# Patient Record
Sex: Female | Born: 1935 | Race: White | Hispanic: No | Smoking: Former smoker
Health system: Southern US, Community
[De-identification: ages and names within clinical notes are randomized; demographics above are authoritative.]

## PROBLEM LIST (undated history)

## (undated) DIAGNOSIS — I10 Essential (primary) hypertension: Secondary | ICD-10-CM

## (undated) DIAGNOSIS — I6529 Occlusion and stenosis of unspecified carotid artery: Secondary | ICD-10-CM

## (undated) DIAGNOSIS — R159 Full incontinence of feces: Secondary | ICD-10-CM

## (undated) DIAGNOSIS — H3530 Unspecified macular degeneration: Secondary | ICD-10-CM

## (undated) DIAGNOSIS — H409 Unspecified glaucoma: Secondary | ICD-10-CM

## (undated) DIAGNOSIS — D649 Anemia, unspecified: Secondary | ICD-10-CM

## (undated) DIAGNOSIS — I739 Peripheral vascular disease, unspecified: Secondary | ICD-10-CM

## (undated) DIAGNOSIS — H9190 Unspecified hearing loss, unspecified ear: Secondary | ICD-10-CM

## (undated) DIAGNOSIS — E785 Hyperlipidemia, unspecified: Secondary | ICD-10-CM

## (undated) DIAGNOSIS — M1990 Unspecified osteoarthritis, unspecified site: Secondary | ICD-10-CM

## (undated) DIAGNOSIS — M797 Fibromyalgia: Secondary | ICD-10-CM

## (undated) DIAGNOSIS — I639 Cerebral infarction, unspecified: Secondary | ICD-10-CM

## (undated) DIAGNOSIS — E039 Hypothyroidism, unspecified: Secondary | ICD-10-CM

## (undated) DIAGNOSIS — N390 Urinary tract infection, site not specified: Secondary | ICD-10-CM

## (undated) DIAGNOSIS — R351 Nocturia: Secondary | ICD-10-CM

## (undated) HISTORY — PX: APPENDECTOMY: SHX54

## (undated) HISTORY — PX: OTHER SURGICAL HISTORY: SHX169

## (undated) HISTORY — DX: Fibromyalgia: M79.7

## (undated) HISTORY — DX: Essential (primary) hypertension: I10

## (undated) HISTORY — PX: WRIST SURGERY: SHX841

## (undated) HISTORY — PX: TONSILLECTOMY: SUR1361

## (undated) HISTORY — DX: Anemia, unspecified: D64.9

## (undated) HISTORY — DX: Urinary tract infection, site not specified: N39.0

## (undated) HISTORY — DX: Cerebral infarction, unspecified: I63.9

## (undated) HISTORY — PX: CAROTID ENDARTERECTOMY: SUR193

## (undated) HISTORY — DX: Unspecified osteoarthritis, unspecified site: M19.90

## (undated) HISTORY — PX: ESOPHAGOGASTRODUODENOSCOPY: SHX1529

## (undated) HISTORY — DX: Unspecified glaucoma: H40.9

## (undated) HISTORY — DX: Unspecified hearing loss, unspecified ear: H91.90

## (undated) HISTORY — PX: COLONOSCOPY: SHX174

## (undated) HISTORY — DX: Hypothyroidism, unspecified: E03.9

## (undated) HISTORY — DX: Occlusion and stenosis of unspecified carotid artery: I65.29

## (undated) HISTORY — DX: Full incontinence of feces: R15.9

## (undated) HISTORY — DX: Unspecified macular degeneration: H35.30

## (undated) HISTORY — PX: TUBAL LIGATION: SHX77

---

## 2010-09-29 LAB — BASIC METABOLIC PANEL
Potassium: 4.3 mmol/L (ref 3.4–5.3)
Sodium: 140 mmol/L (ref 137–147)

## 2010-10-13 DIAGNOSIS — R072 Precordial pain: Secondary | ICD-10-CM

## 2010-10-14 DIAGNOSIS — R079 Chest pain, unspecified: Secondary | ICD-10-CM

## 2010-10-20 DIAGNOSIS — R072 Precordial pain: Secondary | ICD-10-CM

## 2010-10-24 LAB — CBC AND DIFFERENTIAL
HCT: 33 % — AB (ref 36–46)
Hemoglobin: 10.8 g/dL — AB (ref 12.0–16.0)
Platelets: 49 10*3/uL — AB (ref 150–399)

## 2010-10-24 LAB — HEPATIC FUNCTION PANEL: AST: 81 U/L — AB (ref 13–35)

## 2010-10-31 ENCOUNTER — Encounter (INDEPENDENT_AMBULATORY_CARE_PROVIDER_SITE_OTHER): Payer: Self-pay

## 2010-10-31 ENCOUNTER — Encounter (INDEPENDENT_AMBULATORY_CARE_PROVIDER_SITE_OTHER): Payer: Self-pay | Admitting: *Deleted

## 2010-11-09 ENCOUNTER — Ambulatory Visit (INDEPENDENT_AMBULATORY_CARE_PROVIDER_SITE_OTHER): Payer: Federal, State, Local not specified - PPO | Admitting: Internal Medicine

## 2010-11-28 ENCOUNTER — Encounter: Payer: Federal, State, Local not specified - PPO | Admitting: Cardiology

## 2011-08-24 ENCOUNTER — Other Ambulatory Visit: Payer: Self-pay | Admitting: Ophthalmology

## 2011-08-24 NOTE — H&P (Signed)
SUBJECTIVE:  Patient is having difficulty seeing. Right eye worse than left.  OBJECTIVE:   HISTORY OF PRESENT ILLNESS:  Onset: years  Duration:  years  Quality: worse  Location:  right > left  Intensity / Severity: moderate  Context / When:  just happened  Mental Status: Oriented to Time, Person, Place   REVIEW OF SYSTEMS:   HYPOTHYROID  GEN: Denies weight changes, appetite changes, unusual weakness, bleeding, fever, chills, recent trauma or infections  HEENT: Denies vision changes (except as noted above), hearing changes, epistaxis, unusual sneezing, sore throat, swallowing difficulties, ear pain, or facial pain.  NECK: Denies neck pain swelling or stiffness.  LUNGS: Denies cough, dyspnea, orthopnea, or hemoptosis.  HEART: Denies palpitations or chest pain.  ABD: Denies abdomen pain, eructation, nausea, vomiting, hematemesis, diarrhea, constipation, hematochezia, melena, acholic stools, or flatulence.  GENT: Denies recent dysruia, urine frequency, urine hesitancy, urine urgency, urine flow-slow, urine retention, nocturia, polyuria, dark urine, or incontinence.  BJE: Denies arthralgia, joint stiffness, back pain, muscle cramps, or myalgia. FIBROMYALGIA  SKIN: Denies rashes, lesions, anhidrosis, bruising, or pruritus.  NEURO: Denies memory loss, disorientation, syncope, diplopia, dizziness, vertigo, clumsiness, paresthesias or cephalgia.     Vision:   OD: Acuity: With Correction 20/70 PinHole: No Improvement  OS: Acuity: With Correction 20/50 PinHole: +2    Habitual Prescription: OD: none OS:  AutoRefraction: OD:  +4.50 + 0.75 x 10         43.50  44.00 OS:  +2.25 + 2.50 x 161       43.75  44.25  Refraction;;08/24/2011 15:49  OD: +4.25 sph...............................20/40  OS: +2.00 1.50 x 165.....................20/40   Refraction:11.15.2012 OD: +3.75+0.75x015...20/25 OS  +2.50+1.00x165...20/30 Ass:+3.00  [place] Visual Fields: OD: Visual  Fields:  Arcuate Defect Stable OS: Visual Fields: SUperior Arcuate Defect Stable  Lids and Lashes:    Motility: Full     SLIT-LAMP EXAMINATION  Conjunctiva OD: Conjunctiva:  1+ Injection  OS: Conjunctiva:  1+ Injection   Pupils: OD: Pupils:     millimeters (-) Page Spiro OS: Pupils:    millimeters (-) Page Spiro  Iris: OD: Iris:  Grey with peripapillary  transillumination defects OS: Iris:  Grey with peripapillary transillumination defects  Cornea: OD:  Cornea:  OS: Cornea:   Anterior Chamber: OD: Anterior Chamber Deep and Quiet  OS: Anterior Chamber Deep and Quiet   Lens: OD: 3+ Nuclear Sclerotic  OS: 1+ Nuclear Sclerotic  +1 Posterior Sclerotic Subcapsular Cataract    Intra-Ocular Pressures in mm of Mercury:  Target:14/15 Today: OD:14 OS:14 Time:07/18/2011 11:30   Central Corneal Thickness:   Gonio: OD: Gonio  OS: Gonio     Dilation :   Condensing Lens(es) Used:   Vitreous: OD: Vitreous:  Clear OS: Vitreous:  Clear  Optic Discs: OD: 80% Cupping  OS: Optic Discs   Macula: OD: Macula: Clear Pigment Mottling OS: Macula: Clear  Retina And Vessels: OD: Retina and Vessels Clear OS: Retina and Vessels Clear  Periphery: OD: Periphery Clear OS: Periphery  Clear     HEALTH AND PHYSICAL  Blood Pressure:  140/64  Pulse:  70  Respiration:  20  GENERAL: No acute distress  HEENT: MM's WNL, TM's normal, PERLA, Conjunctiva WNL, Fundi  As Before  note above  NECK: Full ROM, no adenopathy or masses, Thyroid WNL  LUNGS: Clear to auscultation, equal BS  HEART: RR&R, no murmur, gallop, or rub, not enlarged  ABD: Normal  GENT: deferred  BJE: normal  NEURO: CN II through XII intact  SKIN: normal  STUDIES:  A-Scan      ASSESSMENT: Vision worse OD Discussed possibility of  Age-Related Macular Degeneration affecting vision after Cataracts #366.9.  removal.  Risks and Benefits reviewed with patient and she agreed to proceed.     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 OS  PLAN:  Dilation today  phacoemulsification with intraocular lens implant OD

## 2011-08-25 ENCOUNTER — Encounter (HOSPITAL_COMMUNITY): Payer: Self-pay | Admitting: Pharmacy Technician

## 2011-08-25 ENCOUNTER — Other Ambulatory Visit: Payer: Self-pay | Admitting: Ophthalmology

## 2011-08-30 ENCOUNTER — Ambulatory Visit (HOSPITAL_COMMUNITY)
Admission: RE | Admit: 2011-08-30 | Payer: Federal, State, Local not specified - PPO | Source: Ambulatory Visit | Admitting: Ophthalmology

## 2011-08-30 ENCOUNTER — Encounter (HOSPITAL_COMMUNITY): Admission: RE | Payer: Self-pay | Source: Ambulatory Visit

## 2011-08-30 SURGERY — PHACOEMULSIFICATION, CATARACT, WITH IOL INSERTION
Anesthesia: Monitor Anesthesia Care | Laterality: Right

## 2011-09-22 ENCOUNTER — Encounter (INDEPENDENT_AMBULATORY_CARE_PROVIDER_SITE_OTHER): Payer: Federal, State, Local not specified - PPO | Admitting: Ophthalmology

## 2011-10-12 ENCOUNTER — Encounter (INDEPENDENT_AMBULATORY_CARE_PROVIDER_SITE_OTHER): Payer: Federal, State, Local not specified - PPO | Admitting: Ophthalmology

## 2011-10-12 DIAGNOSIS — H3530 Unspecified macular degeneration: Secondary | ICD-10-CM

## 2011-10-12 DIAGNOSIS — H43819 Vitreous degeneration, unspecified eye: Secondary | ICD-10-CM

## 2011-10-12 DIAGNOSIS — H353290 Exudative age-related macular degeneration, unspecified eye, stage unspecified: Secondary | ICD-10-CM

## 2011-10-12 DIAGNOSIS — I10 Essential (primary) hypertension: Secondary | ICD-10-CM

## 2011-10-12 DIAGNOSIS — H35039 Hypertensive retinopathy, unspecified eye: Secondary | ICD-10-CM

## 2011-10-13 ENCOUNTER — Encounter (INDEPENDENT_AMBULATORY_CARE_PROVIDER_SITE_OTHER): Payer: Federal, State, Local not specified - PPO | Admitting: Ophthalmology

## 2011-10-13 DIAGNOSIS — H3530 Unspecified macular degeneration: Secondary | ICD-10-CM

## 2011-10-13 DIAGNOSIS — H353290 Exudative age-related macular degeneration, unspecified eye, stage unspecified: Secondary | ICD-10-CM

## 2011-10-17 ENCOUNTER — Ambulatory Visit (INDEPENDENT_AMBULATORY_CARE_PROVIDER_SITE_OTHER): Payer: Federal, State, Local not specified - PPO | Admitting: Ophthalmology

## 2011-10-17 DIAGNOSIS — I10 Essential (primary) hypertension: Secondary | ICD-10-CM

## 2011-10-17 DIAGNOSIS — H353290 Exudative age-related macular degeneration, unspecified eye, stage unspecified: Secondary | ICD-10-CM

## 2011-10-17 DIAGNOSIS — H3530 Unspecified macular degeneration: Secondary | ICD-10-CM

## 2011-10-17 DIAGNOSIS — H2510 Age-related nuclear cataract, unspecified eye: Secondary | ICD-10-CM

## 2011-10-17 DIAGNOSIS — H43819 Vitreous degeneration, unspecified eye: Secondary | ICD-10-CM

## 2011-10-17 DIAGNOSIS — H35039 Hypertensive retinopathy, unspecified eye: Secondary | ICD-10-CM

## 2011-11-10 ENCOUNTER — Encounter (INDEPENDENT_AMBULATORY_CARE_PROVIDER_SITE_OTHER): Payer: Federal, State, Local not specified - PPO | Admitting: Ophthalmology

## 2011-11-15 ENCOUNTER — Encounter (INDEPENDENT_AMBULATORY_CARE_PROVIDER_SITE_OTHER): Payer: Federal, State, Local not specified - PPO | Admitting: Ophthalmology

## 2011-11-15 DIAGNOSIS — H35039 Hypertensive retinopathy, unspecified eye: Secondary | ICD-10-CM

## 2011-11-15 DIAGNOSIS — I10 Essential (primary) hypertension: Secondary | ICD-10-CM

## 2011-11-15 DIAGNOSIS — H3530 Unspecified macular degeneration: Secondary | ICD-10-CM

## 2011-11-15 DIAGNOSIS — H353290 Exudative age-related macular degeneration, unspecified eye, stage unspecified: Secondary | ICD-10-CM

## 2011-11-15 DIAGNOSIS — H43819 Vitreous degeneration, unspecified eye: Secondary | ICD-10-CM

## 2011-12-20 ENCOUNTER — Encounter (INDEPENDENT_AMBULATORY_CARE_PROVIDER_SITE_OTHER): Payer: Federal, State, Local not specified - PPO | Admitting: Ophthalmology

## 2011-12-20 DIAGNOSIS — H3530 Unspecified macular degeneration: Secondary | ICD-10-CM

## 2011-12-20 DIAGNOSIS — I10 Essential (primary) hypertension: Secondary | ICD-10-CM

## 2011-12-20 DIAGNOSIS — H35039 Hypertensive retinopathy, unspecified eye: Secondary | ICD-10-CM

## 2011-12-20 DIAGNOSIS — H353290 Exudative age-related macular degeneration, unspecified eye, stage unspecified: Secondary | ICD-10-CM

## 2012-02-07 ENCOUNTER — Encounter (INDEPENDENT_AMBULATORY_CARE_PROVIDER_SITE_OTHER): Payer: Federal, State, Local not specified - PPO | Admitting: Ophthalmology

## 2012-02-07 DIAGNOSIS — H43819 Vitreous degeneration, unspecified eye: Secondary | ICD-10-CM

## 2012-02-07 DIAGNOSIS — H3530 Unspecified macular degeneration: Secondary | ICD-10-CM

## 2012-02-07 DIAGNOSIS — H353290 Exudative age-related macular degeneration, unspecified eye, stage unspecified: Secondary | ICD-10-CM

## 2012-02-07 DIAGNOSIS — I10 Essential (primary) hypertension: Secondary | ICD-10-CM

## 2012-02-07 DIAGNOSIS — H35039 Hypertensive retinopathy, unspecified eye: Secondary | ICD-10-CM

## 2012-02-08 NOTE — Addendum Note (Signed)
Addended by: Blake Divine on: 02/08/2012 08:19 PM   Modules accepted: Orders

## 2012-04-03 ENCOUNTER — Encounter (INDEPENDENT_AMBULATORY_CARE_PROVIDER_SITE_OTHER): Payer: Federal, State, Local not specified - PPO | Admitting: Ophthalmology

## 2012-04-05 ENCOUNTER — Encounter (INDEPENDENT_AMBULATORY_CARE_PROVIDER_SITE_OTHER): Payer: Federal, State, Local not specified - PPO | Admitting: Ophthalmology

## 2012-04-05 DIAGNOSIS — H35039 Hypertensive retinopathy, unspecified eye: Secondary | ICD-10-CM

## 2012-04-05 DIAGNOSIS — H3530 Unspecified macular degeneration: Secondary | ICD-10-CM

## 2012-04-05 DIAGNOSIS — I10 Essential (primary) hypertension: Secondary | ICD-10-CM

## 2012-04-05 DIAGNOSIS — H353290 Exudative age-related macular degeneration, unspecified eye, stage unspecified: Secondary | ICD-10-CM

## 2012-05-01 ENCOUNTER — Other Ambulatory Visit (INDEPENDENT_AMBULATORY_CARE_PROVIDER_SITE_OTHER): Payer: Federal, State, Local not specified - PPO | Admitting: Ophthalmology

## 2012-05-01 DIAGNOSIS — H35059 Retinal neovascularization, unspecified, unspecified eye: Secondary | ICD-10-CM

## 2012-05-08 ENCOUNTER — Encounter (INDEPENDENT_AMBULATORY_CARE_PROVIDER_SITE_OTHER): Payer: Federal, State, Local not specified - PPO | Admitting: Ophthalmology

## 2012-05-08 DIAGNOSIS — H35059 Retinal neovascularization, unspecified, unspecified eye: Secondary | ICD-10-CM

## 2012-05-29 ENCOUNTER — Encounter (INDEPENDENT_AMBULATORY_CARE_PROVIDER_SITE_OTHER): Payer: BC Managed Care – PPO | Admitting: Ophthalmology

## 2012-05-29 DIAGNOSIS — H35039 Hypertensive retinopathy, unspecified eye: Secondary | ICD-10-CM

## 2012-05-29 DIAGNOSIS — I10 Essential (primary) hypertension: Secondary | ICD-10-CM

## 2012-05-29 DIAGNOSIS — H35059 Retinal neovascularization, unspecified, unspecified eye: Secondary | ICD-10-CM

## 2012-05-29 DIAGNOSIS — H2510 Age-related nuclear cataract, unspecified eye: Secondary | ICD-10-CM

## 2012-05-29 DIAGNOSIS — H353290 Exudative age-related macular degeneration, unspecified eye, stage unspecified: Secondary | ICD-10-CM

## 2012-05-29 DIAGNOSIS — H43819 Vitreous degeneration, unspecified eye: Secondary | ICD-10-CM

## 2012-06-07 ENCOUNTER — Encounter (INDEPENDENT_AMBULATORY_CARE_PROVIDER_SITE_OTHER): Payer: BC Managed Care – PPO | Admitting: Ophthalmology

## 2012-06-07 DIAGNOSIS — H43819 Vitreous degeneration, unspecified eye: Secondary | ICD-10-CM

## 2012-06-07 DIAGNOSIS — I10 Essential (primary) hypertension: Secondary | ICD-10-CM

## 2012-06-07 DIAGNOSIS — H35039 Hypertensive retinopathy, unspecified eye: Secondary | ICD-10-CM

## 2012-06-07 DIAGNOSIS — H353290 Exudative age-related macular degeneration, unspecified eye, stage unspecified: Secondary | ICD-10-CM

## 2012-07-24 ENCOUNTER — Encounter (INDEPENDENT_AMBULATORY_CARE_PROVIDER_SITE_OTHER): Payer: BC Managed Care – PPO | Admitting: Ophthalmology

## 2012-07-24 DIAGNOSIS — H353290 Exudative age-related macular degeneration, unspecified eye, stage unspecified: Secondary | ICD-10-CM

## 2012-07-24 DIAGNOSIS — H43819 Vitreous degeneration, unspecified eye: Secondary | ICD-10-CM

## 2012-07-24 DIAGNOSIS — H2510 Age-related nuclear cataract, unspecified eye: Secondary | ICD-10-CM

## 2012-09-18 ENCOUNTER — Encounter (INDEPENDENT_AMBULATORY_CARE_PROVIDER_SITE_OTHER): Payer: BC Managed Care – PPO | Admitting: Ophthalmology

## 2012-09-18 DIAGNOSIS — H353290 Exudative age-related macular degeneration, unspecified eye, stage unspecified: Secondary | ICD-10-CM

## 2012-09-18 DIAGNOSIS — H2510 Age-related nuclear cataract, unspecified eye: Secondary | ICD-10-CM

## 2012-09-18 DIAGNOSIS — H43819 Vitreous degeneration, unspecified eye: Secondary | ICD-10-CM

## 2012-09-20 ENCOUNTER — Encounter (INDEPENDENT_AMBULATORY_CARE_PROVIDER_SITE_OTHER): Payer: BC Managed Care – PPO | Admitting: Ophthalmology

## 2012-11-13 ENCOUNTER — Encounter (INDEPENDENT_AMBULATORY_CARE_PROVIDER_SITE_OTHER): Payer: BC Managed Care – PPO | Admitting: Ophthalmology

## 2012-11-13 DIAGNOSIS — H2510 Age-related nuclear cataract, unspecified eye: Secondary | ICD-10-CM

## 2012-11-13 DIAGNOSIS — H353290 Exudative age-related macular degeneration, unspecified eye, stage unspecified: Secondary | ICD-10-CM

## 2012-11-13 DIAGNOSIS — H43819 Vitreous degeneration, unspecified eye: Secondary | ICD-10-CM

## 2013-01-15 ENCOUNTER — Encounter (INDEPENDENT_AMBULATORY_CARE_PROVIDER_SITE_OTHER): Payer: BC Managed Care – PPO | Admitting: Ophthalmology

## 2013-01-15 DIAGNOSIS — E11319 Type 2 diabetes mellitus with unspecified diabetic retinopathy without macular edema: Secondary | ICD-10-CM

## 2013-01-15 DIAGNOSIS — H353290 Exudative age-related macular degeneration, unspecified eye, stage unspecified: Secondary | ICD-10-CM

## 2013-01-15 DIAGNOSIS — E1139 Type 2 diabetes mellitus with other diabetic ophthalmic complication: Secondary | ICD-10-CM

## 2013-01-15 DIAGNOSIS — H43819 Vitreous degeneration, unspecified eye: Secondary | ICD-10-CM

## 2013-03-19 ENCOUNTER — Encounter (INDEPENDENT_AMBULATORY_CARE_PROVIDER_SITE_OTHER): Payer: BC Managed Care – PPO | Admitting: Ophthalmology

## 2013-03-19 DIAGNOSIS — H353290 Exudative age-related macular degeneration, unspecified eye, stage unspecified: Secondary | ICD-10-CM

## 2013-03-19 DIAGNOSIS — E1139 Type 2 diabetes mellitus with other diabetic ophthalmic complication: Secondary | ICD-10-CM

## 2013-03-19 DIAGNOSIS — H43819 Vitreous degeneration, unspecified eye: Secondary | ICD-10-CM

## 2013-03-19 DIAGNOSIS — I10 Essential (primary) hypertension: Secondary | ICD-10-CM

## 2013-03-19 DIAGNOSIS — E11319 Type 2 diabetes mellitus with unspecified diabetic retinopathy without macular edema: Secondary | ICD-10-CM

## 2013-03-19 DIAGNOSIS — H35039 Hypertensive retinopathy, unspecified eye: Secondary | ICD-10-CM

## 2013-03-19 DIAGNOSIS — E1165 Type 2 diabetes mellitus with hyperglycemia: Secondary | ICD-10-CM

## 2013-05-21 ENCOUNTER — Encounter (INDEPENDENT_AMBULATORY_CARE_PROVIDER_SITE_OTHER): Payer: BC Managed Care – PPO | Admitting: Ophthalmology

## 2013-05-21 DIAGNOSIS — H353290 Exudative age-related macular degeneration, unspecified eye, stage unspecified: Secondary | ICD-10-CM

## 2013-05-21 DIAGNOSIS — H35039 Hypertensive retinopathy, unspecified eye: Secondary | ICD-10-CM

## 2013-05-21 DIAGNOSIS — H43819 Vitreous degeneration, unspecified eye: Secondary | ICD-10-CM

## 2013-05-21 DIAGNOSIS — E11319 Type 2 diabetes mellitus with unspecified diabetic retinopathy without macular edema: Secondary | ICD-10-CM

## 2013-05-21 DIAGNOSIS — E1165 Type 2 diabetes mellitus with hyperglycemia: Secondary | ICD-10-CM

## 2013-05-21 DIAGNOSIS — E1139 Type 2 diabetes mellitus with other diabetic ophthalmic complication: Secondary | ICD-10-CM

## 2013-05-21 DIAGNOSIS — I10 Essential (primary) hypertension: Secondary | ICD-10-CM

## 2013-07-30 ENCOUNTER — Encounter (INDEPENDENT_AMBULATORY_CARE_PROVIDER_SITE_OTHER): Payer: BC Managed Care – PPO | Admitting: Ophthalmology

## 2013-07-30 DIAGNOSIS — E1139 Type 2 diabetes mellitus with other diabetic ophthalmic complication: Secondary | ICD-10-CM

## 2013-07-30 DIAGNOSIS — H43819 Vitreous degeneration, unspecified eye: Secondary | ICD-10-CM

## 2013-07-30 DIAGNOSIS — I10 Essential (primary) hypertension: Secondary | ICD-10-CM

## 2013-07-30 DIAGNOSIS — E11319 Type 2 diabetes mellitus with unspecified diabetic retinopathy without macular edema: Secondary | ICD-10-CM

## 2013-07-30 DIAGNOSIS — H35039 Hypertensive retinopathy, unspecified eye: Secondary | ICD-10-CM

## 2013-07-30 DIAGNOSIS — H353290 Exudative age-related macular degeneration, unspecified eye, stage unspecified: Secondary | ICD-10-CM

## 2013-07-30 DIAGNOSIS — E1165 Type 2 diabetes mellitus with hyperglycemia: Secondary | ICD-10-CM

## 2013-09-10 ENCOUNTER — Encounter (INDEPENDENT_AMBULATORY_CARE_PROVIDER_SITE_OTHER): Payer: BC Managed Care – PPO | Admitting: Ophthalmology

## 2013-09-10 DIAGNOSIS — I10 Essential (primary) hypertension: Secondary | ICD-10-CM

## 2013-09-10 DIAGNOSIS — H43819 Vitreous degeneration, unspecified eye: Secondary | ICD-10-CM

## 2013-09-10 DIAGNOSIS — H353290 Exudative age-related macular degeneration, unspecified eye, stage unspecified: Secondary | ICD-10-CM

## 2013-09-10 DIAGNOSIS — H35039 Hypertensive retinopathy, unspecified eye: Secondary | ICD-10-CM

## 2013-10-04 HISTORY — PX: TOTAL HIP ARTHROPLASTY: SHX124

## 2013-10-16 ENCOUNTER — Encounter: Payer: Self-pay | Admitting: Physical Medicine and Rehabilitation

## 2013-10-16 ENCOUNTER — Other Ambulatory Visit: Payer: Self-pay | Admitting: Physical Medicine and Rehabilitation

## 2013-10-17 ENCOUNTER — Encounter (HOSPITAL_COMMUNITY): Payer: Self-pay | Admitting: Physical Medicine and Rehabilitation

## 2013-10-17 ENCOUNTER — Inpatient Hospital Stay (HOSPITAL_COMMUNITY)
Admission: RE | Admit: 2013-10-17 | Discharge: 2013-10-30 | DRG: 945 | Disposition: A | Discharge status: Home Health | Payer: Medicare Other | Source: Other Acute Inpatient Hospital | Attending: Physical Medicine & Rehabilitation | Admitting: Physical Medicine & Rehabilitation

## 2013-10-17 ENCOUNTER — Other Ambulatory Visit: Payer: Self-pay | Admitting: Physical Medicine and Rehabilitation

## 2013-10-17 ENCOUNTER — Encounter: Payer: Self-pay | Admitting: *Deleted

## 2013-10-17 DIAGNOSIS — Z96649 Presence of unspecified artificial hip joint: Secondary | ICD-10-CM

## 2013-10-17 DIAGNOSIS — H409 Unspecified glaucoma: Secondary | ICD-10-CM | POA: Diagnosis present

## 2013-10-17 DIAGNOSIS — W108XXA Fall (on) (from) other stairs and steps, initial encounter: Secondary | ICD-10-CM | POA: Diagnosis present

## 2013-10-17 DIAGNOSIS — S72309A Unspecified fracture of shaft of unspecified femur, initial encounter for closed fracture: Secondary | ICD-10-CM | POA: Diagnosis present

## 2013-10-17 DIAGNOSIS — E871 Hypo-osmolality and hyponatremia: Secondary | ICD-10-CM | POA: Diagnosis present

## 2013-10-17 DIAGNOSIS — Z79899 Other long term (current) drug therapy: Secondary | ICD-10-CM

## 2013-10-17 DIAGNOSIS — Z7982 Long term (current) use of aspirin: Secondary | ICD-10-CM | POA: Diagnosis not present

## 2013-10-17 DIAGNOSIS — Z885 Allergy status to narcotic agent status: Secondary | ICD-10-CM | POA: Diagnosis not present

## 2013-10-17 DIAGNOSIS — W010XXA Fall on same level from slipping, tripping and stumbling without subsequent striking against object, initial encounter: Secondary | ICD-10-CM | POA: Diagnosis present

## 2013-10-17 DIAGNOSIS — E876 Hypokalemia: Secondary | ICD-10-CM | POA: Diagnosis present

## 2013-10-17 DIAGNOSIS — M069 Rheumatoid arthritis, unspecified: Secondary | ICD-10-CM | POA: Diagnosis present

## 2013-10-17 DIAGNOSIS — IMO0001 Reserved for inherently not codable concepts without codable children: Secondary | ICD-10-CM | POA: Diagnosis present

## 2013-10-17 DIAGNOSIS — Y92009 Unspecified place in unspecified non-institutional (private) residence as the place of occurrence of the external cause: Secondary | ICD-10-CM | POA: Diagnosis not present

## 2013-10-17 DIAGNOSIS — S52509A Unspecified fracture of the lower end of unspecified radius, initial encounter for closed fracture: Secondary | ICD-10-CM | POA: Diagnosis present

## 2013-10-17 DIAGNOSIS — H3530 Unspecified macular degeneration: Secondary | ICD-10-CM | POA: Diagnosis present

## 2013-10-17 DIAGNOSIS — S72009A Fracture of unspecified part of neck of unspecified femur, initial encounter for closed fracture: Secondary | ICD-10-CM

## 2013-10-17 DIAGNOSIS — S52609A Unspecified fracture of lower end of unspecified ulna, initial encounter for closed fracture: Secondary | ICD-10-CM

## 2013-10-17 DIAGNOSIS — E039 Hypothyroidism, unspecified: Secondary | ICD-10-CM | POA: Diagnosis present

## 2013-10-17 DIAGNOSIS — K5900 Constipation, unspecified: Secondary | ICD-10-CM | POA: Diagnosis not present

## 2013-10-17 DIAGNOSIS — Z5189 Encounter for other specified aftercare: Secondary | ICD-10-CM | POA: Diagnosis present

## 2013-10-17 DIAGNOSIS — D649 Anemia, unspecified: Secondary | ICD-10-CM | POA: Diagnosis present

## 2013-10-17 DIAGNOSIS — I10 Essential (primary) hypertension: Secondary | ICD-10-CM | POA: Diagnosis present

## 2013-10-17 DIAGNOSIS — D62 Acute posthemorrhagic anemia: Secondary | ICD-10-CM | POA: Diagnosis present

## 2013-10-17 DIAGNOSIS — N390 Urinary tract infection, site not specified: Secondary | ICD-10-CM | POA: Diagnosis present

## 2013-10-17 DIAGNOSIS — R0902 Hypoxemia: Secondary | ICD-10-CM | POA: Diagnosis present

## 2013-10-17 DIAGNOSIS — Z9851 Tubal ligation status: Secondary | ICD-10-CM

## 2013-10-17 DIAGNOSIS — M797 Fibromyalgia: Secondary | ICD-10-CM | POA: Diagnosis present

## 2013-10-17 HISTORY — DX: Fracture of unspecified part of neck of unspecified femur, initial encounter for closed fracture: S72.009A

## 2013-10-17 LAB — URINALYSIS, ROUTINE W REFLEX MICROSCOPIC
Bilirubin Urine: NEGATIVE
GLUCOSE, UA: NEGATIVE mg/dL
KETONES UR: NEGATIVE mg/dL
Leukocytes, UA: NEGATIVE
Nitrite: NEGATIVE
PROTEIN: NEGATIVE mg/dL
Specific Gravity, Urine: 1.006 (ref 1.005–1.030)
Urobilinogen, UA: 1 mg/dL (ref 0.0–1.0)
pH: 8 (ref 5.0–8.0)

## 2013-10-17 LAB — URINE MICROSCOPIC-ADD ON

## 2013-10-17 MED ORDER — CALCIUM CARBONATE-VITAMIN D 500-200 MG-UNIT PO TABS
1.0000 | ORAL_TABLET | Freq: Two times a day (BID) | ORAL | Status: DC
  Administered 2013-10-17 – 2013-10-30 (×26): 1 via ORAL
  Filled 2013-10-17 (×28): qty 1

## 2013-10-17 MED ORDER — LEVOTHYROXINE SODIUM 88 MCG PO TABS
88.0000 ug | ORAL_TABLET | Freq: Every day | ORAL | Status: DC
  Administered 2013-10-18: 88 ug via ORAL
  Filled 2013-10-17 (×2): qty 1

## 2013-10-17 MED ORDER — ENSURE PUDDING PO PUDG
1.0000 | Freq: Two times a day (BID) | ORAL | Status: DC
  Administered 2013-10-18 – 2013-10-30 (×16): 1 via ORAL

## 2013-10-17 MED ORDER — ACETAMINOPHEN 325 MG PO TABS
325.0000 mg | ORAL_TABLET | ORAL | Status: DC | PRN
  Administered 2013-10-19: 650 mg via ORAL
  Filled 2013-10-17: qty 2

## 2013-10-17 MED ORDER — PROCHLORPERAZINE 25 MG RE SUPP
12.5000 mg | Freq: Four times a day (QID) | RECTAL | Status: DC | PRN
  Filled 2013-10-17: qty 1

## 2013-10-17 MED ORDER — APIXABAN 5 MG PO TABS: 5.0000 mg | ORAL_TABLET | Freq: Two times a day (BID) | ORAL | Status: DC

## 2013-10-17 MED ORDER — TRAMADOL HCL 50 MG PO TABS
50.0000 mg | ORAL_TABLET | Freq: Four times a day (QID) | ORAL | Status: DC | PRN
  Administered 2013-10-18 – 2013-10-26 (×7): 50 mg via ORAL
  Filled 2013-10-17 (×7): qty 1

## 2013-10-17 MED ORDER — BISACODYL 10 MG RE SUPP: 10.0000 mg | Freq: Every day | RECTAL | Status: DC | PRN

## 2013-10-17 MED ORDER — POLYETHYLENE GLYCOL 3350 17 G PO PACK
17.0000 g | PACK | Freq: Every day | ORAL | Status: DC
  Administered 2013-10-18 – 2013-10-24 (×5): 17 g via ORAL
  Filled 2013-10-17 (×14): qty 1

## 2013-10-17 MED ORDER — FLEET ENEMA 7-19 GM/118ML RE ENEM
1.0000 | ENEMA | Freq: Once | RECTAL | Status: AC | PRN
Start: 2013-10-17 — End: 2013-10-17

## 2013-10-17 MED ORDER — LATANOPROST 0.005 % OP SOLN
1.0000 [drp] | Freq: Every day | OPHTHALMIC | Status: DC
  Administered 2013-10-17 – 2013-10-28 (×12): 1 [drp] via OPHTHALMIC
  Filled 2013-10-17: qty 2.5

## 2013-10-17 MED ORDER — PROCHLORPERAZINE EDISYLATE 5 MG/ML IJ SOLN
5.0000 mg | Freq: Four times a day (QID) | INTRAMUSCULAR | Status: DC | PRN
  Filled 2013-10-17: qty 2

## 2013-10-17 MED ORDER — AMLODIPINE BESYLATE 5 MG PO TABS
5.0000 mg | ORAL_TABLET | Freq: Every day | ORAL | Status: DC
  Filled 2013-10-17 (×2): qty 1

## 2013-10-17 MED ORDER — TRAZODONE HCL 50 MG PO TABS: 25.0000 mg | ORAL_TABLET | Freq: Every evening | ORAL | Status: DC | PRN

## 2013-10-17 MED ORDER — GUAIFENESIN-DM 100-10 MG/5ML PO SYRP: 5.0000 mL | ORAL_SOLUTION | Freq: Four times a day (QID) | ORAL | Status: DC | PRN

## 2013-10-17 MED ORDER — GABAPENTIN 100 MG PO CAPS
100.0000 mg | ORAL_CAPSULE | Freq: Two times a day (BID) | ORAL | Status: DC
  Administered 2013-10-17 – 2013-10-26 (×18): 100 mg via ORAL
  Filled 2013-10-17 (×20): qty 1

## 2013-10-17 MED ORDER — METHOCARBAMOL 500 MG PO TABS
500.0000 mg | ORAL_TABLET | Freq: Four times a day (QID) | ORAL | Status: DC | PRN
  Administered 2013-10-21 – 2013-10-23 (×4): 500 mg via ORAL
  Filled 2013-10-17 (×4): qty 1

## 2013-10-17 MED ORDER — OXYCODONE HCL 5 MG PO TABS
5.0000 mg | ORAL_TABLET | ORAL | Status: DC | PRN
  Administered 2013-10-17: 10 mg via ORAL
  Administered 2013-10-20 – 2013-10-21 (×2): 5 mg via ORAL
  Administered 2013-10-21: 10 mg via ORAL
  Administered 2013-10-22: 5 mg via ORAL
  Administered 2013-10-22: 10 mg via ORAL
  Administered 2013-10-23: 5 mg via ORAL
  Administered 2013-10-24 (×2): 10 mg via ORAL
  Administered 2013-10-26: 5 mg via ORAL
  Administered 2013-10-26 (×2): 10 mg via ORAL
  Administered 2013-10-27 – 2013-10-28 (×2): 5 mg via ORAL
  Filled 2013-10-17 (×4): qty 1
  Filled 2013-10-17 (×4): qty 2
  Filled 2013-10-17: qty 1
  Filled 2013-10-17 (×3): qty 2
  Filled 2013-10-17 (×2): qty 1

## 2013-10-17 MED ORDER — ALUM & MAG HYDROXIDE-SIMETH 200-200-20 MG/5ML PO SUSP
30.0000 mL | ORAL | Status: DC | PRN
  Administered 2013-10-23: 30 mL via ORAL
  Filled 2013-10-17: qty 30

## 2013-10-17 MED ORDER — DIPHENHYDRAMINE HCL 12.5 MG/5ML PO ELIX: 12.5000 mg | ORAL_SOLUTION | Freq: Four times a day (QID) | ORAL | Status: DC | PRN

## 2013-10-17 MED ORDER — APIXABAN 2.5 MG PO TABS
2.5000 mg | ORAL_TABLET | Freq: Two times a day (BID) | ORAL | Status: DC
  Administered 2013-10-17 – 2013-10-30 (×26): 2.5 mg via ORAL
  Filled 2013-10-17 (×28): qty 1

## 2013-10-17 MED ORDER — POLYSACCHARIDE IRON COMPLEX 150 MG PO CAPS
150.0000 mg | ORAL_CAPSULE | Freq: Two times a day (BID) | ORAL | Status: DC
  Administered 2013-10-17 – 2013-10-30 (×25): 150 mg via ORAL
  Filled 2013-10-17 (×27): qty 1

## 2013-10-17 MED ORDER — CALCIUM CITRATE-VITAMIN D 500-400 MG-UNIT PO CHEW
1.0000 | CHEWABLE_TABLET | Freq: Two times a day (BID) | ORAL | Status: DC
  Filled 2013-10-17 (×2): qty 1

## 2013-10-17 MED ORDER — SENNOSIDES-DOCUSATE SODIUM 8.6-50 MG PO TABS
1.0000 | ORAL_TABLET | Freq: Every day | ORAL | Status: DC
  Administered 2013-10-17 – 2013-10-29 (×10): 1 via ORAL
  Filled 2013-10-17 (×11): qty 1

## 2013-10-17 MED ORDER — PROCHLORPERAZINE MALEATE 5 MG PO TABS
5.0000 mg | ORAL_TABLET | Freq: Four times a day (QID) | ORAL | Status: DC | PRN
  Filled 2013-10-17: qty 2

## 2013-10-17 MED ORDER — BENAZEPRIL HCL 10 MG PO TABS
10.0000 mg | ORAL_TABLET | Freq: Every day | ORAL | Status: DC
  Filled 2013-10-17 (×2): qty 1

## 2013-10-17 NOTE — PMR Pre-admission (Signed)
[facility] PMR Admission Coordinator Pre-Admission Assessment  Patient: Jenna Ramos is an 80 y.o., female MRN: [id number] DOB: 1929-02-03 Height: 5\' 3"  (160 cm) Weight: 59.421 kg (131 lb)  Insurance Information   PRIMARY: Medicare Part A only      Policy#: [id number]      Subscriber: self Pre-Cert#: verified in [employer]: retired [employer] Date: 04-07-95 Deduct: $1260      Out of Pocket Max: none      Life Max: unlimited CIR: 100%      SNF: 100% days 1-20; 80% days 21-100 (100 day visit limit) Outpatient: No outpt benefits through Medicare Part A but covered by [facility]:  Home Health: 100%      Co-Pay: none, no visit limits DME: covered by [employer]     Co-Pay:  Providers: in network  SECONDARY: [place]      Policy#: [id number]      Subscriber: self Benefits:  Phone #: [phone]       Emergency Contact Information Contact Information   Name Relation Home Work Mobile   [place] Other   [phone]   [place], Zagal   [phone]      Current Medical History  Patient Admitting Diagnosis: Displaced left femoral neck fracture, left distal radius-ulna fracture, left femoral shaft fracture  History of Present Illness: Jenna Ramos is an 80 year old female with history of HTN, RA, fibromyalgia, macular degeneration who caught her toe on the edge of a stair, tripped and fell on her steps on 10/12/13 with onset of severe left hip and left wrist pain and inability to ambulate. She was taken to [facility] ED and workup revealed displaced left femoral neck fracture and distal left radius and ulna fracture. She underwent left hip bipolar hemi and ORIF left wrist by Dr. Derald Macleod on 10/14/13. She was found to have femoral shaft fracture and underwent ORIF on 10/15/13. Post op with posterior hip precautions and is TTWB LLE.  Post op labs show hypokalemia and hyponatremia treated with IVF as well as ABLA that is  being monitored.  Inpatient rehab was recommended by physical therapy.  Patient's medical record from [facility] has been reviewed by the rehabilitation admission coordinator and physician.  Past Medical History  Past Medical History  Diagnosis Date  . Rectal incontinence   . Hypertension   . Arthritis   . Hypothyroidism   . Chronic rheumatic arthritis   . Anemia   . Fibromyalgia   . Glaucoma   . Macular degeneration   . HOH (hard of hearing)     Family History  family history includes Lung disease in her sister; Prostate cancer in her father.  Prior Rehab/Hospitalizations: none   Current Medications See MAR  Patients Current Diet:   Regular diet  Precautions / Restrictions Precautions Precautions: Posterior Hip;Fall (TTWB L LE and cast on L UE from L Wrist ORIF) Restrictions Weight Bearing Restrictions: Yes LLE Weight Bearing: Touchdown weight bearing Other Position/Activity Restrictions: currently using platform walker on L UE   Prior Activity Level Community (5-7x/wk): pt was very independent prior to admit. 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  Home Assistive Devices / Equipment Home Assistive Devices/Equipment: None   Prior Functional Level Current Functional Level  Bed Mobility  Independent  Mod assist   Transfers  Independent  Max assist (using platform walker)   Mobility -  Walk/Wheelchair  Independent  Mod assist (ambulating short distances: 3' and 6' intervals with platfor)   Upper Body Dressing  Independent  Other (not assessed, anticipate ADL needs)   Lower Body Dressing  Independent  Other (not assessed, anticipate ADL needs)   Grooming  Independent  Other (not assessed, anticipate ADL needs)   Eating/Drinking  Independent  Other (currently using R hand with set up assist)   Toilet Transfer  Independent  Mod assist (to [facility])   Bladder Continence   WFL  using BSC or  bedpan   Bowel Management  WFL  no reported BM    Stair Climbing   Independent  Other (not assessed at this time)   Communication  Independent  [facility]   Memory  [facility]  [facility]   Cooking/Meal Prep  Independent      Housework  Independent    Money Management  Independent    Driving   Independent     Special needs/care consideration BiPAP/CPAP no  CPM no  Continuous Drip IV no  Dialysis no         Life Vest no  Oxygen - was on 3 L O2 by nasal cannula Special Bed no  Trach Size no  Wound Vac (area) no       Skin - current L Hip incision and L forearm casted/splinted. Pt with bruise along L chin and bruise on L great toe                               Bowel mgmt: none documented per RN Bladder mgmt: currently using BSC or bedpan Diabetic mgmt no  Previous Home Environment Living Arrangements: Alone  Lives With: Alone (son and d-i-l live next door) Available Help at Discharge: Family;Friend(s) Type of Home: House Home Layout: One level Home Access: Stairs to enter [employer] of Steps: 2 steps at front door, 7 steps at back deck entrance  Discharge Living Setting Plans for Discharge Living Setting: Patient's home Type of Home at Discharge: House Discharge Home Layout: One level Discharge Home Access: Stairs to enter Entrance Stairs-Number of Steps: 2 steps at front door, 7 steps at back deck entrance Does the patient have any problems obtaining your medications?: No  Social/Family/Support Systems Patient Roles: Other (Comment) (enjoys her craft work every Saturday at [employer] shows, gardens) Sport and exercise psychologist Information: Daughter-in-Law [name] works at [employer] Anticipated Caregiver: friends and son/dtr-in-law Anticipated Ambulance person Information: see above Ability/Limitations of Caregiver: son/d-i-l do work but d-i-l (works at [employer]) will be able to stay with pt for 2 days/week. Pt's 3 craft friends can stay with pt during the day while son/d-i-l can be with pt in  evening. Son/D-i-l live next door to pt. Caregiver Availability: 24/7 (24-7 with combo of friends/family in evenings) Discharge Plan Discussed with Primary Caregiver: Yes (discussed with Sandy on 8-11, 8-13 and 8-14) Is Caregiver In Agreement with Plan?: Yes Does Caregiver/Family have Issues with Lodging/Transportation while Pt is in Rehab?: No  Goals/Additional Needs Patient/Family Goal for Rehab: Minimal assistance and supervision with PT/OT, NA for SLP Expected length of stay: 11-13 days Cultural Considerations: none Dietary Needs: regular diet Equipment Needs: to be determined Pt/Family Agrees to Admission and willing to participate: Yes Program Orientation Provided & Reviewed with Pt/Caregiver Including Roles  & Responsibilities: Yes  Patient Condition: I reviewed pt's case with pt and her daughter-in-law from 10-14-13 daily through 10-17-13 and explained the purpose and possibility of inpatient rehabilitation.  This 80 year old patient was previously very active at baseline, living alone and enjoying her craft shows every weekend prior to the fall that resulted in femoral/wrist fractures. Pt is currently requiring moderate to maximal assistance with bed mobility and transfers and ambulating limited distances using a platform walker. Her needs with self care skills and further upper extremity assessment have not yet been determined. However, based on pt's current limitations, we anticipate that pt will have a definite need for the skilled service of occupational therapy to address self care skills and further upper extremity strength/range of motion issues. She will greatly benefit from the multi-disciplinary team of skilled PT, OT and rehab nursing to maximize her functional return following this fall. PT, OT and rehab nursing will focus on increasing pt's independence in transfers, bed mobility, gait and self-care skills. Rehab nursing will address further issues with bowel/bladder needs and  medication education in preparation for home.  In addition, pt will benefit from rehab physician intervention to monitor her hypokalemia and hyponatremia. Discussed case with Dr. Letta Pate and rehab PA who stated that pt is a great rehab candidate. Pt and her daughter-in-law are motivated to come to inpatient rehab and will benefit from the intensive services of skilled therapy under rehab physician guidance. Pt will be admitted today from [facility] on 10[facility]17-13.   Preadmission Screen Completed By:  Nanetta Batty, PT 10/17/2013 10:37 AM ______________________________________________________________________   Discussed status with Dr. Letta Pate on 10-17-13 at 1136 and received telephone approval for admission today.  Admission Coordinator:  Anne Hahn, time 1137/Date 10-17-13   Assessment/Plan: Diagnosis: 1. Does the need for close, 24 hr/day  Medical supervision in concert with the patient's rehab needs make it unreasonable for this patient to be served in a less intensive setting? Yes 2. Co-Morbidities requiring supervision/potential complications: Hypertension, RA, macular degeneration 3. Due to bladder management, bowel management, safety, skin/wound care, disease management, medication administration, pain management and patient education, does the patient require 24 hr/day rehab nursing? Yes 4. Does the patient require coordinated care of a physician, rehab nurse, PT (1-2 hrs/day, 5 days/week) and OT (1-2 hrs/day, 5 days/week) to address physical and functional deficits in the context of the above medical diagnosis(es)? Yes Addressing deficits in the following areas: balance, endurance, locomotion, strength, transferring, bathing, dressing, feeding, grooming and toileting 5. Can the patient actively participate in an intensive therapy program of at least 3 hrs of therapy 5 days a week? Yes 6. The potential for patient to make measurable gains while on inpatient rehab is  good 7. Anticipated functional outcomes upon discharge from inpatients are: modified independent and supervision PT, modified independent and supervision OT, n/a SLP 8. Estimated rehab length of stay to reach the above functional goals is: 7-10 days 9. Does the patient have adequate social supports to accommodate these discharge functional goals? Yes 10. Anticipated D/C setting: Home 11. Anticipated post D/C treatments: [place] therapy 12. Overall Rehab/Functional Prognosis: good    RECOMMENDATIONS: This patient's condition is appropriate for continued rehabilitative care in the following setting: CIR Patient has agreed to participate in recommended program. Yes Note that insurance prior authorization may be required for reimbursement for recommended care.  Comment:  Nanetta Batty, PT 10/17/2013

## 2013-10-17 NOTE — Progress Notes (Signed)
ANTICOAGULATION CONSULT NOTE - Initial Consult  Pharmacy Consult for Apixaban Indication: VTE prophylaxis  Allergies  Allergen Reactions  . Codeine Itching  . Morphine And Related Itching    Patient Measurements: Height: 5\' 3"  (160 cm) Weight: 140 lb 14 oz (63.9 kg) IBW/kg (Calculated) : 52.4 Heparin Dosing Weight:   Vital Signs:    Labs: No results found for this basename: HGB, HCT, PLT, APTT, LABPROT, INR, HEPARINUNFRC, CREATININE, CKTOTAL, CKMB, TROPONINI,  in the last 72 hours  CrCl is unknown because no creatinine reading has been taken.   Medical History: Past Medical History  Diagnosis Date  . Rectal incontinence   . Hypertension   . Arthritis   . Hypothyroidism   . Anemia   . Fibromyalgia   . Glaucoma   . Macular degeneration   . HOH (hard of hearing)     Medications:  Scheduled:  . [START ON 10/18/2013] amLODipine  5 mg Oral Daily  . apixaban  2.5 mg Oral BID  . [START ON 10/18/2013] benazepril  10 mg Oral Daily  . calcium-vitamin D  1 tablet Oral BID  . [START ON 10/18/2013] feeding supplement (ENSURE)  1 Container Oral BID BM  . gabapentin  100 mg Oral BID  . iron polysaccharides  150 mg Oral BID AC  . latanoprost  1 drop Both Eyes QHS  . [START ON 10/18/2013] levothyroxine  88 mcg Oral QAC breakfast  . [START ON 10/18/2013] polyethylene glycol  17 g Oral Daily  . senna-docusate  1 tablet Oral QHS    Assessment: 80yo female s/p ORIF L-hip and ORIF L-wrist at [facility] on 8/11 and 8/12.  Per d/w P.Love PA, pt has only been receiving ASA 325mg  daily.  (+)ABLA, monitoring.  Dopplers to be checked.  Goal of Therapy:  prevention of VTE Monitor platelets by anticoagulation protocol: Yes   Plan:  1-  Apixaban 2.5mg  po BID 2-  F/U dopplers, adjust dosing if necessary. 3-  Watch for s/s of bleeding  Gracy Bruins, PharmD [facility]

## 2013-10-17 NOTE — H&P (Signed)
Physical Medicine and Rehabilitation Admission H&P  CC: Displaced left femoral neck fracture, left distal radius-ulna fracture, left femoral shaft fracture.  HPI: Ms. Jenna Ramos is an 80 year old female with history of HTN, RA, fibromyalgia, macular degeneration who caught her toe on the edge of a stair, tripped and fell on her steps on 10/12/13 with onset of severe left hip and left wrist pain and inability to ambulate. She was taken to [facility] ED and workup revealed displaced left femoral neck fracture and distal left radius and ulna fracture. X ray showed question of lytic lesion and NM body scan negative for metastasis. She underwent left hip bipolar hemi and ORIF left wrist by Dr. Derald Macleod on 10/14/13. Post op xrays done revealing small fracture distal to tip of porthesis on posterior aspect of femur and she was taken to OR for ORIF left periprosthetic fracture on 10/15/13. Post op posterior hip precautions X 2 months and is TTWB LLE X 6 weeks. She has had hypoxia requiring 3L oxygen per  as well as hypokalemia and hyponatremia treated with IVF. ABLA is being monitored for now. BP medication being adjusted due to hypotension. Therapy ongoing and CIR recommended by Rehab team. Notes reviewed and patient felt to be a good candidate and admitted today for follow up therapy.  ROS :  L wrist and Left Hip pain, Constipation- no BM x 6 d Past Medical History   Diagnosis  Date   .  Rectal incontinence    .  Hypertension    .  Arthritis    .  Hypothyroidism    .  Chronic rheumatic arthritis    .  Anemia    .  Fibromyalgia    .  Glaucoma     Past Surgical History   Procedure  Laterality  Date   .  Tubal ligation      Family History   Problem  Relation  Age of Onset   .  Prostate cancer  Father    .  Lung disease  Sister     Social History: has no tobacco, alcohol, and drug history on file.  Allergies   Allergen  Reactions   .  Codeine  Itching   .  Morphine And  Related  Itching    Home Medications:  ASA 81 mg daily  Fish oil 1200 mg tid  Levoxyl 88 mcg daily  Lotrel 10/20 daily  Lumigan daily  MVI  Calcium with Vit D daily  Home:  One level home with 4 STE/2 hand rails  Functional History:  Independent PTA.  Functional Status:  Mobility:  Max assist for transfers  Mod assist for 3-6' with rest breaks  ADL:  Mod assist to [facility]  Cognition:    Physical Exam:   General: No acute distress Mood and affect are appropriate Heart: Regular rate and rhythm no rubs murmurs or extra sounds Lungs: Clear to auscultation, breathing unlabored, no rales or wheezes Abdomen: Positive bowel sounds, soft nontender to palpation, nondistended Extremities: No clubbing, cyanosis, or edema Skin: Left lateral thigh surgical incision with clips, no evidence of erythema or drainage, small amount of fresh red blood Neurologic:, motor strength is 5/5 in right deltoid, bicep, tricep, grip, hip flexor, knee extensors, ankle dorsiflexor and plantar flexor 5/5 left deltoid bicep triceps 4 minus/5 left grip, trace left hip flexor knee extensor 2 minus left ankle dorsiflexor plantar flexor inhibited by pain Sensory exam normal sensation to light touch and proprioception in bilateral upper  and lower extremities  Musculoskeletal: Left wrist splint, decreased range of motion actively in the left hip and left knee inhibited by pain passive range also inhibited by pain Medical Problem List and Plan:  1. Functional deficits secondary to left femoral neck fracture, left femoral shaft fracture, distal left radius and ulna fracture.  2. DVT Prophylaxis/Anticoagulation: Pharmaceutical: Other (comment)--Eliquis. Check dopplers.  3. Pain Management: prn medications effective. History of fibromyalgia she thinks her doctors have tried different fibromyalgia medicines but is not sure which ones, consider gabapentin 4. Mood: LCSW to follow for evaluation and support.  5. Neuropsych:  This patient is capable of making decisions on her own behalf.  6. Skin/Wound Care: Routine pressure relief measures. Maintain adequate nutritional status. Will add nutritional supplement.  7. HTN: Will continue to hold Norvasc. Check orthostatic BP. Monitor BP on tid basis and further adjust medications as indicated. Continue benazepril daily.  8. ABLA: Will recheck labs on Monday. Continue iron supplement.  9. Hyponatremia/Hypokalemia: Resolving. Check follow up labs in am.  10. Hypoxia: Continue 2-3 liters Oxygen to keep saturation > 90%. Wean oxygen as tolerated. Will check CXR to rule out acute process.  Post Admission Physician Evaluation:  1. Functional deficits secondary to  left femoral neck fracture, left distal radius-ulna fracture, left femoral shaft fracture. 2. Patient is admitted to receive collaborative, interdisciplinary care between the physiatrist, rehab nursing staff, and therapy team. 3. Patient's level of medical complexity and substantial therapy needs in context of that medical necessity cannot be provided at a lesser intensity of care such as a SNF. 4. Patient has experienced substantial functional loss from his/her baseline which was documented above under the "Functional History" and "Functional Status" headings. Judging by the patient's diagnosis, physical exam, and functional history, the patient has potential for functional progress which will result in measurable gains while on inpatient rehab. These gains will be of substantial and practical use upon discharge in facilitating mobility and self-care at the household level. 5. Physiatrist will provide 24 hour management of medical needs as well as oversight of the therapy plan/treatment and provide guidance as appropriate regarding the interaction of the two. 6. 24 hour rehab nursing will assist with bladder management, bowel management, safety, skin/wound care, disease management, medication administration, pain management  and patient education and help integrate therapy concepts, techniques,education, etc. 7. PT will assess and treat for/with: pre gait, gait training, endurance , safety, equipment, neuromuscular re education. Goals are: Sup. 8. OT will assess and treat for/with: ADLs, Cognitive perceptual skills, Neuromuscular re education, safety, endurance, equipment,balance. Goals are: Sup. 9. SLP will assess and treat for/with: NA. Goals are: NA. 10. Case Management and Social Worker will assess and treat for psychological issues and discharge planning. 11. Team conference will be held weekly to assess progress toward goals and to determine barriers to discharge. 12. Patient will receive at least 3 hours of therapy per day at least 5 days per week. 13. ELOS: 10-12d  14. Prognosis: good  Charlett Blake M.D. [facility] FAAPM&R (Sports Med, Neuromuscular Med) Diplomate [place] Board of Electrodiagnostic Med

## 2013-10-17 NOTE — Progress Notes (Signed)
Patient arrived via EMS from [facility] with belongings. Patient oriented to room, rehab process, fall prevention plan, rehab safety plan, and call light system. No c/o pain at this time. Will continue to monitor.

## 2013-10-18 ENCOUNTER — Inpatient Hospital Stay (HOSPITAL_COMMUNITY): Payer: Federal, State, Local not specified - PPO | Admitting: *Deleted

## 2013-10-18 ENCOUNTER — Inpatient Hospital Stay (HOSPITAL_COMMUNITY): Payer: Medicare Other | Admitting: Physical Therapy

## 2013-10-18 DIAGNOSIS — R609 Edema, unspecified: Secondary | ICD-10-CM

## 2013-10-18 DIAGNOSIS — S72009A Fracture of unspecified part of neck of unspecified femur, initial encounter for closed fracture: Secondary | ICD-10-CM

## 2013-10-18 LAB — CBC WITH DIFFERENTIAL/PLATELET
BASOS ABS: 0.1 10*3/uL (ref 0.0–0.1)
BASOS PCT: 1 % (ref 0–1)
EOS PCT: 4 % (ref 0–5)
Eosinophils Absolute: 0.5 10*3/uL (ref 0.0–0.7)
HCT: 28.1 % — ABNORMAL LOW (ref 36.0–46.0)
Hemoglobin: 9.2 g/dL — ABNORMAL LOW (ref 12.0–15.0)
Lymphocytes Relative: 19 % (ref 12–46)
Lymphs Abs: 2 10*3/uL (ref 0.7–4.0)
MCH: 27.1 pg (ref 26.0–34.0)
MCHC: 32.7 g/dL (ref 30.0–36.0)
MCV: 82.9 fL (ref 78.0–100.0)
MONO ABS: 1.2 10*3/uL — AB (ref 0.1–1.0)
Monocytes Relative: 11 % (ref 3–12)
Neutro Abs: 7.2 10*3/uL (ref 1.7–7.7)
Neutrophils Relative %: 65 % (ref 43–77)
Platelets: 292 10*3/uL (ref 150–400)
RBC: 3.39 MIL/uL — ABNORMAL LOW (ref 3.87–5.11)
RDW: 14.6 % (ref 11.5–15.5)
WBC: 10.9 10*3/uL — ABNORMAL HIGH (ref 4.0–10.5)

## 2013-10-18 LAB — COMPREHENSIVE METABOLIC PANEL
ALBUMIN: 2.2 g/dL — AB (ref 3.5–5.2)
ALT: 22 U/L (ref 0–35)
AST: 30 U/L (ref 0–37)
Alkaline Phosphatase: 115 U/L (ref 39–117)
Anion gap: 11 (ref 5–15)
BUN: 15 mg/dL (ref 6–23)
CALCIUM: 9.1 mg/dL (ref 8.4–10.5)
CO2: 27 meq/L (ref 19–32)
CREATININE: 0.63 mg/dL (ref 0.50–1.10)
Chloride: 101 mEq/L (ref 96–112)
GFR calc Af Amer: >90 mL/min (ref >90)
GFR calc non Af Amer: 81 mL/min — ABNORMAL LOW (ref >90)
Glucose, Bld: 140 mg/dL — ABNORMAL HIGH (ref 70–99)
Potassium: 3.9 mEq/L (ref 3.7–5.3)
SODIUM: 139 meq/L (ref 137–147)
TOTAL PROTEIN: 5.8 g/dL — AB (ref 6.0–8.3)
Total Bilirubin: 0.6 mg/dL (ref 0.3–1.2)

## 2013-10-18 MED ORDER — BENAZEPRIL HCL 5 MG PO TABS
5.0000 mg | ORAL_TABLET | Freq: Every day | ORAL | Status: DC
  Administered 2013-10-19: 5 mg via ORAL
  Filled 2013-10-18 (×3): qty 1

## 2013-10-18 MED ORDER — SORBITOL 70 % SOLN
30.0000 mL | Freq: Two times a day (BID) | Status: DC | PRN
Start: 2013-10-18 — End: 2013-10-30
  Administered 2013-10-18 – 2013-10-23 (×2): 30 mL via ORAL
  Filled 2013-10-18 (×2): qty 30

## 2013-10-18 MED ORDER — LEVOTHYROXINE SODIUM 88 MCG PO TABS
44.0000 ug | ORAL_TABLET | ORAL | Status: DC
  Administered 2013-10-19 – 2013-10-26 (×3): 44 ug via ORAL
  Filled 2013-10-18 (×3): qty 0.5

## 2013-10-18 MED ORDER — LEVOTHYROXINE SODIUM 88 MCG PO TABS
88.0000 ug | ORAL_TABLET | ORAL | Status: DC
  Administered 2013-10-20 – 2013-10-30 (×9): 88 ug via ORAL
  Filled 2013-10-18 (×10): qty 1

## 2013-10-18 NOTE — Evaluation (Signed)
Occupational Therapy Assessment and Plan  Patient Details  Name: Jenna Ramos MRN: [id number] Date of Birth: Nov 27, 1928  OT Diagnosis: muscle weakness (generalized) and pain in joint Rehab Potential: Rehab Potential: Good ELOS: 21 days   Today's Date: 10/18/2013 Time:  -   0830-0930   (60 min)  1st session Pain:  4/10  Left hip    Problem List:  Patient Active Problem List   Diagnosis Date Noted  . Hip fracture requiring operative repair 10/17/2013  . Anemia     Past Medical History:  Past Medical History  Diagnosis Date  . Rectal incontinence   . Hypertension   . Arthritis   . Hypothyroidism   . Anemia   . Fibromyalgia   . Glaucoma   . Macular degeneration   . HOH (hard of hearing)    Past Surgical History:  Past Surgical History  Procedure Laterality Date  . Tubal ligation      Assessment & Plan Clinical Impression: Clinical Impression: Ms. Jenna Ramos is an 81 year old female with history of HTN, RA, fibromyalgia, macular degeneration who caught her toe on the edge of a stair, tripped and fell on her steps on 10/12/13 with onset of severe left hip and left wrist pain and inability to ambulate. She was taken to [facility] ED and workup revealed displaced left femoral neck fracture and distal left radius and ulna fracture. X ray showed question of lytic lesion and NM body scan negative for metastasis. She underwent left hip bipolar hemi and ORIF left wrist by Dr. Derald Macleod on 10/14/13. Post op xrays done revealing small fracture distal to tip of porthesis on posterior aspect of femur and she was taken to OR for ORIF left periprosthetic fracture on 10/15/13. Post op posterior hip precautions X 2 months and is TTWB LLE X 6 weeks Patient transferred to CIR on 10/17/2013 .    Patient currently requires mod with basic self-care skills secondary to muscle weakness and muscle joint tightness.  Prior to hospitalization, patient could complete BADL and IADL  with independent .  Patient will benefit from skilled intervention to increase independence with basic self-care skills and increase level of independence with iADL prior to discharge home with supervision 24/7 for 2 weeks and then intermittent.  Anticipate patient will require 24 hour supervision and follow up home health.  OT - End of Session Activity Tolerance: Tolerates 10 - 20 min activity with multiple rests Endurance Deficit: Yes Endurance Deficit Description: pain and fatigue limit functional mobility and therapeutic exercise OT Assessment Rehab Potential: Good Barriers to Discharge: Decreased caregiver support OT Patient demonstrates impairments in the following area(s): Balance;Endurance;Motor;Pain OT Basic ADL's Functional Problem(s): Grooming;Bathing;Dressing;Toileting OT Advanced ADL's Functional Problem(s): Simple Meal Preparation;Laundry OT Transfers Functional Problem(s): Toilet;Tub/Shower OT Additional Impairment(s): None OT Plan OT Intensity: Minimum of 1-2 x/day, 45 to 90 minutes OT Frequency: 5 out of 7 days OT Duration/Estimated Length of Stay: 21 days OT Treatment/Interventions: Medical illustrator training;Community reintegration;Discharge planning;DME/adaptive equipment instruction;Functional mobility training;Pain management;Patient/family education;Self Care/advanced ADL retraining;Therapeutic Activities;Therapeutic Exercise;UE/LE Strength taining/ROM;UE/LE Coordination activities;Wheelchair propulsion/positioning OT Self Feeding Anticipated Outcome(s): independent OT Basic Self-Care Anticipated Outcome(s): supervision/set up OT Toileting Anticipated Outcome(s): supervision OT Bathroom Transfers Anticipated Outcome(s): mod I to toilet transfer; min a to tub bench OT Recommendation Patient destination: Home Follow Up Recommendations: Home health OT Equipment Recommended: Tub/shower bench;3 in 1 bedside comode Equipment Details: 3n1 over toilet   Skilled  Therapeutic Intervention Time:  3810-1751   (60 min)2nd session Pain:4/10 with  intermittent sharp pains with movements Individual session Addressed transfers to wc, bed, tub bench and toilet.  Pt. Was min to mod assist with transfers except max assist with tub.  Was unable to transfer to tub bench with LLE and increased pain with lateral movements.  Adjusted 3n1 over toilet.  Pt utilized toilet with mod assist with transfers and cues for technique.     OT Evaluation Precautions/Restrictions  Precautions Precautions: Posterior Hip;Fall;Other (comment) Precaution Comments: L forearm casted - NWB Restrictions Weight Bearing Restrictions: Yes LUE Weight Bearing: Weight bear through elbow only LLE Weight Bearing: Touchdown weight bearing Other Position/Activity Restrictions: currently using platform walker on L UE     Pain Pain Assessment Pain Assessment: No/denies pain Pain Score: 4 Pain Type: Surgical pain Pain Location: Hip Pain Orientation: Left Pain Descriptors / Indicators: Aching Pain Frequency: Intermittent Pain Onset: Gradual Pain Intervention(s): Medication (See eMAR) Home Living/Prior Functioning Home Living Available Help at Discharge: Family;Friend(s) Type of Home: House Home Access: Stairs to enter [employer] of Steps: 4 low steps at the front with B rails (cannot reach both), 7 stairs at back entrance Entrance Stairs-Rails: Right;Left Home Layout: One level Additional Comments: master bathroom is accessible by w/c  Lives With: Alone IADL History Homemaking Responsibilities: Yes Meal Prep Responsibility: Primary Laundry Responsibility: Primary Cleaning Responsibility: Secondary Bill Paying/Finance Responsibility: Primary Shopping Responsibility: Primary Child Care Responsibility: No Current License: Yes Mode of Transportation: Car Occupation: Retired Prior Function Level of Independence: Independent with basic ADLs;Independent with homemaking  with ambulation;Independent with gait;Independent with transfers  Able to Take Stairs?: Yes Driving: Yes Vocation: Retired Leisure: Hobbies-yes (Comment) Comments: gardening ADL   Vision/Perception  Vision- Assessment Eye Alignment: Within Functional Limits Perception Perception: Within Functional Limits Praxis Praxis: Intact  Cognition Overall Cognitive Status: Within Functional Limits for tasks assessed Arousal/Alertness: Awake/alert Orientation Level: Oriented X4 Attention: Focused;Sustained Focused Attention: Appears intact Sustained Attention: Appears intact Memory: Appears intact Awareness: Appears intact Problem Solving: Impaired Problem Solving Impairment: Functional complex Executive Function: Reasoning;Initiating;Organizing;Sequencing Reasoning: Appears intact Sequencing: Appears intact Organizing: Appears intact Initiating: Appears intact Safety/Judgment: Appears intact Sensation Sensation Light Touch: Appears Intact Stereognosis: Not tested Hot/Cold: Not tested Proprioception: Not tested Coordination Gross Motor Movements are Fluid and Coordinated: No (RUE= wfl;  LUE =  casted) Fine Motor Movements are Fluid and Coordinated: Yes Coordination and Movement Description: due to posterior hip precautions, TTWB on L LE, and NWB L UE status Motor  Motor Motor: Other (comment) (difficulty gong backwards with walker) Motor - Skilled Clinical Observations: difficulty with stepping back with RW Mobility  Bed Mobility Bed Mobility: Supine to Sit;Sitting - Scoot to Edge of Bed Supine to Sit: 3: Mod assist Supine to Sit Details: Manual facilitation for placement;Verbal cues for technique;Verbal cues for sequencing Sitting - Scoot to Edge of Bed: 4: Min guard Transfers Sit to Stand: 4: Min assist Sit to Stand Details: Verbal cues for gait pattern;Manual facilitation for placement Sit to Stand Details (indicate cue type and reason): left platform walker Stand to  Sit: 4: Min assist Stand to Sit Details (indicate cue type and reason): Manual facilitation for placement  Trunk/Postural Assessment  Cervical Assessment Cervical Assessment: Within Functional Limits Thoracic Assessment Thoracic Assessment: Within Functional Limits Lumbar Assessment Lumbar Assessment: Within Functional Limits Postural Control Postural Control: Deficits on evaluation Protective Responses: slow to respond Postural Limitations: decreased postural control due to WBing limitations on LLE/LUE  Balance Balance Balance Assessed: Yes Static Sitting Balance Static Sitting - Balance Support: Bilateral upper extremity supported;Feet  supported Static Sitting - Level of Assistance: 5: Stand by assistance Dynamic Sitting Balance Dynamic Sitting - Balance Support: Bilateral upper extremity supported;Feet supported;During functional activity Dynamic Sitting - Level of Assistance: 4: Min assist (Can do lateral reaching but not down to floor) Dynamic Sitting Balance - Compensations: limited due to HIp precautions Dynamic Sitting - Balance Activities: Reaching for objects Static Standing Balance Static Standing - Balance Support: Bilateral upper extremity supported;During functional activity Static Standing - Level of Assistance: 4: Min assist Dynamic Standing Balance Dynamic Standing - Balance Support: Bilateral upper extremity supported;During functional activity Dynamic Standing - Level of Assistance: 3: Mod assist Dynamic Standing - Balance Activities: Forward lean/weight shifting;Lateral lean/weight shifting Extremity/Trunk Assessment RUE Assessment RUE Assessment: Within Functional Limits LUE Assessment LUE Assessment: Exceptions to WFL LUE AROM (degrees) Overall AROM Left Upper Extremity: Deficits;Due to precautions;Due to pain LUE Overall AROM Comments: wrist casted; elbow and shoulder wfl LUE Strength LUE Overall Strength: Deficits;Due to precautions LUE Overall  Strength Comments: wrist and elbow not tested; shoulder flexion 4/5  FIM:  FIM - Eating Eating Activity: 7: Complete independence:no helper FIM - Toileting Toileting steps completed by patient: Performs perineal hygiene Toileting Assistive Devices: Grab bar or rail for support Toileting: 2: Max-Patient completed 1 of 3 steps FIM - Radio producer Devices: Bedside commode;Grab bars Toilet Transfers: 4-To toilet/BSC: Mod A (steadying Pt. >50%);4-From toilet/BSC: Min A (steadying Pt. > 75%)  SEE FIM for other ADL levels.   Refer to Care Plan for Long Term Goals  Recommendations for other services: None  Discharge Criteria: Patient will be discharged from OT if patient refuses treatment 3 consecutive times without medical reason, if treatment goals not met, if there is a change in medical status, if patient makes no progress towards goals or if patient is discharged from hospital.  The above assessment, treatment plan, treatment alternatives and goals were discussed and mutually agreed upon: by patient  Lisa Roca 10/18/2013, 7:04 PM

## 2013-10-18 NOTE — Progress Notes (Signed)
*  PRELIMINARY RESULTS* Vascular Ultrasound Lower extremity venous duplex has been completed.  Preliminary findings: no evidence of DVT  Landry Mellow, RDMS, RVT  10/18/2013, 12:55 PM

## 2013-10-18 NOTE — Evaluation (Signed)
Physical Therapy Assessment and Plan  Patient Details  Name: MATAYA KILDUFF MRN: [id number] Date of Birth: 07-18-1928  PT Diagnosis: Abnormal posture, Coordination disorder, Difficulty walking, Edema, Muscle weakness and Pain in joint Rehab Potential: Good ELOS: 21 days   Today's Date: 10/18/2013 Time: 1105- 1215  Time Duration: 70 minutes  Problem List:  Patient Active Problem List   Diagnosis Date Noted  . Hip fracture requiring operative repair 10/17/2013  . Anemia     Past Medical History:  Past Medical History  Diagnosis Date  . Rectal incontinence   . Hypertension   . Arthritis   . Hypothyroidism   . Anemia   . Fibromyalgia   . Glaucoma   . Macular degeneration   . HOH (hard of hearing)    Past Surgical History:  Past Surgical History  Procedure Laterality Date  . Tubal ligation      Assessment & Plan Clinical Impression:  Ms. Melvine Julin is an 80 year old female with history of HTN, RA, fibromyalgia, macular degeneration who caught her toe on the edge of a stair, tripped and fell on her steps on 10/12/13 with onset of severe left hip and left wrist pain and inability to ambulate. She was taken to [facility] ED and workup revealed displaced left femoral neck fracture and distal left radius and ulna fracture. X ray showed question of lytic lesion and NM body scan negative for metastasis. She underwent left hip bipolar hemi and ORIF left wrist by Dr. Derald Macleod on 10/14/13. Post op xrays done revealing small fracture distal to tip of porthesis on posterior aspect of femur and she was taken to OR for ORIF left periprosthetic fracture on 10/15/13. Post op posterior hip precautions X 2 months and is TTWB LLE X 6 weeks.  Patient transferred to CIR on 10/17/2013 .   Patient currently requires mod with mobility secondary to muscle weakness, decreased cardiorespiratoy endurance, decreased coordination and decreased sitting balance, decreased standing  balance, decreased postural control and difficulty maintaining precautions.  Prior to hospitalization, patient was independent  with mobility and lived with Alone in a House home.  Home access is 4 low steps at the front with B rails (cannot reach both), 7 stairs at back entranceStairs to enter.  Patient will benefit from skilled PT intervention to maximize safe functional mobility, minimize fall risk and decrease caregiver burden for planned discharge home alone.  Anticipate patient will benefit from follow up Eva at discharge.     Skilled Therapeutic Intervention PT Evaluation: PT completes manual muscle testing, sensation assessment, balance testing, and functional mobility assessment.   Therapeutic Activity: PT instructs pt in bed mobility, specifically moving while following posterior hip precautions and NWB L wrist precautions: mod A supine to sit via modified long sit, CGA scoot to the edge of bed. PT instructs pt in sit to stand with L platform RW req min A and verbal cues for sequencing, min A stand-pivot transfer bed to w/c. PT instructs pt in stand-pivot transfer w/c to bed without AD req mod A. Pt left sitting edge of bed with lunch tray in front of her with family friend present who agrees to make sure pt is assisted back to lying down in bed after eating by CNA.   Gait Training: PT instructs pt in ambulation with L platform RW req min A - PT places her foot under pt's L foot to ensure she does not exceed TTWB status, while also assisting L LE in progression and  min A at ribcabe for balance assist. Pt is able to progress x 8' with w/c follow by family friend for safety.   Therapeutic Exercise: PT instructs pt in L LE A/AROM exercises: heel slides, supine hip abduciton/adduction, hip ER, LAQ x 10 reps each.   Pt presents with weak L LE and casted L UE. Pt is motivated to work hard, but will need work on bed mobility, transfers, moving while following all precautions, increasing activity  tolerance. Progressive gait, and stair training as is safe.    PT Evaluation Precautions/Restrictions Precautions Precautions: Posterior Hip;Fall;Other (comment) Precaution Comments: L forearm casted - NWB Restrictions Weight Bearing Restrictions: Yes LUE Weight Bearing: Non weight bearing LLE Weight Bearing: Touchdown weight bearing Other Position/Activity Restrictions: currently using platform walker on L UE General Chart Reviewed: Yes Family/Caregiver Present: No Vital Signs  Pain Pain Assessment Pain Assessment: 0-10 Pain Score: 1  Pain Type: Surgical pain Pain Location: Hip Pain Orientation: Left Pain Descriptors / Indicators: Aching Pain Frequency: Intermittent Pain Onset: On-going Pain Intervention(s): Repositioned Multiple Pain Sites: No Home Living/Prior Functioning Home Living Available Help at Discharge: Family;Friend(s) Type of Home: House Home Access: Stairs to enter [employer] of Steps: 4 low steps at the front with B rails (cannot reach both), 7 stairs at back entrance Entrance Stairs-Rails: Right;Left Home Layout: One level Additional Comments: master bathroom is accessible by w/c  Lives With: Alone Prior Function Level of Independence: Independent with basic ADLs;Independent with homemaking with ambulation;Independent with gait;Independent with transfers  Able to Take Stairs?: Yes Driving: Yes Vocation: Retired Leisure: Hobbies-yes (Comment) Comments: gardening Vision/Perception  Perception Comments: wfl  Cognition Arousal/Alertness: Awake/alert Orientation Level: Oriented X4 Attention: Focused;Sustained Focused Attention: Appears intact Sustained Attention: Appears intact Awareness: Appears intact Sensation Sensation Light Touch: Appears Intact Stereognosis: Not tested Hot/Cold: Not tested Proprioception: Not tested Coordination Gross Motor Movements are Fluid and Coordinated: No Fine Motor Movements are Fluid and  Coordinated: Not tested Coordination and Movement Description: due to posterior hip precautions, TTWB on L LE, and NWB L UE status Motor  Motor Motor: Within Functional Limits  Mobility Bed Mobility Bed Mobility: Supine to Sit;Sitting - Scoot to Edge of Bed Supine to Sit: 3: Mod assist Supine to Sit Details: Manual facilitation for placement;Verbal cues for technique;Verbal cues for sequencing Sitting - Scoot to Edge of Bed: 4: Min guard Transfers Transfers: Yes Sit to Stand: 4: Min assist Sit to Stand Details: Manual facilitation for placement Sit to Stand Details (indicate cue type and reason): with L platform RW Stand to Sit: 4: Min assist Stand to Sit Details (indicate cue type and reason): Manual facilitation for placement Stand Pivot Transfers: 3: Mod assist Stand Pivot Transfer Details: Manual facilitation for placement;Verbal cues for precautions/safety Stand Pivot Transfer Details (indicate cue type and reason): min A with L platform RW; mod A as stand-pivot without AD Locomotion  Ambulation Ambulation/Gait Assistance: 4: Min assist Gait Gait: Yes Gait Pattern: Impaired Gait Pattern: Step-to pattern;Decreased step length - left;Decreased step length - right;Decreased stance time - left;Decreased hip/knee flexion - left;Shuffle;Antalgic Gait velocity: slowed Architect: Yes Wheelchair Assistance: 1: +1 Total assist Wheelchair Assistance Details: Manual facilitation for placement Wheelchair Parts Management: Needs assistance (due to precautions) Distance: 10  Trunk/Postural Assessment     Balance Balance Balance Assessed: Yes Extremity Assessment  RUE Assessment RUE Assessment: Within Functional Limits LUE Assessment LUE Assessment: Exceptions to WFL LUE AROM (degrees) Overall AROM Left Upper Extremity: Deficits;Due to precautions;Due to pain LUE Overall AROM  Comments: wrist casted; elbow and shoulder wfl LUE Strength LUE Overall  Strength: Deficits;Due to precautions LUE Overall Strength Comments: wrist and elbow not tested; shoulder flexion 4/5 RLE Assessment RLE Assessment: Within Functional Limits LLE Assessment LLE Assessment: Exceptions to WFL LLE AROM (degrees) Overall AROM Left Lower Extremity: Deficits;Due to decreased strength;Due to precautions;Due to pain LLE Overall AROM Comments: L hip flexion 45 degrees LLE Strength LLE Overall Strength: Deficits;Due to precautions;Due to pain LLE Overall Strength Comments: L hip flexion 2+/5, L hip abduction 2-/5, L hip adduction 2/5, knee extension 2+/5, knee flexion 2+/5, ankle DF >= 3/5  FIM:  FIM - Control and instrumentation engineer Devices: Walker;Arm rests (with L platform) Bed/Chair Transfer: 3: Supine > Sit: Mod A (lifting assist/Pt. 50-74%/lift 2 legs;4: Bed > Chair or W/C: Min A (steadying Pt. > 75%);3: Chair or W/C > Bed: Mod A (lift or lower assist) FIM - Locomotion: Wheelchair Distance: 10 Locomotion: Wheelchair: 1: Total Assistance/staff pushes wheelchair (Pt<25%) FIM - Locomotion: Ambulation Locomotion: Ambulation Assistive Devices: Engineer, agricultural (L platform) Ambulation/Gait Assistance: 4: Min assist Locomotion: Ambulation: 1: Travels less than 50 ft with minimal assistance (Pt.>75%) FIM - Locomotion: Stairs Locomotion: Stairs: 0: Activity did not occur   Refer to Care Plan for Long Term Goals  Recommendations for other services: None  Discharge Criteria: Patient will be discharged from PT if patient refuses treatment 3 consecutive times without medical reason, if treatment goals not met, if there is a change in medical status, if patient makes no progress towards goals or if patient is discharged from hospital.  The above assessment, treatment plan, treatment alternatives and goals were discussed and mutually agreed upon: by patient  [facility] M 10/18/2013, 11:58 AM

## 2013-10-18 NOTE — Plan of Care (Signed)
Problem: RH BOWEL ELIMINATION Goal: RH STG MANAGE BOWEL WITH ASSISTANCE STG Manage Bowel with min Assistance.  Outcome: Not Progressing Last BM per report 8/8. Sorbitol and mirilax given this shift

## 2013-10-18 NOTE — Progress Notes (Signed)
Subj: No complaints- feels well Eager to start therapy  Obj: BP 90/47  Pulse 135  Temp(Src) 98.1 F (36.7 C) (Oral)  Resp 17  Ht 5\' 3"  (1.6 m)  Wt 140 lb 14 oz (63.9 kg)  BMI 24.96 kg/m2  SpO2 97%  nad Chest cta cv- reg rate abd- soft extrr- no edema  Medical Problem List and Plan:  1. Functional deficits secondary to left femoral neck fracture, left femoral shaft fracture, distal left radius and ulna fracture.  2. DVT Prophylaxis/Anticoagulation: Pharmaceutical: Other (comment)--Eliquis. Check dopplers.  3. Pain Management: prn medications effective. Pain currently well controlled. 10/18/2013  4. Mood: LCSW to follow for evaluation and support.  5. Neuropsych: This patient is capable of making decisions on her own behalf.  6. Skin/Wound Care: Routine pressure relief measures. Maintain adequate nutritional status. Will add nutritional supplement.  7. HTN: she continues to complain of orthostatic sxs Will stop amlodipine and hold benazepril for sbp <120 8. ABLA: Will recheck labs on Monday. Continue iron supplement.  9. Hyponatremia/Hypokalemia: Resolving. Check follow up labs in am.  10. Hypoxia: Continue 2-3 liters Oxygen to keep saturation > 90%. Wean oxygen as tolerated. Will check CXR to rule out acute process.

## 2013-10-19 ENCOUNTER — Inpatient Hospital Stay (HOSPITAL_COMMUNITY): Payer: Medicare Other | Admitting: *Deleted

## 2013-10-19 MED ORDER — BENAZEPRIL HCL 10 MG PO TABS
10.0000 mg | ORAL_TABLET | Freq: Every day | ORAL | Status: DC
  Administered 2013-10-20 – 2013-10-30 (×11): 10 mg via ORAL
  Filled 2013-10-19 (×12): qty 1

## 2013-10-19 NOTE — Progress Notes (Signed)
Occupational Therapy Session Note  Patient Details  Name: Jenna Ramos MRN: [id number] Date of Birth: [birth year]  Today's Date: 10/19/2013 Time:  - 0900-1010   (70 min)     Short Term Goals: Week 1:  OT Short Term Goal 1 (Week 1): Pt. will utilized AE for LB Bathing and dressing with minimal assist. OT Short Term Goal 2 (Week 1): Pt will bath LB with min assist plus AE. OT Short Term Goal 3 (Week 1): Pt. will be minimal assist with LB dressing OT Short Term Goal 4 (Week 1): Pt. will recall hip precautions and TTWB with one questioning cue OT Short Term Goal 5 (Week 1): Pt.  will adhere to hip precautions when doing bed,to walker to toilet transfers  Skilled Therapeutic Interventions/Progress Updates:    Pt. Lying in bed.  Pt wanted to take sponge bathe.  Went from lying to EOB with min assist to maintain hip precautions.  Transferred to wc>3n1 over toilet with minimal cues for wB ing and hip precautions.  Pt. Transferred back to wc to perform bathing and dressing at sink.  Stood for 3 minutes with min to SBA for balance and using sink as support.  Pt. Only had house dress and brief for dressing.  She stood for another 5 minutes during LB dressing.   Pt transferred back to bed with minimal assist with cues for precautions.  Left pt in bed with call bell,phone within reach.    Therapy Documentation Precautions:  Precautions Precautions: Posterior Hip;Fall;Other (comment) Precaution Comments: L forearm casted - NWB Restrictions Weight Bearing Restrictions: Yes LUE Weight Bearing: Weight bear through elbow only LLE Weight Bearing: Touchdown weight bearing Other Position/Activity Restrictions: currently using platform walker on L UE     Pain: Pain Assessment Pain Assessment: 0-10 Pain Score: 1 at rest Pain Type: Acute pain Pain Location: Hip Pain Orientation: Left Pain Descriptors / Indicators: Aching Pain Onset: With Activity Patients Stated Pain Goal: 2 Pain  Intervention(s): RN made aware;Repositioned     See FIM for current functional status  Therapy/Group: Individual Therapy  Lisa Roca 10/19/2013, 9:15 AM

## 2013-10-19 NOTE — Progress Notes (Signed)
Subj: Feels well Had BM yesterda7- her stomach feels much better today  Obj: BP 170/65  Pulse 68  Temp(Src) 98 F (36.7 C) (Oral)  Resp 18  Ht 5\' 3"  (1.6 m)  Wt 140 lb 14 oz (63.9 kg)  BMI 24.96 kg/m2  SpO2 98%  nad Chest cta cv- reg rate abd- soft, +bs extrr- no edema  Medical Problem List and Plan:  1. Functional deficits secondary to left femoral neck fracture, left femoral shaft fracture, distal left radius and ulna fracture.  2. DVT Prophylaxis/Anticoagulation: Pharmaceutical: Other (comment)--Eliquis. Check dopplers.  3. Pain Management: prn medications effective. Pain currently well controlled 4. Mood: LCSW to follow for evaluation and support.  5. Neuropsych: This patient is capable of making decisions on her own behalf.  6. Skin/Wound Care: Routine pressure relief measures. Maintain adequate nutritional status. Will add nutritional supplement.  7. HTN: orthostatic sxs are improved Will stop amlodipine and hold benazepril for sbp <120 BP seems to be too high now. Will increase benazepril to 10 mg po qd 8. ABLA: Will recheck labs on Monday. Continue iron supplement.  9. Hyponatremia/Hypokalemia: Resolving. Check follow up labs in am.  10. Hypoxia: Continue 2-3 liters Oxygen to keep saturation > 90%. Wean oxygen as tolerated. Will check CXR to rule out acute process.

## 2013-10-20 ENCOUNTER — Encounter (HOSPITAL_COMMUNITY): Payer: Federal, State, Local not specified - PPO | Admitting: Occupational Therapy

## 2013-10-20 ENCOUNTER — Inpatient Hospital Stay (HOSPITAL_COMMUNITY): Payer: Federal, State, Local not specified - PPO

## 2013-10-20 ENCOUNTER — Inpatient Hospital Stay (HOSPITAL_COMMUNITY): Payer: Medicare Other

## 2013-10-20 LAB — URINE CULTURE: Colony Count: 30000

## 2013-10-20 NOTE — Progress Notes (Addendum)
Skin assessment completed with Algis Liming , PA. Patient with compression wrap to left wrist, unable to assess incision due to wrap, left hip with staples Tegaderm in place, surgeon to change per patient and Pam Love, PA, left lateral leg with staples and dry dressing changed daily, bruising noted to face/chin, bilateral upper extremities and left left leg and great toe. Continue plan of care. [place], Khoa Opdahl Selinda Eon

## 2013-10-20 NOTE — Care Management Note (Signed)
Inpatient [facility] Individual Statement of Services  Patient Name:  Jenna Ramos  Date:  10/20/2013  Welcome to the [facility].  Our goal is to provide you with an individualized program based on your diagnosis and situation, designed to meet your specific needs.  With this comprehensive rehabilitation program, you will be expected to participate in at least 3 hours of rehabilitation therapies Monday-Friday, with modified therapy programming on the weekends.  Your rehabilitation program will include the following services:  Physical Therapy (PT), Occupational Therapy (OT), 24 hour per day rehabilitation nursing, Case Management (Social Worker), Rehabilitation Medicine, Nutrition Services and Pharmacy Services  Weekly team conferences will be held on Wednesday to discuss your progress.  Your Social Worker will talk with you frequently to get your input and to update you on team discussions.  Team conferences with you and your family in attendance may also be held.  Expected length of stay: 21 days Overall anticipated outcome: supervision/min level  Depending on your progress and recovery, your program may change. Your Social Worker will coordinate services and will keep you informed of any changes. Your Social Worker's name and contact numbers are listed  below.  The following services may also be recommended but are not provided by the [facility] will be made to provide these services after discharge if needed.  Arrangements include referral to agencies that provide these services.  Your insurance has been verified to be:  [employer] Your primary doctor is:  Cleda Mccreedy  Pertinent information will be shared with your doctor and your insurance company.  Social Worker:  Ovidio Kin, [place] or  ([phone]  Information discussed with and copy given to patient by: [name], 10/20/2013, 12:45 PM

## 2013-10-20 NOTE — Progress Notes (Signed)
80 year old female with history of HTN, RA, fibromyalgia, macular degeneration who caught her toe on the edge of a stair, tripped and fell on her steps on 10/12/13 with onset of severe left hip and left wrist pain and inability to ambulate. She was taken to [facility] ED and workup revealed displaced left femoral neck fracture and distal left radius and ulna fracture. X ray showed question of lytic lesion and NM body scan negative for metastasis. She underwent left hip bipolar hemi and ORIF left wrist by Dr. Derald Macleod on 10/14/13. Post op xrays done revealing small fracture distal to tip of porthesis on posterior aspect of femur and she was taken to OR for ORIF left periprosthetic fracture on 10/15/13. Post op posterior hip precautions X 2 months and is TTWB LLE X 6 weeks.    Subjective/Complaints: Felt warm last noc no other c/os, pain well controlled  Review of Systems - Negative except fever last noc, no dysuria, no cough  Objective: Vital Signs: Blood pressure 160/57, pulse 80, temperature 98.6 F (37 C), temperature source Oral, resp. rate 17, height 5' 3"  (1.6 m), weight 63.9 kg (140 lb 14 oz), SpO2 98.00%. No results found. Results for orders placed during the hospital encounter of 10/17/13 (from the past 72 hour(s))  URINALYSIS, ROUTINE W REFLEX MICROSCOPIC     Status: Abnormal   Collection Time    10/17/13  6:10 PM      Result Value Ref Range   Color, Urine YELLOW  YELLOW   APPearance CLOUDY (*) CLEAR   Specific Gravity, Urine 1.006  1.005 - 1.030   pH 8.0  5.0 - 8.0   Glucose, UA NEGATIVE  NEGATIVE mg/dL   Hgb urine dipstick SMALL (*) NEGATIVE   Bilirubin Urine NEGATIVE  NEGATIVE   Ketones, ur NEGATIVE  NEGATIVE mg/dL   Protein, ur NEGATIVE  NEGATIVE mg/dL   Urobilinogen, UA 1.0  0.0 - 1.0 mg/dL   Nitrite NEGATIVE  NEGATIVE   Leukocytes, UA NEGATIVE  NEGATIVE  URINE CULTURE     Status: None   Collection Time    10/17/13  6:10 PM      Result Value Ref Range   Specimen Description URINE, RANDOM     Special Requests NONE     Culture  Setup Time       Value: 10/18/2013 01:33     Performed at [facility] Count       Value: 30,000 COLONIES/ML     Performed at [facility]   Culture       Value: [facility] MORGANII     Performed at [facility]   Report Status 10/20/2013 FINAL     Organism ID, Bacteria MORGANELLA MORGANII    URINE MICROSCOPIC-ADD ON     Status: None   Collection Time    10/17/13  6:10 PM      Result Value Ref Range   Squamous Epithelial / LPF RARE  RARE   RBC / HPF 0-2  <3 RBC/hpf   Bacteria, UA RARE  RARE  CBC WITH DIFFERENTIAL     Status: Abnormal   Collection Time    10/18/13  4:53 AM      Result Value Ref Range   WBC 10.9 (*) 4.0 - 10.5 K/uL   RBC 3.39 (*) 3.87 - 5.11 MIL/uL   Hemoglobin 9.2 (*) 12.0 - 15.0 g/dL   HCT 28.1 (*) 36.0 - 46.0 %   MCV 82.9  78.0 - 100.0 fL   MCH 27.1  26.0 - 34.0 pg   MCHC 32.7  30.0 - 36.0 g/dL   RDW 14.6  11.5 - 15.5 %   Platelets 292  150 - 400 K/uL   Neutrophils Relative % 65  43 - 77 %   Neutro Abs 7.2  1.7 - 7.7 K/uL   Lymphocytes Relative 19  12 - 46 %   Lymphs Abs 2.0  0.7 - 4.0 K/uL   Monocytes Relative 11  3 - 12 %   Monocytes Absolute 1.2 (*) 0.1 - 1.0 K/uL   Eosinophils Relative 4  0 - 5 %   Eosinophils Absolute 0.5  0.0 - 0.7 K/uL   Basophils Relative 1  0 - 1 %   Basophils Absolute 0.1  0.0 - 0.1 K/uL  COMPREHENSIVE METABOLIC PANEL     Status: Abnormal   Collection Time    10/18/13  4:53 AM      Result Value Ref Range   Sodium 139  137 - 147 mEq/L   Potassium 3.9  3.7 - 5.3 mEq/L   Chloride 101  96 - 112 mEq/L   CO2 27  19 - 32 mEq/L   Glucose, Bld 140 (*) 70 - 99 mg/dL   BUN 15  6 - 23 mg/dL   Creatinine, Ser 0.63  0.50 - 1.10 mg/dL   Calcium 9.1  8.4 - 10.5 mg/dL   Total Protein 5.8 (*) 6.0 - 8.3 g/dL   Albumin 2.2 (*) 3.5 - 5.2 g/dL   AST 30  0 - 37 U/L   ALT 22  0 - 35 U/L   Alkaline Phosphatase 115  39 - 117 U/L    Total Bilirubin 0.6  0.3 - 1.2 mg/dL   GFR calc non Af Amer 81 (*) >90 mL/min   GFR calc Af Amer >90  >90 mL/min   Comment: (NOTE)     The eGFR has been calculated using the CKD EPI equation.     This calculation has not been validated in all clinical situations.     eGFR's persistently <90 mL/min signify possible Chronic Kidney     Disease.   Anion gap 11  5 - 15     HEENT: normal Cardio: RRR Resp: CTA B/L and unlabored GI: BS positive and Non distended Extremity:  Pulses positive and No Edema Skin:   Wound C/D/I Neuro: Alert/Oriented and Abnormal Motor Left grip 3-, 2- HF, 3- KE, 4/5 ankle Musc/Skel:  Swelling Left thigh Gen NAD   Assessment/Plan: 1. Functional deficits secondary to Left FNF, and radius fx which require 3+ hours per day of interdisciplinary therapy in a comprehensive inpatient rehab setting. Physiatrist is providing close team supervision and 24 hour management of active medical problems listed below. Physiatrist and rehab team continue to assess barriers to discharge/monitor patient progress toward functional and medical goals. FIM: FIM - Bathing Bathing Steps Patient Completed: Chest;Left Arm;Abdomen;Front perineal area;Right upper leg;Left upper leg;Right Arm;Buttocks Bathing: 3: Mod-Patient completes 5-7 38f10 parts or 50-74%  FIM - Upper Body Dressing/Undressing Upper body dressing/undressing steps patient completed: Pull shirt over trunk;Thread/unthread right sleeve of front closure shirt/dress;Pull shirt around back of front closure shirt/dress;Thread/unthread left sleeve of front closure shirt/dress;Button/unbutton shirt Upper body dressing/undressing: 4: Min-Patient completed 75 plus % of tasks FIM - Lower Body Dressing/Undressing Lower body dressing/undressing steps patient completed: Don/Doff right sock Lower body dressing/undressing: 2: Max-Patient completed 25-49% of tasks  FIM - Toileting Toileting steps completed by  patient: Performs perineal  hygiene;Adjust clothing prior to toileting Toileting Assistive Devices: Grab bar or rail for support Toileting: 3: Mod-Patient completed 2 of 3 steps  FIM - Radio producer Devices: Elevated toilet seat;Grab bars Toilet Transfers: 4-To toilet/BSC: Min A (steadying Pt. > 75%);4-From toilet/BSC: Min A (steadying Pt. > 75%)  FIM - Control and instrumentation engineer Devices: Walker;Arm rests (with L platform) Bed/Chair Transfer: 3: Supine > Sit: Mod A (lifting assist/Pt. 50-74%/lift 2 legs;4: Bed > Chair or W/C: Min A (steadying Pt. > 75%);3: Chair or W/C > Bed: Mod A (lift or lower assist)  FIM - Locomotion: Wheelchair Distance: 10 Locomotion: Wheelchair: 1: Total Assistance/staff pushes wheelchair (Pt<25%) FIM - Locomotion: Ambulation Locomotion: Ambulation Assistive Devices: Engineer, agricultural (L platform) Ambulation/Gait Assistance: 4: Min assist Locomotion: Ambulation: 1: Travels less than 50 ft with minimal assistance (Pt.>75%)  Comprehension Comprehension Mode: Auditory Comprehension: 5-Follows basic conversation/direction: With no assist  Expression Expression Mode: Verbal Expression: 5-Expresses basic needs/ideas: With no assist  Social Interaction Social Interaction: 7-Interacts appropriately with others - No medications needed.  Problem Solving Problem Solving: 5-Solves basic problems: With no assist  Memory Memory: 6-More than reasonable amt of time   Medical Problem List and Plan:  1. Functional deficits secondary to left femoral neck fracture, left femoral shaft fracture, distal left radius and ulna fracture.  2. DVT Prophylaxis/Anticoagulation: Pharmaceutical: Other (comment)--Eliquis. Check dopplers.  3. Pain Management: prn medications effective. History of fibromyalgia she thinks her doctors have tried different fibromyalgia medicines but is not sure which ones, consider gabapentin  4. Mood: LCSW to follow for evaluation  and support.  5. Neuropsych: This patient is capable of making decisions on her own behalf.  6. Skin/Wound Care: Routine pressure relief measures. Maintain adequate nutritional status. Will add nutritional supplement.  7. HTN: Will continue to hold Norvasc. Check orthostatic BP. Monitor BP on tid basis and further adjust medications as indicated. Continue benazepril daily.  8. ABLA: Will recheck labs on Monday. Continue iron supplement.  9. Hyponatremia/Hypokalemia: Resolving. Check follow up labs in am.  10. Hypoxia: Continue 2-3 liters Oxygen to keep saturation > 90%. Wean oxygen as tolerated. LOS (Days) 3 A FACE TO FACE EVALUATION WAS PERFORMED  KIRSTEINS,ANDREW E 10/20/2013, 8:23 AM

## 2013-10-20 NOTE — Progress Notes (Signed)
Recreational Therapy Assessment and Plan  Patient Details  Name: Jenna Ramos MRN: [id number] Date of Birth: [birth year] Today's Date: 10/20/2013  Rehab Potential: Good ELOS: 3 weeks   Assessment Clinical Impression: Problem List:  Patient Active Problem List    Diagnosis  Date Noted   .  Hip fracture requiring operative repair  10/17/2013   .  Anemia     Past Medical History:  Past Medical History   Diagnosis  Date   .  Rectal incontinence    .  Hypertension    .  Arthritis    .  Hypothyroidism    .  Anemia    .  Fibromyalgia    .  Glaucoma    .  Macular degeneration    .  HOH (hard of hearing)     Past Surgical History:  Past Surgical History   Procedure  Laterality  Date   .  Tubal ligation      Assessment & Plan  Clinical Impression: Ms. Jenna Ramos is an 80 year old female with history of HTN, RA, fibromyalgia, macular degeneration who caught her toe on the edge of a stair, tripped and fell on her steps on 10/12/13 with onset of severe left hip and left wrist pain and inability to ambulate. She was taken to [facility] ED and workup revealed displaced left femoral neck fracture and distal left radius and ulna fracture. X ray showed question of lytic lesion and NM body scan negative for metastasis. She underwent left hip bipolar hemi and ORIF left wrist by Dr. Derald Macleod on 10/14/13. Post op xrays done revealing small fracture distal to tip of porthesis on posterior aspect of femur and she was taken to OR for ORIF left periprosthetic fracture on 10/15/13. Post op posterior hip precautions X 2 months and is TTWB LLE X 6 weeks. Patient transferred to CIR on 10/17/2013.   Pt presents with decreased activity tolerance, decreased functional mobility, decreased balance, difficulty maintaining precautions Limiting pt's independence with leisure/community pursuits.   Leisure History/Participation Premorbid leisure interest/current participation: Petra Kuba -  Vegetable gardening;Nature - Flower gardening;Crafts - Painting;Nature - Other (Comment);Nature - [employer] care;Community - Shopping mall;Community - Grocery store;Community - Travel (Comment) Other Leisure Interests: Reading;Cooking/Baking;Housework Leisure Participation Style: With Family/Friends Awareness of Community Resources: Excellent Psychosocial / Spiritual Does patient have pets?: Yes Social interaction - Mood/Behavior: Cooperative Academic librarian Appropriate for Education?: Yes Recreational Therapy Orientation Orientation -Reviewed with patient: Available activity resources Strengths/Weaknesses Patient Strengths/Abilities: Willingness to participate;Active premorbidly Patient weaknesses: Physical limitations TR Patient demonstrates impairments in the following area(s): Endurance;Motor;Pain  Plan Rec Therapy Plan Is patient appropriate for Therapeutic Recreation?: Yes Rehab Potential: Good Treatment times per week: Min 1 time per week >20 minutes Estimated Length of Stay: 3 weeks TR Treatment/Interventions: Adaptive equipment instruction;1:1 session;Balance/vestibular training;Functional mobility training;Community reintegration;Patient/family education;Therapeutic activities;Recreation/leisure participation;Therapeutic exercise;UE/LE Coordination activities  Recommendations for other services: None  Discharge Criteria: Patient will be discharged from TR if patient refuses treatment 3 consecutive times without medical reason.  If treatment goals not met, if there is a change in medical status, if patient makes no progress towards goals or if patient is discharged from hospital.  The above assessment, treatment plan, treatment alternatives and goals were discussed and mutually agreed upon: by patient  [place] 10/20/2013, 2:52 PM

## 2013-10-20 NOTE — Discharge Instructions (Addendum)
Inpatient Rehab Discharge Instructions  [place] Discharge date and time: 10/31/13   Activities/Precautions/ Functional Status: Activity: activity as tolerated with touch down weight on Left leg Diet: regular diet Wound Care: keep wound clean and dry  Functional status:  ___ No restrictions     ___ Walk up steps independently _X__ 24/7 supervision/assistance   ___ Walk up steps with assistance ___ Intermittent supervision/assistance  ___ Bathe/dress independently ___ Walk with walker     _X__ Bathe/dress with assistance ___ Walk Independently    ___ Shower independently ___ Walk with assistance    ___ Shower with assistance _X__ No alcohol     ___ Return to work/school ________  Special Instructions: 1. Left total hip precautions.  2. Keep splint left wrist clean and dry   COMMUNITY REFERRALS UPON DISCHARGE:    Home Health:   PT, OT, RN, [place]  KPVVZ:[phone] Date of last service:10/30/2013  Medical Equipment/Items Ordered:WHEELCHAIR, PLATFORM ROLLING WALKER, 3IN1, [place] LIST    My questions have been answered and I understand these instructions. I will adhere to these goals and the provided educational materials after my discharge from the hospital.  Patient/Caregiver Signature _______________________________ Date __________  Clinician Signature _______________________________________ Date __________  Please bring this form and your medication list with you to all your follow-up doctor's appointments.     Information on my medicine - ELIQUIS (apixaban)  This medication education was reviewed with me or my healthcare representative as part of my discharge preparation.   Why was Eliquis prescribed for you? Eliquis was prescribed for you to reduce the risk of blood clots forming after orthopedic surgery.    What do You need to know about  Eliquis? Take your Eliquis 2.5 mg TWICE DAILY - one tablet in the morning and one tablet in the evening with or without food.  It would be best to take the dose about the same time each day.  If you have difficulty swallowing the tablet whole please discuss with your pharmacist how to take the medication safely.  Take Eliquis exactly as prescribed by your doctor and DO NOT stop taking Eliquis without talking to the doctor who prescribed the medication.  Stopping without other medication to take the place of Eliquis may increase your risk of developing a clot.  After discharge, you should have regular check-up appointments with your healthcare provider that is prescribing your Eliquis.  What do you do if you miss a dose? If a dose of ELIQUIS is not taken at the scheduled time, take it as soon as possible on the same day and twice-daily administration should be resumed.  The dose should not be doubled to make up for a missed dose.  Do not take more than one tablet of ELIQUIS at the same time.  Important Safety Information A possible side effect of Eliquis is bleeding. You should call your healthcare provider right away if you experience any of the following:   Bleeding from an injury or your nose that does not stop.   Unusual colored urine (red or dark brown) or unusual colored stools (red or black).   Unusual bruising for unknown reasons.   A serious fall or if you hit your head (even if there is no bleeding).  Some medicines may interact with Eliquis and might increase your risk of bleeding or clotting while on Eliquis. To help avoid this, consult your healthcare provider or  pharmacist prior to using any new prescription or non-prescription medications, including herbals, vitamins, non-steroidal anti-inflammatory drugs (NSAIDs) and supplements.  This website has more information on Eliquis (apixaban): http://www.eliquis.com/eliquis/home

## 2013-10-20 NOTE — Progress Notes (Signed)
Physical Therapy Session Note  Patient Details  Name: Jenna Ramos MRN: [id number] Date of Birth: April 17, 1928  Today's Date: 10/20/2013 PT Individual Time: 1103-1203 PT Individual Time Calculation (min): 60 min   Short Term Goals: Week 1:  PT Short Term Goal 1 (Week 1): Pt will demonstrate supine to sit transfer req min A.  PT Short Term Goal 2 (Week 1): Pt will demonstrate bed to w/c transfer req CGA with L platform walker.  PT Short Term Goal 3 (Week 1): Pt will ambulate 20' with L platform walker, while maintain precautions, req CGA.  PT Short Term Goal 4 (Week 1): Pt will self propel manual w/c x 50' req min A.  PT Short Term Goal 5 (Week 1): Pt will ascend/descend 1 stair with rail req mod A.   Skilled Therapeutic Interventions/Progress Updates:    Session focused on gait training, functional mobility, LLE strengthening. Pt received supine in bed agreeable to participate in therapy. Pt able to recall 3/3 hip precautions independently. Pt moved supine>sit w/ MinA for managing LLE. Pt w/ MaxA to manage w/c parts, S for w/c propulsion. Pt propelled w/c 150' to hallway outside rehab gym. Pt ambulated 10'x2 w/ PFRW w/ MinA to maintain TTWB on LLE and advance RLE. Pt demonstrated difficulty unweighting R foot enough to advance limb, relied on short shuffles and toe flexion to advance. Educated pt on using RW to [employer] leg, pt completed x5 standing push ups on RW to clear R foot from floor. Pt complained of increased pain in R hip during task. Pt able to complete task without breaking WBing precautions on LLE. Pt w/ seated LAQ 2x10 on L, supine SAQ and isometric hip adduction 2x10 on L. Pt w/ several SPT throughout session w/ PFRW w/ MinA for all utilizing shuffling technique on R foot. Pt left seated in w/c w/ daughter in law present w/ all needs within reach.  Therapy Documentation Precautions:  Precautions Precautions: Posterior Hip;Fall;Other (comment) Precaution Comments: L forearm  casted - NWB Restrictions Weight Bearing Restrictions: Yes LUE Weight Bearing: Weight bear through elbow only LLE Weight Bearing: Touchdown weight bearing Other Position/Activity Restrictions: currently using platform walker on L UE Pain: Pain Assessment Pain Assessment: 0-10 Pain Score: 5  Pain Type: Surgical pain;Acute pain Pain Location: Hip Pain Orientation: Left Pain Descriptors / Indicators: Aching Pain Frequency: Intermittent Pain Onset: Gradual Patients Stated Pain Goal: 3 Pain Intervention(s): Medication (See eMAR)  See FIM for current functional status  Therapy/Group: Individual Therapy  Rada Hay Rada Hay, PT, DPT 10/20/2013, 3:25 PM

## 2013-10-20 NOTE — Progress Notes (Signed)
Social Work Assessment and Plan Social Work Assessment and Plan  Patient Details  Name: Jenna Ramos MRN: [id number] Date of Birth: 03-Dec-1928  Today's Date: 10/20/2013  Problem List:  Patient Active Problem List   Diagnosis Date Noted  . Hip fracture requiring operative repair 10/17/2013  . Anemia    Past Medical History:  Past Medical History  Diagnosis Date  . Rectal incontinence   . Hypertension   . Arthritis   . Hypothyroidism   . Anemia   . Fibromyalgia   . Glaucoma   . Macular degeneration   . HOH (hard of hearing)    Past Surgical History:  Past Surgical History  Procedure Laterality Date  . Tubal ligation     Social History:  has no tobacco, alcohol, and drug history on file.  Family / Support Systems Marital Status: Widow/Widower Patient Roles: Parent Children: Son and D-I-L  Jenna Ramos  [phone] Other Supports: Friends and church members Anticipated Caregiver: Son and D-I-L along with hired assist Ability/Limitations of Caregiver: Family trying to piece together a Physicist, medical Availability: Other (Comment) (family working on a plan) Family Dynamics: Close knit family who live next door to each other.  They will pull together to get through this healing time for pt.  Pt reports she wants to do as much as she can for herself, but realizes her limitations.  Social History Preferred language: English Religion: Protestant Cultural Background: No issues Education: High School Read: Yes Write: Yes Employment Status: Retired Freight forwarder Issues: No issues Guardian/Conservator: None-according to MD pt is capable of mkaing her own decisions while here.   Abuse/Neglect Physical Abuse: Denies Verbal Abuse: Denies Sexual Abuse: Denies Exploitation of patient/patient's resources: Denies Self-Neglect: Denies  Emotional Status Pt's affect, behavior adn adjustment status: Pt is motivated to improve and recover and regain as much  independence as possible before discharging from here.  She has always been independent and taken care of herself.  She is active and wants to resume these activities once she is healed. Recent Psychosocial Issues: manages her other health issues and remianed independent Pyschiatric History: No history deferred depression screen due to doing well and coping appropriately at this time.  Will intervene if necessary and rely upon the team's input if needed. Substance Abuse History: No issues  Patient / Family Perceptions, Expectations & Goals Pt/Family understanding of illness & functional limitations: Pt and Sandy can explain her fractures and WB issues.  They are trying to develop a plan for when it is time for pt to go home.  Clsoe knit family who care about one another and will do for thee other.  Pt asks her questions and feels her concerns are bveing addressed. Premorbid pt/family roles/activities: Mother, Retiree, Home owner, church member, etc Anticipated changes in roles/activities/participation: resume Pt/family expectations/goals: Pt states: " I want to be able to do as much as I can for myself, before I leave here."  Jenna Ramos states: " We will come up with a plan for her until she heals and do what we can."  [employer]: None Premorbid Home Care/DME Agencies: None Transportation available at discharge: Family  Discharge Planning Living Arrangements: [place]: Children;Other relatives;Friends/neighbors;Church/faith community Type of Residence: Private residence Insurance Resources: Education officer, museum (specify) Nurse, mental health) Financial Resources: Social Security Financial Screen Referred: No Living Expenses: Own Money Management: Patient Does the patient have any problems obtaining your medications?: No Home Management: Self Patient/Family Preliminary Plans: Return home wiht fmaily and friends to assist,  along with private duty.  Both aware pt  will require 24 hr for a short time until heals and can weight bear more.  Jenna Ramos looking into hiring some assist, now so will be in place when returns home.  Await team conference results. Social Work Anticipated Follow Up Needs: HH/OP  Clinical Impression Very pleasant motivated active female who is very independent prior to this fall.  Supportive son and daughter in-law who are willing to assist her, until she is healed. Aware she will need 24 hr care at first and are trying to arrange for this.  Team conference Wed will inform goals and target discharge date.  Will provide support to pt While here and coordinate a safe discharge plan.  Jenna Ramos 10/20/2013, 1:10 PM

## 2013-10-20 NOTE — Progress Notes (Signed)
Physical Therapy Session Note  Patient Details  Name: Jenna Ramos MRN: [id number] Date of Birth: 11-03-1928  Today's Date: 10/20/2013 PT Individual Time: 1405-1510 PT Individual Time Calculation (min): 65 min   Short Term Goals: Week 1:  PT Short Term Goal 1 (Week 1): Pt will demonstrate supine to sit transfer req min A.  PT Short Term Goal 2 (Week 1): Pt will demonstrate bed to w/c transfer req CGA with L platform walker.  PT Short Term Goal 3 (Week 1): Pt will ambulate 20' with L platform walker, while maintain precautions, req CGA.  PT Short Term Goal 4 (Week 1): Pt will self propel manual w/c x 50' req min A.  PT Short Term Goal 5 (Week 1): Pt will ascend/descend 1 stair with rail req mod A.   Skilled Therapeutic Interventions/Progress Updates:  Pt rated pain L hip 4/10; premedicated.  Reviewed hip precautons; pt able to state 3/3.  Therapist educated pt on avoiding L trunk rotation on stationary LLE and trunk flexion in standing, as well.  Therapeutic exercise performed with LE to increase strength for functional mobility: in supine: 20 x 1 bil ankle pumps, ankle circles; 10 x 1 L hip abd/add, heel slides; 5 x 2 short arc L knee ext. Pain L hip limited exs. Instructed pt in breathing coordination during performance of exs that caused pain.  Bed mobility- see FIM.  Gait training with RW, L platform in room, with min assist sit> stand from raised bed, x 10' with min/mod assist including 2 turns to L , max VCs for negotiating RW around sink and approaching w/c.  Switched out w/c cushion for one with more support.   Toilet transfer with min assist, see FIM.  Urine noted to be cloudy; informed RN.    Pt returned to bed and made comfortable; all needs in place. Pt's R hip sore from excessive stress of fall and increased wt bearing due to L hip fx.    Therapy Documentation Precautions:  Precautions Precautions: Posterior Hip;Fall;Other (comment) Precaution Comments: L forearm  casted - NWB Restrictions Weight Bearing Restrictions: Yes LUE Weight Bearing: Weight bear through elbow only LLE Weight Bearing: Touchdown weight bearing Other Position/Activity Restrictions: currently using platform walker on L UE Ambulation Ambulation/Gait Assistance: 3: Mod assist    See FIM for current functional status  Therapy/Group: Individual Therapy  Britton Bera 10/20/2013, 3:47 PM

## 2013-10-20 NOTE — Progress Notes (Signed)
Patient information reviewed and entered into [facility] by Becky Maxon Kresse, covering PPS coordinator. Information including medical coding and functional independence measure will be reviewed and updated through discharge.  Per nursing, patient was given "Data Collection Information Summary for Patients in Inpatient Rehabilitation Facilities with attached Privacy Act Statement Health Care Records" upon admission.     

## 2013-10-20 NOTE — IPOC Note (Addendum)
Overall Plan of Care [facility]) Patient Details Name: Jenna Ramos MRN: [id number] DOB: February 16, 1929  Admitting Diagnosis: Hosp-Fem wrist [facility] Problems: Active Problems:   Hip fracture requiring operative repair   Anemia     Functional Problem List: Nursing Behavior;Bladder;Bowel;Edema;Endurance;Medication Management;Motor;Nutrition;Pain;Perception;Safety;Sensory;Skin Integrity  PT Balance;Pain;Edema;Safety;Endurance;Motor;Skin Integrity  OT Balance;Endurance;Motor;Pain  SLP    TR Activity tolerance, functional mobility, balance, safety, pain        Basic ADL's: OT Grooming;Bathing;Dressing;Toileting     Advanced  ADL's: OT Simple Meal Preparation;Laundry     Transfers: PT Bed Mobility;Bed to Chair;Car  OT Toilet;Tub/Shower     Locomotion: PT Ambulation;Wheelchair Mobility;Stairs     Additional Impairments: OT None  SLP        TR      Anticipated Outcomes Item Anticipated Outcome  Self Feeding independent  Swallowing      Basic self-care  supervision/set up  Toileting  supervision   Bathroom Transfers mod I to toilet transfer; min a to tub bench  Bowel/Bladder  Min A  Transfers  Mod I squat pivot or stand pivot transfers  Locomotion  short distances with L platform RW req Supervision  Communication     Cognition     Pain  <3 on a 0-10 scale  Safety/Judgment  min A   Therapy Plan: PT Intensity: Minimum of 1-2 x/day ,45 to 90 minutes PT Frequency: 5 out of 7 days PT Duration Estimated Length of Stay: 21 days OT Intensity: Minimum of 1-2 x/day, 45 to 90 minutes OT Frequency: 5 out of 7 days OT Duration/Estimated Length of Stay: 21 days TR Duration/ELOS:  3 weeks TR Frequency:  Min 1 time per week >20 minutes           Team Interventions: Nursing Interventions Patient/Family Education;Bladder Management;Bowel Management;Disease Management/Prevention;Pain Management;Medication Management;Skin Care/Wound Management;Discharge  Planning;Psychosocial Support  PT interventions Ambulation/gait training;Discharge planning;DME/adaptive equipment instruction;Functional mobility training;Pain management;Psychosocial support;Therapeutic Activities;UE/LE Strength taining/ROM;Balance/vestibular training;Disease management/prevention;Neuromuscular re-education;Patient/family education;Stair training;Therapeutic Exercise;UE/LE Coordination activities;Wheelchair propulsion/positioning  OT Interventions Balance/vestibular training;Community reintegration;Discharge planning;DME/adaptive equipment instruction;Functional mobility training;Pain management;Patient/family education;Self Care/advanced ADL retraining;Therapeutic Activities;Therapeutic Exercise;UE/LE Strength taining/ROM;UE/LE Coordination activities;Wheelchair propulsion/positioning  SLP Interventions    TR Interventions Recreation/leisure participation, Training and development officer, functional mobility, therapeutic activities, UE/LE strength/coordination, community reintegration, pt/family education, adaptive equipment instruction/use, discharge planning, psychosocial support   SW/CM Interventions      Team Discharge Planning: Destination: PT-Home ,OT- Home , SLP-  Projected Follow-up: PT-Home health PT, OT-  Home health OT, SLP-  Projected Equipment Needs: PT-Wheelchair (measurements);Wheelchair cushion (measurements);To be determined;Rolling walker with 5" wheels (RW with L platform), OT- Tub/shower bench;3 in 1 bedside comode, SLP-  Equipment Details: PT- , OT-3n1 over toilet Patient/family involved in discharge planning: PT- Patient,  OT-Patient, SLP-   MD ELOS: 10-12d   Medical Rehab Prognosis:  Good Assessment: 80 year old female with history of HTN, RA, fibromyalgia, macular degeneration who caught her toe on the edge of a stair, tripped and fell on her steps on 10/12/13 with onset of severe left hip and left wrist pain and inability to ambulate. She was taken to  [facility] ED and workup revealed displaced left femoral neck fracture and distal left radius and ulna fracture. X ray showed question of lytic lesion and NM body scan negative for metastasis. She underwent left hip bipolar hemi and ORIF left wrist by Dr. Derald Macleod on 10/14/13. Post op xrays done revealing small fracture distal to tip of porthesis on posterior aspect of femur and she was taken to  OR for ORIF left periprosthetic fracture on 10/15/13. Post op posterior hip precautions X 2 months and is TTWB LLE X 6 weeks  Now requiring 24/7 Rehab RN,MD, as well as CIR level PT, OT .  Treatment team will focus on ADLs and mobility with goals set at Sup     See Team Conference Notes for weekly updates to the plan of care

## 2013-10-20 NOTE — Progress Notes (Signed)
Occupational Therapy Session Note  Patient Details  Name: Jenna Ramos MRN: [id number] Date of Birth: 10/14/1928  Today's Date: 10/20/2013 Time: 3893-7342 Time Calculation (min): 61 min  Short Term Goals: Week 1:  OT Short Term Goal 1 (Week 1): Pt. will utilized AE for LB Bathing and dressing with minimal assist. OT Short Term Goal 2 (Week 1): Pt will bath LB with min assist plus AE. OT Short Term Goal 3 (Week 1): Pt. will be minimal assist with LB dressing OT Short Term Goal 4 (Week 1): Pt. will recall hip precautions and TTWB with one questioning cue OT Short Term Goal 5 (Week 1): Pt.  will adhere to hip precautions when doing bed,to walker to toilet transfers  Skilled Therapeutic Interventions/Progress Updates:      Pt seen for BADL retraining of toileting, bathing, and dressing with a focus on safe functional mobility. Pt able to recall 3/3 hip precautions and was fully aware of WB precautions. Pt overall needed min A with transfers bed to w/c, w/c >< toilet (BSC over toilet), and sit >< stand. Pt had increased pain in sitting with her LLE hanging and needed support from leg rest and/or pillow on the floor. Pt continues to require max A with LB self care due to pain in standing and decreased balance with TDWB.  Overall, pt demonstrated good activity tolerance with only minimal rest breaks. Pt sitting in chair at end of session, concerned she will be in too much pain and requesting to get back in bed. Advised pt to try to sit for at least 45 min. She could then call for assistance to transfer back to bed if uncomfortable. Pt was agreeable. Reviewed weight shifting in w/c to relieve pressure. Pt will call light and phone in reach.  Therapy Documentation Precautions:  Precautions Precautions: Posterior Hip;Fall;Other (comment) Precaution Comments: L forearm casted - NWB Restrictions Weight Bearing Restrictions: Yes LUE Weight Bearing: Weight bear through elbow only LLE Weight  Bearing: Touchdown weight bearing Other Position/Activity Restrictions: currently using platform walker on L UE    Pain: Pain Assessment Pain Assessment: 0-10 Pain Score: 2  Pain Type: Acute pain Pain Location: Hand Pain Orientation: Left Pain Radiating Towards: elbow Pain Descriptors / Indicators: Discomfort Pain Frequency: Intermittent Pain Onset: With Activity Patients Stated Pain Goal: 1 Pain Intervention(s): Repositioned ADL: See FIM for current functional status  Therapy/Group: Individual Therapy  [place] 10/20/2013, 9:29 AM

## 2013-10-21 ENCOUNTER — Inpatient Hospital Stay (HOSPITAL_COMMUNITY): Payer: Medicare Other

## 2013-10-21 ENCOUNTER — Inpatient Hospital Stay (HOSPITAL_COMMUNITY): Payer: Medicare Other | Admitting: Physical Therapy

## 2013-10-21 ENCOUNTER — Inpatient Hospital Stay (HOSPITAL_COMMUNITY): Payer: Medicare Other | Admitting: *Deleted

## 2013-10-21 ENCOUNTER — Encounter (HOSPITAL_COMMUNITY): Payer: Federal, State, Local not specified - PPO | Admitting: Occupational Therapy

## 2013-10-21 DIAGNOSIS — D649 Anemia, unspecified: Secondary | ICD-10-CM

## 2013-10-21 DIAGNOSIS — S72009A Fracture of unspecified part of neck of unspecified femur, initial encounter for closed fracture: Secondary | ICD-10-CM

## 2013-10-21 NOTE — Progress Notes (Signed)
80 year old female with history of HTN, RA, fibromyalgia, macular degeneration who caught her toe on the edge of a stair, tripped and fell on her steps on 10/12/13 with onset of severe left hip and left wrist pain and inability to ambulate. She was taken to [facility] ED and workup revealed displaced left femoral neck fracture and distal left radius and ulna fracture. X ray showed question of lytic lesion and NM body scan negative for metastasis. She underwent left hip bipolar hemi and ORIF left wrist by Dr. Derald Macleod on 10/14/13. Post op xrays done revealing small fracture distal to tip of porthesis on posterior aspect of femur and she was taken to OR for ORIF left periprosthetic fracture on 10/15/13. Post op posterior hip precautions X 2 months and is TTWB LLE X 6 weeks.    Subjective/Complaints: Left arm and leg tolerable. Her right hip has been bothering her more since yesterday. Left lateral ankle also sore.  Review of Systems - Negative except fever last noc, no dysuria, no cough  Objective: Vital Signs: Blood pressure 179/64, pulse 82, temperature 99.2 F (37.3 C), temperature source Oral, resp. rate 18, height 5\' 3"  (1.6 m), weight 63.9 kg (140 lb 14 oz), SpO2 95.00%. No results found. No results found for this or any previous visit (from the past 72 hour(s)).   HEENT: normal Cardio: RRR Resp: CTA B/L and unlabored GI: BS positive and Non distended Extremity:  Pulses positive and No Edema Skin:   Wound C/D/I Neuro: Alert/Oriented and Abnormal Motor Left grip 3-, 2- HF, 3- KE, 4/5 ankle Musc/Skel:  Swelling Left thigh improved. Mild swelling left fingers -right TFL tender to palpation and with HF/abduction. No inguinal/joint tenderness. -left distal fib is sore. No swelling or obvious bruising Gen NAD   Assessment/Plan: 1. Functional deficits secondary to Left FNF, and radius fx which require 3+ hours per day of interdisciplinary therapy in a comprehensive inpatient  rehab setting. Physiatrist is providing close team supervision and 24 hour management of active medical problems listed below. Physiatrist and rehab team continue to assess barriers to discharge/monitor patient progress toward functional and medical goals. FIM: FIM - Bathing Bathing Steps Patient Completed: Chest;Right Arm;Left Arm;Abdomen;Front perineal area;Right upper leg;Left upper leg Bathing: 3: Mod-Patient completes 5-7 38f 10 parts or 50-74%  FIM - Upper Body Dressing/Undressing Upper body dressing/undressing steps patient completed: Put head through opening of pull over shirt/dress;Thread/unthread left sleeve of pullover shirt/dress;Thread/unthread right sleeve of pullover shirt/dresss;Pull shirt over trunk Upper body dressing/undressing: 5: Set-up assist to: Obtain clothing/put away FIM - Lower Body Dressing/Undressing Lower body dressing/undressing steps patient completed: Thread/unthread right underwear leg Lower body dressing/undressing: 2: Max-Patient completed 25-49% of tasks  FIM - Toileting Toileting steps completed by patient: Performs perineal hygiene Toileting Assistive Devices: Grab bar or rail for support Toileting: 2: Max-Patient completed 1 of 3 steps  FIM - Radio producer Devices: Grab bars;Elevated toilet seat Toilet Transfers: 4-From toilet/BSC: Min A (steadying Pt. > 75%);4-To toilet/BSC: Min A (steadying Pt. > 75%)  FIM - Control and instrumentation engineer Devices: Walker;Arm rests Bed/Chair Transfer: 4: Chair or W/C > Bed: Min A (steadying Pt. > 75%);4: Bed > Chair or W/C: Min A (steadying Pt. > 75%);4: Supine > Sit: Min A (steadying Pt. > 75%/lift 1 leg);4: Sit > Supine: Min A (steadying pt. > 75%/lift 1 leg)  FIM - Locomotion: Wheelchair Distance: 10 Locomotion: Wheelchair: 1: Total Assistance/staff pushes wheelchair (Pt<25%) FIM - Locomotion: Ambulation Locomotion: Ambulation  Assistive Devices: Teaching laboratory technician Ambulation/Gait Assistance: 3: Mod assist Locomotion: Ambulation: 1: Travels less than 50 ft with moderate assistance (Pt: 50 - 74%)  Comprehension Comprehension Mode: Auditory Comprehension: 5-Follows basic conversation/direction: With no assist  Expression Expression Mode: Verbal Expression: 5-Expresses basic needs/ideas: With no assist  Social Interaction Social Interaction: 7-Interacts appropriately with others - No medications needed.  Problem Solving Problem Solving: 5-Solves basic problems: With no assist  Memory Memory: 6-More than reasonable amt of time   Medical Problem List and Plan:  1. Functional deficits secondary to left femoral neck fracture, left femoral shaft fracture, distal left radius and ulna fracture.  2. DVT Prophylaxis/Anticoagulation: Pharmaceutical: Other (comment)--Eliquis. Check dopplers.  3. Pain Management: prn medications effective. History of fibromyalgia she thinks her doctors have tried different fibromyalgia medicines but is not sure which ones, consider gabapentin   -advised ice/heat to right TFL (overuse/technique issue most likely)  -will check xray of left lower fibula to rule out an occult fx 4. Mood: LCSW to follow for evaluation and support.  5. Neuropsych: This patient is capable of making decisions on her own behalf.  6. Skin/Wound Care: Routine pressure relief measures. Maintain adequate nutritional status. Will add nutritional supplement.  7. HTN: Will continue to hold Norvasc. Check orthostatic BP. Monitor BP on tid basis and further adjust medications as indicated. Continue benazepril daily.  8. ABLA: Will recheck labs on Monday. Continue iron supplement.  9. Hyponatremia/Hypokalemia: Resolving. Check follow up labs in am.  10. Hypoxia: Continue 2-3 liters Oxygen to keep saturation > 90%. Wean oxygen as tolerated. LOS (Days) 4 A FACE TO FACE EVALUATION WAS PERFORMED  Zakiyah Diop T 10/21/2013, 9:01 AM

## 2013-10-21 NOTE — Progress Notes (Signed)
Occupational Therapy Session Note  Patient Details  Name: Jenna Ramos MRN: [id number] Date of Birth: [birth year]  Today's Date: 10/21/2013 OT Individual Time: 0905-1007 OT Individual Time Calculation (min): 62 min   Short Term Goals: Week 1:  OT Short Term Goal 1 (Week 1): Pt. will utilized AE for LB Bathing and dressing with minimal assist. OT Short Term Goal 2 (Week 1): Pt will bath LB with min assist plus AE. OT Short Term Goal 3 (Week 1): Pt. will be minimal assist with LB dressing OT Short Term Goal 4 (Week 1): Pt. will recall hip precautions and TTWB with one questioning cue OT Short Term Goal 5 (Week 1): Pt.  will adhere to hip precautions when doing bed,to walker to toilet transfers  Skilled Therapeutic Interventions/Progress Updates:      Pt seen for BADL retraining of toileting, bathing, and dressing with a focus on activity tolerance, dynamic balance, and safe functional mobility with hip precautions and WB status.  Pt needed to move more slowly and cautiously but was able to do more today. She was able to use PFRW from bed to door of bathroom before feeling extremely fatigued. The bathroom transfers were then completed from w/c level. Pt was able to tolerate standing with TDWB on LLE to wash bottom and manage clothing over hips.  She did get SOB frequently and needed frequent rest breaks.  She states she is tolerating the pain better. Pt completed self care and rested in w/c with arm positioned on pillow and all needs in place.  Therapy Documentation Precautions:  Precautions Precautions: Posterior Hip;Fall;Other (comment) Precaution Comments: L forearm casted - NWB Restrictions Weight Bearing Restrictions: Yes LUE Weight Bearing: Weight bear through elbow only LLE Weight Bearing: Touchdown weight bearing Other Position/Activity Restrictions: currently using platform walker on L UE   Pain: Pain Assessment Pain Assessment: 0-10 Pain Score: 2  Pain Type: Acute  pain Pain Location: Arm Pain Orientation: Left Pain Radiating Towards: 5th finger Pain Descriptors / Indicators: Aching Pain Frequency: Intermittent Pain Onset: Gradual Patients Stated Pain Goal: 0 Pain Intervention(s): Medication (See eMAR) ADL: See FIM for current functional status  Therapy/Group: Individual Therapy  [place] 10/21/2013, 9:42 AM

## 2013-10-21 NOTE — Progress Notes (Signed)
Physical Therapy Session Note  Patient Details  Name: Jenna Ramos MRN: [id number] Date of Birth: [birth year]  Today's Date: 10/21/2013 PT Individual Time: 1300-1400 PT Individual Time Calculation (min): 60 min   Short Term Goals: Week 1:  PT Short Term Goal 1 (Week 1): Pt will demonstrate supine to sit transfer req min A.  PT Short Term Goal 2 (Week 1): Pt will demonstrate bed to w/c transfer req CGA with L platform walker.  PT Short Term Goal 3 (Week 1): Pt will ambulate 20' with L platform walker, while maintain precautions, req CGA.  PT Short Term Goal 4 (Week 1): Pt will self propel manual w/c x 50' req min A.  PT Short Term Goal 5 (Week 1): Pt will ascend/descend 1 stair with rail req mod A.   Skilled Therapeutic Interventions/Progress Updates:  Pt. Received semi-reclined in bed finished having lunch. Pt. Agreeable to physical therapy session. Pt. Indicated she needed to void upon supine>sit. Pt. Very alert and oriented x4.  1:1 Treatment focused on functional transfers adhering to L hip precautions and WB status (TTWB), and general BLE strengthening. Pt. Assist level is mod A for stability and maintaining WB precautions with RW. Treatment consisted of functional transfers: supine<>sit, sit<>stand, Bed<>BSC, stand<>w/c during session requiring mod A for safety and steady. Toileting required max A for stability and maintaining WB status on L side with therapist supporting L wrist on shoulder. Pt. Provided unassisted pericare during session. Pt. Ambulated <5 feet with RW with L platform installed; fatigued quickly requiring seated rest/recovery break. Pt. C/o increased pain in LUE/LLE due to activity and required repositioning. Pt. Expressed precautions x3 for L Hip and maintained safety during session; required additional therapist assisted L limb movement and extra time to perform tasks. Pt required bed rail for transfers and therapist aided descent control due to R leg  cramping. Pt. Positioned supine in bed with L wrist elevated and all needs placed on R side of patient with additional coaching on RN call bell on remote, and location on bed rail.  Pain: see below.   Therapy Documentation Precautions:  Precautions Precautions: Posterior Hip;Fall;Other (comment) Precaution Comments: L forearm casted - NWB Restrictions Weight Bearing Restrictions: Yes LUE Weight Bearing: Weight bear through elbow only LLE Weight Bearing: Touchdown weight bearing Other Position/Activity Restrictions: currently using platform walker on L UE  Pain: Pain Assessment Pain Assessment: 0-10 Pain Score: 3  Pain Type: Acute pain Pain Location: Leg Pain Orientation: Left Pain Radiating Towards: hip Pain Descriptors / Indicators: Constant;Dull;Discomfort Pain Frequency: Intermittent Pain Onset: With Activity Patients Stated Pain Goal: 1 Pain Intervention(s): Repositioned;Rest Multiple Pain Sites: Yes 2nd Pain Site Pain Score: 3 Pain Type: Acute pain Pain Location: Wrist Pain Orientation: Left Pain Descriptors / Indicators: Nagging;Dull Pain Onset: With Activity Patient's Stated Pain Goal: 2 Pain Intervention(s): Rest;Repositioned  Locomotion : Ambulation Ambulation/Gait Assistance: 3: Mod assist Wheelchair Mobility Distance: 15   See FIM for current functional status  Therapy/Group: Individual Therapy  Juluis Mire 10/21/2013, 2:11 PM

## 2013-10-21 NOTE — Progress Notes (Signed)
Physical Therapy Session Note  Patient Details  Name: Jenna Ramos MRN: [id number] Date of Birth: December 29, 1928  Today's Date: 10/21/2013 PT Individual Time: 0350-0938 PT Individual Time Calculation (min): 60 min   Short Term Goals: Week 1:  PT Short Term Goal 1 (Week 1): Pt will demonstrate supine to sit transfer req min A.  PT Short Term Goal 2 (Week 1): Pt will demonstrate bed to w/c transfer req CGA with L platform walker.  PT Short Term Goal 3 (Week 1): Pt will ambulate 20' with L platform walker, while maintain precautions, req CGA.  PT Short Term Goal 4 (Week 1): Pt will self propel manual w/c x 50' req min A.  PT Short Term Goal 5 (Week 1): Pt will ascend/descend 1 stair with rail req mod A.   Skilled Therapeutic Interventions/Progress Updates:   Pt received semi reclined in bed, agreeable to therapy. Pt able to recall 3/3 hip precautions. Pt transferred supine <> sit with HOB flat and no rail with min A. Pt requesting to use bathroom, stand pivot transfer without AD bed > w/c and w/c <> commode with use of grab bar and min A. W/c mobility x 100 ft using RUE and RLE, overall supervision. Pt performed stand pivot transfer using PFRW with min A w/c <> mat table. In supine on mat, patient performed LLE heel slides, ankle pumps, quad sets, and glute set to fatigue. Pt returned to room and left semi reclined in bed with all needs within reach and daughter in law present.   Therapy Documentation Precautions:  Precautions Precautions: Posterior Hip;Fall;Other (comment) Precaution Comments: L forearm casted - NWB Restrictions Weight Bearing Restrictions: Yes LUE Weight Bearing: Weight bear through elbow only LLE Weight Bearing: Touchdown weight bearing Other Position/Activity Restrictions: currently using platform walker on L UE Pain: Pain Assessment Pain Assessment: 0-10 Pain Score: 3  Pain Type: Acute pain Pain Orientation: Left Pain Descriptors / Indicators:  Constant;Dull;Discomfort Pain Onset: With Activity Patients Stated Pain Goal: 1 Pain Intervention(s): Repositioned;Rest Multiple Pain Sites: Yes 2nd Pain Site Pain Score: 3 Pain Type: Acute pain Pain Location: Wrist Pain Orientation: Left Pain Descriptors / Indicators: Nagging;Dull Pain Onset: With Activity Patient's Stated Pain Goal: 2 Pain Intervention(s): Rest;Repositioned See FIM for current functional status  Therapy/Group: Individual Therapy  Laretta Alstrom 10/21/2013, 4:56 PM

## 2013-10-21 NOTE — Progress Notes (Signed)
[facility] Admission Coordinator Signed Physical Medicine and Rehabilitation PMR Pre-admission Service date: 10/17/2013 10:37 AM  Related encounter: Documentation from 10/17/2013 in [place] PMR Admission Coordinator Pre-Admission Assessment   Patient: Jenna Ramos is an 80 y.o., female MRN: [id number] DOB: 1928/07/19 Height: 5\' 3"  (160 cm) Weight: 59.421 kg (131 lb)   Insurance Information    PRIMARY: Medicare Part A only      Policy#: [id number]      Subscriber: self Pre-Cert#: verified in [employer]: retired [employer] Date: 04-07-95        Deduct: $1260      Out of Pocket Max: none      Life Max: unlimited CIR: 100%      SNF: 100% days 1-20; 80% days 21-100 (100 day visit limit) Outpatient: No outpt benefits through Medicare Part A but covered by [place]:   Home Health: 100%      Co-Pay: none, no visit limits DME: covered by [employer]     Co-Pay:   Providers: in network  SECONDARY: [place]      Policy#: [id number]      Subscriber: self Benefits:  Phone #: [phone]        Emergency Contact Information Contact Information     Name  Relation  Home  Work  Mobile     [place]  Other      [phone]     Sarahelizabeth, [place]      [phone]         Current Medical History  Patient Admitting Diagnosis: Displaced left femoral neck fracture, left distal radius-ulna fracture, left femoral shaft fracture   History of Present Illness: Ms. Jenna Ramos is an 80 year old female with history of HTN, RA, fibromyalgia, macular degeneration who caught her toe on the edge of a stair, tripped and fell on her steps on 10/12/13 with onset of severe left hip and left wrist pain and inability to ambulate. She was taken to [facility] ED and workup revealed displaced left femoral neck fracture and distal left radius and ulna fracture. She underwent left hip  bipolar hemi and ORIF left wrist by Dr. Derald Macleod on 10/14/13. She was found to have femoral shaft fracture and underwent ORIF on 10/15/13. Post op with posterior hip precautions and is TTWB LLE.  Post op labs show hypokalemia and hyponatremia treated with IVF as well as ABLA that is being monitored.  Inpatient rehab was recommended by physical therapy.   Patient's medical record from [facility] has been reviewed by the rehabilitation admission coordinator and physician.   Past Medical History  Past Medical History   Diagnosis  Date   .  Rectal incontinence     .  Hypertension     .  Arthritis     .  Hypothyroidism     .  Chronic rheumatic arthritis     .  Anemia     .  Fibromyalgia     .  Glaucoma     .  Macular degeneration     .  HOH (hard of hearing)        Family History  family history includes Lung disease in her sister; Prostate cancer in her father.   Prior Rehab/Hospitalizations: none               Current Medications See MAR  Patients Current Diet:   Regular diet   Precautions / Restrictions Precautions Precautions: Posterior Hip;Fall (TTWB L LE and cast on L UE from L Wrist ORIF) Restrictions Weight Bearing Restrictions: Yes LLE Weight Bearing: Touchdown weight bearing Other Position/Activity Restrictions: currently using platform walker on L UE    Prior Activity Level Community (5-7x/wk): pt was very independent prior to admit. 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   Home Assistive Devices / Equipment Home Assistive Devices/Equipment: None      Prior Functional Level  Current Functional Level   Bed Mobility   Independent   Mod assist    Transfers   Independent   Max assist (using platform walker)    Mobility - Walk/Wheelchair   Independent   Mod assist (ambulating short distances: 3' and 6' intervals with platfor)    Upper Body Dressing   Independent   Other (not assessed,  anticipate ADL needs)    Lower Body Dressing   Independent   Other (not assessed, anticipate ADL needs)    Grooming   Independent   Other (not assessed, anticipate ADL needs)    Eating/Drinking   Independent   Other (currently using R hand with set up assist)    Toilet Transfer   Independent   Mod assist (to [facility])    Bladder Continence     WFL   using BSC or bedpan    Bowel Management   WFL   no reported BM     Stair Climbing   Independent   Other (not assessed at this time)    Communication   Independent   [facility]    Memory   [facility]   [facility]    Cooking/Meal Prep   Independent        Housework   Independent      Money Management   Independent      Driving   Independent         Special needs/care consideration BiPAP/CPAP no   CPM no   Continuous Drip IV no   Dialysis no          Life Vest no   Oxygen - was on 3 L O2 by nasal cannula Special Bed no   Trach Size no   Wound Vac (area) no        Skin - current L Hip incision and L forearm casted/splinted. Pt with bruise along L chin and bruise on L great toe                                Bowel mgmt: none documented per RN Bladder mgmt: currently using BSC or bedpan Diabetic mgmt no   Previous Home Environment Living Arrangements: Alone  Lives With: Alone (son and d-i-l live next door) Available Help at Discharge: Family;Friend(s) Type of Home: House Home Layout: One level Home Access: Stairs to enter [employer] of Steps: 2 steps at front door, 7 steps at back deck entrance   Discharge Living Setting Plans for Discharge Living Setting: Patient's home Type of Home at Discharge: House Discharge Home Layout: One level Discharge Home Access: Stairs to enter Entrance Stairs-Number of Steps: 2 steps at front door, 7 steps at back deck entrance Does the patient have any problems obtaining your medications?: No   Social/Family/Support Systems Patient Roles: Other (Comment) (enjoys her craft  work every Saturday at [employer] shows, gardens) Sport and exercise psychologist  Information: Daughter-in-Law [name] works at [employer] Anticipated Caregiver: friends and son/dtr-in-law Anticipated Ambulance person Information: see above Ability/Limitations of Caregiver: son/d-i-l do work but d-i-l (works at [employer]) will be able to stay with pt for 2 days/week. Pt's 3 craft friends can stay with pt during the day while son/d-i-l can be with pt in evening. Son/D-i-l live next door to pt. Caregiver Availability: 24/7 (24-7 with combo of friends/family in evenings) Discharge Plan Discussed with Primary Caregiver: Yes (discussed with Sandy on 8-11, 8-13 and 8-14) Is Caregiver In Agreement with Plan?: Yes Does Caregiver/Family have Issues with Lodging/Transportation while Pt is in Rehab?: No   Goals/Additional Needs Patient/Family Goal for Rehab: Minimal assistance and supervision with PT/OT, NA for SLP Expected length of stay: 11-13 days Cultural Considerations: none Dietary Needs: regular diet Equipment Needs: to be determined Pt/Family Agrees to Admission and willing to participate: Yes Program Orientation Provided & Reviewed with Pt/Caregiver Including Roles  & Responsibilities: Yes   Patient Condition: I reviewed pt's case with pt and her daughter-in-law from 10-14-13 daily through 10-17-13 and explained the purpose and possibility of inpatient rehabilitation. This 80 year old patient was previously very active at baseline, living alone and enjoying her craft shows every weekend prior to the fall that resulted in femoral/wrist fractures. Pt is currently requiring moderate to maximal assistance with bed mobility and transfers and ambulating limited distances using a platform walker. Her needs with self care skills and further upper extremity assessment have not yet been determined. However, based on pt's current limitations, we anticipate that pt will have a definite need for the skilled service of occupational therapy to address self  care skills and further upper extremity strength/range of motion issues. She will greatly benefit from the multi-disciplinary team of skilled PT, OT and rehab nursing to maximize her functional return following this fall. PT, OT and rehab nursing will focus on increasing pt's independence in transfers, bed mobility, gait and self-care skills. Rehab nursing will address further issues with bowel/bladder needs and medication education in preparation for home.  In addition, pt will benefit from rehab physician intervention to monitor her hypokalemia and hyponatremia. Discussed case with Dr. Letta Pate and rehab PA who stated that pt is a great rehab candidate. Pt and her daughter-in-law are motivated to come to inpatient rehab and will benefit from the intensive services of skilled therapy under rehab physician guidance. Pt will be admitted today from [facility] on 10-17-13.     Preadmission Screen Completed By:  Nanetta Batty, PT 10/17/2013 10:37 AM ______________________________________________________________________    Discussed status with Dr. Letta Pate on 10-17-13 at 1136 and received telephone approval for admission today.   Admission Coordinator:  Anne Hahn, time 1137/Date 10-17-13    Assessment/Plan: Diagnosis: Does the need for close, 24 hr/day  Medical supervision in concert with the patient's rehab needs make it unreasonable for this patient to be served in a less intensive setting? Yes Co-Morbidities requiring supervision/potential complications: Hypertension, RA, macular degeneration Due to bladder management, bowel management, safety, skin/wound care, disease management, medication administration, pain management and patient education, does the patient require 24 hr/day rehab nursing? Yes Does the patient require coordinated care of a physician, rehab nurse, PT (1-2 hrs/day, 5 days/week) and OT (1-2 hrs/day, 5 days/week) to address physical and functional  deficits in the context of the above medical diagnosis(es)? Yes Addressing deficits in the following areas: balance, endurance, locomotion, strength, transferring, bathing, dressing, feeding, grooming and toileting Can the patient actively participate in an intensive  therapy program of at least 3 hrs of therapy 5 days a week? Yes The potential for patient to make measurable gains while on inpatient rehab is good Anticipated functional outcomes upon discharge from inpatients are: modified independent and supervision PT, modified independent and supervision OT, n/a SLP Estimated rehab length of stay to reach the above functional goals is: 7-10 days Does the patient have adequate social supports to accommodate these discharge functional goals? Yes Anticipated D/C setting: Home Anticipated post D/C treatments: HH therapy Overall Rehab/Functional Prognosis: good       RECOMMENDATIONS: This patient's condition is appropriate for continued rehabilitative care in the following setting: CIR Patient has agreed to participate in recommended program. Yes Note that insurance prior authorization may be required for reimbursement for recommended care.   Comment:   Nanetta Batty, PT 10/17/2013  Revision History...     Date/Time User Action   10/17/2013 12:18 PM Charlett Blake, MD Sign   10/17/2013 11:38 AM [facility] Details Report

## 2013-10-22 ENCOUNTER — Inpatient Hospital Stay (HOSPITAL_COMMUNITY): Payer: Medicare Other

## 2013-10-22 ENCOUNTER — Encounter (HOSPITAL_COMMUNITY): Payer: Federal, State, Local not specified - PPO | Admitting: Occupational Therapy

## 2013-10-22 MED ORDER — LEVOFLOXACIN 250 MG PO TABS
250.0000 mg | ORAL_TABLET | Freq: Every day | ORAL | Status: AC
  Administered 2013-10-22 – 2013-10-26 (×5): 250 mg via ORAL
  Filled 2013-10-22 (×6): qty 1

## 2013-10-22 MED ORDER — TROLAMINE SALICYLATE 10 % EX CREA
TOPICAL_CREAM | CUTANEOUS | Status: DC | PRN
  Administered 2013-10-24: 12:00:00 via TOPICAL
  Filled 2013-10-22 (×3): qty 85

## 2013-10-22 NOTE — Plan of Care (Signed)
Problem: RH BLADDER ELIMINATION Goal: RH STG MANAGE BLADDER WITH ASSISTANCE STG Manage Bladder With min Assistance  Outcome: Not Progressing Urgency w/ discomfort

## 2013-10-22 NOTE — Progress Notes (Signed)
Physical Therapy Session Note  Patient Details  Name: TIMBERLYNN KIZZIAH MRN: [id number] Date of Birth: 1928/06/30  Today's Date: 10/22/2013 PT Individual Time: 2229-7989; 1300-1330; 2119-4174 PT Individual Time Calculation (min): 25 min , 30 min, 60 min  Short Term Goals: Week 1:  PT Short Term Goal 1 (Week 1): Pt will demonstrate supine to sit transfer req min A.  PT Short Term Goal 2 (Week 1): Pt will demonstrate bed to w/c transfer req CGA with L platform walker.  PT Short Term Goal 3 (Week 1): Pt will ambulate 20' with L platform walker, while maintain precautions, req CGA.  PT Short Term Goal 4 (Week 1): Pt will self propel manual w/c x 50' req min A.  PT Short Term Goal 5 (Week 1): Pt will ascend/descend 1 stair with rail req mod A.      Skilled Therapeutic Interventions/Progress Updates:  Tx 1:  Pt painful, tired  after B and B.  In addition, R hip painful during gait, rated 7/10.  RN informed. Pt stated she did not sleep well due to multiple episodes of urinary urgency during night.  Gait with LPFRW: sit> stand with min/mod assist; x 5' with min assist.  Distance limited by R hip pain. Pt observed hip precautions and LLE wt bearing precautions without cues. Cues for deep breathing, relaxing UEs as possible.  Returned to room where pt performed therapeutic ex in sitting: 5 x 2 active assistive L long arc quad extension.  Cues for breathing, counting out loud to exhale during painful movements.   R legrest adjusted for better alignment, reduced R hip pain.  L legrest is a little long but is at highest position.  Will look for another L legrest.  Pt left sitting up in w/c, all needs within reach.   Discussed pain with RN and MD.  Tx 2:  Bedside therapeutic exercise performed with LEs to increase strength for functional mobility: 2 x 10 bil ankle pumps;  10 x 1 AA heel slides, L quad sets, L hip abd, short arc quad knee ext, bil shoulder protraction, L shoulder and elbow  extnesion.  Pt initially had spasms in L low back whenver she used her R hand, but once repositioned more symmetrically in bed, these subsided.  Pt left in bed with bed alarm on and all needs within reach.  Tx 3:  Supine > sit with mod assist for LLE and trunk. Pt stated she needed to use toilet. Squat pivot to L bed> w/c.  W/c> toilet to L stand pivot with mod/max assist due to L back pain with movement, wt bearing as below.  W/c propulsion using RUE and RLE x 40' with supervision.  Gait with LPFRW x 10' incudling turn to L to sit, with max assist to stand, mod assist gait; when backing up 2 steps, +2 assist needed due to pt's R hip tending to collapse.  Therapeutic exercise performed with bil LE to increase strength for functional mobility: kicking beach ball forward and sideways 10 x 1 ea with LLE focusing on increasing knee flexion, R knee ext with 2# wt ankle wt 10 x 1.  Ex limited by tendency to elicit muscle spasms L lower back.  Returned to bed squat pivot to R wth mod assist.  Pt positioned for RUE edema management, cued to attain symmetrical supine posture to decrease L back spasms. Bed alarm set and all needs within reach.    Therapy Documentation Precautions:  Precautions Precautions: Posterior Hip;Fall;Other (comment)  Precaution Comments: L forearm casted - NWB Restrictions Weight Bearing Restrictions: Yes LUE Weight Bearing: Weight bear through elbow only LLE Weight Bearing: Touchdown weight bearing Other Position/Activity Restrictions: currently using platform walker on L UE   Pain: Pain Assessment Pain Assessment: 0-10 Pain Score: 7  Pain Type: Acute pain Pain Location: Back Pain Orientation: Left;Lower Pain Descriptors / Indicators: Spasm Pain Frequency: Intermittent Pain Onset: Gradual Patients Stated Pain Goal: 3 Pain Intervention(s): Medication (See eMAR) Multiple Pain Sites: Yes 2nd Pain Site Pain Score: 6 Pain Type: Surgical pain Pain Location:  Hip Pain Orientation: Left Pain Intervention(s): Medication (See eMAR);MD notified (Comment)        See FIM for current functional status  Therapy/Group: Individual Therapy  Kambri Dismore 10/22/2013, 10:37 AM

## 2013-10-22 NOTE — Patient Care Conference (Signed)
Inpatient RehabilitationTeam Conference and Plan of Care Update Date: 10/22/2013   Time: 10;45 AM    Patient Name: Jenna Ramos      Medical Record Number: [id number]  Date of Birth: January 01, 1929 Sex: Female         Room/Bed: 4W04C/4W04C-01 Payor Info: Payor: [place] / Plan: [employer] PPO / Product Type: *No Product type* /    Admitting Diagnosis: Hosp-Fem wrist Fem  ORIFS  Admit Date/Time:  10/17/2013  4:23 PM Admission Comments: No comment available   Primary Diagnosis:  <principal problem not specified> Principal Problem: <principal problem not specified>  Patient Active Problem List   Diagnosis Date Noted  . Hip fracture requiring operative repair 10/17/2013  . Anemia     Expected Discharge Date: Expected Discharge Date: 10/31/13  Team Members Present: Physician leading conference: Dr. Alysia Penna Social Worker Present: Ovidio Kin, LCSW Nurse Present: Elaina Pattee, RN PT Present: Georjean Mode, PT OT Present: Willeen Cass, Jules Schick, OT SLP Present: Windell Moulding, SLP PPS Coordinator present : Ileana Ladd, PT     Current Status/Progress Goal Weekly Team Focus  Medical   Left femur and radial fracture  maintain adequate pain control during therapy  improve activity tolerance   Bowel/Bladder   Patient is continent of bowel and bladder, has urinary urgency and frequency and discomfort, LBM 10/21/13  Patient to be free of painful voiding, remain continent of bowel and bladder  Monitor for constipation   Swallow/Nutrition/ Hydration     WFL        ADL's   min - mod A overall  supervision with bathing (planning on sponge bath unless pt can get in shower prior to discharge), mod I with dressing from bed level, mod I with toileting and toilet transfer using PFRW  ADL retraining, functional mobility training, pt education   Mobility   supervision w/c x 100'; limited by pain L hip, R hip, L lower back  modified independent basic  transfers, supervision car and gait x 25', min assist up/down 4 steps with 1 rails, modified independent w/c x 100'  bed mobility, transfers, gait training, therapeutic ex, pain management   Communication     [facility]        Safety/Cognition/ Behavioral Observations    No unsafe behaviors        Pain   Tramadol 50mg  q 6 hr prn, oxy ir 5-10 mg q 4 hr prn, patient has not requested pain medication at this time  Pain to be less than 3  Assess for pain q shift and prn    Skin   Incision to left leg- lower incision with dry gauze dressing and staples, upper incision with transparent dressing and staples; bruising to left chin, left great toe, right foot, BUE  No new skin breakdown, incisions free of infection  qshift dressing changes as ordered, ensure patient turns/has pressure relief q2hr in bed and q1hr in chair      *See Care Plan and progress notes for long and short-term goals.  Barriers to Discharge: limited weight bearing on Left side    Possible Resolutions to Barriers:  see above    Discharge Planning/Teaching Needs:  Pt and family coming up with a discharge plan for 24 hr care.      Team Discussion:  Making progress-pain limiting her in therapies.  MD doing x-rays, lower back pain using ointment for this and it helps. Endurance is getting better.  May need ramp to get up  three stairs  Revisions to Treatment Plan:  None   Continued Need for Acute Rehabilitation Level of Care: The patient requires daily medical management by a physician with specialized training in physical medicine and rehabilitation for the following conditions: Daily direction of a multidisciplinary physical rehabilitation program to ensure safe treatment while eliciting the highest outcome that is of practical value to the patient.: Yes Daily medical management of patient stability for increased activity during participation in an intensive rehabilitation regime.: Yes Daily analysis of laboratory values and/or  radiology reports with any subsequent need for medication adjustment of medical intervention for : Post surgical problems;Other  Jerome Viglione, Gardiner Rhyme 10/22/2013, 2:03 PM

## 2013-10-22 NOTE — Progress Notes (Signed)
Social Work Patient ID: Jenna Ramos, female   DOB: [birth year], 80 y.o.   MRN: [id number] Met with pt and daughter in-law to inform team conference goals-supervision/min level and target discharge 8/28.  In the process of finding assist at home for her between family members. Discussed DME and Home health follow up.  Pt's pain is limiting her and she is aware of this and RN to give pain meds prior to therapy.  Will continue to work on discharge needs.

## 2013-10-22 NOTE — Progress Notes (Signed)
Occupational Therapy Session Note  Patient Details  Name: Jenna Ramos MRN: [id number] Date of Birth: 10/16/1928  Today's Date: 10/22/2013 OT Individual Time: 0902-1008 OT Individual Time Calculation (min): 66 min   Short Term Goals: Week 1:  OT Short Term Goal 1 (Week 1): Pt. will utilized AE for LB Bathing and dressing with minimal assist. OT Short Term Goal 2 (Week 1): Pt will bath LB with min assist plus AE. OT Short Term Goal 3 (Week 1): Pt. will be minimal assist with LB dressing OT Short Term Goal 4 (Week 1): Pt. will recall hip precautions and TTWB with one questioning cue OT Short Term Goal 5 (Week 1): Pt.  will adhere to hip precautions when doing bed,to walker to toilet transfers  Skilled Therapeutic Interventions/Progress Updates:      Pt seen for BADL retraining of toileting, bathing, and dressing with a focus on functional mobility and activity tolerance. Pt continues to get SOB during the session, but it has been improving each day.  Pt is now able to complete a physical task and only rest for 1-2 minutes prior to moving on.  She is also tolerate more movement of her LLE and tolerates it being unsupported for short periods of time. Pt did not feel energetic enough to try to ambulate to bathroom, preferred w/c transfer. Discussed home discharge plans and the amount of help she will be having. She will be alone for parts of the day so will need to be mod I with toileting and transfers.  She plans to eat cold items for lunch that will not require reheating. LTGs upgraded and some LTGs discontinued based on discussion with patient. Pt's PT arrived for her next session.  Therapy Documentation Precautions:  Precautions Precautions: Posterior Hip;Fall;Other (comment) Precaution Comments: L forearm casted - NWB Restrictions Weight Bearing Restrictions: Yes LUE Weight Bearing: Weight bear through elbow only LLE Weight Bearing: Touchdown weight bearing Other Position/Activity  Restrictions: currently using platform walker on L UE   Pain: Pain Assessment Pain Assessment: 0-10 Pain Score: 6  Pain Type: Acute pain Pain Location: Back Pain Orientation: Lower;Mid Pain Descriptors / Indicators: Spasm Pain Frequency: Intermittent Pain Onset: Gradual Patients Stated Pain Goal: 3 Pain Intervention(s): RN made aware Multiple Pain Sites: No ADL:  See FIM for current functional status  Therapy/Group: Individual Therapy  [place] 10/22/2013, 10:26 AM

## 2013-10-22 NOTE — Progress Notes (Signed)
80 year old female with history of HTN, RA, fibromyalgia, macular degeneration who caught her toe on the edge of a stair, tripped and fell on her steps on 10/12/13 with onset of severe left hip and left wrist pain and inability to ambulate. She was taken to [facility] ED and workup revealed displaced left femoral neck fracture and distal left radius and ulna fracture. X ray showed question of lytic lesion and NM body scan negative for metastasis. She underwent left hip bipolar hemi and ORIF left wrist by Dr. Derald Macleod on 10/14/13. Post op xrays done revealing small fracture distal to tip of porthesis on posterior aspect of femur and she was taken to OR for ORIF left periprosthetic fracture on 10/15/13. Post op posterior hip precautions X 2 months and is TTWB LLE X 6 weeks.    Subjective/Complaints: Pain control is good, bladder urgency without dysuria yest pm  Review of Systems - urgency  Objective: Vital Signs: Blood pressure 152/51, pulse 78, temperature 98.5 F (36.9 C), temperature source Oral, resp. rate 18, height 5\' 3"  (1.6 m), weight 63.9 kg (140 lb 14 oz), SpO2 99.00%. Dg Tibia/fibula Left  10/21/2013   CLINICAL DATA:  Left leg pain.  EXAM: LEFT TIBIA AND FIBULA - 2 VIEW  COMPARISON:  None.  FINDINGS: There is no evidence of acute fracture or other focal bone lesions. Soft tissues are unremarkable.  IMPRESSION: No significant abnormality seen involving the left tibia or fibula.   Electronically Signed   By: Sabino Dick M.D.   On: 10/21/2013 11:28   No results found for this or any previous visit (from the past 72 hour(s)).   HEENT: normal Cardio: RRR Resp: CTA B/L and unlabored GI: BS positive and Non distended Extremity:  Pulses positive and No Edema Skin:   Wound C/D/I Neuro: Alert/Oriented and Abnormal Motor Left grip 3-, 2- HF, 3- KE, 4/5 ankle Musc/Skel:  Swelling Left thigh improved. Mild swelling left fingers   Gen NAD   Assessment/Plan: 1. Functional  deficits secondary to Left FNF, and radius fx which require 3+ hours per day of interdisciplinary therapy in a comprehensive inpatient rehab setting. Physiatrist is providing close team supervision and 24 hour management of active medical problems listed below. Physiatrist and rehab team continue to assess barriers to discharge/monitor patient progress toward functional and medical goals. FIM: FIM - Bathing Bathing Steps Patient Completed: Chest;Right Arm;Left Arm;Abdomen;Front perineal area;Right upper leg;Left upper leg;Buttocks;Right lower leg (including foot) Bathing: 4: Min-Patient completes 8-9 39f 10 parts or 75+ percent  FIM - Upper Body Dressing/Undressing Upper body dressing/undressing steps patient completed: Put head through opening of pull over shirt/dress;Thread/unthread left sleeve of pullover shirt/dress;Thread/unthread right sleeve of pullover shirt/dresss;Pull shirt over trunk Upper body dressing/undressing: 5: Set-up assist to: Obtain clothing/put away FIM - Lower Body Dressing/Undressing Lower body dressing/undressing steps patient completed: Thread/unthread right underwear leg;Pull underwear up/down;Don/Doff right sock Lower body dressing/undressing: 2: Max-Patient completed 25-49% of tasks  FIM - Toileting Toileting steps completed by patient: Adjust clothing prior to toileting;Performs perineal hygiene;Adjust clothing after toileting Toileting Assistive Devices: Grab bar or rail for support Toileting: 4: Steadying assist  FIM - Radio producer Devices: Grab bars;Bedside commode Toilet Transfers: 4-From toilet/BSC: Min A (steadying Pt. > 75%);4-To toilet/BSC: Min A (steadying Pt. > 75%)  FIM - Control and instrumentation engineer Devices: Walker;Arm rests Bed/Chair Transfer: 4: Supine > Sit: Min A (steadying Pt. > 75%/lift 1 leg);4: Bed > Chair or W/C: Min A (steadying Pt. >  75%);4: Sit > Supine: Min A (steadying pt. > 75%/lift 1  leg);4: Chair or W/C > Bed: Min A (steadying Pt. > 75%)  FIM - Locomotion: Wheelchair Distance: 100 Locomotion: Wheelchair: 2: Travels 50 - 149 ft with supervision, cueing or coaxing FIM - Locomotion: Ambulation Locomotion: Ambulation Assistive Devices: Engineer, agricultural Ambulation/Gait Assistance: 3: Mod assist Locomotion: Ambulation: 0: Activity did not occur  Comprehension Comprehension Mode: Auditory Comprehension: 5-Follows basic conversation/direction: With no assist  Expression Expression Mode: Verbal Expression: 5-Expresses basic needs/ideas: With no assist  Social Interaction Social Interaction: 7-Interacts appropriately with others - No medications needed.  Problem Solving Problem Solving: 5-Solves basic problems: With no assist  Memory Memory: 6-More than reasonable amt of time   Medical Problem List and Plan:  1. Functional deficits secondary to left femoral neck fracture, left femoral shaft fracture, distal left radius and ulna fracture.  2. DVT Prophylaxis/Anticoagulation: Pharmaceutical: Other (comment)--Eliquis. Check dopplers.  3. Pain Management: prn medications effective. History of fibromyalgia she thinks her doctors have tried different fibromyalgia medicines but is not sure which ones, consider gabapentin   - 4. Mood: LCSW to follow for evaluation and support.  5. Neuropsych: This patient is capable of making decisions on her own behalf.  6. Skin/Wound Care: Routine pressure relief measures. Maintain adequate nutritional status. Will add nutritional supplement.  7. HTN: Will continue to hold Norvasc. Check orthostatic BP. Monitor BP on tid basis and further adjust medications as indicated. Continue benazepril daily.  8. ABLA: Will recheck labs on Monday. Continue iron supplement.  9. Hyponatremia/Hypokalemia: Resolving. Check follow up labs in am.  10. UTI Sens to floxins and septra LOS (Days) 5 A FACE TO FACE EVALUATION WAS PERFORMED  KIRSTEINS,ANDREW  E 10/22/2013, 8:40 AM

## 2013-10-22 NOTE — Progress Notes (Signed)
Patient has complaint of discomfort when urinating. Patient also has urinary urgency and frequency.  Oval Linsey, RN

## 2013-10-23 ENCOUNTER — Inpatient Hospital Stay (HOSPITAL_COMMUNITY): Payer: Medicare Other

## 2013-10-23 ENCOUNTER — Encounter (HOSPITAL_COMMUNITY): Payer: Federal, State, Local not specified - PPO | Admitting: Occupational Therapy

## 2013-10-23 ENCOUNTER — Inpatient Hospital Stay (HOSPITAL_COMMUNITY): Payer: Federal, State, Local not specified - PPO | Admitting: Occupational Therapy

## 2013-10-23 ENCOUNTER — Inpatient Hospital Stay (HOSPITAL_COMMUNITY): Payer: Medicare Other | Admitting: *Deleted

## 2013-10-23 DIAGNOSIS — S72009A Fracture of unspecified part of neck of unspecified femur, initial encounter for closed fracture: Secondary | ICD-10-CM

## 2013-10-23 DIAGNOSIS — D649 Anemia, unspecified: Secondary | ICD-10-CM

## 2013-10-23 MED ORDER — METHOCARBAMOL 500 MG PO TABS
750.0000 mg | ORAL_TABLET | Freq: Four times a day (QID) | ORAL | Status: DC | PRN
  Administered 2013-10-23 – 2013-10-29 (×9): 750 mg via ORAL
  Filled 2013-10-23 (×10): qty 2

## 2013-10-23 NOTE — Progress Notes (Signed)
Recreational Therapy Session Note  Patient Details  Name: Jenna Ramos MRN: [id number] Date of Birth: October 28, 1928 Today's Date: 10/23/2013  Pain: no c/o Skilled Therapeutic Interventions/Progress Updates: Session focused on bed mobility, ambulation with PFRW adhering to precautions, toileting.  Pt performed bed mobility with min assist for LLE management to avoid pain.  Pt performed sit-stand, ambulation with PFRW & toilet transfer to 3n1 & toileting with min assist.  Jamarie Joplin 10/23/2013, 3:48 PM

## 2013-10-23 NOTE — Progress Notes (Signed)
80 year old female with history of HTN, RA, fibromyalgia, macular degeneration who caught her toe on the edge of a stair, tripped and fell on her steps on 10/12/13 with onset of severe left hip and left wrist pain and inability to ambulate. She was taken to [facility] ED and workup revealed displaced left femoral neck fracture and distal left radius and ulna fracture. X ray showed question of lytic lesion and NM body scan negative for metastasis. She underwent left hip bipolar hemi and ORIF left wrist by Dr. Derald Macleod on 10/14/13. Post op xrays done revealing small fracture distal to tip of porthesis on posterior aspect of femur and she was taken to OR for ORIF left periprosthetic fracture on 10/15/13. Post op posterior hip precautions X 2 months and is TTWB LLE X 6 weeks.    Subjective/Complaints: Back spasms  Review of Systems - urgency  Objective: Vital Signs: Blood pressure 121/43, pulse 69, temperature 98.8 F (37.1 C), temperature source Oral, resp. rate 18, height 5\' 3"  (1.6 m), weight 63.9 kg (140 lb 14 oz), SpO2 94.00%. Dg Tibia/fibula Left  10/21/2013   CLINICAL DATA:  Left leg pain.  EXAM: LEFT TIBIA AND FIBULA - 2 VIEW  COMPARISON:  None.  FINDINGS: There is no evidence of acute fracture or other focal bone lesions. Soft tissues are unremarkable.  IMPRESSION: No significant abnormality seen involving the left tibia or fibula.   Electronically Signed   By: Sabino Dick M.D.   On: 10/21/2013 11:28   No results found for this or any previous visit (from the past 72 hour(s)).   HEENT: normal Cardio: RRR Resp: CTA B/L and unlabored GI: BS positive and Non distended Extremity:  Pulses positive and No Edema Skin:   Wound C/D/I Neuro: Alert/Oriented and Abnormal Motor Left grip 3-, 2- HF, 3- KE, 4/5 ankle Musc/Skel:  Swelling Left thigh improved. Mild swelling left fingers Tenderness back paraspinals  Gen NAD   Assessment/Plan: 1. Functional deficits secondary to Left  FNF, and radius fx which require 3+ hours per day of interdisciplinary therapy in a comprehensive inpatient rehab setting. Physiatrist is providing close team supervision and 24 hour management of active medical problems listed below. Physiatrist and rehab team continue to assess barriers to discharge/monitor patient progress toward functional and medical goals. FIM: FIM - Bathing Bathing Steps Patient Completed: Chest;Right Arm;Left Arm;Abdomen;Front perineal area;Right upper leg;Left upper leg;Buttocks Bathing: 4: Min-Patient completes 8-9 79f 10 parts or 75+ percent  FIM - Upper Body Dressing/Undressing Upper body dressing/undressing steps patient completed: Thread/unthread right sleeve of front closure shirt/dress;Thread/unthread left sleeve of front closure shirt/dress;Button/unbutton shirt;Pull shirt around back of front closure shirt/dress Upper body dressing/undressing: 5: Set-up assist to: Obtain clothing/put away FIM - Lower Body Dressing/Undressing Lower body dressing/undressing steps patient completed: Thread/unthread right underwear leg;Pull underwear up/down;Don/Doff right sock;Thread/unthread left underwear leg Lower body dressing/undressing: 3: Mod-Patient completed 50-74% of tasks  FIM - Toileting Toileting steps completed by patient: Adjust clothing prior to toileting;Performs perineal hygiene;Adjust clothing after toileting Toileting Assistive Devices: Grab bar or rail for support Toileting: 4: Steadying assist  FIM - Radio producer Devices: Grab bars;Bedside commode Toilet Transfers: 4-From toilet/BSC: Min A (steadying Pt. > 75%);4-To toilet/BSC: Min A (steadying Pt. > 75%)  FIM - Control and instrumentation engineer Devices: Walker;Arm rests Bed/Chair Transfer: 4: Supine > Sit: Min A (steadying Pt. > 75%/lift 1 leg);4: Bed > Chair or W/C: Min A (steadying Pt. > 75%)  FIM - Locomotion: Wheelchair  Distance: 100 Locomotion:  Wheelchair: 2: Travels 44 - 149 ft with supervision, cueing or coaxing FIM - Locomotion: Ambulation Locomotion: Ambulation Assistive Devices: Engineer, agricultural Ambulation/Gait Assistance: 3: Mod assist Locomotion: Ambulation: 0: Activity did not occur  Comprehension Comprehension Mode: Auditory Comprehension: 5-Understands complex 90% of the time/Cues < 10% of the time  Expression Expression Mode: Verbal Expression: 5-Expresses complex 90% of the time/cues < 10% of the time  Social Interaction Social Interaction: 7-Interacts appropriately with others - No medications needed.  Problem Solving Problem Solving: 5-Solves complex 90% of the time/cues < 10% of the time  Memory Memory: 5-Recognizes or recalls 90% of the time/requires cueing < 10% of the time   Medical Problem List and Plan:  1. Functional deficits secondary to left femoral neck fracture, left femoral shaft fracture, distal left radius and ulna fracture.  2. DVT Prophylaxis/Anticoagulation: Pharmaceutical: Other (comment)--Eliquis.  3. Pain Management: prn medications effective. History of fibromyalgia she thinks her doctors have tried different fibromyalgia medicines but is not sure which ones, consider gabapentin , Kpad, counter irritant cream  - 4. Mood: LCSW to follow for evaluation and support.  5. Neuropsych: This patient is capable of making decisions on her own behalf.  6. Skin/Wound Care: Routine pressure relief measures. Maintain adequate nutritional status. Will add nutritional supplement.  7. HTN: Will continue to hold Norvasc. Check orthostatic BP. Monitor BP on tid basis and further adjust medications as indicated. Continue benazepril daily.  8. ABLA: Will recheck labs on Monday. Continue iron supplement.  9. Hyponatremia/Hypokalemia: Resolving. Check follow up labs in am.  10. UTI Sens to levaquin LOS (Days) 6 A FACE TO FACE EVALUATION WAS PERFORMED  KIRSTEINS,ANDREW E 10/23/2013, 8:36 AM

## 2013-10-23 NOTE — Progress Notes (Signed)
Physical Therapy Weekly Progress Note  Patient Details  Name: Jenna Ramos MRN: [id number] Date of Birth: [birth year]  Beginning of progress report period: October 17, 2013 End of progress report period: October 23, 2013  Today's Date: 10/23/2013 PT Individual Time: 1105-1205 PT Individual Time Calculation (min): 60 min   Patient has met 0 of 5 short term goals.  She is limited by R hip pain (pre-morbid due to fibromyalgia), and L lower back muscle spasms with RUE use and trunk rotation. MD  And SCMSW are aware. Pt's family is hiring someone to be with her during day; and possibly son will be spending nights with her.  Patient continues to demonstrate the following deficits: strength, balance, activity tolerance, pain,  and therefore will continue to benefit from skilled PT intervention to enhance overall performance with activity tolerance, balance, postural control, ability to compensate for deficits and functional use of  left upper extremity and left lower extremity.  Patient not progressing toward long term goals.  See goal revision..  Plan of care revisions: LTGs downgraded..  PT Short Term Goals Week 1:  PT Short Term Goal 1 (Week 1): Pt will demonstrate supine to sit transfer req min A.  PT Short Term Goal 1 - Progress (Week 1): Not met PT Short Term Goal 2 (Week 1): Pt will demonstrate bed to w/c transfer req CGA with L platform walker.  PT Short Term Goal 2 - Progress (Week 1): Not met PT Short Term Goal 3 (Week 1): Pt will ambulate 20' with L platform walker, while maintain precautions, req CGA.  PT Short Term Goal 3 - Progress (Week 1): Not met PT Short Term Goal 4 (Week 1): Pt will self propel manual w/c x 50' req min A.  PT Short Term Goal 4 - Progress (Week 1): Partly met PT Short Term Goal 5 (Week 1): Pt will ascend/descend 1 stair with rail req mod A.  PT Short Term Goal 5 - Progress (Week 1): Not met  Skilled Therapeutic Interventions/Progress Updates:   Gait  with L PFRW x 12', x 5'  min guard assist, with w/c following by another therapist.  Pt 's neck spasms increase with turning and backing up, so this is avoided for now. Dyspneic 3/4 throughout session with exertion, neck.     SBT w/c> mat level with min assist.  Pt is unable to place board or remove board due to muscle spasms in back with rotation.  Therapeutic exercise performed with LE to increase strength , flexibility and coordination for functional mobility: in sitting, L knee flexion/ext with ankle pumps at end range, alternating R and L toe tapping, in standing, bil UE supported, L hip abd.  10 x 1 each.  Therapeutic activity in sitting: L LE kicking ball 15 x 1.  Pt requested returning to be to relax back and use K pad: squat pivot to r without AD, therapist providing mod assist with lifting under L axilla, pt using bed rail.    Therapy Documentation Precautions:  Precautions Precautions: Posterior Hip;Fall;Other (comment) Precaution Comments: L forearm casted - NWB Restrictions Weight Bearing Restrictions: Yes LUE Weight Bearing: Weight bear through elbow only LLE Weight Bearing: Touchdown weight bearing Other Position/Activity Restrictions: currently using platform walker on L UE   Pain: Pain Assessment Pain Assessment: Faces Pain Score: 5  (with activity) Faces Pain Scale: No hurt Pain Type: Acute pain Pain Location: Back Pain Orientation: Left;Lower Pain Descriptors / Indicators: Aching Pain Frequency: Intermittent Pain Onset: Gradual Patients  Stated Pain Goal: 1 Pain Intervention(s): Medication (See eMAR)   Locomotion : Ambulation Ambulation/Gait Assistance: 4: Min guard     See FIM for current functional status  Therapy/Group: Individual Therapy  Samya Siciliano 10/23/2013, 12:37 PM

## 2013-10-23 NOTE — Progress Notes (Signed)
Occupational Therapy Session Note  Patient Details  Name: Jenna Ramos MRN: [id number] Date of Birth: 1928/08/08  Today's Date: 10/23/2013 OT Individual Time: 1030-1100 OT Individual Time Calculation (min): 30 min  Skilled Therapeutic Interventions/Progress Updates:    Pt performed transfer from supine to sit EOB with min assist.  She needed min assist for moving the LLE off of the EOB and then transitioning to sitting.  Pt needs mod instructional cueing throughout to keep from attempting to use the LUE to assist with sit to stand.  She was able to transfer to the bathroom for use of the 3:1 with min assist as well.  She demonstrates decreased ability to advance the LLE with steps but is able to maintain TDWBing when stepping through with the LLE.  Min assist for toilet hygiene and clothing sit to stand as well.  She ambulated to the sink and stood to use her brush and also to use alcohol gel to wash her hand.  Therapy Documentation Precautions:  Precautions Precautions: Posterior Hip;Fall;Other (comment) Precaution Comments: L forearm casted - NWB Restrictions Weight Bearing Restrictions: Yes LUE Weight Bearing: Weight bear through elbow only LLE Weight Bearing: Touchdown weight bearing Other Position/Activity Restrictions: currently using platform walker on L UE  Pain: Pain Assessment Pain Assessment: Faces Pain Score: 1  Faces Pain Scale: No hurt Pain Type: Acute pain Pain Location: Back Pain Orientation: Upper Pain Descriptors / Indicators: Aching Pain Frequency: Intermittent Pain Onset: Gradual Patients Stated Pain Goal: 1 Pain Intervention(s): Medication (See eMAR) ADL: See FIM for current functional status  Therapy/Group: Individual Therapy  Giana Castner OTR/L 10/23/2013, 12:15 PM

## 2013-10-23 NOTE — Progress Notes (Signed)
Occupational Therapy Session Note  Patient Details  Name: Jenna Ramos MRN: [id number] Date of Birth: 1929-02-25  Today's Date: 10/23/2013 OT Individual Time: 0800-0900 OT Individual Time Calculation (min): 60 min   Short Term Goals: Week 1:  OT Short Term Goal 1 (Week 1): Pt. will utilized AE for LB Bathing and dressing with minimal assist. OT Short Term Goal 2 (Week 1): Pt will bath LB with min assist plus AE. OT Short Term Goal 3 (Week 1): Pt. will be minimal assist with LB dressing OT Short Term Goal 4 (Week 1): Pt. will recall hip precautions and TTWB with one questioning cue OT Short Term Goal 5 (Week 1): Pt.  will adhere to hip precautions when doing bed,to walker to toilet transfers  Skilled Therapeutic Interventions/Progress Updates:      Pt seen for BADL retraining of toileting, bathing, and dressing with a focus on functional mobility with weight bearing and hip precautions and activity tolerance.  Pt's pain in L hip and arm are minimal, she has been having great difficulty with back spasms. The spasms prevent movement at times and limits her at times with donning R sock.  She has improved with bed mobility, in that she can now fully roll onto her R side to push up with R elbow into sitting. She completed bed to w/c transfers with PFRW.  Bathing and dressing completed at sink level with steady A as pt adjusts clothing over hips.  Pt is fully aware of WB precautions and maintains them well. Pt then ambulated to toilet with min cues to not move walker too far forward to allow her to advance R foot. Pt does become  SOB from pain and exertion, but is able to continue after short rest breaks.  Pt resting in chair with all needs met at the end of session.    Therapy Documentation Precautions:  Precautions Precautions: Posterior Hip;Fall;Other (comment) Precaution Comments: L forearm casted - NWB Restrictions Weight Bearing Restrictions: Yes LUE Weight Bearing: Weight bear  through elbow only LLE Weight Bearing: Touchdown weight bearing Other Position/Activity Restrictions: currently using platform walker on L UE Pain: Pain Assessment Pain Assessment: 0-10 Pain Score: 6  Pain Type: Acute pain Pain Location: Back Pain Orientation: Lower Pain Descriptors / Indicators: Spasm Pain Onset: On-going Pain Intervention(s): RN made aware ADL: See FIM for current functional status  Therapy/Group: Individual Therapy  [place] 10/23/2013, 9:20 AM

## 2013-10-24 ENCOUNTER — Encounter (HOSPITAL_COMMUNITY): Payer: Federal, State, Local not specified - PPO | Admitting: Occupational Therapy

## 2013-10-24 ENCOUNTER — Inpatient Hospital Stay (HOSPITAL_COMMUNITY): Payer: Federal, State, Local not specified - PPO | Admitting: *Deleted

## 2013-10-24 ENCOUNTER — Inpatient Hospital Stay (HOSPITAL_COMMUNITY): Payer: Federal, State, Local not specified - PPO | Admitting: Occupational Therapy

## 2013-10-24 NOTE — Progress Notes (Signed)
Occupational Therapy Session Note  Patient Details  Name: Jenna Ramos MRN: [id number] Date of Birth: [birth year]  Today's Date: 10/24/2013 OT Individual Time: 1300-1400 OT Individual Time Calculation (min): 60 min    Skilled Therapeutic Interventions/Progress Updates:    Pt transferred from supine to sitting EOB with min guard assist and increased time to maintain THR precautions as well as NWBing precautions for the LUE.  Had pt practice toilet transfer with RW and platform to being session.  Mod instructional cueing for sequencing steps as pt tends to not take a large enough step with the LUE.  She was able to perform with min assist level.  Once finished pt was rolled down to the ADL apartment and worked on bed mobility using the regular bed.  She was able to perform with min assist to move the LLE.  Pt finished session practicing stand pivot transfers from various surfaces in the gym and then back down in her room.  Min assist for all transfers including transfer back to bed to rest prior to next therapy.   Therapy Documentation Precautions:  Precautions Precautions: Posterior Hip;Fall;Other (comment) Precaution Comments: L forearm casted - NWB Restrictions Weight Bearing Restrictions: Yes LUE Weight Bearing: Weight bear through elbow only LLE Weight Bearing: Touchdown weight bearing Other Position/Activity Restrictions: currently using platform walker on L UE General:   Vital Signs: Therapy Vitals Temp: 99.1 F (37.3 C) Temp src: Oral Pulse Rate: 72 Resp: 17 BP: 112/39 mmHg Patient Position (if appropriate): Lying Oxygen Therapy SpO2: 99 % O2 Device: None (Room air) Pain: Pain Assessment Pain Assessment: Faces Pain Type: Acute pain Pain Location: Leg Pain Orientation: Left Pain Intervention(s): Repositioned;Emotional support;Ambulation/increased activity 2nd Pain Site Pain Type: Acute pain Pain Location: Back Pain Intervention(s): Repositioned;Emotional  support ADL: See FIM for current functional status  Therapy/Group: Individual Therapy  Rhilee Currin OTR/L 10/24/2013, 3:47 PM

## 2013-10-24 NOTE — Progress Notes (Signed)
80 year old female with history of HTN, RA, fibromyalgia, macular degeneration who caught her toe on the edge of a stair, tripped and fell on her steps on 10/12/13 with onset of severe left hip and left wrist pain and inability to ambulate. She was taken to [facility] ED and workup revealed displaced left femoral neck fracture and distal left radius and ulna fracture. X ray showed question of lytic lesion and NM body scan negative for metastasis. She underwent left hip bipolar hemi and ORIF left wrist by Dr. Derald Macleod on 10/14/13. Post op xrays done revealing small fracture distal to tip of porthesis on posterior aspect of femur and she was taken to OR for ORIF left periprosthetic fracture on 10/15/13. Post op posterior hip precautions X 2 months and is TTWB LLE X 6 weeks.    Subjective/Complaints: Back spasms improved  Review of Systems - urgency  Objective: Vital Signs: Blood pressure 134/51, pulse 74, temperature 98.4 F (36.9 C), temperature source Oral, resp. rate 17, height 5\' 3"  (1.6 m), weight 63.9 kg (140 lb 14 oz), SpO2 94.00%. No results found. No results found for this or any previous visit (from the past 72 hour(s)).   HEENT: normal Cardio: RRR Resp: CTA B/L and unlabored GI: BS positive and Non distended Extremity:  Pulses positive and No Edema Skin:   Wound C/D/I Neuro: Alert/Oriented and Abnormal Motor Left grip 3-, 2- HF, 3- KE, 4/5 ankle Musc/Skel:  Swelling Left thigh improved. Mild swelling left fingers Tenderness back paraspinals  Gen NAD   Assessment/Plan: 1. Functional deficits secondary to Left FNF, and radius fx which require 3+ hours per day of interdisciplinary therapy in a comprehensive inpatient rehab setting. Physiatrist is providing close team supervision and 24 hour management of active medical problems listed below. Physiatrist and rehab team continue to assess barriers to discharge/monitor patient progress toward functional and medical  goals. FIM: FIM - Bathing Bathing Steps Patient Completed: Chest;Right Arm;Left Arm;Abdomen;Front perineal area;Right upper leg;Left upper leg;Buttocks;Right lower leg (including foot) Bathing: 4: Min-Patient completes 8-9 67f 10 parts or 75+ percent  FIM - Upper Body Dressing/Undressing Upper body dressing/undressing steps patient completed: Thread/unthread right sleeve of front closure shirt/dress;Thread/unthread left sleeve of front closure shirt/dress;Button/unbutton shirt;Pull shirt around back of front closure shirt/dress Upper body dressing/undressing: 5: Set-up assist to: Obtain clothing/put away FIM - Lower Body Dressing/Undressing Lower body dressing/undressing steps patient completed: Thread/unthread right underwear leg;Pull underwear up/down;Don/Doff right sock;Thread/unthread left underwear leg Lower body dressing/undressing: 3: Mod-Patient completed 50-74% of tasks  FIM - Toileting Toileting steps completed by patient: Adjust clothing prior to toileting;Performs perineal hygiene;Adjust clothing after toileting Toileting Assistive Devices: Grab bar or rail for support Toileting: 4: Steadying assist  FIM - Radio producer Devices: Grab bars;Bedside commode;Walker (walker into bathroom, w/c out) Toilet Transfers: 4-From toilet/BSC: Min A (steadying Pt. > 75%);4-To toilet/BSC: Min A (steadying Pt. > 75%)  FIM - Control and instrumentation engineer Devices: Walker;Arm rests Bed/Chair Transfer: 3: Sit > Supine: Mod A (lifting assist/Pt. 50-74%/lift 2 legs);3: Chair or W/C > Bed: Mod A (lift or lower assist)  FIM - Locomotion: Wheelchair Distance: 100 Locomotion: Wheelchair: 1: Travels less than 50 ft with minimal assistance (Pt.>75%) FIM - Locomotion: Ambulation Locomotion: Ambulation Assistive Devices: Engineer, agricultural Ambulation/Gait Assistance: 4: Min guard Locomotion: Ambulation: 1: Two helpers (2nd helper to follow with  w/c)  Comprehension Comprehension Mode: Auditory Comprehension: 5-Understands basic 90% of the time/requires cueing < 10% of the time  Expression  Expression Mode: Verbal Expression: 5-Expresses complex 90% of the time/cues < 10% of the time  Social Interaction Social Interaction: 7-Interacts appropriately with others - No medications needed.  Problem Solving Problem Solving: 5-Solves complex 90% of the time/cues < 10% of the time  Memory Memory: 5-Recognizes or recalls 90% of the time/requires cueing < 10% of the time   Medical Problem List and Plan:  1. Functional deficits secondary to left femoral neck fracture, left femoral shaft fracture, distal left radius and ulna fracture.  2. DVT Prophylaxis/Anticoagulation: Pharmaceutical: Other (comment)--Eliquis.  3. Pain Management: prn medications effective. History of fibromyalgia she thinks her doctors have tried different fibromyalgia medicines but is not sure which ones, consider gabapentin , Kpad, counter irritant cream  - 4. Mood: LCSW to follow for evaluation and support.  5. Neuropsych: This patient is capable of making decisions on her own behalf.  6. Skin/Wound Care: Routine pressure relief measures. Maintain adequate nutritional status. Will add nutritional supplement.  7. HTN: Will continue to hold Norvasc. Check orthostatic BP. Monitor BP on tid basis and further adjust medications as indicated. Continue benazepril daily.  8. ABLA: Will recheck labs on Monday. Continue iron supplement.  9. Hyponatremia/Hypokalemia: Resolving. Check follow up labs in am.  10. UTI Sens to levaquin LOS (Days) 7 A FACE TO FACE EVALUATION WAS PERFORMED  KIRSTEINS,ANDREW E 10/24/2013, 8:38 AM

## 2013-10-24 NOTE — Progress Notes (Signed)
Physical Therapy Session Note  Patient Details  Name: Jenna Ramos MRN: [id number] Date of Birth: 16-Apr-1928  Today's Date: 10/24/2013 PT Individual Time: 15:00-16:00 (78min)    Skilled Therapeutic Interventions/Progress Updates:  Tx focused on therex for strengthening, functional mobility, and gait training with PFRW. Pt reports less pain and spasms today, feeling positive about her progress. Family/friends present to discuss DC plants, which include WC bump into house   Pt resting in bed, performed supine therex including ankle pumps, heel slides, SAQ, glute and quad sets, hip ABD, and hip ADD squeeze x10 with cues for technique. Seated LAQ x10 with 5 sec holds; Standing marching, hip ABD, and HS curls x10 each at [place] with cues for technique.    Gait 1x15' with PFRW and min-guard, but too fatigued by end to safely perform additional walk.  WC propulsion x20' with min A for steering, pt limited by fatigue with hemi-technique.   Supine>sit with S, but min A needed for sit>supine for LLE lifting.  Transfers x4 with PFRW and min-guard A WC<>toilet and >bed. Cues for efficiency only, increased time required.   Pt able to recall 2/3 hip precautions, needing reminder about hip flexion, which she functionally adheres to as well as TTWB. Pt left up in bed with all needs in reach, K-pad on and bed alarm on.      Therapy Documentation Precautions:  Precautions Precautions: Posterior Hip;Fall;Other (comment) Precaution Comments: L forearm casted - NWB Restrictions Weight Bearing Restrictions: Yes LUE Weight Bearing: Weight bear through elbow only LLE Weight Bearing: Touchdown weight bearing Other Position/Activity Restrictions: currently using platform walker on L UE    Vital Signs: Therapy Vitals Temp: 99.1 F (37.3 C) Temp src: Oral Pulse Rate: 72 Resp: 17 BP: 112/39 mmHg Patient Position (if appropriate): Lying Oxygen Therapy SpO2: 99 % O2 Device: None (Room air) Pain:   5/10 back pain, modified tx and provided rest breaks. K-pad applied after tx.       Locomotion : Ambulation Ambulation/Gait Assistance: 4: Min Building control surveyor Distance: 20   See FIM for current functional status  Therapy/Group: Individual Therapy Kennieth Rad, PT, DPT   10/24/2013, 3:40 PM

## 2013-10-24 NOTE — Progress Notes (Signed)
Occupational Therapy Weekly Progress Note  Patient Details  Name: Jenna Ramos MRN: [id number] Date of Birth: 1928-06-21  Beginning of progress report period: October 18, 2013 End of progress report period: October 24, 2013  Today's Date: 10/24/2013 OT Individual Time: 0902-1002 OT Individual Time Calculation (min): 60 min   Patient has met 5 of 5 short term goals.  Pt is making gradual progress despite her chronic back pain. Pt is doing well with following hip and WB precautions.   Patient continues to demonstrate the following deficits: decreased standing balance during LB self care, back pain that prohibits some movement with ADLS, decreased activity tolerance and therefore will continue to benefit from skilled OT intervention to enhance overall performance with BADL.  Patient progressing toward long term goals..  Continue plan of care.  Pt's initial LTGs were supervision overall. 3 days ago, toileting and dressing were upgraded to mod I as pt was certain she would have to be alone during parts of the day. Due to her chronic back pain and muscle spasms and slow progress with mobility with PT, pt's family will provide 24/7 supervision for the next 2 months when pt needs to adhere to hip precautions and WB precautions.  Because pt is having difficulty advancing past min A this week, her LTGs were changed back to the original goals of supervision/ min A as pt will be going home in 1 week.   OT Short Term Goals Week 1:  OT Short Term Goal 1 (Week 1): Pt. will utilized AE for LB Bathing and dressing with minimal assist. OT Short Term Goal 1 - Progress (Week 1): Met OT Short Term Goal 2 (Week 1): Pt will bath LB with min assist plus AE. OT Short Term Goal 2 - Progress (Week 1): Met OT Short Term Goal 3 (Week 1): Pt. will be minimal assist with LB dressing OT Short Term Goal 3 - Progress (Week 1): Met OT Short Term Goal 4 (Week 1): Pt. will recall hip precautions and TTWB with one  questioning cue OT Short Term Goal 4 - Progress (Week 1): Met OT Short Term Goal 5 (Week 1): Pt.  will adhere to hip precautions when doing bed,to walker to toilet transfers OT Short Term Goal 5 - Progress (Week 1): Met Week 2:  OT Short Term Goal 1 (Week 2): STGs = LTGs as pt's discharge date 10/31/13.  Skilled Therapeutic Interventions/Progress Updates:      Pt seen for BADL retraining of toileting, bathing, and dressing with a focus on functional mobility and activity tolerance. Pt needed min A with sidelying to sit for LLE support. She then stood with PFRW (pt prefers to place her L arm on platform first) and transferred to w/c with steady A. She needed to use toilet urgently, so used w/c to enter bathroom.  Pt did well completing self care with fewer rest breaks and tolerated standing for longer periods of time during LB self care.  Pt completed self care and used PFRW to transfer to recliner.  Call light and phone in reach.  Therapy Documentation Precautions:  Precautions Precautions: Posterior Hip;Fall;Other (comment) Precaution Comments: L forearm casted - NWB Restrictions Weight Bearing Restrictions: Yes LUE Weight Bearing: Weight bear through elbow only LLE Weight Bearing: Touchdown weight bearing Other Position/Activity Restrictions: currently using platform walker on L UE   Pain: Pain Assessment Pain Assessment: 0-10 Pain Score: 6  Pain Location: Back Pain Orientation: Left;Right;Mid;Lower Pain Descriptors / Indicators: Cramping;Aching Patients Stated Pain Goal: 6  Pain Intervention(s): Medication (See eMAR);Heat applied  ADL:  See FIM for current functional status  Therapy/Group: Individual Therapy  Jenna Ramos 10/24/2013, 11:45 AM

## 2013-10-25 ENCOUNTER — Inpatient Hospital Stay (HOSPITAL_COMMUNITY): Payer: Federal, State, Local not specified - PPO | Admitting: *Deleted

## 2013-10-25 NOTE — Progress Notes (Signed)
80 year old female with history of HTN, RA, fibromyalgia, macular degeneration who caught her toe on the edge of a stair, tripped and fell on her steps on 10/12/13 with onset of severe left hip and left wrist pain and inability to ambulate. She was taken to [facility] ED and workup revealed displaced left femoral neck fracture and distal left radius and ulna fracture. X ray showed question of lytic lesion and NM body scan negative for metastasis. She underwent left hip bipolar hemi and ORIF left wrist by Dr. Derald Macleod on 10/14/13. Post op xrays done revealing small fracture distal to tip of porthesis on posterior aspect of femur and she was taken to OR for ORIF left periprosthetic fracture on 10/15/13. Post op posterior hip precautions X 2 months and is TTWB LLE X 6 weeks.    Subjective/Complaints: No back spasms Received HEP  Review of Systems - urgency  Objective: Vital Signs: Blood pressure 143/50, pulse 72, temperature 98.9 F (37.2 C), temperature source Oral, resp. rate 16, height 5\' 3"  (1.6 m), weight 63.9 kg (140 lb 14 oz), SpO2 97.00%. No results found. No results found for this or any previous visit (from the past 72 hour(s)).   HEENT: normal Cardio: RRR Resp: CTA B/L and unlabored GI: BS positive and Non distended Extremity:  Pulses positive and No Edema Skin:   Wound C/D/I Neuro: Alert/Oriented and Abnormal Motor Left grip 3-, 2- HF, 3- KE, 4/5 ankle Musc/Skel:  Swelling Left thigh improved. Mild swelling left fingers Tenderness back paraspinals  Gen NAD   Assessment/Plan: 1. Functional deficits secondary to Left FNF, and radius fx which require 3+ hours per day of interdisciplinary therapy in a comprehensive inpatient rehab setting. Physiatrist is providing close team supervision and 24 hour management of active medical problems listed below. Physiatrist and rehab team continue to assess barriers to discharge/monitor patient progress toward functional and  medical goals. FIM: FIM - Bathing Bathing Steps Patient Completed: Chest;Right Arm;Left Arm;Abdomen;Front perineal area;Right upper leg;Left upper leg;Buttocks;Right lower leg (including foot) Bathing: 4: Min-Patient completes 8-9 34f 10 parts or 75+ percent  FIM - Upper Body Dressing/Undressing Upper body dressing/undressing steps patient completed: Thread/unthread right sleeve of front closure shirt/dress;Thread/unthread left sleeve of front closure shirt/dress;Button/unbutton shirt;Pull shirt around back of front closure shirt/dress Upper body dressing/undressing: 5: Set-up assist to: Obtain clothing/put away FIM - Lower Body Dressing/Undressing Lower body dressing/undressing steps patient completed: Thread/unthread right underwear leg;Thread/unthread left underwear leg;Pull underwear up/down;Thread/unthread right pants leg;Thread/unthread left pants leg;Pull pants up/down;Don/Doff right sock Lower body dressing/undressing: 3: Mod-Patient completed 50-74% of tasks  FIM - Toileting Toileting steps completed by patient: Adjust clothing prior to toileting;Performs perineal hygiene;Adjust clothing after toileting Toileting Assistive Devices: Grab bar or rail for support Toileting: 4: Steadying assist  FIM - Radio producer Devices: Grab bars;Bedside commode;Walker (walker into bathroom, w/c out) Toilet Transfers: 4-From toilet/BSC: Min A (steadying Pt. > 75%);4-To toilet/BSC: Min A (steadying Pt. > 75%)  FIM - Control and instrumentation engineer Devices: Walker;Arm rests Bed/Chair Transfer: 5: Supine > Sit: Supervision (verbal cues/safety issues);4: Bed > Chair or W/C: Min A (steadying Pt. > 75%);4: Sit > Supine: Min A (steadying pt. > 75%/lift 1 leg)  FIM - Locomotion: Wheelchair Distance: 20 Locomotion: Wheelchair: 1: Travels less than 50 ft with minimal assistance (Pt.>75%) FIM - Locomotion: Ambulation Locomotion: Ambulation Assistive Devices:  Engineer, agricultural Ambulation/Gait Assistance: 4: Min guard Locomotion: Ambulation: 1: Travels less than 50 ft with minimal assistance (Pt.>75%)  Comprehension Comprehension Mode: Auditory Comprehension: 5-Understands basic 90% of the time/requires cueing < 10% of the time  Expression Expression Mode: Verbal Expression: 5-Expresses complex 90% of the time/cues < 10% of the time  Social Interaction Social Interaction: 7-Interacts appropriately with others - No medications needed.  Problem Solving Problem Solving: 5-Solves complex 90% of the time/cues < 10% of the time  Memory Memory: 5-Recognizes or recalls 90% of the time/requires cueing < 10% of the time   Medical Problem List and Plan:  1. Functional deficits secondary to left femoral neck fracture, left femoral shaft fracture, distal left radius and ulna fracture.  2. DVT Prophylaxis/Anticoagulation: Pharmaceutical: Other (comment)--Eliquis.  3. Pain Management: prn medications effective. History of fibromyalgia she thinks her doctors have tried different fibromyalgia medicines but is not sure which ones, consider gabapentin , Kpad, counter irritant cream  - 4. Mood: LCSW to follow for evaluation and support.  5. Neuropsych: This patient is capable of making decisions on her own behalf.  6. Skin/Wound Care: Routine pressure relief measures. Maintain adequate nutritional status. Will add nutritional supplement.  7. HTN: Will continue to hold Norvasc. Check orthostatic BP. Monitor BP on tid basis and further adjust medications as indicated. Continue benazepril daily.  8. ABLA: Will recheck labs on Monday. Continue iron supplement.  9. Hyponatremia/Hypokalemia: Resolved 10. UTI Sens to levaquin LOS (Days) 8 A FACE TO FACE EVALUATION WAS PERFORMED  Christifer Chapdelaine E 10/25/2013, 9:32 AM

## 2013-10-25 NOTE — Progress Notes (Signed)
Physical Therapy Session Note  Patient Details  Name: Jenna Ramos MRN: [id number] Date of Birth: 09-14-1928  Today's Date: 10/25/2013 PT Individual Time:    9:00-10:00 (55min)      Skilled Therapeutic Interventions/Progress Updates:  Tx focused on functional mobility training, WC propulsion, gait training, and therex for increased strength and ROM. Pt interested in HEP handouts to work over the weekend. Trialed Ulice Dash basic back with good comfort and relaxation of spasms.   Pt propelled WC x30' with S and cues for eficiency. Pt limited by fatigue and tall height of WC, but no smaller appropriate fit available. Discussed home access and WC use if too fatigued to walk.   Sit<>stand with PFRW 69min  with S and gait with CGA x20' with cues for turning.   Instructed pt in HEP in sitting and supine x10 each for strengthening and ROM. Placed hot pack and manual therapy to R shoulder for pain relief.   Sit>supine with min A, supine>sit with S and increased time required. Reviewed precautions and goals for therapy. Pt with no further questions at this time.       Therapy Documentation Precautions:  Precautions Precautions: Posterior Hip;Fall;Other (comment) Precaution Comments: L forearm casted - NWB Restrictions Weight Bearing Restrictions: Yes LUE Weight Bearing: Weight bear through elbow only LLE Weight Bearing: Touchdown weight bearing Other Position/Activity Restrictions: currently using platform walker on L UE General:   Vital Signs: Therapy Vitals Temp: 98.9 F (37.2 C) Temp src: Oral Pulse Rate: 72 Resp: 16 BP: 143/50 mmHg Patient Position (if appropriate): Lying Pain: 4/10 discomfort at back and hip, modified tx and provided hot pack and manual therapy.       Locomotion : Ambulation Ambulation/Gait Assistance: 4: Min Building control surveyor Distance: 30   See FIM for current functional status  Therapy/Group: Individual Therapy Kennieth Rad, PT,  DPT  10/25/2013, 9:43 AM

## 2013-10-26 ENCOUNTER — Inpatient Hospital Stay (HOSPITAL_COMMUNITY): Payer: Federal, State, Local not specified - PPO | Admitting: *Deleted

## 2013-10-26 MED ORDER — GABAPENTIN 100 MG PO CAPS
100.0000 mg | ORAL_CAPSULE | Freq: Three times a day (TID) | ORAL | Status: DC
  Administered 2013-10-26 – 2013-10-27 (×5): 100 mg via ORAL
  Filled 2013-10-26 (×9): qty 1

## 2013-10-26 NOTE — Progress Notes (Signed)
80 year old female with history of HTN, RA, fibromyalgia, macular degeneration who caught her toe on the edge of a stair, tripped and fell on her steps on 10/12/13 with onset of severe left hip and left wrist pain and inability to ambulate. She was taken to [facility] ED and workup revealed displaced left femoral neck fracture and distal left radius and ulna fracture. X ray showed question of lytic lesion and NM body scan negative for metastasis. She underwent left hip bipolar hemi and ORIF left wrist by Dr. Derald Macleod on 10/14/13. Post op xrays done revealing small fracture distal to tip of porthesis on posterior aspect of femur and she was taken to OR for ORIF left periprosthetic fracture on 10/15/13. Post op posterior hip precautions X 2 months and is TTWB LLE X 6 weeks.    Subjective/Complaints: Neck is soere.  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 rate 18, height 5\' 3"  (1.6 m), weight 63.9 kg (140 lb 14 oz), SpO2 95.00%. No results found. No results found for this or any previous visit (from the past 72 hour(s)).   HEENT: normal Cardio: RRR Resp: CTA B/L and unlabored GI: BS positive and Non distended Extremity:  Pulses positive and No Edema Skin:   Wound C/D/I Neuro: Alert/Oriented and Abnormal Motor Left grip 3-, 2- HF, 3- KE, 4/5 ankle, RUE 5/5 Musc/Skel:  Swelling Left thigh improved. Mild swelling left fingers Sterno costal tenderness  Gen NAD   Assessment/Plan: 1. Functional deficits secondary to Left FNF, and radius fx which require 3+ hours per day of interdisciplinary therapy in a comprehensive inpatient rehab setting. Physiatrist is providing close team supervision and 24 hour management of active medical problems listed below. Physiatrist and rehab team continue to assess barriers to  discharge/monitor patient progress toward functional and medical goals. FIM: FIM - Bathing Bathing Steps Patient Completed: Chest;Right Arm;Left Arm;Abdomen;Front perineal area;Right upper leg;Left upper leg;Buttocks;Right lower leg (including foot) Bathing: 4: Min-Patient completes 8-9 64f 10 parts or 75+ percent  FIM - Upper Body Dressing/Undressing Upper body dressing/undressing steps patient completed: Thread/unthread right sleeve of front closure shirt/dress;Thread/unthread left sleeve of front closure shirt/dress;Button/unbutton shirt;Pull shirt around back of front closure shirt/dress Upper body dressing/undressing: 5: Set-up assist to: Obtain clothing/put away FIM - Lower Body Dressing/Undressing Lower body dressing/undressing steps patient completed: Thread/unthread right underwear leg;Thread/unthread left underwear leg;Pull underwear up/down;Thread/unthread right pants leg;Thread/unthread left pants leg;Pull pants up/down;Don/Doff right sock Lower body dressing/undressing: 3: Mod-Patient completed 50-74% of tasks  FIM - Toileting Toileting steps completed by patient: Adjust clothing prior to toileting;Performs perineal hygiene;Adjust clothing after toileting Toileting Assistive Devices: Grab bar or rail for support Toileting: 4: Steadying assist  FIM - Radio producer Devices: Grab bars;Bedside commode;Walker (walker into bathroom, w/c out) Toilet Transfers: 4-From toilet/BSC: Min A (steadying Pt. > 75%);4-To toilet/BSC: Min A (steadying Pt. > 75%)  FIM - Control and instrumentation engineer Devices: Walker;Arm rests Bed/Chair Transfer: 4: Sit > Supine: Min A (steadying pt. > 75%/lift 1 leg);4: Bed > Chair or W/C: Min A (steadying Pt. > 75%)  FIM - Locomotion: Wheelchair Distance: 30 Locomotion: Wheelchair: 1: Travels less than 50 ft with supervision, cueing or coaxing FIM - Locomotion: Ambulation Locomotion: Ambulation Assistive Devices:  Engineer, agricultural Ambulation/Gait Assistance: 4: Min guard Locomotion: Ambulation: 1: Travels less than 50  ft with minimal assistance (Pt.>75%)  Comprehension Comprehension Mode: Auditory Comprehension: 5-Understands basic 90% of the time/requires cueing < 10% of the time  Expression Expression Mode: Verbal Expression: 5-Expresses complex 90% of the time/cues < 10% of the time  Social Interaction Social Interaction: 7-Interacts appropriately with others - No medications needed.  Problem Solving Problem Solving: 5-Solves complex 90% of the time/cues < 10% of the time  Memory Memory: 5-Recognizes or recalls 90% of the time/requires cueing < 10% of the time   Medical Problem List and Plan:  1. Functional deficits secondary to left femoral neck fracture, left femoral shaft fracture, distal left radius and ulna fracture.  2. DVT Prophylaxis/Anticoagulation: Pharmaceutical: Other (comment)--Eliquis.  3. Pain Management: prn medications effective. History of fibromyalgia she thinks her doctors have tried different fibromyalgia medicines but is not sure which ones, consider gabapentin , Kpad, counter irritant cream  - 4. Mood: LCSW to follow for evaluation and support.  5. Neuropsych: This patient is capable of making decisions on her own behalf.  6. Skin/Wound Care: Routine pressure relief measures. Maintain adequate nutritional status. Will add nutritional supplement.  7. HTN: Will continue to hold Norvasc. Check orthostatic BP. Monitor BP on tid basis and further adjust medications as indicated. Continue benazepril daily.  8. ABLA: Will recheck labs on Monday. Continue iron supplement.  9. Hyponatremia/Hypokalemia: Resolved 10. UTI Sens to levaquin 11.  Neck pain probable spondylosis- cont topical cream, Kpad, analgesics and PT LOS (Days) 9 A FACE TO FACE EVALUATION WAS PERFORMED  Jenna Ramos E 10/26/2013, 8:50 AM

## 2013-10-26 NOTE — Progress Notes (Signed)
Physical Therapy Note  Patient Details  Name: Jenna Ramos MRN: [id number] Date of Birth: 05-24-1928 Today's Date: 10/26/2013    Patient received supine in bed, declining to participate in therapy. Patient reports she "had the worst night" and "woke up not being able to move my R side." Attempted to encourage patient in bed level exercises, but she states "I think it's my arthritis and I just need to rest." Will follow up as able.   Willard Marquis Diles, PT, DPT 10/26/2013, 4:08 PM

## 2013-10-27 ENCOUNTER — Inpatient Hospital Stay (HOSPITAL_COMMUNITY): Payer: Federal, State, Local not specified - PPO | Admitting: Rehabilitation

## 2013-10-27 ENCOUNTER — Encounter (HOSPITAL_COMMUNITY): Payer: Medicare Other | Admitting: Occupational Therapy

## 2013-10-27 ENCOUNTER — Inpatient Hospital Stay (HOSPITAL_COMMUNITY): Payer: Medicare Other

## 2013-10-27 DIAGNOSIS — S72009A Fracture of unspecified part of neck of unspecified femur, initial encounter for closed fracture: Secondary | ICD-10-CM

## 2013-10-27 DIAGNOSIS — D649 Anemia, unspecified: Secondary | ICD-10-CM

## 2013-10-27 MED ORDER — MUSCLE RUB 10-15 % EX CREA
TOPICAL_CREAM | CUTANEOUS | Status: DC | PRN
  Administered 2013-10-27: 1 via TOPICAL
  Administered 2013-10-28 – 2013-10-29 (×3): via TOPICAL
  Filled 2013-10-27 (×2): qty 85

## 2013-10-27 NOTE — Progress Notes (Signed)
Occupational Therapy Session Note  Patient Details  Name: Jenna Ramos MRN: [id number] Date of Birth: [birth year]  Today's Date: 10/27/2013 OT Individual Time: 3295-1884 OT Individual Time Calculation (min): 45 min   Short Term Goals: Week 1:  OT Short Term Goal 1 (Week 1): Pt. will utilized AE for LB Bathing and dressing with minimal assist. OT Short Term Goal 1 - Progress (Week 1): Met OT Short Term Goal 2 (Week 1): Pt will bath LB with min assist plus AE. OT Short Term Goal 2 - Progress (Week 1): Met OT Short Term Goal 3 (Week 1): Pt. will be minimal assist with LB dressing OT Short Term Goal 3 - Progress (Week 1): Met OT Short Term Goal 4 (Week 1): Pt. will recall hip precautions and TTWB with one questioning cue OT Short Term Goal 4 - Progress (Week 1): Met OT Short Term Goal 5 (Week 1): Pt.  will adhere to hip precautions when doing bed,to walker to toilet transfers OT Short Term Goal 5 - Progress (Week 1): Met Week 2:  OT Short Term Goal 1 (Week 2): STGs = LTGs as pt's discharge date 10/31/13.  Skilled Therapeutic Interventions/Progress Updates:    Pt seen for ADL retraining of bathing and dressing with a focus on pain tolerance, activity tolerance, and functional mobility. Pt stated she was having an exacerbation of her fibromyalgia with severe neck pain. Despite the pain, pt only needed a minor amount of increased A. She required steady A in standing and min A to pull pants over hips.  Pt stated she was not sure that she would have A every night at home. Pt will need 24/7 A until she is able to weight bear on her LLE.  Pt taken to the gym in her w/c for her next PT session.   Therapy Documentation Precautions:  Precautions Precautions: Posterior Hip;Fall;Other (comment) Precaution Comments: L forearm casted - NWB Restrictions Weight Bearing Restrictions: Yes LUE Weight Bearing: Weight bear through elbow only LLE Weight Bearing: Touchdown weight bearing Other  Position/Activity Restrictions: currently using platform walker on L UE Pain: Pain Assessment Pain Assessment: 0-10 Pain Score: 6  Pain Type: Acute pain Pain Location: Neck Pain Orientation: Right Pain Descriptors / Indicators: Aching Pain Onset: On-going Pain Intervention(s):  (pt pre-medicated before therapy) ADL:  See FIM for current functional status  Therapy/Group: Individual Therapy  [place] 10/27/2013, 10:31 AM

## 2013-10-27 NOTE — Progress Notes (Addendum)
Physical Therapy Session Note  Patient Details  Name: Jenna Ramos MRN: [id number] Date of Birth: [birth year]  Today's Date: 10/27/2013 PT Individual Time: 1020-1105; 1305-1405 PT Individual Time Calculation (min): 45 min and 60 min  Short Term Goals: Week 2:  PT Short Term Goal 1 (Week 2): = LTGS due to LOS  LTG distance for gait decreased due to pt's pain increasing with distance; w/c self propulsion LTGs may not be feasible due to fibromyalgia flare up.  Skilled Therapeutic Interventions/Progress Updates:    Tx 1: pain  R shoulder/neck 6/10, premedicated   Pt dyspneic 3/10 at rest; she stated this is because deep breathing causes increased pain in rib area due to fibromyalgia. Discussed at length situation at home after d/c.  Pt's L hip pain and LLE function has improved steadily, but OA/fibromyalgia pain in L lower back, R hip and now R shoulder /neck have flared up.  Discussed home situation, floor types, poor activity tolerance.    Squat pivot transfer to R w/c> bed with mat on top to increase firmness, min assist under pt's L axilla.  Sit> supine with supervision.  Therapeutic exercise performed with LE to increase strength for functional mobility: in supine- 10 x 1 L straight leg raise, 7 x 1 L hip abd, 10 x 1 L heel slide, 10 x 1 alternating L and R ankle pumps.  Supine> sit with min assist under pt's head as she requested, otherwise supervision. Squat pivot to L as above. Pt returned to room and all needs left in place.  Discussed pt's limitations due to fibromyalgia with CMSW and OT. Pt will not be mobile in a w/c at home due to difficulties using RUE to propel; in a hemi height w/c she will have more difficulty sit> stand.  Tx 2: pain 3/10 R shoulder/neck at rest; increases with any movement; treated with moist heat pack in sitting during therapeutic exercise and rest breaks.   From flat hospital bed, supine> sit with min assist to elevate trunk. Bed> PFRW with  supervision, extra time.    Gait in room on level tile x 12' bed > w/c using LPFRW, including turn and sit to L.  VCs for best set-up of turn to limit number of steps she must take backwards before sitting.  Therapeutic exercise performed with LE to increase strength for functional mobility: in sitting, bil calf raises, R long arc quad knee ext with 2# ankle wt, L long arc knee ext without wt, alternating L and R ankle DF. 10 x 2 each, bil hip add against light green Theraband 10 x 1.    Gait x 12' as above.  Stand> sit speed and safety limited by neck pain; pt unable to reach back with R hand for support surface.  Sit> stand with mod assist due to neck pain; stand pivot with PFRW to w/c.  Pt returned to room and all needs left in place.  Therapy Documentation Precautions:  Precautions Precautions: Posterior Hip;Fall;Other (comment) Precaution Comments: L forearm casted - NWB Restrictions Weight Bearing Restrictions: Yes LUE Weight Bearing: Weight bear through elbow only LLE Weight Bearing: Touchdown weight bearing Other Position/Activity Restrictions: currently using platform walker on L UE      See FIM for current functional status  Therapy/Group: Individual Therapy  Rekha Hobbins 10/27/2013, 1:41 PM

## 2013-10-27 NOTE — Progress Notes (Signed)
Social Work Patient ID: Jenna Ramos, female   DOB: 1928-10-27, 80 y.o.   MRN: [id number] Met with pt and spoke with daughter in-law-[name] to express concerns regarding pt's care and the need for 24 hr hands on care.  [name] to come tomorrow at 9;00-9;45 and Wed for PT. She feels she can only leave her job an hour at a time.  Discussed having pt's son come in also if he will be assisting pt at home.  Will discuss alternatives with tomorrow.  See how family education goes. Pt is aware of the team's concerns.

## 2013-10-27 NOTE — Progress Notes (Signed)
80 year old female with history of HTN, RA, fibromyalgia, macular degeneration who caught her toe on the edge of a stair, tripped and fell on her steps on 10/12/13 with onset of severe left hip and left wrist pain and inability to ambulate. She was taken to [facility] ED and workup revealed displaced left femoral neck fracture and distal left radius and ulna fracture. X ray showed question of lytic lesion and NM body scan negative for metastasis. She underwent left hip bipolar hemi and ORIF left wrist by Dr. Derald Macleod on 10/14/13. Post op xrays done revealing small fracture distal to tip of prosthesis on posterior aspect of femur and she was taken to OR for ORIF left periprosthetic fracture on 10/15/13. Post op posterior hip precautions X 2 months and is TTWB LLE X 6 weeks.    Subjective/Complaints: Pt still with neck pain, no wrist pain No other c/os no shooting pain in arms or numbness  Review of Systems - urgency  Objective: Vital Signs: Blood pressure 124/56, pulse 74, temperature 98.5 F (36.9 C), temperature source Oral, resp. rate 18, height 5\' 3"  (1.6 m), weight 63.504 kg (140 lb), SpO2 96.00%. No results found. No results found for this or any previous visit (from the past 72 hour(s)).   HEENT: normal Cardio: RRR Resp: CTA B/L and unlabored GI: BS positive and Non distended Extremity:  Pulses positive and No Edema Skin:   Wound C/D/I Neuro: Alert/Oriented and Abnormal Motor Left grip 3-, 2- HF, 3- KE, 4/5 ankle, RUE 5/5 Musc/Skel:  Swelling Left thigh improved. Mild swelling left fingers, Left forearm splint Sterno costal tenderness  Gen NAD   Assessment/Plan: 1. Functional deficits secondary to Left FNF, and radius fx which require 3+ hours per day of interdisciplinary therapy in a comprehensive inpatient rehab setting. Physiatrist is providing close team supervision and 24 hour management of active medical problems listed below. Physiatrist and rehab team  continue to assess barriers to discharge/monitor patient progress toward functional and medical goals. FIM: FIM - Bathing Bathing Steps Patient Completed: Chest;Right Arm;Left Arm;Abdomen;Front perineal area;Right upper leg;Left upper leg;Buttocks;Right lower leg (including foot) Bathing: 4: Min-Patient completes 8-9 83f 10 parts or 75+ percent  FIM - Upper Body Dressing/Undressing Upper body dressing/undressing steps patient completed: Thread/unthread right sleeve of front closure shirt/dress;Thread/unthread left sleeve of front closure shirt/dress;Button/unbutton shirt;Pull shirt around back of front closure shirt/dress Upper body dressing/undressing: 5: Set-up assist to: Obtain clothing/put away FIM - Lower Body Dressing/Undressing Lower body dressing/undressing steps patient completed: Thread/unthread right underwear leg;Thread/unthread left underwear leg;Pull underwear up/down;Thread/unthread right pants leg;Thread/unthread left pants leg;Pull pants up/down;Don/Doff right sock Lower body dressing/undressing: 3: Mod-Patient completed 50-74% of tasks  FIM - Toileting Toileting steps completed by patient: Adjust clothing prior to toileting;Performs perineal hygiene;Adjust clothing after toileting Toileting Assistive Devices: Grab bar or rail for support Toileting: 4: Steadying assist  FIM - Radio producer Devices: Grab bars;Bedside commode;Walker (walker into bathroom, w/c out) Toilet Transfers: 4-From toilet/BSC: Min A (steadying Pt. > 75%);4-To toilet/BSC: Min A (steadying Pt. > 75%)  FIM - Control and instrumentation engineer Devices: Walker;Arm rests Bed/Chair Transfer: 0: Activity did not occur  FIM - Locomotion: Wheelchair Distance: 30 Locomotion: Wheelchair: 0: Activity did not occur FIM - Locomotion: Ambulation Locomotion: Ambulation Assistive Devices: Engineer, agricultural Ambulation/Gait Assistance: Not tested (comment) Locomotion:  Ambulation: 0: Activity did not occur  Comprehension Comprehension Mode: Auditory Comprehension: 5-Understands basic 90% of the time/requires cueing < 10% of the time  Expression  Expression Mode: Verbal Expression: 5-Expresses complex 90% of the time/cues < 10% of the time  Social Interaction Social Interaction: 7-Interacts appropriately with others - No medications needed.  Problem Solving Problem Solving: 5-Solves complex 90% of the time/cues < 10% of the time  Memory Memory: 5-Recognizes or recalls 90% of the time/requires cueing < 10% of the time   Medical Problem List and Plan:  1. Functional deficits secondary to left femoral neck fracture, left femoral shaft fracture, distal left radius and ulna fracture.  2. DVT Prophylaxis/Anticoagulation: Pharmaceutical: Other (comment)--Eliquis.  3. Pain Management: prn medications effective. History of fibromyalgia she thinks her doctors have tried different fibromyalgia medicines but is not sure which ones, consider gabapentin , Kpad, counter irritant cream   4. Mood: LCSW to follow for evaluation and support.  5. Neuropsych: This patient is capable of making decisions on her own behalf.  6. Skin/Wound Care: Routine pressure relief measures. Maintain adequate nutritional status. Will add nutritional supplement.  7. HTN: Will continue to hold Norvasc. Check orthostatic BP. Monitor BP on tid basis and further adjust medications as indicated. Continue benazepril daily.  8. ABLA: Will recheck labs on Monday. Continue iron supplement.  9. Hyponatremia/Hypokalemia: Resolved 10. UTI Sens to levaquin 11.  Neck pain probable spondylosis- cont topical cream, Kpad, analgesics and PT LOS (Days) 10 A FACE TO FACE EVALUATION WAS PERFORMED  KIRSTEINS,ANDREW E 10/27/2013, 8:27 AM

## 2013-10-27 NOTE — Progress Notes (Signed)
Physical Therapy Session Note  Patient Details  Name: Jenna Ramos MRN: [id number] Date of Birth: 1928-04-21  Today's Date: 10/27/2013 PT Individual Time: 2542-7062 PT Individual Time Calculation (min): 30 min   Short Term Goals: Week 2:  PT Short Term Goal 1 (Week 2): = LTGS due to LOS  Skilled Therapeutic Interventions/Progress Updates:   Pt received sitting in w/c in room, agreeable to session.  Pt states she would like to return to bed following session and was having increased pain in R shoulder from fibromyalgia, therefore assisted back to bed via squat pivot transfer at min A with mod cues for TDWB on LLE and assist at L elbow for WB assist.  While in supine, performed therex for strengthening of LLE; ankle pumps x 15 reps, quad sets x 10 reps, SLR (active assisted) x 10 reps, SAQs x 10 reps, hip abd/add x 10 reps, and glute sets x 10 reps.  Questioned pt about THP and pt able to name 3/3 precautions and WB status for LLE.  Also continued to provide education on installing ramp for safe entry of home.  She states that her sister has already arranged to have one built.  Pt left in bed with all needs in reach and bed alarm set.    Therapy Documentation Precautions:  Precautions Precautions: Posterior Hip;Fall;Other (comment) Precaution Comments: L forearm casted - NWB Restrictions Weight Bearing Restrictions: Yes LUE Weight Bearing: Weight bear through elbow only LLE Weight Bearing: Touchdown weight bearing Other Position/Activity Restrictions: currently using platform walker on L UE   Vital Signs: Therapy Vitals Temp: 99.7 F (37.6 C) Temp src: Oral Pulse Rate: 76 Resp: 18 BP: 134/40 mmHg Patient Position (if appropriate): Sitting Oxygen Therapy SpO2: 99 % O2 Device: None (Room air) Pain: Pain Assessment Pain Score: 3  (at rest) Pain Location: Shoulder Pain Orientation: Right Pain Intervention(s): Medication (See eMAR);Heat applied   See FIM for current  functional status  Therapy/Group: Individual Therapy  Denice Bors 10/27/2013, 4:37 PM

## 2013-10-28 ENCOUNTER — Inpatient Hospital Stay (HOSPITAL_COMMUNITY): Payer: Medicare Other | Admitting: *Deleted

## 2013-10-28 ENCOUNTER — Encounter (HOSPITAL_COMMUNITY): Payer: Federal, State, Local not specified - PPO | Admitting: Occupational Therapy

## 2013-10-28 DIAGNOSIS — M797 Fibromyalgia: Secondary | ICD-10-CM | POA: Diagnosis present

## 2013-10-28 DIAGNOSIS — D649 Anemia, unspecified: Secondary | ICD-10-CM

## 2013-10-28 DIAGNOSIS — D62 Acute posthemorrhagic anemia: Secondary | ICD-10-CM | POA: Diagnosis present

## 2013-10-28 DIAGNOSIS — S72009A Fracture of unspecified part of neck of unspecified femur, initial encounter for closed fracture: Secondary | ICD-10-CM

## 2013-10-28 DIAGNOSIS — N390 Urinary tract infection, site not specified: Secondary | ICD-10-CM

## 2013-10-28 HISTORY — DX: Urinary tract infection, site not specified: N39.0

## 2013-10-28 MED ORDER — GABAPENTIN 100 MG PO CAPS
200.0000 mg | ORAL_CAPSULE | Freq: Three times a day (TID) | ORAL | Status: DC
  Administered 2013-10-28 – 2013-10-30 (×6): 200 mg via ORAL
  Filled 2013-10-28 (×9): qty 2

## 2013-10-28 MED ORDER — TRAMADOL HCL 50 MG PO TABS
50.0000 mg | ORAL_TABLET | Freq: Four times a day (QID) | ORAL | Status: DC
  Administered 2013-10-28 – 2013-10-29 (×3): 50 mg via ORAL
  Filled 2013-10-28 (×4): qty 1

## 2013-10-28 NOTE — Progress Notes (Signed)
Recreational Therapy Session Note  Patient Details  Name: BIRTTANY DECHELLIS MRN: [id number] Date of Birth: December 07, 1928 Today's Date: 10/28/2013  Session cancelled due to pt with c/o 9/10 neck pain, pt states she was premedicated. Shauntelle Jamerson Blakeman 10/28/2013, 4:39 PM

## 2013-10-28 NOTE — Plan of Care (Signed)
Problem: RH SKIN INTEGRITY Goal: RH STG ABLE TO PERFORM INCISION/WOUND CARE W/ASSISTANCE STG Able To Perform Incision/Wound Care With mod Assistance.  Helper performs wound care

## 2013-10-28 NOTE — Progress Notes (Signed)
Occupational Therapy Session Note  Patient Details  Name: Jenna Ramos MRN: [id number] Date of Birth: [birth year]  Today's Date: 10/28/2013 OT Individual Time: 0900-1000 OT Individual Time Calculation (min): 60 min    Skilled Therapeutic Interventions/Progress Updates:      Pt seen for BADL retraining of toileting, bathing, and dressing with a focus on family education with patient's main caregiver her daughter in law Jenna Ramos (for the first 30 min of the session). Pt was increased pain today and much more limited in her movements from her fibromyalgia. Jenna Ramos stated the pt has had these FM flare ups in the past several times in which she has difficulty moving.  Pt overall was able to complete transfers with min a and self care with min to mod A.  She stated she had to have more A as it was too painful to move her R side. Discussed pt ordering a tub bench for her to use at home once the precautions are lifted for her hip.  Also discussed the need for pt to have 24/7 A and for her caregivers to be comfortable providing her assist.  Jenna Ramos stated that she has all the details worked out for the caregivers. Pt requested to go back to bed. Pt adjusted in bed with mod A and heat applied to her back. Pt with phone and call light in reach.  Therapy Documentation Precautions:  Precautions Precautions: Posterior Hip;Fall;Other (comment) Precaution Comments: L forearm casted - NWB Restrictions Weight Bearing Restrictions: Yes LUE Weight Bearing: Partial weight bearing LLE Weight Bearing: Touchdown weight bearing Other Position/Activity Restrictions: currently using platform walker on L UE   Pain: Pain Assessment Pain Assessment: 0-10 Pain Score: 9  Faces Pain Scale: Hurts a little bit Pain Type: Acute pain Pain Location: Neck Pain Radiating Towards: back Pain Descriptors / Indicators: Aching;Tightness Pain Intervention(s): Medication (See eMAR);Heat applied ADL:  See FIM for current  functional status  Therapy/Group: Individual Therapy  [place] 10/28/2013, 11:46 AM

## 2013-10-28 NOTE — Progress Notes (Addendum)
Physical Therapy Session Note  Patient Details  Name: Jenna Ramos MRN: [id number] Date of Birth: 10-02-1928  Today's Date: 10/28/2013 PT Individual Time: 10:30- 11:30 (2mn) and 14:30-14:55 (259m) - 3531mmissed due to pain     Short Term Goals: Week 1:  PT Short Term Goal 1 (Week 1): Pt will demonstrate supine to sit transfer req min A.  PT Short Term Goal 1 - Progress (Week 1): Not met PT Short Term Goal 2 (Week 1): Pt will demonstrate bed to w/c transfer req CGA with L platform walker.  PT Short Term Goal 2 - Progress (Week 1): Not met PT Short Term Goal 3 (Week 1): Pt will ambulate 20' with L platform walker, while maintain precautions, req CGA.  PT Short Term Goal 3 - Progress (Week 1): Not met PT Short Term Goal 4 (Week 1): Pt will self propel manual w/c x 50' req min A.  PT Short Term Goal 4 - Progress (Week 1): Partly met PT Short Term Goal 5 (Week 1): Pt will ascend/descend 1 stair with rail req mod A.  PT Short Term Goal 5 - Progress (Week 1): Not met Week 2:  PT Short Term Goal 1 (Week 2): = LTGS due to LOS  Skilled Therapeutic Interventions/Progress Updates:  Pt in great neck pain this morning, pre-medicated and lying on K-pad, unable to move her head. Pt wanting to proceed with bedside tx despite very slow pace and increased pain. Instructed pt in supine therex with cues and passive stretching for L HS and heel cord.   Performed each of the following x20 in small intervals due to pain: ankle pumps, quad sets, glute sets, heel slides, SAQ, SLR, hip ABD, hip ADD.  Passive LE stretching x2mi25mach group HS and heel cords to tolerable range.  Reviewed hip precautions and functional implications as well as TDWB. Encouraged pt to not rule SNF at DC as an option should her pain and immobility persist. Pt hoping to DC home.    Second tx, pt in 9/10 pain in neck despite premedicated. Pain lowered to 5/10 by end of short tx including manual therapy for back, shoulder, neck  pain relief.  Pt up in WC, [place] to move head, wincing in pain with no movement. Performed stand-pivot transfer with TDWB on L and lifting assist under L axilla. Pt able to wiggle up EOB for optimal positioning. Pt needed Mod A for LEs to supine position.  Assisted pt to L sidelying with hip precautions and performed manual therapy 15mi50mr pain with good outcome. Pt able to verbalize hip precautions. Pt elected to end tx early due to pain and limited ability to participate.      Therapy Documentation Precautions:  Precautions Precautions: Posterior Hip;Fall;Other (comment) Precaution Comments: L forearm casted - NWB Restrictions Weight Bearing Restrictions: Yes LUE Weight Bearing: Partial weight bearing LLE Weight Bearing: Touchdown weight bearing Other Position/Activity Restrictions: currently using platform walker on L UE Pain: Pain Assessment Pain Score: 9  Pain Type: Acute pain Pain Location: Neck Pain Radiating Towards: back Pain Descriptors / Indicators: Aching;Tightness Pain Intervention(s): Medication (See eMAR);Heat applied  See FIM for current functional status  Therapy/Group: Individual Therapy  Marley Charlot Kennieth Rad DPT  10/28/2013, 10:58 AM

## 2013-10-28 NOTE — Progress Notes (Signed)
80 year old female with history of HTN, RA, fibromyalgia, macular degeneration who caught her toe on the edge of a stair, tripped and fell on her steps on 10/12/13 with onset of severe left hip and left wrist pain and inability to ambulate. She was taken to [facility] ED and workup revealed displaced left femoral neck fracture and distal left radius and ulna fracture. X ray showed question of lytic lesion and NM body scan negative for metastasis. She underwent left hip bipolar hemi and ORIF left wrist by Dr. Derald Macleod on 10/14/13. Post op xrays done revealing small fracture distal to tip of prosthesis on posterior aspect of femur and she was taken to OR for ORIF left periprosthetic fracture on 10/15/13. Post op posterior hip precautions X 2 months and is TTWB LLE X 6 weeks.    Subjective/Complaints: Pt still with neck pain, slightly better, wants ASA but discussed contraindication while on Eliquis No other c/os no shooting pain in arms or numbness  Review of Systems - urgency  Objective: Vital Signs: Blood pressure 144/49, pulse 77, temperature 99.9 F (37.7 C), temperature source Oral, resp. rate 18, height 5\' 3"  (1.6 m), weight 63.504 kg (140 lb), SpO2 100.00%. No results found. No results found for this or any previous visit (from the past 72 hour(s)).   HEENT: normal Cardio: RRR Resp: CTA B/L and unlabored GI: BS positive and Non distended Extremity:  Pulses positive and No Edema Skin:   Wound C/D/I Neuro: Alert/Oriented and Abnormal Motor Left grip 3-, 2- HF, 3- KE, 4/5 ankle, RUE 5/5 Musc/Skel:  Swelling Left thigh improved. Mild swelling left fingers, Left forearm splint, tenderness to Lt touch R side neck and shoulder Sterno costal tenderness  Gen NAD   Assessment/Plan: 1. Functional deficits secondary to Left FNF, and radius fx which require 3+ hours per day of interdisciplinary therapy in a comprehensive inpatient rehab setting. Physiatrist is providing close team  supervision and 24 hour management of active medical problems listed below. Physiatrist and rehab team continue to assess barriers to discharge/monitor patient progress toward functional and medical goals. FIM: FIM - Bathing Bathing Steps Patient Completed: Chest;Right Arm;Left Arm;Abdomen;Front perineal area;Right upper leg;Left upper leg;Buttocks;Right lower leg (including foot) Bathing: 4: Min-Patient completes 8-9 34f 10 parts or 75+ percent  FIM - Upper Body Dressing/Undressing Upper body dressing/undressing steps patient completed: Thread/unthread right sleeve of front closure shirt/dress;Thread/unthread left sleeve of front closure shirt/dress;Button/unbutton shirt;Pull shirt around back of front closure shirt/dress Upper body dressing/undressing: 5: Set-up assist to: Obtain clothing/put away FIM - Lower Body Dressing/Undressing Lower body dressing/undressing steps patient completed: Thread/unthread right underwear leg;Thread/unthread left underwear leg;Pull underwear up/down;Thread/unthread right pants leg;Thread/unthread left pants leg;Don/Doff right sock Lower body dressing/undressing: 3: Mod-Patient completed 50-74% of tasks  FIM - Toileting Toileting steps completed by patient: Adjust clothing prior to toileting;Performs perineal hygiene;Adjust clothing after toileting Toileting Assistive Devices: Grab bar or rail for support Toileting: 4: Steadying assist  FIM - Radio producer Devices: Grab bars;Bedside commode;Walker (walker into bathroom, w/c out) Toilet Transfers: 4-From toilet/BSC: Min A (steadying Pt. > 75%);4-To toilet/BSC: Min A (steadying Pt. > 75%)  FIM - Control and instrumentation engineer Devices: Walker;Arm rests Bed/Chair Transfer: 4: Supine > Sit: Min A (steadying Pt. > 75%/lift 1 leg);4: Bed > Chair or W/C: Min A (steadying Pt. > 75%)  FIM - Locomotion: Wheelchair Distance: 30 Locomotion: Wheelchair: 0: Activity did not  occur FIM - Locomotion: Ambulation Locomotion: Ambulation Assistive Devices: Engineer, agricultural  Ambulation/Gait Assistance: Not tested (comment) Locomotion: Ambulation: 0: Activity did not occur  Comprehension Comprehension Mode: Auditory Comprehension: 5-Understands complex 90% of the time/Cues < 10% of the time  Expression Expression Mode: Verbal Expression: 5-Expresses complex 90% of the time/cues < 10% of the time  Social Interaction Social Interaction: 5-Interacts appropriately 90% of the time - Needs monitoring or encouragement for participation or interaction.  Problem Solving Problem Solving: 5-Solves complex 90% of the time/cues < 10% of the time  Memory Memory: 5-Recognizes or recalls 90% of the time/requires cueing < 10% of the time   Medical Problem List and Plan:  1. Functional deficits secondary to left femoral neck fracture, left femoral shaft fracture, distal left radius and ulna fracture.  2. DVT Prophylaxis/Anticoagulation: Pharmaceutical: Other (comment)--Eliquis.  3. Pain Management: prn medications effective. History of fibromyalgia she thinks her doctors have tried different fibromyalgia medicines but is not sure which ones, consider gabapentin , Kpad, counter irritant cream   4. Mood: LCSW to follow for evaluation and support.  5. Neuropsych: This patient is capable of making decisions on her own behalf.  6. Skin/Wound Care: Routine pressure relief measures. Maintain adequate nutritional status. Will add nutritional supplement.  7. HTN: Will continue to hold Norvasc. Check orthostatic BP. Monitor BP on tid basis and further adjust medications as indicated. Continue benazepril daily.  8. ABLA: Will recheck labs on Monday. Continue iron supplement.  9. Hyponatremia/Hypokalemia: Resolved 10. UTI Sens to levaquin 11.  Neck pain probable spondylosis- cont topical cream, Kpad, analgesics and PT LOS (Days) 11 A FACE TO FACE EVALUATION WAS  PERFORMED  KIRSTEINS,ANDREW E 10/28/2013, 8:51 AM

## 2013-10-29 ENCOUNTER — Inpatient Hospital Stay (HOSPITAL_COMMUNITY): Payer: Medicare Other

## 2013-10-29 ENCOUNTER — Encounter (HOSPITAL_COMMUNITY): Payer: Federal, State, Local not specified - PPO | Admitting: Occupational Therapy

## 2013-10-29 ENCOUNTER — Encounter (INDEPENDENT_AMBULATORY_CARE_PROVIDER_SITE_OTHER): Payer: BC Managed Care – PPO | Admitting: Ophthalmology

## 2013-10-29 MED ORDER — TRAMADOL HCL 50 MG PO TABS: 50.0000 mg | ORAL_TABLET | Freq: Four times a day (QID) | ORAL | Status: DC | PRN

## 2013-10-29 MED ORDER — OXYCODONE HCL 5 MG PO TABS
5.0000 mg | ORAL_TABLET | Freq: Three times a day (TID) | ORAL | Status: DC
  Administered 2013-10-29 – 2013-10-30 (×5): 5 mg via ORAL
  Filled 2013-10-29 (×5): qty 1

## 2013-10-29 NOTE — Discharge Summary (Signed)
Physician Discharge Summary  Patient ID: Jenna Ramos MRN: [id number] DOB/AGE: [birth year] 80 y.o.  Admit date: 10/17/2013 Discharge date: 10/30/2013  Discharge Diagnoses:  Principal Problem:   Hip fracture requiring operative repair Active Problems:   Anemia   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   Fibromyalgia muscle pain   Acute blood loss anemia   Discharged Condition: Stable.   Significant Diagnostic Studies: Dg Tibia/fibula Left  10/21/2013   CLINICAL DATA:  Left leg pain.  EXAM: LEFT TIBIA AND FIBULA - 2 VIEW  COMPARISON:  None.  FINDINGS: There is no evidence of acute fracture or other focal bone lesions. Soft tissues are unremarkable.  IMPRESSION: No significant abnormality seen involving the left tibia or fibula.   Electronically Signed   By: Sabino Dick M.D.   On: 10/21/2013 11:28    Labs:  Basic Metabolic Panel: BMET    Component Value Date/Time   NA 139 10/18/2013 0453   NA 137 10/24/2010   K 3.9 10/18/2013 0453   CL 101 10/18/2013 0453   CO2 27 10/18/2013 0453   GLUCOSE 140* 10/18/2013 0453   BUN 15 10/18/2013 0453   CREATININE 0.63 10/18/2013 0453   CALCIUM 9.1 10/18/2013 0453   GFRNONAA 81* 10/18/2013 0453   GFRAA >90 10/18/2013 0453      CBC:    Component Value Date/Time   WBC 10.9* 10/18/2013 0453   RBC 3.39* 10/18/2013 0453   HGB 9.2* 10/18/2013 0453   HCT 28.1* 10/18/2013 0453   PLT 292 10/18/2013 0453   MCV 82.9 10/18/2013 0453   MCH 27.1 10/18/2013 0453   MCHC 32.7 10/18/2013 0453   RDW 14.6 10/18/2013 0453   LYMPHSABS 2.0 10/18/2013 0453   MONOABS 1.2* 10/18/2013 0453   EOSABS 0.5 10/18/2013 0453   BASOSABS 0.1 10/18/2013 0453     CBG: No results found for this basename: GLUCAP,  in the last 168 hours  Brief HPI:   Ms. Jenna Ramos is an 80 year old female with history of HTN, RA, fibromyalgia, macular degeneration who caught her toe on the edge of a stair, tripped and fell on her steps on 10/12/13 with onset of severe  left hip and left wrist pain and inability to ambulate. She was taken to [facility] ED and workup revealed displaced left femoral neck fracture and distal left radius and ulna fracture.  She underwent left hip bipolar hemi and ORIF left wrist by Dr. Derald Macleod on 10/14/13. Post op xrays done revealing small fracture distal to tip of porthesis on posterior aspect of femur and she was taken to OR for ORIF left periprosthetic fracture on 10/15/13. Post op posterior hip precautions X 2 months and is TTWB LLE X 6 weeks.  Therapy ongoing and CIR recommended by Rehab team.    Hospital Course: [name] was admitted to rehab 10/17/2013 for inpatient therapies to consist of PT, ST and OT at least three hours five days a week. Past admission physiatrist, therapy team and rehab RN have worked together to provide customized collaborative inpatient rehab. She was maintained on Eliquis for DVT prophylaxis. She has had flare up of muscle pain due to fibromyalgia and was started on Neurontin as well as tramadol for pain management.  She was advised that ASA contraindicated due to Eliquis on board. Orthostatic blood pressures were monitored due to reports of dizziness and have been stable on Lotensin daily. She was found to have Morganella UTI at admission and was treated with  Levaquin X 5 days. Hip incisions have been monitored daily and have been healing well without s/s of infection. ABLA is stable and she is to continue on iron supplement past discharge.   Patient's progress has been limited by exacerbation of neck and shoulder pain due to fibromyalgia. Oxycodone was scheduled qid in addition to local measures and this has been effective in pain management. Staples were removed from left hip without difficulty at discharge. Left forearm sutures were d/c and splint reapplied. Incisions are healing well--clean,dry, intact, without s/s of infection. She will continue to receive HHPT, [place], [place] and [place] aide  by [facility] past discharge. She is to continue hip precautions till cleared by ortho.    Rehab course: During patient's stay in rehab weekly team conferences were held to monitor patient's progress, set goals and discuss barriers to discharge. Patient has had improvement in activity tolerance, balance, postural control, as well as ability to compensate for deficits. She requires min assist with bathing as well as UB dressing and mod assist with lower body dressing tasks.  She is ambulating 50 feet with PFRW and supervision. Family education was done with daughter-in-law regarding all transfers as well as equipment use.     Disposition:  Home.   Diet: Regular  Special Instructions: 1. Touch down weight on Left leg. 2. Note difference in blood pressure medications.     Medication List    STOP taking these medications       amLODipine-benazepril 5-20 MG per capsule  Commonly known as:  LOTREL     aspirin 325 MG EC tablet     aspirin EC 325 MG tablet      TAKE these medications       apixaban 2.5 MG Tabs tablet  Commonly known as:  ELIQUIS  Take 1 tablet (2.5 mg total) by mouth 2 (two) times daily.     benazepril 10 MG tablet  Commonly known as:  LOTENSIN  Take 1 tablet (10 mg total) by mouth daily.     bimatoprost 0.03 % ophthalmic solution  Commonly known as:  LUMIGAN  Place 1 drop into both eyes at bedtime.     CALCIUM 600/VITAMIN D 600-400 MG-UNIT per tablet  Generic drug:  Calcium Carbonate-Vitamin D  Take 1 tablet by mouth daily.     Fish Oil 1200 MG Caps  Take 1 capsule by mouth 3 (three) times daily with meals.     gabapentin 100 MG capsule  Commonly known as:  NEURONTIN  Take 2 capsules (200 mg total) by mouth 3 (three) times daily. For fibromyalgia     iron polysaccharides 150 MG capsule  Commonly known as:  NIFEREX  Take 1 capsule (150 mg total) by mouth 2 (two) times daily before lunch and supper.     levothyroxine 88 MCG tablet  Commonly  known as:  SYNTHROID, LEVOTHROID  Take 44-88 mcg by mouth daily. 1 daily Mon-Fri and 1/2 tab on Sat-Sun     methocarbamol 750 MG tablet--Rx 90 pills/1 refill   Commonly known as:  ROBAXIN  Take 1 tablet (750 mg total) by mouth every 6 (six) hours as needed for muscle spasms.     multivitamins ther. w/minerals Tabs tablet  Take 1 tablet by mouth daily.     PRESERVISION AREDS 2 PO  Take 1 capsule by mouth 2 (two) times daily.     MUSCLE RUB 10-15 % Crea  Apply 1 application topically 3 (three) times daily. To shoulders/neck  oxyCODONE 5 MG immediate release tablet--Rx 100 pills   Commonly known as:  Oxy IR/ROXICODONE  Take 1 tablet (5 mg total) by mouth every 6 (six) hours as needed for severe pain.     polyethylene glycol packet  Commonly known as:  MIRALAX / GLYCOLAX  Take 17 g by mouth daily.     senna-docusate 8.6-50 MG per tablet  Commonly known as:  Senokot-S  Take 1 tablet by mouth at bedtime.     traMADol 50 MG tablet--Rx # 60 pills   Commonly known as:  ULTRAM  Take 1 tablet (50 mg total) by mouth every 6 (six) hours as needed for moderate pain.       Follow-up Information   Call Charlett Blake, MD. (As needed)    Specialty:  Physical Medicine and Rehabilitation   Contact information:   [place] [address] [phone]       Follow up with Derald Macleod MD. Call today. (to make a follow up appointment)       Follow up with Cleda Mccreedy, MD. Call today. (for post hospital follow up in 2 weeks. )    Specialty:  Internal Medicine   Contact information:   [place] [address] [phone]       Signed: Bary Leriche 10/30/2013, 1:01 PM

## 2013-10-29 NOTE — Progress Notes (Signed)
Physical Therapy Discharge Summary  Patient Details  Name: Jenna Ramos MRN: [id number] Date of Birth: 06/21/1928  Today's Date: 10/29/2013 PT Individual Time: 1115-1215, 1315-1430 PT Individual Time Calculation (min): 60 min , 75 min   Patient has met 7 of 7 long term goals due to improved activity tolerance, increased strength, decreased pain, ability to compensate for deficits and functional use of  left upper extremity and left lower extremity.  Patient to discharge at an ambulatory level Supervision.   Patient's care partner is independent to provide the necessary cognitive assistance at discharge.  Reasons goals not met: n/a  Recommendation:  Patient will benefit from ongoing skilled PT services in home health setting to continue to advance safe functional mobility, address ongoing impairments in strength, balance, pain, activity tolerance, coordination and minimize fall risk.  Equipment: manual w/c, basic w/c seat and back cushions; LPFRW  Reasons for discharge: treatment goals met and discharge from hospital; pt chose to d/c 1 day early in order to see surgeon at [facility] who performed her surgeries, for suture removal  Patient/family agrees with progress made and goals achieved: Yes  PT Discharge  tx 1: Pain rated 4/10 L hip and R neck.  K pad is helpful for R shoulder/neck, and pt uses it whenever she can. Bed mobility modified independent in flat bed, without rail, but very slow due to pain and stiffness especially after pt has spent time in bed.  Gait in room with LPFRW, x 15', supervision; pt does not need VCs; she initially places both hands on the RW, but then places R hand on armrest or bed to push up.  Turning and backing up as she approaches transfer surface are improved.  Simulated car transfer to bench seat, entering from driver's side to accomodate LLE and observe hip precautions, close supervision.  Daughter in-law [name] arrive for training; she return  demonstrated exiting and entering car again.  She was also instructed in and return demonstrated folding up w/c and RW. [name] also safely supervised pt as she ambulated car> w/c. Pt left sitting up in w/c; she will use call bell to ask for help to use toilet.  Tx 2:  Pt sound asleep; difficult to arouse.  Given time, raised HOB and eventually a few sips of water, pt was alert.  Transfer as above.  Therapist donned L shoe using shoe horn. Tx focus on gait in room, toilet transfer, standing therapeutic activities of tapping beach ball with L hand and kicking with L foot, and w/c propulsion in standard ht w/c.  W/c propulsion using R hand, bil feet, x 50' with supervision including turns and in/out of cone obstacle course.  Return to room, staying up in w/c as desired.  Sandy present. Precautions/Restrictions Precautions Precautions: Posterior Hip;Fall;Other (comment) Precaution Comments: L forearm casted - NWB Restrictions Weight Bearing Restrictions: Yes LUE Weight Bearing: Partial weight bearing LLE Weight Bearing: Touchdown weight bearing Other Position/Activity Restrictions: currently using platform walker on L UE Vital Signs Therapy Vitals Temp: 98.5 F (36.9 C) Temp src: Oral Pulse Rate: 78 Resp: 18 BP: 142/43 mmHg Patient Position (if appropriate): Lying Oxygen Therapy SpO2: 99 % O2 Device: None (Room air) Pain Pain Assessment Pain Score: 4  Pain Location: Neck (also L hip) Pain Orientation: Right Pain Intervention(s): Medication (See eMAR) Vision/Perception - no change    Cognition Overall Cognitive Status: Within Functional Limits for tasks assessed Arousal/Alertness: Lethargic Orientation Level: Oriented X4 Memory: Appears intact Sensation Sensation Light Touch: Appears Intact  Proprioception: Appears Intact Coordination Gross Motor Movements are Fluid and Coordinated: No Fine Motor Movements are Fluid and Coordinated: No Heel Shin Test: NT Motor  Motor Motor:  Other (comment) Motor - Skilled Clinical Observations: severe muscle pain and weakness exacerbated by fibromyalgia  Mobility Bed Mobility Bed Mobility:  (supervision, very slowly and limited by pain) Sitting - Scoot to Edge of Bed: 5: Supervision Transfers Transfers: Yes Sit to Stand: 5: Supervision Stand to Sit: 5: Supervision Stand to Sit Details: using LPFRW, and L forearm placed in trough throughout transfer to assist with L forearm wt bearing Stand Pivot Transfers: 5: Supervision Stand Pivot Transfer Details (indicate cue type and reason): with LPFRW Locomotion  Ambulation Ambulation: Yes Ambulation/Gait Assistance: 5: Supervision Ambulation Distance (Feet): 15 Feet Assistive device: Left platform walker Ambulation/Gait Assistance Details: cues for placement of RW as she turns and backs up to next surface Gait Gait: Yes Gait Pattern: Impaired Gait Pattern: Antalgic;Step-through pattern;Decreased stance time - left;Decreased dorsiflexion - right;Decreased hip/knee flexion - left;Decreased step length - right;Narrow base of support Stairs / Additional Locomotion Stairs: No Ramp: Not tested (comment) Curb: Not tested (comment) Wheelchair Mobility Wheelchair Mobility: Yes Wheelchair Assistance: 5: Supervision Wheelchair Propulsion: Right upper extremity;Both lower extermities Wheelchair Parts Management: Needs assistance Distance: 50  Trunk/Postural Assessment  Cervical Assessment Cervical Assessment: Exceptions to WFL Cervical AROM Overall Cervical AROM Comments: limited by pain, fibromyalgia Thoracic Assessment Thoracic Assessment: Within Functional Limits Lumbar Assessment Lumbar Assessment: Within Functional Limits Postural Control Postural Control: Deficits on evaluation Protective Responses: slow to respond Postural Limitations: decreased postural control due to WBing limitations on LLE/LUE  Balance Balance Balance Assessed: Yes Static Sitting Balance Static  Sitting - Balance Support: Feet supported Static Sitting - Level of Assistance: 6: Modified independent (Device/Increase time) Dynamic Sitting Balance Dynamic Sitting - Balance Support: Feet supported Dynamic Sitting - Level of Assistance: 5: Stand by assistance Dynamic Sitting Balance - Compensations: limited due to HIp precautions Static Standing Balance Static Standing - Balance Support: Bilateral upper extremity supported Static Standing - Level of Assistance: 5: Stand by assistance Static Standing - Comment/# of Minutes: limited by R hip pain at times Dynamic Standing Balance Dynamic Standing - Balance Support: Right upper extremity supported;During functional activity Dynamic Standing - Level of Assistance: 4: Min assist Dynamic Standing - Balance Activities: Ball toss;Other (comment) (kicking beach ball while standing with RW) Extremity Assessment      RLE Assessment RLE Assessment: Within Functional Limits LLE Assessment LLE Assessment: Exceptions to WFL LLE AROM (degrees) Overall AROM Left Lower Extremity: Deficits;Due to decreased strength;Due to precautions;Due to pain LLE Strength LLE Overall Strength: Deficits;Due to precautions;Due to pain LLE Overall Strength Comments: grossly in sitting: at least 3/5 hip flexion (within range allowed by hip precautins), knee ext; at least 4/5 ankle DF; minimal resistance given due to pain and fx  See FIM for current functional status  COOK,CAROLINE 10/29/2013, 5:28 PM   

## 2013-10-29 NOTE — Progress Notes (Signed)
Recreational Therapy Discharge Summary Patient Details  Name: GIOIA RANES MRN: [id number] Date of Birth: 1928/04/12 Today's Date: 10/29/2013  Long term goals set: 1  Long term goals met: 1  Comments on progress toward goals: Pt met supervision level for simple seated tasks requiring set up assist for completion.  Pt continues to be limited by pain, but is motivated to continue to work hard and gain independence. Reasons for discharge: discharge from hospital Patient/family agrees with progress made and goals achieved: Yes  Kaeli Nichelson 10/29/2013, 12:53 PM

## 2013-10-29 NOTE — Progress Notes (Signed)
80 year old female with history of HTN, RA, fibromyalgia, macular degeneration who caught her toe on the edge of a stair, tripped and fell on her steps on 10/12/13 with onset of severe left hip and left wrist pain and inability to ambulate. She was taken to [facility] ED and workup revealed displaced left femoral neck fracture and distal left radius and ulna fracture. X ray showed question of lytic lesion and NM body scan negative for metastasis. She underwent left hip bipolar hemi and ORIF left wrist by Dr. Derald Macleod on 10/14/13. Post op xrays done revealing small fracture distal to tip of prosthesis on posterior aspect of femur and she was taken to OR for ORIF left periprosthetic fracture on 10/15/13. Post op posterior hip precautions X 2 months and is TTWB LLE X 6 weeks.    Subjective/Complaints: Pt still with neck pain, slightly better, wants ASA but discussed contraindication while on Eliquis On scheduled tramadol, still with reduced ROM No numbness or tingling in arms  Review of Systems - urgency  Objective: Vital Signs: Blood pressure 131/50, pulse 74, temperature 99.2 F (37.3 C), temperature source Oral, resp. rate 17, height 5\' 3"  (1.6 m), weight 63.504 kg (140 lb), SpO2 95.00%. No results found. No results found for this or any previous visit (from the past 72 hour(s)).   HEENT: normal Cardio: RRR Resp: CTA B/L and unlabored GI: BS positive and Non distended Extremity:  Pulses positive and No Edema Skin:   Wound C/D/I Neuro: Alert/Oriented and Abnormal Motor Left grip 3-, 2- HF, 3- KE, 4/5 ankle, RUE 5/5 Musc/Skel:  Swelling Left thigh improved. Mild swelling left fingers, Left forearm splint, tenderness to Lt touch R side Anterior shoulder, no tenderness on R side neck but is tight in R trap Sterno costal tenderness  Gen NAD   Assessment/Plan: 1. Functional deficits secondary to Left FNF, and radius fx which require 3+ hours per day of interdisciplinary therapy  in a comprehensive inpatient rehab setting. Physiatrist is providing close team supervision and 24 hour management of active medical problems listed below. Physiatrist and rehab team continue to assess barriers to discharge/monitor patient progress toward functional and medical goals. FIM: FIM - Bathing Bathing Steps Patient Completed: Chest;Right Arm;Left Arm;Abdomen;Front perineal area;Right upper leg;Left upper leg;Buttocks Bathing: 4: Min-Patient completes 8-9 60f 10 parts or 75+ percent  FIM - Upper Body Dressing/Undressing Upper body dressing/undressing steps patient completed: Thread/unthread right sleeve of front closure shirt/dress;Thread/unthread left sleeve of front closure shirt/dress;Button/unbutton shirt;Pull shirt around back of front closure shirt/dress Upper body dressing/undressing: 5: Set-up assist to: Obtain clothing/put away FIM - Lower Body Dressing/Undressing Lower body dressing/undressing steps patient completed: Thread/unthread right underwear leg;Thread/unthread left underwear leg;Pull underwear up/down;Thread/unthread right pants leg;Thread/unthread left pants leg;Pull pants up/down Lower body dressing/undressing: 3: Mod-Patient completed 50-74% of tasks  FIM - Toileting Toileting steps completed by patient: Adjust clothing prior to toileting;Adjust clothing after toileting Toileting Assistive Devices: Grab bar or rail for support Toileting: 3: Mod-Patient completed 2 of 3 steps  FIM - Radio producer Devices: Grab bars;Bedside commode;Walker Toilet Transfers: 4-From toilet/BSC: Min A (steadying Pt. > 75%);4-To toilet/BSC: Min A (steadying Pt. > 75%)  FIM - Bed/Chair Transfer Bed/Chair Transfer Assistive Devices: Arm rests;Bed rails Bed/Chair Transfer: 3: Sit > Supine: Mod A (lifting assist/Pt. 50-74%/lift 2 legs);3: Chair or W/C > Bed: Mod A (lift or lower assist)  FIM - Locomotion: Wheelchair Distance: 30 Locomotion: Wheelchair: 0:  Activity did not occur FIM - Locomotion: Ambulation  Locomotion: Ambulation Assistive Devices: Engineer, agricultural Ambulation/Gait Assistance: Not tested (comment) Locomotion: Ambulation: 0: Activity did not occur  Comprehension Comprehension Mode: Auditory Comprehension: 5-Understands basic 90% of the time/requires cueing < 10% of the time  Expression Expression Mode: Verbal Expression: 5-Expresses complex 90% of the time/cues < 10% of the time  Social Interaction Social Interaction: 6-Interacts appropriately with others with medication or extra time (anti-anxiety, antidepressant).  Problem Solving Problem Solving: 5-Solves complex 90% of the time/cues < 10% of the time  Memory Memory: 6-More than reasonable amt of time   Medical Problem List and Plan:  1. Functional deficits secondary to left femoral neck fracture, left femoral shaft fracture, distal left radius and ulna fracture.  2. DVT Prophylaxis/Anticoagulation: Pharmaceutical: Other (comment)--Eliquis.  3. Pain Management: prn medications effective. History of fibromyalgia she thinks her doctors have tried different fibromyalgia medicines but is not sure which ones, consider gabapentin , Kpad, counter irritant cream   4. Mood: LCSW to follow for evaluation and support.  5. Neuropsych: This patient is capable of making decisions on her own behalf.  6. Skin/Wound Care: Routine pressure relief measures. Maintain adequate nutritional status. Will add nutritional supplement.  7. HTN: Will continue to hold Norvasc. Check orthostatic BP. Monitor BP on tid basis and further adjust medications as indicated. Continue benazepril daily.  8. ABLA: Will recheck labs on Monday. Continue iron supplement.  9. Hyponatremia/Hypokalemia: Resolved 10. UTI Sens to levaquin 11.  Neck pain probable spondylosis- cont topical cream, Kpad, try C2 analgesics monitor sedation/confusion and PT LOS (Days) 12 A FACE TO FACE EVALUATION WAS  PERFORMED  Kemuel Buchmann E 10/29/2013, 9:09 AM

## 2013-10-29 NOTE — Plan of Care (Signed)
Problem: RH Balance Goal: LTG Patient will maintain dynamic standing with ADLs (OT) LTG: Patient will maintain dynamic standing balance with assist during activities of daily living (OT)  Outcome: Not Met (add Reason) LTG not met as patient has had increased pain throughout her body for the last few days due to fibromyalgia symptoms that have become more pronounced throughout her stay.  Pt requires min A.  Problem: RH Grooming Goal: LTG Patient will perform grooming w/assist,cues/equip (OT) LTG: Patient will perform grooming with assist, with/without cues using equipment (OT)  Outcome: Not Met (add Reason) LTG not met as patient has had increased pain throughout her body for the last few days due to fibromyalgia symptoms that have become more pronounced throughout her stay. Pt needs set up A to apply the toothpaste.  Problem: RH Dressing Goal: LTG Patient will perform lower body dressing w/assist (OT) LTG: Patient will perform lower body dressing with assist, with/without cues in positioning using equipment (OT)  Outcome: Not Met (add Reason) LTG not met as patient has had increased pain throughout her body for the last few days due to fibromyalgia symptoms that have become more pronounced throughout her stay. Pt requires min to mod A with LB dressing.  Problem: RH Toileting Goal: LTG Patient will perform toileting w/assist, cues/equip (OT) LTG: Patient will perform toiletiing (clothes management/hygiene) with assist, with/without cues using equipment (OT)  Outcome: Not Met (add Reason) LTG not met as patient has had increased pain throughout her body for the last few days due to fibromyalgia symptoms that have become more pronounced throughout her stay.  Pt requires min to mod A.  Problem: RH Toilet Transfers Goal: LTG Patient will perform toilet transfers w/assist (OT) LTG: Patient will perform toilet transfers with assist, with/without cues using equipment (OT)  Outcome: Not Met (add  Reason) LTG not met as patient has had increased pain throughout her body for the last few days due to fibromyalgia symptoms that have become more pronounced throughout her stay. Pt requires min A.

## 2013-10-29 NOTE — Plan of Care (Signed)
Problem: RH PAIN MANAGEMENT Goal: RH STG PAIN MANAGED AT OR BELOW PT'S PAIN GOAL <3 on a 0-10 scale  Outcome: Not Progressing Pain remains high even with medication and heating pad in use

## 2013-10-29 NOTE — Progress Notes (Signed)
Social Work Patient ID: Jenna Ramos, female   DOB: August 25, 1928, 80 y.o.   MRN: [id number] Met with pt and [name]-daughter in-law who report family education went well and [name] feels comfortable with pt's care. Plan is for discharge tomorrow.  Team conference today and progressing toward her goals.  Family aware pt will require 24 hr care at home. Have arranged equipment and follow up via [facility], agency pt prefers.  [name] aware pt flucuates in her levels based upon her pain levels.

## 2013-10-29 NOTE — Progress Notes (Signed)
Occupational Therapy Session Note  Patient Details  Name: Jenna Ramos MRN: [id number] Date of Birth: [birth year]  Today's Date: 10/29/2013 OT Individual Time: 0902-1002 OT Individual Time Calculation (min): 60 min   Short Term Goals: Week 1:  OT Short Term Goal 1 (Week 1): Pt. will utilized AE for LB Bathing and dressing with minimal assist. OT Short Term Goal 1 - Progress (Week 1): Met OT Short Term Goal 2 (Week 1): Pt will bath LB with min assist plus AE. OT Short Term Goal 2 - Progress (Week 1): Met OT Short Term Goal 3 (Week 1): Pt. will be minimal assist with LB dressing OT Short Term Goal 3 - Progress (Week 1): Met OT Short Term Goal 4 (Week 1): Pt. will recall hip precautions and TTWB with one questioning cue OT Short Term Goal 4 - Progress (Week 1): Met OT Short Term Goal 5 (Week 1): Pt.  will adhere to hip precautions when doing bed,to walker to toilet transfers OT Short Term Goal 5 - Progress (Week 1): Met Week 2:  OT Short Term Goal 1 (Week 2): STGs = LTGs as pt's discharge date 10/31/13.  Skilled Therapeutic Interventions/Progress Updates:    Pt seen for BADL retraining at w/c level. Pt in bed and stated she was in more discomfort today in her shoulders. She had been premedicated prior to session.  Pt was able to sit up to EOB with min A and stand with RW to transfers, but she needed a great deal more A today with her self care as it was painful to lift both of her arms past 45 degrees and to reach forward.  Attempted gentle massage with gentle ROM to shoulders, but pt stated it was making the trigger points spasm more.  Pt opted to sit in w/c for a while. Pillows placed under both arms to support them. Call light, phone, and drink in reach.  Therapy Documentation Precautions:  Precautions Precautions: Posterior Hip;Fall;Other (comment) Precaution Comments: L forearm casted - NWB Restrictions Weight Bearing Restrictions: Yes LUE Weight Bearing: Partial weight  bearing LLE Weight Bearing: Touchdown weight bearing Other Position/Activity Restrictions: currently using platform walker on L UE    Pain: Pain Assessment Pain Assessment: 0-10 Pain Score: 9  Pain Type: Acute pain Pain Location: Shoulder Pain Orientation: Right;Left;Anterior Pain Descriptors / Indicators: Aching;Tender Pain Frequency: Constant Pain Onset: On-going Patients Stated Pain Goal: 4 Pain Intervention(s): Massage (pain cream applied) ADL:  See FIM for current functional status  Therapy/Group: Individual Therapy  [place] 10/29/2013, 11:58 AM

## 2013-10-29 NOTE — Progress Notes (Signed)
Occupational Therapy Discharge Summary  Patient Details  Name: Jenna Ramos MRN: [id number] Date of Birth: 09-08-1928   Patient has met 3 of 8 long term goals due to improved activity tolerance, improved balance and ability to compensate for deficits.  Patient to discharge at [facility] Assist level.  Patient's care partner is independent to provide the necessary physical assistance at discharge.    Reasons goals not met:  Problem: RH Balance  Goal: LTG Patient will maintain dynamic standing with ADLs (OT)  LTG: Patient will maintain dynamic standing balance with assist during activities of daily living (OT)  Outcome: Not Met (add Reason)  LTG not met as patient has had increased pain throughout her body for the last few days due to fibromyalgia symptoms that have become more pronounced throughout her stay. Pt requires min A.  Problem: RH Grooming  Goal: LTG Patient will perform grooming w/assist,cues/equip (OT)  LTG: Patient will perform grooming with assist, with/without cues using equipment (OT)  Outcome: Not Met (add Reason)  LTG not met as patient has had increased pain throughout her body for the last few days due to fibromyalgia symptoms that have become more pronounced throughout her stay. Pt needs set up A to apply the toothpaste.  Problem: RH Dressing  Goal: LTG Patient will perform lower body dressing w/assist (OT)  LTG: Patient will perform lower body dressing with assist, with/without cues in positioning using equipment (OT)  Outcome: Not Met (add Reason)  LTG not met as patient has had increased pain throughout her body for the last few days due to fibromyalgia symptoms that have become more pronounced throughout her stay. Pt requires min to mod A with LB dressing.  Problem: RH Toileting  Goal: LTG Patient will perform toileting w/assist, cues/equip (OT)  LTG: Patient will perform toiletiing (clothes management/hygiene) with assist, with/without cues using equipment  (OT)  Outcome: Not Met (add Reason)  LTG not met as patient has had increased pain throughout her body for the last few days due to fibromyalgia symptoms that have become more pronounced throughout her stay. Pt requires min to mod A.  Problem: RH Toilet Transfers  Goal: LTG Patient will perform toilet transfers w/assist (OT)  LTG: Patient will perform toilet transfers with assist, with/without cues using equipment (OT)  Outcome: Not Met (add Reason)  LTG not met as patient has had increased pain throughout her body for the last few days due to fibromyalgia symptoms that have become more pronounced throughout her stay. Pt requires min A.      Recommendation:  Patient will benefit from ongoing skilled OT services in home health setting to continue to advance functional skills in the area of BADL.  Equipment: tub bench  Reasons for discharge: lack of progress toward goals and family ready to provide the level of care pt needs and wants to take her home  Patient/family agrees with progress made and goals achieved: Yes  OT Discharge Pain Pain Assessment Pain Assessment: 0-10 Pain Score: 9  Pain Type: Acute pain Pain Location: Shoulder Pain Orientation: Right;Left;Anterior Pain Descriptors / Indicators: Aching;Tender Pain Frequency: Constant Pain Onset: On-going Patients Stated Pain Goal: 4 Pain Intervention(s): Massage (pain cream applied) ADL ADL ADL Comments: refer to FIM Vision/Perception  Vision- History Baseline Vision/History: Wears glasses;Glaucoma;Macular Degeneration Patient Visual Report: No change from baseline Vision- Assessment Eye Alignment: Within Functional Limits Perception Comments: WFL  Cognition Overall Cognitive Status: Within Functional Limits for tasks assessed Sensation Sensation Light Touch: Appears Intact Stereognosis: Appears Intact  Hot/Cold: Appears Intact Proprioception: Appears Intact Coordination Gross Motor Movements are Fluid and  Coordinated: No Fine Motor Movements are Fluid and Coordinated: Yes Coordination and Movement Description: due to posterior hip precautions, TTWB on L LE, and NWB L UE status Motor  Motor Motor - Skilled Clinical Observations: severe muscle pain and weakness exacerbated by fibromyalgia Mobility    min A with RW transfers Trunk/Postural Assessment  Cervical Assessment Cervical Assessment: Within Functional Limits Thoracic Assessment Thoracic Assessment: Within Functional Limits Lumbar Assessment Lumbar Assessment: Within Functional Limits Postural Control Postural Limitations: decreased postural control due to WBing limitations on LLE/LUE  Balance Static Sitting Balance Static Sitting - Level of Assistance: 6: Modified independent (Device/Increase time) Dynamic Sitting Balance Dynamic Sitting - Level of Assistance: 5: Stand by assistance Static Standing Balance Static Standing - Level of Assistance: 4: Min assist Dynamic Standing Balance Dynamic Standing - Level of Assistance: 4: Min assist Extremity/Trunk Assessment RUE Assessment RUE Assessment: Exceptions to [facility] (limited shoulder active ROM due to fibromyalgia exacerrbation) LUE AROM (degrees) Overall AROM Left Upper Extremity: Deficits;Due to precautions;Due to pain LUE Overall AROM Comments: wrist casted; elbow wfl, shoulder painful from fibromyalgia LUE Strength LUE Overall Strength Comments: At discharge, pt could not tolerate resistance to R shoulder  See FIM for current functional status  Jenna Ramos 10/29/2013, 12:22 PM

## 2013-10-29 NOTE — Patient Care Conference (Signed)
Inpatient RehabilitationTeam Conference and Plan of Care Update Date: 10/29/2013   Time: 10;50 AM    Patient Name: Jenna Ramos      Medical Record Number: [id number]  Date of Birth: Mar 24, 1928 Sex: Female         Room/Bed: 4W04C/4W04C-01 Payor Info: Payor: [place] / Plan: BCBS/F[employer]ER[employer] E[employer] PPO / Product Type: *No Product type* /    Admitting Diagnosis: Hosp-Fem wrist Fem  ORIFS  Admit Date/Time:  10/17/2013  4:23 PM Admission Comments: No comment available   Primary Diagnosis:  Hip fracture requiring operative repair Principal Problem: Hip fracture requiring operative repair  Patient Active Problem List   Diagnosis Date Noted  . 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 Fibromyalgia muscle pain 10/28/2013  . Acute blood loss anemia 10/28/2013  . Hip fracture requiring operative repair 10/17/2013  . Anemia     Expected Discharge Date: Expected Discharge Date: 10/30/13  Team Members Present: Physician leading conference: Dr. Alysia Penna Social Worker Present: Ovidio Kin, LCSW Nurse Present: Heather Roberts, RN PT Present: Georjean Mode, PT;Blair Hobble, PT OT Present: Simonne Come, OT SLP Present: Windell Moulding, SLP PPS Coordinator present : Daiva Nakayama, RN, CRRN     Current Status/Progress Goal Weekly Team Focus  Medical   neck and shoulder pain, flare up of arthritis  pain control  d/c planning   Bowel/Bladder     CONT B& B        Swallow/Nutrition/ Hydration     WFL        ADL's   min-mod A overall  goals downgraded - min A with bathing, supervison with dressing and toileting  ADL retraining, functional mobility training, pt education   Mobility   S/min A transfers and short distance gait x15-20' with PFRW, Min A bed mobility  greatly limited by neck/back pain   Goals downgraded to S basic transfers, gait x25' and WC x25,' min A bed mob   Pain managment, therex for strengthening/ROM, gait and transfers, DC planning    Communication     [facility]        Safety/Cognition/ Behavioral Observations    No unsafe behaviors        Pain     managed adjusting medicines   less than 3 out of 10   managed pain in back and neck  Skin     monitor skin with cast on left arm           *See Care Plan and progress notes for long and short-term goals.  Barriers to Discharge: limited WB, needs 24/7 min/mod A    Possible Resolutions to Barriers:  family to provide care with help of caregiver    Discharge Planning/Teaching Needs:  Home with family and hired caregivers providing 24 hr care-family education today.  Aware of option of NHP if needed.      Team Discussion:  Family education with daughter in-law and aware 24 hr care recommended.  MD adjusted pain meds, trying to manage her pain.  Pain limits pt's participation in therapies.  Pt and family prefer dc tomorrow to be able to see surgeon to remove sutures.  Revisions to Treatment Plan:  Moved up dc per pt and families request, to be able to see surgeon in [place]   Continued Need for Acute Rehabilitation Level of Care: The patient requires daily medical management by a physician with specialized training in physical medicine and rehabilitation for the following conditions: Daily direction  of a multidisciplinary physical rehabilitation program to ensure safe treatment while eliciting the highest outcome that is of practical value to the patient.: Yes Daily medical management of patient stability for increased activity during participation in an intensive rehabilitation regime.: Yes Daily analysis of laboratory values and/or radiology reports with any subsequent need for medication adjustment of medical intervention for : Post surgical problems  Sayer Masini, Gardiner Rhyme 10/29/2013, 2:26 PM

## 2013-10-30 DIAGNOSIS — D649 Anemia, unspecified: Secondary | ICD-10-CM

## 2013-10-30 DIAGNOSIS — S72009A Fracture of unspecified part of neck of unspecified femur, initial encounter for closed fracture: Secondary | ICD-10-CM

## 2013-10-30 MED ORDER — OXYCODONE HCL 5 MG PO TABS: 5.0000 mg | ORAL_TABLET | Freq: Four times a day (QID) | ORAL | Status: DC | PRN

## 2013-10-30 MED ORDER — TRAMADOL HCL 50 MG PO TABS: 50.0000 mg | ORAL_TABLET | Freq: Four times a day (QID) | ORAL | Status: DC | PRN

## 2013-10-30 MED ORDER — MUSCLE RUB 10-15 % EX CREA: 1.0000 "application " | TOPICAL_CREAM | Freq: Three times a day (TID) | CUTANEOUS | Status: DC

## 2013-10-30 MED ORDER — POLYETHYLENE GLYCOL 3350 17 G PO PACK: 17.0000 g | PACK | Freq: Every day | ORAL | Status: DC

## 2013-10-30 MED ORDER — APIXABAN 2.5 MG PO TABS: 2.5000 mg | ORAL_TABLET | Freq: Two times a day (BID) | ORAL | Status: DC

## 2013-10-30 MED ORDER — SENNOSIDES-DOCUSATE SODIUM 8.6-50 MG PO TABS: 1.0000 | ORAL_TABLET | Freq: Every day | ORAL | Status: DC

## 2013-10-30 MED ORDER — GABAPENTIN 100 MG PO CAPS: 200.0000 mg | ORAL_CAPSULE | Freq: Three times a day (TID) | ORAL | Status: DC

## 2013-10-30 MED ORDER — METHOCARBAMOL 750 MG PO TABS: 750.0000 mg | ORAL_TABLET | Freq: Four times a day (QID) | ORAL | Status: DC | PRN

## 2013-10-30 MED ORDER — BENAZEPRIL HCL 10 MG PO TABS: 10.0000 mg | ORAL_TABLET | Freq: Every day | ORAL | Status: DC

## 2013-10-30 MED ORDER — POLYSACCHARIDE IRON COMPLEX 150 MG PO CAPS: 150.0000 mg | ORAL_CAPSULE | Freq: Two times a day (BID) | ORAL | Status: DC

## 2013-10-30 NOTE — Progress Notes (Signed)
10/30/13 1405 nsg Patient discharged to home per wheelchair with daughter in law and NT. Discharge instructions was discussed by PA and understood.

## 2013-10-30 NOTE — Progress Notes (Signed)
10/30/13 1145 nsg Sutures removed on left arm by PA ; incision dry and intact no redness noted ; steri's placed coved with abdominal  pad and splint placed back secured with kerlix and ace wrap. Staples removed by RN  on left hip 2 sites; incision dry and intact no redness noted ; steri's placed and open to air per order Pam Love P.A. Patient tolerated well no complaints.

## 2013-10-30 NOTE — Progress Notes (Signed)
Social Work Discharge Note Discharge Note  The overall goal for the admission was met for:   Discharge location: [place]  Length of Stay: Yes-14 DAYS  Discharge activity level: Yes-SUPERVISION/MIN LEVEL  Home/community participation: Yes  Services provided included: MD, RD, PT, OT, RN, CM, TR, Pharmacy and SW  Financial Services: Medicare and Private Insurance: FED BCBS  Follow-up services arranged: Home Health: [facility]-PT,OT,RN,AIDE, DME: [facility]-WHEELCHAIR, TUB BENCH, PLATFORM ROLLING WALKER, BSC and Patient/Family request agency HH: [facility], DME: [facility]  Comments (or additional information):[name] [place] FEELS COMFORTABLE WITH PT'S CARE.  WILL HAVE 24 HR CARE AT DISCHARGE, AWARE OF HOW PT FLUCUATES ACCORDING TO HER PAIN ISSUES  Patient/Family verbalized understanding of follow-up arrangements: Yes  Individual responsible for coordination of the follow-up plan:   Confirmed correct DME delivered: [name] 10/30/2013    [name]

## 2013-10-30 NOTE — Progress Notes (Signed)
Social Work Patient ID: Jenna Ramos, female   DOB: 1928-09-25, 80 y.o.   MRN: [id number] Met with [name]-daughter in-law who reports can not get into Dr Reece Leader office and he reported to have our staff take The sutures out.  Have spoken with Pam-PA to inform of this information.  She will let RN know about suture removal. Pt to stay until after lunch now since not going to MD in [place].  Make sure pt orders lunch.

## 2013-10-30 NOTE — Progress Notes (Addendum)
80 year old female with history of HTN, RA, fibromyalgia, macular degeneration who caught her toe on the edge of a stair, tripped and fell on her steps on 10/12/13 with onset of severe left hip and left wrist pain and inability to ambulate. She was taken to [facility] ED and workup revealed displaced left femoral neck fracture and distal left radius and ulna fracture. X ray showed question of lytic lesion and NM body scan negative for metastasis. She underwent left hip bipolar hemi and ORIF left wrist by Dr. Derald Macleod on 10/14/13. Post op xrays done revealing small fracture distal to tip of prosthesis on posterior aspect of femur and she was taken to OR for ORIF left periprosthetic fracture on 10/15/13. Post op posterior hip precautions X 2 months and is TTWB LLE X 6 weeks.    Subjective/Complaints:  Neck moving better no radiating arm pain Hip pain ok  Review of Systems - urgency  Objective: Vital Signs: Blood pressure 151/82, pulse 63, temperature 98.6 F (37 C), temperature source Oral, resp. rate 18, height 5\' 3"  (1.6 m), weight 60.011 kg (132 lb 4.8 oz), SpO2 98.00%. No results found. No results found for this or any previous visit (from the past 72 hour(s)).   HEENT: normal Cardio: RRR Resp: CTA B/L and unlabored GI: BS positive and Non distended Extremity:  Pulses positive and No Edema Skin:   Wound C/D/I Neuro: Alert/Oriented and Abnormal Motor Left grip 3-, 2- HF, 3- KE, 4/5 ankle, RUE 5/5 Musc/Skel:  Swelling Left thigh improved. Mild swelling left fingers, Left forearm splint, tenderness to Lt touch R side Anterior shoulder, no tenderness on R side neck but is tight in R trap Sterno costal tenderness  Gen NAD   Assessment/Plan: 1. Functional deficits secondary to Left FNF, and radius fx  Stable for D/C today F/u PCP in 1-2 weeks F/u ortho today for suture removal See D/C summary See D/C instructions FIM - Bathing Bathing Steps Patient Completed: Chest;Right  Arm;Left Arm;Abdomen;Front perineal area;Right upper leg;Left upper leg;Buttocks Bathing: 4: Min-Patient completes 8-9 41f 10 parts or 75+ percent  FIM - Upper Body Dressing/Undressing Upper body dressing/undressing steps patient completed: Thread/unthread right sleeve of pullover shirt/dresss;Thread/unthread left sleeve of pullover shirt/dress;Pull shirt over trunk Upper body dressing/undressing: 4: Min-Patient completed 75 plus % of tasks FIM - Lower Body Dressing/Undressing Lower body dressing/undressing steps patient completed: Thread/unthread right underwear leg;Thread/unthread left underwear leg;Thread/unthread right pants leg;Thread/unthread left pants leg Lower body dressing/undressing: 2: Max-Patient completed 25-49% of tasks  FIM - Toileting Toileting steps completed by patient: Adjust clothing prior to toileting;Performs perineal hygiene Toileting Assistive Devices: Grab bar or rail for support Toileting: 3: Mod-Patient completed 2 of 3 steps  FIM - Radio producer Devices: Insurance account manager Transfers: 4-From toilet/BSC: Min A (steadying Pt. > 75%);4-To toilet/BSC: Min A (steadying Pt. > 75%)  FIM - Bed/Chair Transfer Bed/Chair Transfer Assistive Devices: Copy: 5: Supine > Sit: Supervision (verbal cues/safety issues);5: Sit > Supine: Supervision (verbal cues/safety issues)  FIM - Locomotion: Wheelchair Distance: 50 Locomotion: Wheelchair: 2: Travels 50 - 149 ft with supervision, cueing or coaxing FIM - Locomotion: Ambulation Locomotion: Ambulation Assistive Devices: Engineer, agricultural Ambulation/Gait Assistance: 5: Supervision Locomotion: Ambulation: 1: Travels less than 50 ft with supervision/safety issues  Comprehension Comprehension Mode: Auditory Comprehension: 5-Follows basic conversation/direction: With no assist  Expression Expression Mode: Verbal Expression: 5-Expresses complex 90% of the time/cues < 10% of the  time  Social Interaction Social Interaction: 6-Interacts appropriately  with others with medication or extra time (anti-anxiety, antidepressant).  Problem Solving Problem Solving: 5-Solves complex 90% of the time/cues < 10% of the time  Memory Memory: 6-More than reasonable amt of time   Medical Problem List and Plan:  1. Functional deficits secondary to left femoral neck fracture, left femoral shaft fracture, distal left radius and ulna fracture.  2. DVT Prophylaxis/Anticoagulation: Pharmaceutical: Other (comment)--Eliquis.  3. Pain Management: prn medications effective. History of fibromyalgia she thinks her doctors have tried different fibromyalgia medicines but is not sure which ones, consider gabapentin , Kpad, counter irritant cream   4. Mood: LCSW to follow for evaluation and support.  5. Neuropsych: This patient is capable of making decisions on her own behalf.  6. Skin/Wound Care: Routine pressure relief measures. Maintain adequate nutritional status. Will add nutritional supplement.  7. HTN: Will continue to hold Norvasc. Check orthostatic BP. Monitor BP on tid basis and further adjust medications as indicated. Continue benazepril daily.  8. ABLA: Will recheck labs on Monday. Continue iron supplement.  9. Hyponatremia/Hypokalemia: Resolved 10. UTI Sens to levaquin 11.  Neck pain probable spondylosis- cont topical cream, Kpad, try C2 analgesics monitor sedation/confusion and PT LOS (Days) 13 A FACE TO FACE EVALUATION WAS PERFORMED  KIRSTEINS,ANDREW E 10/30/2013, 8:42 AM

## 2013-11-14 DIAGNOSIS — D62 Acute posthemorrhagic anemia: Secondary | ICD-10-CM

## 2013-11-14 DIAGNOSIS — S72009A Fracture of unspecified part of neck of unspecified femur, initial encounter for closed fracture: Secondary | ICD-10-CM

## 2013-11-14 DIAGNOSIS — D649 Anemia, unspecified: Secondary | ICD-10-CM

## 2013-11-14 DIAGNOSIS — IMO0001 Reserved for inherently not codable concepts without codable children: Secondary | ICD-10-CM

## 2013-11-17 ENCOUNTER — Telehealth: Payer: Self-pay

## 2013-11-17 NOTE — Telephone Encounter (Signed)
May increase PT to 3 days per week

## 2013-11-17 NOTE — Telephone Encounter (Signed)
Notified Magda Paganini.

## 2013-11-17 NOTE — Telephone Encounter (Signed)
Leslie(PT @ [facility]) is requesting a verbal order to increase PT from 2x a week to 3x a week. Magda Paganini reports that patient told her the "MD" recommended that she increases PT visits per week. Please advise.

## 2013-11-27 ENCOUNTER — Encounter (INDEPENDENT_AMBULATORY_CARE_PROVIDER_SITE_OTHER): Payer: BC Managed Care – PPO | Admitting: Ophthalmology

## 2013-11-27 DIAGNOSIS — H35039 Hypertensive retinopathy, unspecified eye: Secondary | ICD-10-CM | POA: Diagnosis not present

## 2013-11-27 DIAGNOSIS — H353290 Exudative age-related macular degeneration, unspecified eye, stage unspecified: Secondary | ICD-10-CM

## 2013-11-27 DIAGNOSIS — I10 Essential (primary) hypertension: Secondary | ICD-10-CM | POA: Diagnosis not present

## 2013-11-27 DIAGNOSIS — H43819 Vitreous degeneration, unspecified eye: Secondary | ICD-10-CM

## 2013-12-23 ENCOUNTER — Encounter (INDEPENDENT_AMBULATORY_CARE_PROVIDER_SITE_OTHER): Payer: BC Managed Care – PPO | Admitting: Ophthalmology

## 2013-12-23 DIAGNOSIS — H43813 Vitreous degeneration, bilateral: Secondary | ICD-10-CM

## 2013-12-23 DIAGNOSIS — I10 Essential (primary) hypertension: Secondary | ICD-10-CM

## 2013-12-23 DIAGNOSIS — H3532 Exudative age-related macular degeneration: Secondary | ICD-10-CM

## 2013-12-23 DIAGNOSIS — H35033 Hypertensive retinopathy, bilateral: Secondary | ICD-10-CM | POA: Diagnosis not present

## 2013-12-25 ENCOUNTER — Telehealth: Payer: Self-pay | Admitting: Family Medicine

## 2013-12-25 NOTE — Telephone Encounter (Signed)
Appointment scheduled for 1/6 with [facility]

## 2013-12-27 ENCOUNTER — Other Ambulatory Visit: Payer: Self-pay | Admitting: Physical Medicine and Rehabilitation

## 2014-01-06 ENCOUNTER — Emergency Department (HOSPITAL_COMMUNITY): Payer: Federal, State, Local not specified - PPO

## 2014-01-06 ENCOUNTER — Emergency Department (HOSPITAL_COMMUNITY)
Admission: EM | Admit: 2014-01-06 | Discharge: 2014-01-06 | Disposition: A | Discharge status: Home / Self Care | Payer: Federal, State, Local not specified - PPO | Attending: Emergency Medicine | Admitting: Emergency Medicine

## 2014-01-06 ENCOUNTER — Encounter (HOSPITAL_COMMUNITY): Payer: Self-pay

## 2014-01-06 DIAGNOSIS — H409 Unspecified glaucoma: Secondary | ICD-10-CM | POA: Diagnosis not present

## 2014-01-06 DIAGNOSIS — Z8739 Personal history of other diseases of the musculoskeletal system and connective tissue: Secondary | ICD-10-CM | POA: Diagnosis not present

## 2014-01-06 DIAGNOSIS — R6883 Chills (without fever): Secondary | ICD-10-CM

## 2014-01-06 DIAGNOSIS — Z79899 Other long term (current) drug therapy: Secondary | ICD-10-CM | POA: Insufficient documentation

## 2014-01-06 DIAGNOSIS — Z792 Long term (current) use of antibiotics: Secondary | ICD-10-CM | POA: Insufficient documentation

## 2014-01-06 DIAGNOSIS — N390 Urinary tract infection, site not specified: Secondary | ICD-10-CM | POA: Insufficient documentation

## 2014-01-06 DIAGNOSIS — Z7902 Long term (current) use of antithrombotics/antiplatelets: Secondary | ICD-10-CM | POA: Insufficient documentation

## 2014-01-06 DIAGNOSIS — I10 Essential (primary) hypertension: Secondary | ICD-10-CM | POA: Diagnosis not present

## 2014-01-06 DIAGNOSIS — E039 Hypothyroidism, unspecified: Secondary | ICD-10-CM | POA: Diagnosis not present

## 2014-01-06 DIAGNOSIS — R51 Headache: Secondary | ICD-10-CM | POA: Insufficient documentation

## 2014-01-06 DIAGNOSIS — R519 Headache, unspecified: Secondary | ICD-10-CM

## 2014-01-06 DIAGNOSIS — D649 Anemia, unspecified: Secondary | ICD-10-CM | POA: Insufficient documentation

## 2014-01-06 DIAGNOSIS — H9190 Unspecified hearing loss, unspecified ear: Secondary | ICD-10-CM | POA: Insufficient documentation

## 2014-01-06 LAB — CBC WITH DIFFERENTIAL/PLATELET
BASOS ABS: 0.1 10*3/uL (ref 0.0–0.1)
Basophils Relative: 1 % (ref 0–1)
EOS PCT: 1 % (ref 0–5)
Eosinophils Absolute: 0.1 10*3/uL (ref 0.0–0.7)
HCT: 36 % (ref 36.0–46.0)
Hemoglobin: 11.5 g/dL — ABNORMAL LOW (ref 12.0–15.0)
LYMPHS PCT: 14 % (ref 12–46)
Lymphs Abs: 1.5 10*3/uL (ref 0.7–4.0)
MCH: 23 pg — ABNORMAL LOW (ref 26.0–34.0)
MCHC: 31.9 g/dL (ref 30.0–36.0)
MCV: 72 fL — ABNORMAL LOW (ref 78.0–100.0)
Monocytes Absolute: 0.7 10*3/uL (ref 0.1–1.0)
Monocytes Relative: 6 % (ref 3–12)
Neutro Abs: 8.4 10*3/uL — ABNORMAL HIGH (ref 1.7–7.7)
Neutrophils Relative %: 78 % — ABNORMAL HIGH (ref 43–77)
PLATELETS: 439 10*3/uL — AB (ref 150–400)
RBC: 5 MIL/uL (ref 3.87–5.11)
RDW: 18 % — AB (ref 11.5–15.5)
WBC: 10.7 10*3/uL — AB (ref 4.0–10.5)

## 2014-01-06 LAB — URINE MICROSCOPIC-ADD ON

## 2014-01-06 LAB — COMPREHENSIVE METABOLIC PANEL
ALT: 12 U/L (ref 0–35)
AST: 15 U/L (ref 0–37)
Albumin: 3.8 g/dL (ref 3.5–5.2)
Alkaline Phosphatase: 91 U/L (ref 39–117)
Anion gap: 15 (ref 5–15)
BUN: 21 mg/dL (ref 6–23)
CALCIUM: 9.7 mg/dL (ref 8.4–10.5)
CHLORIDE: 96 meq/L (ref 96–112)
CO2: 26 mEq/L (ref 19–32)
Creatinine, Ser: 0.56 mg/dL (ref 0.50–1.10)
GFR calc Af Amer: >90 mL/min (ref >90)
GFR, EST NON AFRICAN AMERICAN: 84 mL/min — AB (ref >90)
GLUCOSE: 169 mg/dL — AB (ref 70–99)
Potassium: 3.7 mEq/L (ref 3.7–5.3)
Sodium: 137 mEq/L (ref 137–147)
Total Bilirubin: 0.3 mg/dL (ref 0.3–1.2)
Total Protein: 8 g/dL (ref 6.0–8.3)

## 2014-01-06 LAB — URINALYSIS, ROUTINE W REFLEX MICROSCOPIC
Bilirubin Urine: NEGATIVE
Glucose, UA: NEGATIVE mg/dL
Ketones, ur: NEGATIVE mg/dL
Nitrite: NEGATIVE
PH: 7.5 (ref 5.0–8.0)
Protein, ur: 30 mg/dL — AB
SPECIFIC GRAVITY, URINE: 1.012 (ref 1.005–1.030)
Urobilinogen, UA: 0.2 mg/dL (ref 0.0–1.0)

## 2014-01-06 LAB — I-STAT CG4 LACTIC ACID, ED: Lactic Acid, Venous: 1.64 mmol/L (ref 0.5–2.2)

## 2014-01-06 LAB — PROTIME-INR
INR: 1.1 (ref 0.00–1.49)
Prothrombin Time: 14.3 seconds (ref 11.6–15.2)

## 2014-01-06 LAB — SEDIMENTATION RATE: Sed Rate: 25 mm/hr — ABNORMAL HIGH (ref 0–22)

## 2014-01-06 LAB — LIPASE, BLOOD: Lipase: 25 U/L (ref 11–59)

## 2014-01-06 MED ORDER — PROCHLORPERAZINE EDISYLATE 5 MG/ML IJ SOLN
10.0000 mg | Freq: Once | INTRAMUSCULAR | Status: AC
  Administered 2014-01-06: 10 mg via INTRAVENOUS
  Filled 2014-01-06: qty 2

## 2014-01-06 MED ORDER — FENTANYL CITRATE 0.05 MG/ML IJ SOLN
50.0000 ug | Freq: Once | INTRAMUSCULAR | Status: AC
  Administered 2014-01-06: 50 ug via INTRAVENOUS
  Filled 2014-01-06: qty 2

## 2014-01-06 MED ORDER — SODIUM CHLORIDE 0.9 % IV SOLN
INTRAVENOUS | Status: DC
  Administered 2014-01-06: 19:00:00 via INTRAVENOUS

## 2014-01-06 MED ORDER — DEXTROSE 5 % IV SOLN
1.0000 g | Freq: Once | INTRAVENOUS | Status: AC
  Administered 2014-01-06: 1 g via INTRAVENOUS
  Filled 2014-01-06: qty 10

## 2014-01-06 MED ORDER — HYDROCODONE-ACETAMINOPHEN 5-325 MG PO TABS: 1.0000 | ORAL_TABLET | ORAL | Status: DC | PRN

## 2014-01-06 MED ORDER — CEPHALEXIN 500 MG PO CAPS: 500.0000 mg | ORAL_CAPSULE | Freq: Three times a day (TID) | ORAL | Status: DC

## 2014-01-06 NOTE — ED Notes (Addendum)
Pt here for body aches, headache and chills since yesterday afternoon. Also vomiting. No diarrhea. Pt does state she received a flu shot a week ago. Has not been running a fever, only having chills.

## 2014-01-06 NOTE — ED Provider Notes (Signed)
CSN: [id number]     Arrival date & time 01/06/14  1745 History   First MD Initiated Contact with Patient 01/06/14 1801     Chief Complaint  Patient presents with  . Headache  . Chills  . Generalized Body Aches   HPI Pt was recently in the hospital for TIA symptoms at [facility].  She was released yesterday after being evaluated.  No evidence of stroke although she was given aggrenox and developed a headache.  She has had a severe headache while in the hospital.  It has not gotten much better.  She is going to follow up for further evaluation of carotid stenosis.  The headache persists  in the right temporal area.  She had some vomiting while in the hospital but none today.  No diarrhea.  No dysuria.  No abdominal pain or chest pain.  She has felt chilled. Past Medical History  Diagnosis Date  . Rectal incontinence   . Hypertension   . Arthritis   . Hypothyroidism   . Anemia   . Fibromyalgia   . Glaucoma   . Macular degeneration   . HOH (hard of hearing)    Past Surgical History  Procedure Laterality Date  . Tubal ligation     Family History  Problem Relation Age of Onset  . Prostate cancer Father   . Lung disease Sister    History  Substance Use Topics  . Smoking status: Never Smoker   . Smokeless tobacco: Not on file  . Alcohol Use: No   OB History    No data available     Review of Systems  All other systems reviewed and are negative.     Allergies  Codeine and Morphine and related  Home Medications   Prior to Admission medications   Medication Sig Start Date End Date Taking? Authorizing Provider  benazepril (LOTENSIN) 10 MG tablet Take 1 tablet (10 mg total) by mouth daily. 10/30/13  Yes Ivan Anchors Love, PA-C  bimatoprost (LUMIGAN) 0.03 % ophthalmic solution Place 1 drop into both eyes at bedtime.   Yes Historical Provider, MD  Calcium Carbonate-Vitamin D (CALCIUM 600/VITAMIN D) 600-400 MG-UNIT per tablet Take 1 tablet by mouth daily.   Yes Historical Provider,  MD  gabapentin (NEURONTIN) 100 MG capsule Take 2 capsules (200 mg total) by mouth 3 (three) times daily. For fibromyalgia 10/30/13  Yes Ivan Anchors Love, PA-C  iron polysaccharides (NIFEREX) 150 MG capsule Take 1 capsule (150 mg total) by mouth 2 (two) times daily before lunch and supper. 10/30/13  Yes Ivan Anchors Love, PA-C  levothyroxine (SYNTHROID, LEVOTHROID) 88 MCG tablet Take 44-88 mcg by mouth daily. 1 daily Mon-Fri and 1/2 tab on Sat-Sun   Yes Historical Provider, MD  Multiple Vitamins-Minerals (MULTIVITAMINS THER. W/MINERALS) TABS Take 1 tablet by mouth daily.   Yes Historical Provider, MD  Multiple Vitamins-Minerals (PRESERVISION AREDS 2 PO) Take 1 capsule by mouth 2 (two) times daily.   Yes Historical Provider, MD  Omega-3 Fatty Acids (FISH OIL) 1200 MG CAPS Take 1 capsule by mouth 3 (three) times daily with meals.   Yes Historical Provider, MD  oxyCODONE (OXY IR/ROXICODONE) 5 MG immediate release tablet Take 1 tablet (5 mg total) by mouth every 6 (six) hours as needed for severe pain. 10/30/13  Yes Ivan Anchors Love, PA-C  apixaban (ELIQUIS) 2.5 MG TABS tablet Take 1 tablet (2.5 mg total) by mouth 2 (two) times daily. 10/30/13   Ivan Anchors Love, PA-C  atorvastatin (LIPITOR) 40 MG  tablet Take 40 mg by mouth daily.    Historical Provider, MD  cephALEXin (KEFLEX) 500 MG capsule Take 1 capsule (500 mg total) by mouth 3 (three) times daily. 01/06/14   Dorie Rank, MD  FLUZONE HIGH-DOSE 0.5 ML SUSY Inject 1 application into the muscle once. 12/27/13   Historical Provider, MD  HYDROcodone-acetaminophen (NORCO/VICODIN) 5-325 MG per tablet Take 1-2 tablets by mouth every 4 (four) hours as needed. 01/06/14   Dorie Rank, MD  Menthol-Methyl Salicylate (MUSCLE RUB) 10-15 % CREA Apply 1 application topically 3 (three) times daily. To shoulders/neck 10/30/13   Ivan Anchors Love, PA-C  methocarbamol (ROBAXIN) 750 MG tablet Take 1 tablet (750 mg total) by mouth every 6 (six) hours as needed for muscle spasms. 10/30/13   Ivan Anchors  Love, PA-C  polyethylene glycol (MIRALAX / GLYCOLAX) packet Take 17 g by mouth daily. 10/30/13   Ivan Anchors Love, PA-C  senna-docusate (SENOKOT-S) 8.6-50 MG per tablet Take 1 tablet by mouth at bedtime. 10/30/13   Bary Leriche, PA-C  traMADol (ULTRAM) 50 MG tablet Take 1 tablet (50 mg total) by mouth every 6 (six) hours as needed for moderate pain. 10/30/13   Ivan Anchors Love, PA-C   BP 185/74 mmHg  Pulse 81  Temp(Src) 99.1 F (37.3 C) (Oral)  Resp 18  Ht 5\' 4"  (1.626 m)  Wt 132 lb (59.875 kg)  BMI 22.65 kg/m2  SpO2 93% Physical Exam  Constitutional: She appears well-developed and well-nourished. No distress.  HENT:  Head: Normocephalic and atraumatic.  Right Ear: External ear normal.  Left Ear: External ear normal.  ttp right temporal area  Eyes: Conjunctivae are normal. Right eye exhibits no discharge. Left eye exhibits no discharge. No scleral icterus.  Neck: Neck supple. No tracheal deviation present.  Cardiovascular: Normal rate, regular rhythm and intact distal pulses.   Pulmonary/Chest: Effort normal and breath sounds normal. No stridor. No respiratory distress. She has no wheezes. She has no rales.  Abdominal: Soft. Bowel sounds are normal. She exhibits no distension. There is no tenderness. There is no rebound and no guarding.  Mild ttp epigastrum  Musculoskeletal: She exhibits no edema or tenderness.  Neurological: She is alert. She has normal strength. No cranial nerve deficit (no facial droop, extraocular movements intact, no slurred speech) or sensory deficit. She exhibits normal muscle tone. She displays no seizure activity. Coordination normal.  Equal grip and plantar flexion strength bilaterally, 5/5, normal sensation, nl speech   Skin: Skin is warm and dry. No rash noted.  Psychiatric: She has a normal mood and affect.  Nursing note and vitals reviewed.   ED Course  Procedures (including critical care time) Labs Review Labs Reviewed  CBC WITH DIFFERENTIAL - Abnormal;  Notable for the following:    WBC 10.7 (*)    Hemoglobin 11.5 (*)    MCV 72.0 (*)    MCH 23.0 (*)    RDW 18.0 (*)    Platelets 439 (*)    Neutrophils Relative % 78 (*)    Neutro Abs 8.4 (*)    All other components within normal limits  COMPREHENSIVE METABOLIC PANEL - Abnormal; Notable for the following:    Glucose, Bld 169 (*)    GFR calc non Af Amer 84 (*)    All other components within normal limits  URINALYSIS, ROUTINE W REFLEX MICROSCOPIC - Abnormal; Notable for the following:    APPearance HAZY (*)    Hgb urine dipstick TRACE (*)    Protein, ur 30 (*)  Leukocytes, UA TRACE (*)    All other components within normal limits  SEDIMENTATION RATE - Abnormal; Notable for the following:    Sed Rate 25 (*)    All other components within normal limits  URINE MICROSCOPIC-ADD ON - Abnormal; Notable for the following:    Bacteria, UA MANY (*)    All other components within normal limits  CULTURE, BLOOD (ROUTINE X 2)  CULTURE, BLOOD (ROUTINE X 2)  URINE CULTURE  PROTIME-INR  LIPASE, BLOOD  I-STAT CG4 LACTIC ACID, ED    Imaging Review Dg Chest 2 View  01/06/2014   CLINICAL DATA:  Initial encounter. Chills. Headache. Body aches for 1 day.  EXAM: CHEST  2 VIEW  COMPARISON:  10/12/2013.  FINDINGS: Cardiopericardial silhouette within normal limits. Mediastinal contours normal. Trachea midline. No airspace disease or effusion. Monitoring leads project over the chest. Aortic arch atherosclerosis. Mild basilar atelectasis. Lung volumes are lower than on the prior examination.  IMPRESSION: Suboptimal inspiration with basilar atelectasis.   Electronically Signed   By: Dereck Ligas M.D.   On: 01/06/2014 19:53   Ct Head Wo Contrast  01/06/2014   CLINICAL DATA:  80 year old female with headache, chills and vomiting since yesterday afternoon.  EXAM: CT HEAD WITHOUT CONTRAST  TECHNIQUE: Contiguous axial images were obtained from the base of the skull through the vertex without intravenous  contrast.  COMPARISON:  Head CT 01/03/2014.  FINDINGS: Mild cerebral and cerebellar atrophy. Patchy and confluent areas of decreased attenuation are noted throughout the deep and periventricular white matter of the cerebral hemispheres bilaterally, compatible with chronic microvascular ischemic disease. No acute intracranial abnormalities. Specifically, no evidence of acute intracranial hemorrhage, no definite findings of acute/subacute cerebral ischemia, no mass, mass effect, hydrocephalus or abnormal intra or extra-axial fluid collections. Visualized paranasal sinuses and mastoids are well pneumatized. No acute displaced skull fractures are identified.  IMPRESSION: 1. No acute intracranial abnormalities. 2. Mild cerebral and cerebellar atrophy with extensive chronic microvascular ischemic changes in the cerebral white matter.   Electronically Signed   By: Vinnie Langton M.D.   On: 01/06/2014 20:12    Medications  0.9 %  sodium chloride infusion ( Intravenous New Bag/Given 01/06/14 1845)  fentaNYL (SUBLIMAZE) injection 50 mcg (50 mcg Intravenous Given 01/06/14 1851)  prochlorperazine (COMPAZINE) injection 10 mg (10 mg Intravenous Given 01/06/14 1847)  cefTRIAXone (ROCEPHIN) 1 g in dextrose 5 % 50 mL IVPB (0 g Intravenous Stopped 01/06/14 2107)     MDM   Final diagnoses:  Headache  UTI (lower urinary tract infection)    According to the family the patient had an extensive workup at another medical facility that included CT scan as well as MRI.  Patient's imaging tests do not show any acute abnormality today. She is not having any neck pain or stiffness I doubt meningitis.  Sedimentation rate is mildly elevated but not significantly so. This argues against temporal arteritis.  Patient's symptoms improved with treatment. She has some generalized body aches and chills. Urinalysis does suggest urinary tract infection. This point I think she is stable for discharge with outpatient follow-up and a  prescription for oral antibiotics    Dorie Rank, MD 01/06/14 2213

## 2014-01-06 NOTE — ED Notes (Signed)
Patient transported to X-ray 

## 2014-01-06 NOTE — ED Notes (Signed)
Patient back from imaging

## 2014-01-06 NOTE — Discharge Instructions (Signed)
Headaches, Frequently Asked Questions MIGRAINE HEADACHES Q: What is migraine? What causes it? How can I treat it? A: Generally, migraine headaches begin as a dull ache. Then they develop into a constant, throbbing, and pulsating pain. You may experience pain at the temples. You may experience pain at the front or back of one or both sides of the head. The pain is usually accompanied by a combination of:  Nausea.  Vomiting.  Sensitivity to light and noise. Some people (about 15%) experience an aura (see below) before an attack. The cause of migraine is believed to be chemical reactions in the brain. Treatment for migraine may include over-the-counter or prescription medications. It may also include self-help techniques. These include relaxation training and biofeedback.  Q: What is an aura? A: About 15% of people with migraine get an "aura". This is a sign of neurological symptoms that occur before a migraine headache. You may see wavy or jagged lines, dots, or flashing lights. You might experience tunnel vision or blind spots in one or both eyes. The aura can include visual or auditory hallucinations (something imagined). It may include disruptions in smell (such as strange odors), taste or touch. Other symptoms include:  Numbness.  A "pins and needles" sensation.  Difficulty in recalling or speaking the correct word. These neurological events may last as long as 60 minutes. These symptoms will fade as the headache begins. Q: What is a trigger? A: Certain physical or environmental factors can lead to or "trigger" a migraine. These include:  Foods.  Hormonal changes.  Weather.  Stress. It is important to remember that triggers are different for everyone. To help prevent migraine attacks, you need to figure out which triggers affect you. Keep a headache diary. This is a good way to track triggers. The diary will help you talk to your healthcare professional about your condition. Q: Does  weather affect migraines? A: Bright sunshine, hot, humid conditions, and drastic changes in barometric pressure may lead to, or "trigger," a migraine attack in some people. But studies have shown that weather does not act as a trigger for everyone with migraines. Q: What is the link between migraine and hormones? A: Hormones start and regulate many of your body's functions. Hormones keep your body in balance within a constantly changing environment. The levels of hormones in your body are unbalanced at times. Examples are during menstruation, pregnancy, or menopause. That can lead to a migraine attack. In fact, about three quarters of all women with migraine report that their attacks are related to the menstrual cycle.  Q: Is there an increased risk of stroke for migraine sufferers? A: The likelihood of a migraine attack causing a stroke is very remote. That is not to say that migraine sufferers cannot have a stroke associated with their migraines. In persons under age 53, the most common associated factor for stroke is migraine headache. But over the course of a person's normal life span, the occurrence of migraine headache may actually be associated with a reduced risk of dying from cerebrovascular disease due to stroke.  Q: What are acute medications for migraine? A: Acute medications are used to treat the pain of the headache after it has started. Examples over-the-counter medications, NSAIDs, ergots, and triptans.  Q: What are the triptans? A: Triptans are the newest class of abortive medications. They are specifically targeted to treat migraine. Triptans are vasoconstrictors. They moderate some chemical reactions in the brain. The triptans work on receptors in your brain. Triptans help  to restore the balance of a neurotransmitter called serotonin. Fluctuations in levels of serotonin are thought to be a main cause of migraine.  °Q: Are over-the-counter medications for migraine effective? °A:  Over-the-counter, or "OTC," medications may be effective in relieving mild to moderate pain and associated symptoms of migraine. But you should see your caregiver before beginning any treatment regimen for migraine.  °Q: What are preventive medications for migraine? °A: Preventive medications for migraine are sometimes referred to as "prophylactic" treatments. They are used to reduce the frequency, severity, and length of migraine attacks. Examples of preventive medications include antiepileptic medications, antidepressants, beta-blockers, calcium channel blockers, and NSAIDs (nonsteroidal anti-inflammatory drugs). °Q: Why are anticonvulsants used to treat migraine? °A: During the past few years, there has been an increased interest in antiepileptic drugs for the prevention of migraine. They are sometimes referred to as "anticonvulsants". Both epilepsy and migraine may be caused by similar reactions in the brain.  °Q: Why are antidepressants used to treat migraine? °A: Antidepressants are typically used to treat people with depression. They may reduce migraine frequency by regulating chemical levels, such as serotonin, in the brain.  °Q: What alternative therapies are used to treat migraine? °A: The term "alternative therapies" is often used to describe treatments considered outside the scope of conventional Western medicine. Examples of alternative therapy include acupuncture, acupressure, and yoga. Another common alternative treatment is herbal therapy. Some herbs are believed to relieve headache pain. Always discuss alternative therapies with your caregiver before proceeding. Some herbal products contain arsenic and other toxins. °TENSION HEADACHES °Q: What is a tension-type headache? What causes it? How can I treat it? °A: Tension-type headaches occur randomly. They are often the result of temporary stress, anxiety, fatigue, or anger. Symptoms include soreness in your temples, a tightening band-like sensation  around your head (a "vice-like" ache). Symptoms can also include a pulling feeling, pressure sensations, and contracting head and neck muscles. The headache begins in your forehead, temples, or the back of your head and neck. Treatment for tension-type headache may include over-the-counter or prescription medications. Treatment may also include self-help techniques such as relaxation training and biofeedback. °CLUSTER HEADACHES °Q: What is a cluster headache? What causes it? How can I treat it? °A: Cluster headache gets its name because the attacks come in groups. The pain arrives with little, if any, warning. It is usually on one side of the head. A tearing or bloodshot eye and a runny nose on the same side of the headache may also accompany the pain. Cluster headaches are believed to be caused by chemical reactions in the brain. They have been described as the most severe and intense of any headache type. Treatment for cluster headache includes prescription medication and oxygen. °SINUS HEADACHES °Q: What is a sinus headache? What causes it? How can I treat it? °A: When a cavity in the bones of the face and skull (a sinus) becomes inflamed, the inflammation will cause localized pain. This condition is usually the result of an allergic reaction, a tumor, or an infection. If your headache is caused by a sinus blockage, such as an infection, you will probably have a fever. An x-ray will confirm a sinus blockage. Your caregiver's treatment might include antibiotics for the infection, as well as antihistamines or decongestants.  °REBOUND HEADACHES °Q: What is a rebound headache? What causes it? How can I treat it? °A: A pattern of taking acute headache medications too often can lead to a condition known as "rebound headache."   A pattern of taking too much headache medication includes taking it more than 2 days per week or in excessive amounts. That means more than the label or a caregiver advises. With rebound  headaches, your medications not only stop relieving pain, they actually begin to cause headaches. Doctors treat rebound headache by tapering the medication that is being overused. Sometimes your caregiver will gradually substitute a different type of treatment or medication. Stopping may be a challenge. Regularly overusing a medication increases the potential for serious side effects. Consult a caregiver if you regularly use headache medications more than 2 days per week or more than the label advises. °ADDITIONAL QUESTIONS AND ANSWERS °Q: What is biofeedback? °A: Biofeedback is a self-help treatment. Biofeedback uses special equipment to monitor your body's involuntary physical responses. Biofeedback monitors: °· Breathing. °· Pulse. °· Heart rate. °· Temperature. °· Muscle tension. °· Brain activity. °Biofeedback helps you refine and perfect your relaxation exercises. You learn to control the physical responses that are related to stress. Once the technique has been mastered, you do not need the equipment any more. °Q: Are headaches hereditary? °A: Four out of five (80%) of people that suffer report a family history of migraine. Scientists are not sure if this is genetic or a family predisposition. Despite the uncertainty, a child has a 50% chance of having migraine if one parent suffers. The child has a 75% chance if both parents suffer.  °Q: Can children get headaches? °A: By the time they reach high school, most young people have experienced some type of headache. Many safe and effective approaches or medications can prevent a headache from occurring or stop it after it has begun.  °Q: What type of doctor should I see to diagnose and treat my headache? °A: Start with your primary caregiver. Discuss his or her experience and approach to headaches. Discuss methods of classification, diagnosis, and treatment. Your caregiver may decide to recommend you to a headache specialist, depending upon your symptoms or other  physical conditions. Having diabetes, allergies, etc., may require a more comprehensive and inclusive approach to your headache. The [employer] will provide, upon request, a list of NHF physician members in your state. °Document Released: 05/13/2003 Document Revised: 05/15/2011 Document Reviewed: 10/21/2007 °ExitCare® Patient Information ©2015 [employer]. This information is not intended to replace advice given to you by your health care provider. Make sure you discuss any questions you have with your health care provider. ° °Urinary Tract Infection °Urinary tract infections (UTIs) can develop anywhere along your urinary tract. Your urinary tract is your body's drainage system for removing wastes and extra water. Your urinary tract includes two kidneys, two ureters, a bladder, and a urethra. Your kidneys are a pair of bean-shaped organs. Each kidney is about the size of your fist. They are located below your ribs, one on each side of your spine. °CAUSES °Infections are caused by microbes, which are microscopic organisms, including fungi, viruses, and bacteria. These organisms are so small that they can only be seen through a microscope. Bacteria are the microbes that most commonly cause UTIs. °SYMPTOMS  °Symptoms of UTIs may vary by age and gender of the patient and by the location of the infection. Symptoms in young women typically include a frequent and intense urge to urinate and a painful, burning feeling in the bladder or urethra during urination. Older women and men are more likely to be tired, shaky, and weak and have muscle aches and abdominal pain. A fever may   mean the infection is in your kidneys. Other symptoms of a kidney infection include pain in your back or sides below the ribs, nausea, and vomiting. °DIAGNOSIS °To diagnose a UTI, your caregiver will ask you about your symptoms. Your caregiver also will ask to provide a urine sample. The urine sample will be tested for bacteria  and white blood cells. White blood cells are made by your body to help fight infection. °TREATMENT  °Typically, UTIs can be treated with medication. Because most UTIs are caused by a bacterial infection, they usually can be treated with the use of antibiotics. The choice of antibiotic and length of treatment depend on your symptoms and the type of bacteria causing your infection. °HOME CARE INSTRUCTIONS °· If you were prescribed antibiotics, take them exactly as your caregiver instructs you. Finish the medication even if you feel better after you have only taken some of the medication. °· Drink enough water and fluids to keep your urine clear or pale yellow. °· Avoid caffeine, tea, and carbonated beverages. They tend to irritate your bladder. °· Empty your bladder often. Avoid holding urine for long periods of time. °· Empty your bladder before and after sexual intercourse. °· After a bowel movement, women should cleanse from front to back. Use each tissue only once. °SEEK MEDICAL CARE IF:  °· You have back pain. °· You develop a fever. °· Your symptoms do not begin to resolve within 3 days. °SEEK IMMEDIATE MEDICAL CARE IF:  °· You have severe back pain or lower abdominal pain. °· You develop chills. °· You have nausea or vomiting. °· You have continued burning or discomfort with urination. °MAKE SURE YOU:  °· Understand these instructions. °· Will watch your condition. °· Will get help right away if you are not doing well or get worse. °Document Released: 11/30/2004 Document Revised: 08/22/2011 Document Reviewed: 03/31/2011 °ExitCare® Patient Information ©2015 [employer]. This information is not intended to replace advice given to you by your health care provider. Make sure you discuss any questions you have with your health care provider. ° °

## 2014-01-07 ENCOUNTER — Encounter: Payer: Self-pay | Admitting: Vascular Surgery

## 2014-01-07 LAB — URINE CULTURE

## 2014-01-12 LAB — CULTURE, BLOOD (ROUTINE X 2)
CULTURE: NO GROWTH
CULTURE: NO GROWTH

## 2014-01-13 ENCOUNTER — Encounter: Payer: Self-pay | Admitting: Vascular Surgery

## 2014-01-14 ENCOUNTER — Encounter: Payer: Self-pay | Admitting: Vascular Surgery

## 2014-01-14 ENCOUNTER — Ambulatory Visit (INDEPENDENT_AMBULATORY_CARE_PROVIDER_SITE_OTHER): Payer: Medicare Other | Admitting: Vascular Surgery

## 2014-01-14 VITALS — BP 164/61 | HR 70 | Ht 64.0 in | Wt 132.0 lb

## 2014-01-14 DIAGNOSIS — I779 Disorder of arteries and arterioles, unspecified: Secondary | ICD-10-CM | POA: Insufficient documentation

## 2014-01-14 DIAGNOSIS — I739 Peripheral vascular disease, unspecified: Secondary | ICD-10-CM

## 2014-01-14 DIAGNOSIS — I6529 Occlusion and stenosis of unspecified carotid artery: Secondary | ICD-10-CM | POA: Insufficient documentation

## 2014-01-14 DIAGNOSIS — I6523 Occlusion and stenosis of bilateral carotid arteries: Secondary | ICD-10-CM

## 2014-01-14 DIAGNOSIS — Z01818 Encounter for other preprocedural examination: Secondary | ICD-10-CM

## 2014-01-14 NOTE — Progress Notes (Addendum)
New Carotid Patient  Referred by:  Cleda Mccreedy, MD [address] [place], [place] [address]  Reason for referral: TIA  History of Present Illness  Jenna Ramos is a 80 y.o. (May 24, 1928) female who presents with chief complaint: recent TIA.  Previous carotid studies demonstrated: RICA <83 stenosis, LICA 15-17% stenosis.  The patient was recently admitted locally for presumed TIA with receptive aphasia.  The patient previously has had an episode of sensation of "head floating away" with inability to write for several days.  This resolved after a few days.  The patient has never had amaurosis fugax or monocular blindness.  The patient has never had facial drooping or hemiplegia.  The patient has never had expressive aphasia.   The patient's previous neurologic deficits have resolved.  The patient's risks factors for carotid disease include: hyperlipidemia, HTN.  Past Medical History  Diagnosis Date  . Rectal incontinence   . Hypertension   . Arthritis   . Hypothyroidism   . Anemia   . Fibromyalgia   . Glaucoma   . Macular degeneration   . HOH (hard of hearing)   . Stroke     Past Surgical History  Procedure Laterality Date  . Tubal ligation    . Appendectomy      History   Social History  . Marital Status: Widowed    Spouse Name: N/A    Number of Children: N/A  . Years of Education: N/A   Occupational History  . Not on file.   Social History Main Topics  . Smoking status: Never Smoker   . Smokeless tobacco: Not on file  . Alcohol Use: No  . Drug Use: No  . Sexual Activity: Not on file   Other Topics Concern  . Not on file   Social History Narrative    Family History  Problem Relation Age of Onset  . Prostate cancer Father   . Cancer Father   . Lung disease Sister   . Cancer Brother    Current Outpatient Prescriptions on File Prior to Visit  Medication Sig Dispense Refill  . apixaban (ELIQUIS) 2.5 MG TABS tablet Take 1 tablet (2.5 mg total)  by mouth 2 (two) times daily. 60 tablet 0  . atorvastatin (LIPITOR) 40 MG tablet Take 40 mg by mouth daily.    . benazepril (LOTENSIN) 10 MG tablet Take 1 tablet (10 mg total) by mouth daily. 30 tablet 1  . bimatoprost (LUMIGAN) 0.03 % ophthalmic solution Place 1 drop into both eyes at bedtime.    . Calcium Carbonate-Vitamin D (CALCIUM 600/VITAMIN D) 600-400 MG-UNIT per tablet Take 1 tablet by mouth daily.    . cephALEXin (KEFLEX) 500 MG capsule Take 1 capsule (500 mg total) by mouth 3 (three) times daily. 21 capsule 0  . FLUZONE HIGH-DOSE 0.5 ML SUSY Inject 1 application into the muscle once.    . gabapentin (NEURONTIN) 100 MG capsule Take 2 capsules (200 mg total) by mouth 3 (three) times daily. For fibromyalgia 180 capsule 1  . HYDROcodone-acetaminophen (NORCO/VICODIN) 5-325 MG per tablet Take 1-2 tablets by mouth every 4 (four) hours as needed. 16 tablet 0  . iron polysaccharides (NIFEREX) 150 MG capsule Take 1 capsule (150 mg total) by mouth 2 (two) times daily before lunch and supper. 60 capsule 1  . levothyroxine (SYNTHROID, LEVOTHROID) 88 MCG tablet Take 44-88 mcg by mouth daily. 1 daily Mon-Fri and 1/2 tab on Sat-Sun    . Menthol-Methyl Salicylate (MUSCLE RUB) 10-15 %  CREA Apply 1 application topically 3 (three) times daily. To shoulders/neck  0  . methocarbamol (ROBAXIN) 750 MG tablet Take 1 tablet (750 mg total) by mouth every 6 (six) hours as needed for muscle spasms. 90 tablet 1  . Multiple Vitamins-Minerals (MULTIVITAMINS THER. W/MINERALS) TABS Take 1 tablet by mouth daily.    . Multiple Vitamins-Minerals (PRESERVISION AREDS 2 PO) Take 1 capsule by mouth 2 (two) times daily.    . Omega-3 Fatty Acids (FISH OIL) 1200 MG CAPS Take 1 capsule by mouth 3 (three) times daily with meals.    Marland Kitchen oxyCODONE (OXY IR/ROXICODONE) 5 MG immediate release tablet Take 1 tablet (5 mg total) by mouth every 6 (six) hours as needed for severe pain. 100 tablet 0  . polyethylene glycol (MIRALAX / GLYCOLAX)  packet Take 17 g by mouth daily. 14 each 0  . senna-docusate (SENOKOT-S) 8.6-50 MG per tablet Take 1 tablet by mouth at bedtime.    . traMADol (ULTRAM) 50 MG tablet Take 1 tablet (50 mg total) by mouth every 6 (six) hours as needed for moderate pain. 60 tablet 0   No current facility-administered medications on file prior to visit.    Allergies  Allergen Reactions  . Codeine Other (See Comments)    Feet burn and agitation  . Morphine And Related Other (See Comments)    Feet burn and agitation    REVIEW OF SYSTEMS:  (Positives checked otherwise negative)  CARDIOVASCULAR:  []  chest pain, []  chest pressure, []  palpitations, []  shortness of breath when laying flat, []  shortness of breath with exertion,  []  pain in feet when walking, []  pain in feet when laying flat, []  history of blood clot in veins (DVT), []  history of phlebitis, []  swelling in legs, []  varicose veins  PULMONARY:  []  productive cough, []  asthma, []  wheezing  NEUROLOGIC:  []  weakness in arms or legs, []  numbness in arms or legs, [x]  difficulty speaking or slurred speech, []  temporary loss of vision in one eye, [x]  dizziness  HEMATOLOGIC:  []  bleeding problems, []  problems with blood clotting too easily  MUSCULOSKEL:  []  joint pain, []  joint swelling  GASTROINTEST:  []  vomiting blood, []  blood in stool     GENITOURINARY:  []  burning with urination, []  blood in urine  PSYCHIATRIC:  []  history of major depression  INTEGUMENTARY:  []  rashes, []  ulcers  CONSTITUTIONAL:  []  fever, []  chills  For VQI Use Only  PRE-ADM LIVING: Home  AMB STATUS: Ambulatory  CAD Sx: None  PRIOR CHF: None  STRESS TEST: [x]  No, [ ]  Normal, [ ]  + ischemia, [ ]  + MI, [ ]  Both   Physical Examination  Filed Vitals:   01/14/14 1320 01/14/14 1322  BP: 165/57 164/61  Pulse: 70   Height: 5\' 4"  (1.626 m)   Weight: 132 lb (59.875 kg)   SpO2: 100%    Body mass index is 22.65 kg/(m^2).  General: A&O x 3, WD, elderly  Head:  [place]/AT  Ear/Nose/Throat: Hearing grossly intact, nares w/o erythema or drainage, oropharynx w/o Erythema/Exudate, Mallampati score: 3  Eyes: PERRLA, EOMI, Post surg chg to lenses  Neck: Supple, no nuchal rigidity, no palpable LAD  Pulmonary: Sym exp, good air movt, CTAB, no rales, rhonchi, & wheezing  Cardiac: RRR, Nl S1, S2, no Murmurs, rubs or gallops  Vascular: Vessel Right Left  Radial Palpable Palpable  Brachial Palpable Palpable  Carotid Palpable, without bruit Palpable, without bruit  Aorta  Not palpable N/A  Femoral Palpable Palpable  Popliteal Palpable Not palpable  PT Palpable Palpable  DP Palpable Palpable   Gastrointestinal: soft, NTND, -G/R, - HSM, - masses, - CVAT B  Musculoskeletal: M/S 5/5 grossly throughout , somewhat limited exam due to recent L hip and L wrist surgery, Extremities without ischemic changes , B distal arm edema, TTP L wrist  Neurologic: CN 2-12 intact , Pain and light touch intact in extremities , Motor exam as listed above  Psychiatric: Judgment intact, Mood & affect appropriate for pt's clinical situation  Dermatologic: See M/S exam for extremity exam, no rashes otherwise noted  Lymph : No Cervical, Axillary, or Inguinal lymphadenopathy   Non-Invasive Vascular Imaging  Outside CAROTID DUPLEX (Date: 01/05/14):   R ICA stenosis: <50% (101/0)  R VA: patent and antegrade  L ICA stenosis: 50-69%  L VA: patent and antegrade  Outside Studies/Documentation 15 pages of outside documents were reviewed including: outpatient carotid duplex, Echo, MRI, CT and ED report and admission H&P.  Medical Decision Making  Jenna Ramos is a 80 y.o. female who presents with: Prior L CVA, L TIA, R carotid stenosis vs occlusion, L ICA stenosis 50-69%, hyperlipidemia   My experience is that the vascular lab that performed the outside B carotid study frequently requires additional studies to verify their result, as evident by the abnormal  velocities recorded in RICA 101/0.  A 0 c/s end-diastolic velocity is consistent with an occlusion not a <50% stenosis.  The recent MRI does not demonstrate a CVA in L brain, rather an old CVA.  Clearly, this patient likely had a TIA.  The only question is whether a L carotid plaque is the cause.  I would obtain a CTA neck to help determine the degree of stenosis in both carotid arteries.  She will follow up after that test is available.  I discussed in depth with the patient the nature of atherosclerosis, and emphasized the importance of maximal medical management including strict control of blood pressure, blood glucose, and lipid levels, obtaining regular exercise, antiplatelet agents, and cessation of smoking.   The patient is currently on a statin: Lipitor.  Pt was only recently started on Lipitor.  Target is a LDL <70, which she is no where near currently.  The patient is currently on an anti-platelet: ASA.  The patient is aware that without maximal medical management the underlying atherosclerotic disease process will progress, limiting the benefit of any interventions.  Thank you for allowing [place] to participate in this patient's care.  Adele Barthel, MD [facility] Office: [phone] Pager: [phone]  01/14/2014, 1:49 PM

## 2014-01-20 ENCOUNTER — Encounter (INDEPENDENT_AMBULATORY_CARE_PROVIDER_SITE_OTHER): Payer: BC Managed Care – PPO | Admitting: Ophthalmology

## 2014-01-20 DIAGNOSIS — H3532 Exudative age-related macular degeneration: Secondary | ICD-10-CM

## 2014-01-20 DIAGNOSIS — H43813 Vitreous degeneration, bilateral: Secondary | ICD-10-CM

## 2014-01-20 DIAGNOSIS — H35033 Hypertensive retinopathy, bilateral: Secondary | ICD-10-CM | POA: Diagnosis not present

## 2014-01-20 DIAGNOSIS — I10 Essential (primary) hypertension: Secondary | ICD-10-CM | POA: Diagnosis not present

## 2014-01-22 ENCOUNTER — Encounter: Payer: Self-pay | Admitting: Vascular Surgery

## 2014-01-23 ENCOUNTER — Ambulatory Visit: Payer: Federal, State, Local not specified - PPO | Admitting: Vascular Surgery

## 2014-01-23 ENCOUNTER — Encounter: Payer: Self-pay | Admitting: Vascular Surgery

## 2014-01-23 ENCOUNTER — Ambulatory Visit
Admission: RE | Admit: 2014-01-23 | Discharge: 2014-01-23 | Disposition: A | Discharge status: Home / Self Care | Payer: Medicare Other | Source: Ambulatory Visit | Attending: Vascular Surgery | Admitting: Vascular Surgery

## 2014-01-23 ENCOUNTER — Ambulatory Visit (INDEPENDENT_AMBULATORY_CARE_PROVIDER_SITE_OTHER): Payer: Federal, State, Local not specified - PPO | Admitting: Vascular Surgery

## 2014-01-23 VITALS — BP 156/58 | HR 85 | Resp 16 | Ht 63.0 in | Wt 123.0 lb

## 2014-01-23 DIAGNOSIS — G459 Transient cerebral ischemic attack, unspecified: Secondary | ICD-10-CM

## 2014-01-23 DIAGNOSIS — Z01818 Encounter for other preprocedural examination: Secondary | ICD-10-CM

## 2014-01-23 DIAGNOSIS — I6523 Occlusion and stenosis of bilateral carotid arteries: Secondary | ICD-10-CM

## 2014-01-23 HISTORY — DX: Transient cerebral ischemic attack, unspecified: G45.9

## 2014-01-23 MED ORDER — IOHEXOL 350 MG/ML SOLN
75.0000 mL | Freq: Once | INTRAVENOUS | Status: AC | PRN
Start: 2014-01-23 — End: 2014-01-23
  Administered 2014-01-23: 75 mL via INTRAVENOUS

## 2014-01-23 NOTE — Progress Notes (Signed)
    Established Carotid Patient  History of Present Illness  Jenna Ramos is a 80 y.o. (1928-10-29) female who presents with chief complaint: follow up CTA Neck.  Pt returns for CTA after likely errorneous B carotid duplex at outside facility.  She has had prior L CVA and recently L TIA manifested as aphasia.  She has had no further events since then.  The patient's PMH, PSH, SH, FamHx, Med, and Allergies are unchanged from 01/14/14.  On ROS today: no TIA or CVA sx, No rest pain or intermittent claudication   Physical Examination  Filed Vitals:   01/23/14 1547 01/23/14 1551  BP: 183/67 156/58  Pulse: 70 85  Resp: 16   Height: 5\' 3"  (1.6 m)   Weight: 123 lb (55.792 kg)    Body mass index is 21.79 kg/(m^2).  General: A&O x 3, WD, elderly  Eyes: PERRLA, EOMI, Post surg chg to lenses  Pulmonary: Sym exp, good air movt, CTAB, no rales, rhonchi, & wheezing  Cardiac: RRR, Nl S1, S2, no Murmurs, rubs or gallops  Vascular: Vessel Right Left  Radial Palpable Palpable  Brachial Palpable Palpable  Carotid Palpable, without bruit Palpable, without bruit  Aorta Not palpable N/A  Femoral Palpable Palpable  Popliteal Palpable Not palpable  PT Palpable Palpable  DP Palpable Palpable   Gastrointestinal: soft, NTND, -G/R, - HSM, - masses, - CVAT B  Musculoskeletal: M/S 5/5 grossly throughout , somewhat limited exam due to recent L hip and L wrist surgery, Extremities without ischemic changes , B distal arm edema, TTP L wrist  Neurologic: CN 2-12 intact , Pain and light touch intact in extremities , Motor exam as listed above  CTA Neck (01/23/2014) 1. Left carotid bifurcation plaque (mostly calcified) resulting an up to 70% stenosis at the left ICA origin. 2. No hemodynamically significant right carotid stenosis, but evidence of tiny ulcerated plaques in the medial right ICA bulb (series 4, image 60). 3. Dominant left vertebral artery, no vertebral  artery stenosis.  Based on my review her CTA Neck, she has a calcified stenosis in L ICA with >70% stenosis that is surgically accessible.   Medical Decision Making  Jenna Ramos is a 80 y.o. female who presents with: asx RICA stenosis <50%, sx L ICA stenosi >70%   Based on the patient's vascular studies and examination, I have offered the patient: L CEA vs CAS.  Cardiac risk stratification will help determine which procedure though CREST suggests CEA is a better option due to the high stroke rate with CAS in elderly patients.  I discussed in depth with the patient the nature of atherosclerosis, and emphasized the importance of maximal medical management including strict control of blood pressure, blood glucose, and lipid levels, antiplatelet agents, obtaining regular exercise, and cessation of smoking.    The patient is aware that without maximal medical management the underlying atherosclerotic disease process will progress, limiting the benefit of any interventions. The patient is currently on a statin: Lipitor.  The patient is currently not on an anti-platelet due to Eliquis use.    The patient will return for preoperative counseling once her cardiology evaluation is completed.  Thank you for allowing [place] to participate in this patient's care.  Jenna Barthel, MD [facility] Office: [phone] Pager: [phone]  01/23/2014, 4:26 PM

## 2014-01-26 ENCOUNTER — Other Ambulatory Visit: Payer: Self-pay | Admitting: Physical Medicine and Rehabilitation

## 2014-01-27 ENCOUNTER — Telehealth: Payer: Self-pay | Admitting: Family Medicine

## 2014-01-27 NOTE — Telephone Encounter (Signed)
I spoke with patient and advised her that we will not be able to fill her medication until we see her here at the office. I have called [facility] asking if they will refill her benazepril for her since she has seen them. I am waiting on them to return my call.

## 2014-01-28 ENCOUNTER — Telehealth: Payer: Self-pay

## 2014-01-28 MED ORDER — BENAZEPRIL HCL 10 MG PO TABS: 10.0000 mg | ORAL_TABLET | Freq: Every day | ORAL | Status: DC

## 2014-01-28 NOTE — Telephone Encounter (Signed)
rec'd phone call from [place] @ [facility].  Stated pt. Is out of her Benazepril.  Abigail Butts is requesting that Dr. Bridgett Larsson refill her Rx, as pt. is not yet established with their office; has new pt. appt. 03/11/14.  Phone call to pt. Stated she is changing her PCP, and since she recently saw Dr. Bridgett Larsson, questioned if he could refill her Benazepril.  Will contact Dr. Bridgett Larsson for authorization of refill on med. equested.

## 2014-01-28 NOTE — Telephone Encounter (Signed)
Order refilled

## 2014-01-28 NOTE — Telephone Encounter (Signed)
Dr. Lianne Moris office filled her Benazepril for her and patient notified.

## 2014-01-28 NOTE — Addendum Note (Signed)
Addended by: Conrad Kennes[place] on: 01/28/2014 11:22 AM   Modules accepted: Orders

## 2014-02-12 ENCOUNTER — Encounter: Payer: Self-pay | Admitting: Vascular Surgery

## 2014-02-13 ENCOUNTER — Ambulatory Visit (INDEPENDENT_AMBULATORY_CARE_PROVIDER_SITE_OTHER): Payer: Federal, State, Local not specified - PPO | Admitting: Vascular Surgery

## 2014-02-13 ENCOUNTER — Encounter: Payer: Self-pay | Admitting: Vascular Surgery

## 2014-02-13 ENCOUNTER — Other Ambulatory Visit: Payer: Self-pay

## 2014-02-13 VITALS — BP 156/56 | HR 76 | Ht 63.0 in | Wt 123.5 lb

## 2014-02-13 DIAGNOSIS — G459 Transient cerebral ischemic attack, unspecified: Secondary | ICD-10-CM

## 2014-02-13 DIAGNOSIS — I6522 Occlusion and stenosis of left carotid artery: Secondary | ICD-10-CM

## 2014-02-13 NOTE — Progress Notes (Signed)
Established Carotid Patient  History of Present Illness  Jenna Ramos is a 80 y.o. (Feb 27, 1929) female who presents with chief complaint: follow from cardiac clearance.  Previous carotid studies demonstrated: RICA <56 stenosis, LICA 25-63% stenosis. The patient was recently admitted locally for presumed TIA with receptive aphasia. The patient previously has had an episode of sensation of "head floating away" with inability to write for several days. This resolved after a few days. The patient has never had amaurosis fugax or monocular blindness. The patient has never had facial drooping or hemiplegia. The patient has never had expressive aphasia. The patient's previous neurologic deficits have resolved. The patient's risks factors for carotid disease include: hyperlipidemia, HTN.  Her recent cardiac clearance was deemed low risk due to a negative stress test per Dr. Almira Coaster ([place]).  The patient's previous neurologic deficits have resolved.  Past Medical History  Diagnosis Date  . Rectal incontinence   . Hypertension   . Arthritis   . Hypothyroidism   . Anemia   . Fibromyalgia   . Glaucoma   . Macular degeneration   . HOH (hard of hearing)   . Stroke     Past Surgical History  Procedure Laterality Date  . Tubal ligation    . Appendectomy      History   Social History  . Marital Status: Widowed    Spouse Name: N/A    Number of Children: N/A  . Years of Education: N/A   Occupational History  . Not on file.   Social History Main Topics  . Smoking status: Never Smoker   . Smokeless tobacco: Never Used  . Alcohol Use: No  . Drug Use: No  . Sexual Activity: Not on file   Other Topics Concern  . Not on file   Social History Narrative    Family History  Problem Relation Age of Onset  . Prostate cancer Father   . Cancer Father   . Lung disease Sister   . Cancer Brother     Current Outpatient Prescriptions on File Prior  to Visit  Medication Sig Dispense Refill  . atorvastatin (LIPITOR) 40 MG tablet Take 40 mg by mouth daily.    . benazepril (LOTENSIN) 10 MG tablet Take 1 tablet (10 mg total) by mouth daily. 30 tablet 1  . BESIVANCE 0.6 % SUSP     . bimatoprost (LUMIGAN) 0.03 % ophthalmic solution Place 1 drop into both eyes at bedtime.    . Calcium Carbonate-Vitamin D (CALCIUM 600/VITAMIN D) 600-400 MG-UNIT per tablet Take 1 tablet by mouth daily.    . cephALEXin (KEFLEX) 500 MG capsule Take 1 capsule (500 mg total) by mouth 3 (three) times daily. 21 capsule 0  . cyclobenzaprine (FLEXERIL) 10 MG tablet     . FLUZONE HIGH-DOSE 0.5 ML SUSY Inject 1 application into the muscle once.    . gabapentin (NEURONTIN) 100 MG capsule Take 2 capsules (200 mg total) by mouth 3 (three) times daily. For fibromyalgia 180 capsule 1  . HYDROcodone-acetaminophen (NORCO/VICODIN) 5-325 MG per tablet Take 1-2 tablets by mouth every 4 (four) hours as needed. 16 tablet 0  . iron polysaccharides (NIFEREX) 150 MG capsule Take 1 capsule (150 mg total) by mouth 2 (two) times daily before lunch and supper. 60 capsule 1  . levothyroxine (SYNTHROID, LEVOTHROID) 88 MCG tablet Take 44-88 mcg by mouth daily. 1 daily Mon-Fri and 1/2 tab on Sat-Sun    . LUMIGAN 0.01 % SOLN     .  Menthol-Methyl Salicylate (MUSCLE RUB) 10-15 % CREA Apply 1 application topically 3 (three) times daily. To shoulders/neck  0  . methocarbamol (ROBAXIN) 750 MG tablet Take 1 tablet (750 mg total) by mouth every 6 (six) hours as needed for muscle spasms. 90 tablet 1  . Multiple Vitamins-Minerals (MULTIVITAMINS THER. W/MINERALS) TABS Take 1 tablet by mouth daily.    . Multiple Vitamins-Minerals (PRESERVISION AREDS 2 PO) Take 1 capsule by mouth 2 (two) times daily.    . Omega-3 Fatty Acids (FISH OIL) 1200 MG CAPS Take 1 capsule by mouth 3 (three) times daily with meals.    Marland Kitchen oxyCODONE (OXY IR/ROXICODONE) 5 MG immediate release tablet Take 1 tablet (5 mg total) by mouth every 6  (six) hours as needed for severe pain. 100 tablet 0  . polyethylene glycol (MIRALAX / GLYCOLAX) packet Take 17 g by mouth daily. 14 each 0  . senna-docusate (SENOKOT-S) 8.6-50 MG per tablet Take 1 tablet by mouth at bedtime.    . traMADol (ULTRAM) 50 MG tablet Take 1 tablet (50 mg total) by mouth every 6 (six) hours as needed for moderate pain. 60 tablet 0   No current facility-administered medications on file prior to visit.    Allergies  Allergen Reactions  . Aggrenox [Aspirin-Dipyridamole Er] Anaphylaxis    Severe headache  . Codeine Other (See Comments)    Feet burn and agitation  . Morphine And Related Other (See Comments)    Feet burn and agitation   REVIEW OF SYSTEMS: (Positives checked otherwise negative)  CARDIOVASCULAR: []  chest pain, []  chest pressure, []  palpitations, []  shortness of breath when laying flat, []  shortness of breath with exertion, []  pain in feet when walking, []  pain in feet when laying flat, []  history of blood clot in veins (DVT), []  history of phlebitis, []  swelling in legs, []  varicose veins  PULMONARY: []  productive cough, []  asthma, []  wheezing  NEUROLOGIC: []  weakness in arms or legs, []  numbness in arms or legs, [x]  difficulty speaking or slurred speech, []  temporary loss of vision in one eye, [x]  dizziness  HEMATOLOGIC: []  bleeding problems, []  problems with blood clotting too easily  MUSCULOSKEL: []  joint pain, []  joint swelling  GASTROINTEST: []  vomiting blood, []  blood in stool   GENITOURINARY: []  burning with urination, []  blood in urine  PSYCHIATRIC: []  history of major depression  INTEGUMENTARY: []  rashes, []  ulcers  CONSTITUTIONAL: []  fever, []  chills   Physical Examination  Filed Vitals:   02/13/14 1446 02/13/14 1448  BP: 161/57 156/56  Pulse: 76   Height: 5\' 3"  (1.6 m)   Weight: 123 lb 8 oz (56.019 kg)   SpO2: 99%    Body mass index is 21.88 kg/(m^2).  General: A&O x 3, WD, elderly  Head:  [place]/AT  Ear/Nose/Throat: Hearing grossly intact, nares w/o erythema or drainage, oropharynx w/o Erythema/Exudate, Mallampati score: 3  Eyes: PERRLA, EOMI, Post surg chg to lenses  Neck: Supple, no nuchal rigidity, no palpable LAD  Pulmonary: Sym exp, good air movt, CTAB, no rales, rhonchi, & wheezing  Cardiac: RRR, Nl S1, S2, no Murmurs, rubs or gallops  Vascular: Vessel Right Left  Radial Palpable Palpable  Brachial Palpable Palpable  Carotid Palpable, without bruit Palpable, without bruit  Aorta Not palpable N/A  Femoral Palpable Palpable  Popliteal Palpable Not palpable  PT Palpable Palpable  DP Palpable Palpable   Gastrointestinal: soft, NTND, -G/R, - HSM, - masses, - CVAT B  Musculoskeletal: M/S 5/5 grossly throughout , somewhat limited exam due  to recent L hip and L wrist surgery, Extremities without ischemic changes , B distal arm edema, TTP L wrist  Neurologic: CN 2-12 intact , Pain and light touch intact in extremities , Motor exam as listed above  Psychiatric: Judgment intact, Mood & affect appropriate for pt's clinical situation  Dermatologic: See M/S exam for extremity exam, no rashes otherwise noted  Lymph : No Cervical, Axillary, or Inguinal lymphadenopathy     Medical Decision Making  AMNA WELKER is a 80 y.o. female who presents with: asx RICA stenosis <50%, sx L ICA stenosis >70%   Based on the patient's vascular studies and examination, I have offered the patient: L CEA. I discussed with the patient the risks, benefits, and alternatives to carotid endarterectomy.   The patient is not a good candidate for carotid artery stenting based on CREST. I discussed the procedural details of carotid endarterectomy with the patient.  The patient is aware that the risks of carotid endarterectomy include but are not limited to: bleeding, infection, stroke, myocardial infarction, death, cranial nerve injuries both temporary and  permanent, neck hematoma, possible airway compromise, labile blood pressure post-operatively, cerebral hyperperfusion syndrome, and possible need for additional interventions in the future.  The patient is aware of the risks and agrees to proceed forward with the procedure.  I discussed in depth with the patient the nature of atherosclerosis, and emphasized the importance of maximal medical management including strict control of blood pressure, blood glucose, and lipid levels, antiplatelet agents, obtaining regular exercise, and cessation of smoking.    The patient is aware that without maximal medical management the underlying atherosclerotic disease process will progress, limiting the benefit of any interventions. The patient is currently on a statin: Lipitor.   The patient is currently on an anti-platelet: ASA.  Thank you for allowing [place] to participate in this patient's care.  Adele Barthel, MD [facility] Office: [phone] Pager: [phone]  02/13/2014, 4:38 PM

## 2014-02-17 ENCOUNTER — Encounter (HOSPITAL_COMMUNITY): Payer: Self-pay

## 2014-02-17 ENCOUNTER — Other Ambulatory Visit (HOSPITAL_COMMUNITY): Payer: Federal, State, Local not specified - PPO

## 2014-02-18 ENCOUNTER — Encounter (HOSPITAL_COMMUNITY): Payer: Self-pay

## 2014-02-18 ENCOUNTER — Encounter (INDEPENDENT_AMBULATORY_CARE_PROVIDER_SITE_OTHER): Payer: BC Managed Care – PPO | Admitting: Ophthalmology

## 2014-02-18 ENCOUNTER — Other Ambulatory Visit (HOSPITAL_COMMUNITY): Payer: Federal, State, Local not specified - PPO

## 2014-02-18 ENCOUNTER — Encounter (HOSPITAL_COMMUNITY)
Admission: RE | Admit: 2014-02-18 | Discharge: 2014-02-18 | Disposition: A | Discharge status: Home / Self Care | Payer: Medicare Other | Source: Ambulatory Visit | Attending: Vascular Surgery | Admitting: Vascular Surgery

## 2014-02-18 HISTORY — DX: Peripheral vascular disease, unspecified: I73.9

## 2014-02-18 HISTORY — DX: Nocturia: R35.1

## 2014-02-18 HISTORY — DX: Hyperlipidemia, unspecified: E78.5

## 2014-02-18 LAB — COMPREHENSIVE METABOLIC PANEL
ALK PHOS: 83 U/L (ref 39–117)
ALT: 11 U/L (ref 0–35)
ANION GAP: 14 (ref 5–15)
AST: 17 U/L (ref 0–37)
Albumin: 3.7 g/dL (ref 3.5–5.2)
BUN: 15 mg/dL (ref 6–23)
CO2: 27 meq/L (ref 19–32)
Calcium: 10.2 mg/dL (ref 8.4–10.5)
Chloride: 99 mEq/L (ref 96–112)
Creatinine, Ser: 0.48 mg/dL — ABNORMAL LOW (ref 0.50–1.10)
GFR calc Af Amer: >90 mL/min (ref >90)
GFR, EST NON AFRICAN AMERICAN: 88 mL/min — AB (ref >90)
Glucose, Bld: 103 mg/dL — ABNORMAL HIGH (ref 70–99)
Potassium: 3.8 mEq/L (ref 3.7–5.3)
SODIUM: 140 meq/L (ref 137–147)
Total Bilirubin: 0.3 mg/dL (ref 0.3–1.2)
Total Protein: 8.2 g/dL (ref 6.0–8.3)

## 2014-02-18 LAB — CBC
HEMATOCRIT: 35.7 % — AB (ref 36.0–46.0)
HEMOGLOBIN: 11 g/dL — AB (ref 12.0–15.0)
MCH: 22.6 pg — ABNORMAL LOW (ref 26.0–34.0)
MCHC: 30.8 g/dL (ref 30.0–36.0)
MCV: 73.3 fL — ABNORMAL LOW (ref 78.0–100.0)
Platelets: 391 10*3/uL (ref 150–400)
RBC: 4.87 MIL/uL (ref 3.87–5.11)
RDW: 18.3 % — ABNORMAL HIGH (ref 11.5–15.5)
WBC: 7.4 10*3/uL (ref 4.0–10.5)

## 2014-02-18 LAB — SURGICAL PCR SCREEN
MRSA, PCR: NEGATIVE
STAPHYLOCOCCUS AUREUS: NEGATIVE

## 2014-02-18 LAB — URINALYSIS, ROUTINE W REFLEX MICROSCOPIC
Bilirubin Urine: NEGATIVE
GLUCOSE, UA: NEGATIVE mg/dL
HGB URINE DIPSTICK: NEGATIVE
Ketones, ur: NEGATIVE mg/dL
Nitrite: NEGATIVE
PH: 7 (ref 5.0–8.0)
Protein, ur: NEGATIVE mg/dL
SPECIFIC GRAVITY, URINE: 1.011 (ref 1.005–1.030)
Urobilinogen, UA: 0.2 mg/dL (ref 0.0–1.0)

## 2014-02-18 LAB — ABO/RH: ABO/RH(D): A POS

## 2014-02-18 LAB — URINE MICROSCOPIC-ADD ON

## 2014-02-18 LAB — PROTIME-INR
INR: 1.03 (ref 0.00–1.49)
Prothrombin Time: 13.6 seconds (ref 11.6–15.2)

## 2014-02-18 LAB — APTT: aPTT: 22 seconds — ABNORMAL LOW (ref 24–37)

## 2014-02-18 LAB — TYPE AND SCREEN
ABO/RH(D): A POS
Antibody Screen: NEGATIVE

## 2014-02-18 MED ORDER — CHLORHEXIDINE GLUCONATE CLOTH 2 % EX PADS: 6.0000 | MEDICATED_PAD | Freq: Once | CUTANEOUS | Status: DC

## 2014-02-18 MED ORDER — DEXTROSE 5 % IV SOLN
1.5000 g | INTRAVENOUS | Status: AC
  Administered 2014-02-19: 1.5 g via INTRAVENOUS
  Filled 2014-02-18: qty 1.5

## 2014-02-18 MED ORDER — SODIUM CHLORIDE 0.9 % IV SOLN: INTRAVENOUS | Status: DC

## 2014-02-18 NOTE — Pre-Procedure Instructions (Signed)
Desirea Mizrahi Alphin  02/18/2014   Your procedure is scheduled on:  Thurs, Dec 17 @ 8:30 AM  Report to [facility]  at 6:30 AM.  Call this number if you have problems the morning of surgery: [phone]   Remember:   Do not eat food or drink liquids after midnight.   Take these medicines the morning of surgery with A SIP OF WATER: Pain Pill(if needed) and Synthroid(Levothyroxine)                 No Goody's,BC's,Aleve,Aspirin,Ibuprofen,Fish Oil,or any Herbal Medications   Do not wear jewelry, make-up or nail polish.  Do not wear lotions, powders, or perfumes. You may wear deodorant.  Do not shave 48 hours prior to surgery.   Do not bring valuables to the hospital.  [facility] is not responsible                  for any belongings or valuables.               Contacts, dentures or bridgework may not be worn into surgery.  Leave suitcase in the car. After surgery it may be brought to your room.  For patients admitted to the hospital, discharge time is determined by your                treatment team.                   Special Instructions:  [place] - Preparing for Surgery  Before surgery, you can play an important role.  Because skin is not sterile, your skin needs to be as free of germs as possible.  You can reduce the number of germs on you skin by washing with CHG (chlorahexidine gluconate) soap before surgery.  CHG is an antiseptic cleaner which kills germs and bonds with the skin to continue killing germs even after washing.  Please DO NOT use if you have an allergy to CHG or antibacterial soaps.  If your skin becomes reddened/irritated stop using the CHG and inform your nurse when you arrive at Short Stay.  Do not shave (including legs and underarms) for at least 48 hours prior to the first CHG shower.  You may shave your face.  Please follow these instructions carefully:   1.  Shower with CHG Soap the night before surgery and the                                 morning of Surgery.  2.  If you choose to wash your hair, wash your hair first as usual with your       normal shampoo.  3.  After you shampoo, rinse your hair and body thoroughly to remove the                      Shampoo.  4.  Use CHG as you would any other liquid soap.  You can apply chg directly       to the skin and wash gently with scrungie or a clean washcloth.  5.  Apply the CHG Soap to your body ONLY FROM THE NECK DOWN.        Do not use on open wounds or open sores.  Avoid contact with your eyes,       ears, mouth and genitals (private parts).  Wash genitals (private parts)  with your normal soap.  6.  Wash thoroughly, paying special attention to the area where your surgery        will be performed.  7.  Thoroughly rinse your body with warm water from the neck down.  8.  DO NOT shower/wash with your normal soap after using and rinsing off       the CHG Soap.  9.  Pat yourself dry with a clean towel.            10.  Wear clean pajamas.            11.  Place clean sheets on your bed the night of your first shower and do not        sleep with pets.  Day of Surgery  Do not apply any lotions/deoderants the morning of surgery.  Please wear clean clothes to the hospital/surgery center.     Please read over the following fact sheets that you were given: Pain Booklet, Coughing and Deep Breathing, Blood Transfusion Information, MRSA Information and Surgical Site Infection Prevention

## 2014-02-18 NOTE — Progress Notes (Addendum)
Echo report in epic from 2012  Stress test in epic from 2015  CXR in epic from 01-06-14  Medical Md is Dr.Stephanie Sabra Heck  Pt doesn't have a cardiologist  Denies EKG in past yr

## 2014-02-19 ENCOUNTER — Inpatient Hospital Stay (HOSPITAL_COMMUNITY)
Admission: RE | Admit: 2014-02-19 | Discharge: 2014-02-20 | DRG: 039 | Disposition: A | Discharge status: Home / Self Care | Payer: Medicare Other | Source: Ambulatory Visit | Attending: Vascular Surgery | Admitting: Vascular Surgery

## 2014-02-19 ENCOUNTER — Inpatient Hospital Stay (HOSPITAL_COMMUNITY): Payer: Medicare Other | Admitting: Critical Care Medicine

## 2014-02-19 ENCOUNTER — Encounter (HOSPITAL_COMMUNITY): Payer: Self-pay | Admitting: *Deleted

## 2014-02-19 ENCOUNTER — Encounter (HOSPITAL_COMMUNITY)
Admission: RE | Disposition: A | Discharge status: Home / Self Care | Payer: Self-pay | Source: Ambulatory Visit | Attending: Vascular Surgery

## 2014-02-19 DIAGNOSIS — H538 Other visual disturbances: Secondary | ICD-10-CM | POA: Diagnosis not present

## 2014-02-19 DIAGNOSIS — H9190 Unspecified hearing loss, unspecified ear: Secondary | ICD-10-CM | POA: Diagnosis present

## 2014-02-19 DIAGNOSIS — Z79899 Other long term (current) drug therapy: Secondary | ICD-10-CM

## 2014-02-19 DIAGNOSIS — E039 Hypothyroidism, unspecified: Secondary | ICD-10-CM | POA: Diagnosis present

## 2014-02-19 DIAGNOSIS — Z7982 Long term (current) use of aspirin: Secondary | ICD-10-CM

## 2014-02-19 DIAGNOSIS — Z809 Family history of malignant neoplasm, unspecified: Secondary | ICD-10-CM | POA: Diagnosis not present

## 2014-02-19 DIAGNOSIS — H409 Unspecified glaucoma: Secondary | ICD-10-CM | POA: Diagnosis present

## 2014-02-19 DIAGNOSIS — M797 Fibromyalgia: Secondary | ICD-10-CM | POA: Diagnosis present

## 2014-02-19 DIAGNOSIS — Z888 Allergy status to other drugs, medicaments and biological substances status: Secondary | ICD-10-CM

## 2014-02-19 DIAGNOSIS — I10 Essential (primary) hypertension: Secondary | ICD-10-CM | POA: Diagnosis present

## 2014-02-19 DIAGNOSIS — H3530 Unspecified macular degeneration: Secondary | ICD-10-CM | POA: Diagnosis present

## 2014-02-19 DIAGNOSIS — I779 Disorder of arteries and arterioles, unspecified: Secondary | ICD-10-CM | POA: Diagnosis present

## 2014-02-19 DIAGNOSIS — I6522 Occlusion and stenosis of left carotid artery: Principal | ICD-10-CM | POA: Diagnosis present

## 2014-02-19 DIAGNOSIS — E785 Hyperlipidemia, unspecified: Secondary | ICD-10-CM | POA: Diagnosis present

## 2014-02-19 DIAGNOSIS — Z87891 Personal history of nicotine dependence: Secondary | ICD-10-CM | POA: Diagnosis not present

## 2014-02-19 DIAGNOSIS — I739 Peripheral vascular disease, unspecified: Secondary | ICD-10-CM

## 2014-02-19 DIAGNOSIS — Z8673 Personal history of transient ischemic attack (TIA), and cerebral infarction without residual deficits: Secondary | ICD-10-CM | POA: Diagnosis not present

## 2014-02-19 DIAGNOSIS — Z885 Allergy status to narcotic agent status: Secondary | ICD-10-CM

## 2014-02-19 HISTORY — PX: ENDARTERECTOMY: SHX5162

## 2014-02-19 SURGERY — ENDARTERECTOMY, CAROTID
Anesthesia: General | Site: Neck | Laterality: Left

## 2014-02-19 MED ORDER — LIDOCAINE HCL (CARDIAC) 20 MG/ML IV SOLN
INTRAVENOUS | Status: AC
  Filled 2014-02-19: qty 5

## 2014-02-19 MED ORDER — SENNOSIDES-DOCUSATE SODIUM 8.6-50 MG PO TABS
1.0000 | ORAL_TABLET | Freq: Every evening | ORAL | Status: DC | PRN
  Filled 2014-02-19: qty 1

## 2014-02-19 MED ORDER — PANTOPRAZOLE SODIUM 40 MG PO TBEC
40.0000 mg | DELAYED_RELEASE_TABLET | Freq: Every day | ORAL | Status: DC
  Administered 2014-02-19 – 2014-02-20 (×2): 40 mg via ORAL
  Filled 2014-02-19 (×2): qty 1

## 2014-02-19 MED ORDER — LEVOTHYROXINE SODIUM 88 MCG PO TABS: 44.0000 ug | ORAL_TABLET | Freq: Every day | ORAL | Status: DC

## 2014-02-19 MED ORDER — DEXAMETHASONE SODIUM PHOSPHATE 10 MG/ML IJ SOLN
INTRAMUSCULAR | Status: AC
  Filled 2014-02-19: qty 1

## 2014-02-19 MED ORDER — HYDROCODONE-ACETAMINOPHEN 5-325 MG PO TABS: 1.0000 | ORAL_TABLET | ORAL | Status: DC | PRN

## 2014-02-19 MED ORDER — BENAZEPRIL HCL 10 MG PO TABS
10.0000 mg | ORAL_TABLET | Freq: Every day | ORAL | Status: DC
  Administered 2014-02-20: 10 mg via ORAL
  Filled 2014-02-19: qty 1

## 2014-02-19 MED ORDER — PROPOFOL 10 MG/ML IV BOLUS
INTRAVENOUS | Status: AC
  Filled 2014-02-19: qty 20

## 2014-02-19 MED ORDER — LABETALOL HCL 5 MG/ML IV SOLN
INTRAVENOUS | Status: DC | PRN
  Administered 2014-02-19: 5 mg via INTRAVENOUS

## 2014-02-19 MED ORDER — PHENYLEPHRINE HCL 10 MG/ML IJ SOLN
10.0000 mg | INTRAVENOUS | Status: DC | PRN
  Administered 2014-02-19: 25 ug/min via INTRAVENOUS

## 2014-02-19 MED ORDER — SODIUM CHLORIDE 0.9 % IV SOLN
INTRAVENOUS | Status: DC
  Administered 2014-02-19: 23:00:00 via INTRAVENOUS

## 2014-02-19 MED ORDER — HYDRALAZINE HCL 20 MG/ML IJ SOLN
5.0000 mg | INTRAMUSCULAR | Status: DC | PRN
  Filled 2014-02-19: qty 0.25

## 2014-02-19 MED ORDER — PHENOL 1.4 % MT LIQD: 1.0000 | OROMUCOSAL | Status: DC | PRN

## 2014-02-19 MED ORDER — ACETAMINOPHEN 650 MG RE SUPP: 325.0000 mg | RECTAL | Status: DC | PRN

## 2014-02-19 MED ORDER — POTASSIUM CHLORIDE CRYS ER 20 MEQ PO TBCR
20.0000 meq | EXTENDED_RELEASE_TABLET | Freq: Every day | ORAL | Status: DC | PRN
Start: 2014-02-19 — End: 2014-02-20

## 2014-02-19 MED ORDER — ONDANSETRON HCL 4 MG/2ML IJ SOLN: 4.0000 mg | Freq: Four times a day (QID) | INTRAMUSCULAR | Status: DC | PRN

## 2014-02-19 MED ORDER — ONDANSETRON HCL 4 MG/2ML IJ SOLN
INTRAMUSCULAR | Status: DC | PRN
  Administered 2014-02-19: 4 mg via INTRAVENOUS

## 2014-02-19 MED ORDER — HEPARIN SODIUM (PORCINE) 1000 UNIT/ML IJ SOLN
INTRAMUSCULAR | Status: AC
  Filled 2014-02-19: qty 1

## 2014-02-19 MED ORDER — EPHEDRINE SULFATE 50 MG/ML IJ SOLN
INTRAMUSCULAR | Status: AC
  Filled 2014-02-19: qty 1

## 2014-02-19 MED ORDER — ROCURONIUM BROMIDE 100 MG/10ML IV SOLN
INTRAVENOUS | Status: DC | PRN
  Administered 2014-02-19: 30 mg via INTRAVENOUS

## 2014-02-19 MED ORDER — BISACODYL 10 MG RE SUPP: 10.0000 mg | Freq: Every day | RECTAL | Status: DC | PRN

## 2014-02-19 MED ORDER — DEXTROSE 5 % IV SOLN
1.5000 g | Freq: Two times a day (BID) | INTRAVENOUS | Status: AC
  Administered 2014-02-19 – 2014-02-20 (×2): 1.5 g via INTRAVENOUS
  Filled 2014-02-19 (×3): qty 1.5

## 2014-02-19 MED ORDER — ALUM & MAG HYDROXIDE-SIMETH 200-200-20 MG/5ML PO SUSP: 15.0000 mL | ORAL | Status: DC | PRN

## 2014-02-19 MED ORDER — LEVOTHYROXINE SODIUM 88 MCG PO TABS
88.0000 ug | ORAL_TABLET | ORAL | Status: DC
  Filled 2014-02-19 (×2): qty 1

## 2014-02-19 MED ORDER — POLYSACCHARIDE IRON COMPLEX 150 MG PO CAPS
150.0000 mg | ORAL_CAPSULE | Freq: Two times a day (BID) | ORAL | Status: DC
  Administered 2014-02-20: 150 mg via ORAL
  Filled 2014-02-19 (×2): qty 1

## 2014-02-19 MED ORDER — PROMETHAZINE HCL 25 MG/ML IJ SOLN: 6.2500 mg | INTRAMUSCULAR | Status: DC | PRN

## 2014-02-19 MED ORDER — SODIUM CHLORIDE 0.9 % IJ SOLN
INTRAMUSCULAR | Status: AC
  Filled 2014-02-19: qty 10

## 2014-02-19 MED ORDER — INFLUENZA VAC SPLIT HIGH-DOSE 0.5 ML IM SUSY: 1.0000 "application " | PREFILLED_SYRINGE | Freq: Once | INTRAMUSCULAR | Status: DC

## 2014-02-19 MED ORDER — FENTANYL CITRATE 0.05 MG/ML IJ SOLN
INTRAMUSCULAR | Status: AC
  Filled 2014-02-19: qty 5

## 2014-02-19 MED ORDER — ENOXAPARIN SODIUM 30 MG/0.3ML ~~LOC~~ SOLN
30.0000 mg | SUBCUTANEOUS | Status: DC
  Administered 2014-02-20: 30 mg via SUBCUTANEOUS
  Filled 2014-02-19 (×2): qty 0.3

## 2014-02-19 MED ORDER — HEPARIN SODIUM (PORCINE) 1000 UNIT/ML IJ SOLN
INTRAMUSCULAR | Status: DC | PRN
  Administered 2014-02-19: 5000 [IU] via INTRAVENOUS

## 2014-02-19 MED ORDER — LEVOTHYROXINE SODIUM 88 MCG PO TABS
44.0000 ug | ORAL_TABLET | ORAL | Status: DC
  Filled 2014-02-19: qty 0.5

## 2014-02-19 MED ORDER — SODIUM CHLORIDE 0.9 % IR SOLN
Status: DC | PRN
  Administered 2014-02-19: 10:00:00

## 2014-02-19 MED ORDER — GATIFLOXACIN 0.5 % OP SOLN: 1.0000 [drp] | Freq: Four times a day (QID) | OPHTHALMIC | Status: DC

## 2014-02-19 MED ORDER — PROPOFOL 10 MG/ML IV BOLUS
INTRAVENOUS | Status: DC | PRN
  Administered 2014-02-19: 10 mg via INTRAVENOUS
  Administered 2014-02-19: 110 mg via INTRAVENOUS
  Administered 2014-02-19: 10 mg via INTRAVENOUS
  Administered 2014-02-19: 20 mg via INTRAVENOUS

## 2014-02-19 MED ORDER — DOCUSATE SODIUM 100 MG PO CAPS
100.0000 mg | ORAL_CAPSULE | Freq: Every day | ORAL | Status: DC
  Administered 2014-02-20: 100 mg via ORAL
  Filled 2014-02-19: qty 1

## 2014-02-19 MED ORDER — ATORVASTATIN CALCIUM 40 MG PO TABS
40.0000 mg | ORAL_TABLET | Freq: Every day | ORAL | Status: DC
  Administered 2014-02-19 – 2014-02-20 (×2): 40 mg via ORAL
  Filled 2014-02-19 (×2): qty 1

## 2014-02-19 MED ORDER — THROMBIN 20000 UNITS EX SOLR
CUTANEOUS | Status: AC
  Filled 2014-02-19: qty 20000

## 2014-02-19 MED ORDER — FENTANYL CITRATE 0.05 MG/ML IJ SOLN
INTRAMUSCULAR | Status: DC | PRN
  Administered 2014-02-19: 25 ug via INTRAVENOUS
  Administered 2014-02-19: 100 ug via INTRAVENOUS
  Administered 2014-02-19: 25 ug via INTRAVENOUS
  Administered 2014-02-19: 50 ug via INTRAVENOUS

## 2014-02-19 MED ORDER — GUAIFENESIN-DM 100-10 MG/5ML PO SYRP: 15.0000 mL | ORAL_SOLUTION | ORAL | Status: DC | PRN

## 2014-02-19 MED ORDER — 0.9 % SODIUM CHLORIDE (POUR BTL) OPTIME
TOPICAL | Status: DC | PRN
  Administered 2014-02-19 (×2): 1000 mL

## 2014-02-19 MED ORDER — PROTAMINE SULFATE 10 MG/ML IV SOLN
INTRAVENOUS | Status: DC | PRN
  Administered 2014-02-19: 15 mg via INTRAVENOUS

## 2014-02-19 MED ORDER — LATANOPROST 0.005 % OP SOLN
1.0000 [drp] | Freq: Every day | OPHTHALMIC | Status: DC
  Administered 2014-02-19: 1 [drp] via OPHTHALMIC
  Filled 2014-02-19: qty 2.5

## 2014-02-19 MED ORDER — LACTATED RINGERS IV SOLN
INTRAVENOUS | Status: DC
  Administered 2014-02-19 (×2): via INTRAVENOUS

## 2014-02-19 MED ORDER — THROMBIN 20000 UNITS EX SOLR
CUTANEOUS | Status: DC | PRN
  Administered 2014-02-19: 11:00:00 via TOPICAL

## 2014-02-19 MED ORDER — LIDOCAINE HCL (PF) 1 % IJ SOLN
INTRAMUSCULAR | Status: AC
  Filled 2014-02-19: qty 30

## 2014-02-19 MED ORDER — GLYCOPYRROLATE 0.2 MG/ML IJ SOLN
INTRAMUSCULAR | Status: DC | PRN
  Administered 2014-02-19 (×2): 0.1 mg via INTRAVENOUS

## 2014-02-19 MED ORDER — SODIUM CHLORIDE 0.9 % IV SOLN
500.0000 mL | Freq: Once | INTRAVENOUS | Status: AC | PRN
Start: 2014-02-19 — End: 2014-02-19

## 2014-02-19 MED ORDER — LIDOCAINE HCL (CARDIAC) 20 MG/ML IV SOLN
INTRAVENOUS | Status: DC | PRN
  Administered 2014-02-19: 80 mg via INTRAVENOUS

## 2014-02-19 MED ORDER — FENTANYL CITRATE 0.05 MG/ML IJ SOLN: 25.0000 ug | INTRAMUSCULAR | Status: DC | PRN

## 2014-02-19 MED ORDER — DEXAMETHASONE SODIUM PHOSPHATE 10 MG/ML IJ SOLN
INTRAMUSCULAR | Status: DC | PRN
  Administered 2014-02-19: 10 mg via INTRAVENOUS

## 2014-02-19 MED ORDER — ASPIRIN EC 325 MG PO TBEC
325.0000 mg | DELAYED_RELEASE_TABLET | Freq: Every day | ORAL | Status: DC
  Administered 2014-02-19 – 2014-02-20 (×2): 325 mg via ORAL
  Filled 2014-02-19 (×2): qty 1

## 2014-02-19 MED ORDER — MAGNESIUM SULFATE 2 GM/50ML IV SOLN: 2.0000 g | Freq: Every day | INTRAVENOUS | Status: DC | PRN

## 2014-02-19 MED ORDER — MORPHINE SULFATE 2 MG/ML IJ SOLN: 2.0000 mg | INTRAMUSCULAR | Status: DC | PRN

## 2014-02-19 MED ORDER — SUCCINYLCHOLINE CHLORIDE 20 MG/ML IJ SOLN
INTRAMUSCULAR | Status: DC | PRN
  Administered 2014-02-19: 80 mg via INTRAVENOUS

## 2014-02-19 MED ORDER — ACETAMINOPHEN 325 MG PO TABS: 325.0000 mg | ORAL_TABLET | ORAL | Status: DC | PRN

## 2014-02-19 MED ORDER — METOPROLOL TARTRATE 1 MG/ML IV SOLN: 2.0000 mg | INTRAVENOUS | Status: DC | PRN

## 2014-02-19 MED ORDER — OXYCODONE HCL 5 MG PO TABS
5.0000 mg | ORAL_TABLET | Freq: Four times a day (QID) | ORAL | Status: DC | PRN
  Administered 2014-02-19: 5 mg via ORAL
  Filled 2014-02-19: qty 1

## 2014-02-19 MED ORDER — LABETALOL HCL 5 MG/ML IV SOLN
10.0000 mg | INTRAVENOUS | Status: DC | PRN
  Filled 2014-02-19: qty 4

## 2014-02-19 SURGICAL SUPPLY — 49 items
BAG DECANTER FOR FLEXI CONT (MISCELLANEOUS) ×2 IMPLANT
BLADE SURG 10 STRL SS (BLADE) IMPLANT
CANISTER SUCTION 2500CC (MISCELLANEOUS) ×2 IMPLANT
CATH ROBINSON RED A/P 18FR (CATHETERS) ×2 IMPLANT
CATH SUCT 10FR WHISTLE TIP (CATHETERS) ×2 IMPLANT
CLIP TI MEDIUM 6 (CLIP) ×2 IMPLANT
CLIP TI WIDE RED SMALL 6 (CLIP) ×2 IMPLANT
COVER PROBE W GEL 5X96 (DRAPES) ×2 IMPLANT
CRADLE DONUT ADULT HEAD (MISCELLANEOUS) ×2 IMPLANT
ELECT REM PT RETURN 9FT ADLT (ELECTROSURGICAL) ×2
ELECTRODE REM PT RTRN 9FT ADLT (ELECTROSURGICAL) ×1 IMPLANT
GAUZE SPONGE 2X2 8PLY STRL LF (GAUZE/BANDAGES/DRESSINGS) ×1 IMPLANT
GAUZE SPONGE 4X4 12PLY STRL (GAUZE/BANDAGES/DRESSINGS) IMPLANT
GLOVE BIO SURGEON STRL SZ7 (GLOVE) ×2 IMPLANT
GLOVE BIOGEL PI IND STRL 7.5 (GLOVE) ×1 IMPLANT
GLOVE BIOGEL PI INDICATOR 7.5 (GLOVE) ×1
GOWN STRL REUS W/ TWL LRG LVL3 (GOWN DISPOSABLE) ×3 IMPLANT
GOWN STRL REUS W/TWL LRG LVL3 (GOWN DISPOSABLE) ×3
IV ADAPTER SYR DOUBLE MALE LL (MISCELLANEOUS) ×2 IMPLANT
KIT BASIN OR (CUSTOM PROCEDURE TRAY) ×2 IMPLANT
KIT ROOM TURNOVER OR (KITS) ×2 IMPLANT
LIQUID BAND (GAUZE/BANDAGES/DRESSINGS) ×2 IMPLANT
NS IRRIG 1000ML POUR BTL (IV SOLUTION) ×4 IMPLANT
PACK CAROTID (CUSTOM PROCEDURE TRAY) ×2 IMPLANT
PAD ARMBOARD 7.5X6 YLW CONV (MISCELLANEOUS) ×4 IMPLANT
PATCH VASCULAR VASCU GUARD 1X6 (Vascular Products) ×2 IMPLANT
PROBE PENCIL 8 MHZ STRL DISP (MISCELLANEOUS) ×2 IMPLANT
SET COLLECT BLD 21X3/4 12 PB (MISCELLANEOUS) ×2 IMPLANT
SHUNT CAROTID BYPASS 10 (VASCULAR PRODUCTS) ×2 IMPLANT
SHUNT CAROTID BYPASS 12FRX15.5 (VASCULAR PRODUCTS) IMPLANT
SPONGE GAUZE 2X2 STER 10/PKG (GAUZE/BANDAGES/DRESSINGS) ×1
SPONGE INTESTINAL PEANUT (DISPOSABLE) ×2 IMPLANT
SPONGE SURGIFOAM ABS GEL 100 (HEMOSTASIS) ×2 IMPLANT
STOPCOCK 4 WAY LG BORE MALE ST (IV SETS) ×2 IMPLANT
SUT ETHILON 3 0 PS 1 (SUTURE) IMPLANT
SUT ETHILON 4 0 PS 2 18 (SUTURE) ×2 IMPLANT
SUT MNCRL AB 4-0 PS2 18 (SUTURE) ×2 IMPLANT
SUT PROLENE 6 0 BV (SUTURE) ×4 IMPLANT
SUT PROLENE 7 0 BV 1 (SUTURE) IMPLANT
SUT SILK 3 0 TIES 17X18 (SUTURE)
SUT SILK 3-0 18XBRD TIE BLK (SUTURE) IMPLANT
SUT VIC AB 3-0 SH 27 (SUTURE) ×1
SUT VIC AB 3-0 SH 27X BRD (SUTURE) ×1 IMPLANT
SYR TB 1ML LUER SLIP (SYRINGE) IMPLANT
SYSTEM CHEST DRAIN TLS 7FR (DRAIN) ×2 IMPLANT
TAPE CLOTH SURG 4X10 WHT LF (GAUZE/BANDAGES/DRESSINGS) ×2 IMPLANT
TUBING ART PRESS 48 MALE/FEM (TUBING) ×2 IMPLANT
TUBING EXTENTION W/L.L. (IV SETS) ×2 IMPLANT
WATER STERILE IRR 1000ML POUR (IV SOLUTION) ×2 IMPLANT

## 2014-02-19 NOTE — Anesthesia Procedure Notes (Signed)
Procedure Name: Intubation Date/Time: 02/19/2014 9:24 AM Performed by: Carola Frost Pre-anesthesia Checklist: Patient identified, Timeout performed, Emergency Drugs available, Suction available and Patient being monitored Patient Re-evaluated:Patient Re-evaluated prior to inductionOxygen Delivery Method: Circle system utilized Preoxygenation: Pre-oxygenation with 100% oxygen Intubation Type: IV induction and Cricoid Pressure applied Ventilation: Mask ventilation without difficulty Laryngoscope Size: Mac and 3 Grade View: Grade III Tube type: Oral Tube size: 7.0 mm Number of attempts: 1 Airway Equipment and Method: Stylet Placement Confirmation: CO2 detector,  positive ETCO2,  ETT inserted through vocal cords under direct vision and breath sounds checked- equal and bilateral Secured at: 21 cm Tube secured with: Tape Dental Injury: Teeth and Oropharynx as per pre-operative assessment

## 2014-02-19 NOTE — Transfer of Care (Signed)
Immediate Anesthesia Transfer of Care Note  Patient: Jenna Ramos  Procedure(s) Performed: Procedure(s): ENDARTERECTOMY CAROTID (Left)  Patient Location: PACU  Anesthesia Type:General  Level of Consciousness: awake, alert  and oriented  Airway & Oxygen Therapy: Patient Spontanous Breathing and Patient connected to nasal cannula oxygen  Post-op Assessment: Report given to PACU RN, Post -op Vital signs reviewed and stable and Patient moving all extremities X 4  Post vital signs: Reviewed and stable  Complications: No apparent anesthesia complications

## 2014-02-19 NOTE — H&P (View-Only) (Signed)
Established Carotid Patient  History of Present Illness  Jenna Ramos is a 80 y.o. (16-Jun-1928) female who presents with chief complaint: follow from cardiac clearance.  Previous carotid studies demonstrated: RICA <93 stenosis, LICA 81-82% stenosis. The patient was recently admitted locally for presumed TIA with receptive aphasia. The patient previously has had an episode of sensation of "head floating away" with inability to write for several days. This resolved after a few days. The patient has never had amaurosis fugax or monocular blindness. The patient has never had facial drooping or hemiplegia. The patient has never had expressive aphasia. The patient's previous neurologic deficits have resolved. The patient's risks factors for carotid disease include: hyperlipidemia, HTN.  Her recent cardiac clearance was deemed low risk due to a negative stress test per Dr. Almira Coaster ([place]).  The patient's previous neurologic deficits have resolved.  Past Medical History  Diagnosis Date  . Rectal incontinence   . Hypertension   . Arthritis   . Hypothyroidism   . Anemia   . Fibromyalgia   . Glaucoma   . Macular degeneration   . HOH (hard of hearing)   . Stroke     Past Surgical History  Procedure Laterality Date  . Tubal ligation    . Appendectomy      History   Social History  . Marital Status: Widowed    Spouse Name: N/A    Number of Children: N/A  . Years of Education: N/A   Occupational History  . Not on file.   Social History Main Topics  . Smoking status: Never Smoker   . Smokeless tobacco: Never Used  . Alcohol Use: No  . Drug Use: No  . Sexual Activity: Not on file   Other Topics Concern  . Not on file   Social History Narrative    Family History  Problem Relation Age of Onset  . Prostate cancer Father   . Cancer Father   . Lung disease Sister   . Cancer Brother     Current Outpatient Prescriptions on File Prior  to Visit  Medication Sig Dispense Refill  . atorvastatin (LIPITOR) 40 MG tablet Take 40 mg by mouth daily.    . benazepril (LOTENSIN) 10 MG tablet Take 1 tablet (10 mg total) by mouth daily. 30 tablet 1  . BESIVANCE 0.6 % SUSP     . bimatoprost (LUMIGAN) 0.03 % ophthalmic solution Place 1 drop into both eyes at bedtime.    . Calcium Carbonate-Vitamin D (CALCIUM 600/VITAMIN D) 600-400 MG-UNIT per tablet Take 1 tablet by mouth daily.    . cephALEXin (KEFLEX) 500 MG capsule Take 1 capsule (500 mg total) by mouth 3 (three) times daily. 21 capsule 0  . cyclobenzaprine (FLEXERIL) 10 MG tablet     . FLUZONE HIGH-DOSE 0.5 ML SUSY Inject 1 application into the muscle once.    . gabapentin (NEURONTIN) 100 MG capsule Take 2 capsules (200 mg total) by mouth 3 (three) times daily. For fibromyalgia 180 capsule 1  . HYDROcodone-acetaminophen (NORCO/VICODIN) 5-325 MG per tablet Take 1-2 tablets by mouth every 4 (four) hours as needed. 16 tablet 0  . iron polysaccharides (NIFEREX) 150 MG capsule Take 1 capsule (150 mg total) by mouth 2 (two) times daily before lunch and supper. 60 capsule 1  . levothyroxine (SYNTHROID, LEVOTHROID) 88 MCG tablet Take 44-88 mcg by mouth daily. 1 daily Mon-Fri and 1/2 tab on Sat-Sun    . LUMIGAN 0.01 % SOLN     .  Menthol-Methyl Salicylate (MUSCLE RUB) 10-15 % CREA Apply 1 application topically 3 (three) times daily. To shoulders/neck  0  . methocarbamol (ROBAXIN) 750 MG tablet Take 1 tablet (750 mg total) by mouth every 6 (six) hours as needed for muscle spasms. 90 tablet 1  . Multiple Vitamins-Minerals (MULTIVITAMINS THER. W/MINERALS) TABS Take 1 tablet by mouth daily.    . Multiple Vitamins-Minerals (PRESERVISION AREDS 2 PO) Take 1 capsule by mouth 2 (two) times daily.    . Omega-3 Fatty Acids (FISH OIL) 1200 MG CAPS Take 1 capsule by mouth 3 (three) times daily with meals.    Marland Kitchen oxyCODONE (OXY IR/ROXICODONE) 5 MG immediate release tablet Take 1 tablet (5 mg total) by mouth every 6  (six) hours as needed for severe pain. 100 tablet 0  . polyethylene glycol (MIRALAX / GLYCOLAX) packet Take 17 g by mouth daily. 14 each 0  . senna-docusate (SENOKOT-S) 8.6-50 MG per tablet Take 1 tablet by mouth at bedtime.    . traMADol (ULTRAM) 50 MG tablet Take 1 tablet (50 mg total) by mouth every 6 (six) hours as needed for moderate pain. 60 tablet 0   No current facility-administered medications on file prior to visit.    Allergies  Allergen Reactions  . Aggrenox [Aspirin-Dipyridamole Er] Anaphylaxis    Severe headache  . Codeine Other (See Comments)    Feet burn and agitation  . Morphine And Related Other (See Comments)    Feet burn and agitation   REVIEW OF SYSTEMS: (Positives checked otherwise negative)  CARDIOVASCULAR: []  chest pain, []  chest pressure, []  palpitations, []  shortness of breath when laying flat, []  shortness of breath with exertion, []  pain in feet when walking, []  pain in feet when laying flat, []  history of blood clot in veins (DVT), []  history of phlebitis, []  swelling in legs, []  varicose veins  PULMONARY: []  productive cough, []  asthma, []  wheezing  NEUROLOGIC: []  weakness in arms or legs, []  numbness in arms or legs, [x]  difficulty speaking or slurred speech, []  temporary loss of vision in one eye, [x]  dizziness  HEMATOLOGIC: []  bleeding problems, []  problems with blood clotting too easily  MUSCULOSKEL: []  joint pain, []  joint swelling  GASTROINTEST: []  vomiting blood, []  blood in stool   GENITOURINARY: []  burning with urination, []  blood in urine  PSYCHIATRIC: []  history of major depression  INTEGUMENTARY: []  rashes, []  ulcers  CONSTITUTIONAL: []  fever, []  chills   Physical Examination  Filed Vitals:   02/13/14 1446 02/13/14 1448  BP: 161/57 156/56  Pulse: 76   Height: 5\' 3"  (1.6 m)   Weight: 123 lb 8 oz (56.019 kg)   SpO2: 99%    Body mass index is 21.88 kg/(m^2).  General: A&O x 3, WD, elderly  Head:  [place]/AT  Ear/Nose/Throat: Hearing grossly intact, nares w/o erythema or drainage, oropharynx w/o Erythema/Exudate, Mallampati score: 3  Eyes: PERRLA, EOMI, Post surg chg to lenses  Neck: Supple, no nuchal rigidity, no palpable LAD  Pulmonary: Sym exp, good air movt, CTAB, no rales, rhonchi, & wheezing  Cardiac: RRR, Nl S1, S2, no Murmurs, rubs or gallops  Vascular: Vessel Right Left  Radial Palpable Palpable  Brachial Palpable Palpable  Carotid Palpable, without bruit Palpable, without bruit  Aorta Not palpable N/A  Femoral Palpable Palpable  Popliteal Palpable Not palpable  PT Palpable Palpable  DP Palpable Palpable   Gastrointestinal: soft, NTND, -G/R, - HSM, - masses, - CVAT B  Musculoskeletal: M/S 5/5 grossly throughout , somewhat limited exam due  to recent L hip and L wrist surgery, Extremities without ischemic changes , B distal arm edema, TTP L wrist  Neurologic: CN 2-12 intact , Pain and light touch intact in extremities , Motor exam as listed above  Psychiatric: Judgment intact, Mood & affect appropriate for pt's clinical situation  Dermatologic: See M/S exam for extremity exam, no rashes otherwise noted  Lymph : No Cervical, Axillary, or Inguinal lymphadenopathy     Medical Decision Making  Jenna Ramos is a 80 y.o. female who presents with: asx RICA stenosis <50%, sx L ICA stenosis >70%   Based on the patient's vascular studies and examination, I have offered the patient: L CEA. I discussed with the patient the risks, benefits, and alternatives to carotid endarterectomy.   The patient is not a good candidate for carotid artery stenting based on CREST. I discussed the procedural details of carotid endarterectomy with the patient.  The patient is aware that the risks of carotid endarterectomy include but are not limited to: bleeding, infection, stroke, myocardial infarction, death, cranial nerve injuries both temporary and  permanent, neck hematoma, possible airway compromise, labile blood pressure post-operatively, cerebral hyperperfusion syndrome, and possible need for additional interventions in the future.  The patient is aware of the risks and agrees to proceed forward with the procedure.  I discussed in depth with the patient the nature of atherosclerosis, and emphasized the importance of maximal medical management including strict control of blood pressure, blood glucose, and lipid levels, antiplatelet agents, obtaining regular exercise, and cessation of smoking.    The patient is aware that without maximal medical management the underlying atherosclerotic disease process will progress, limiting the benefit of any interventions. The patient is currently on a statin: Lipitor.   The patient is currently on an anti-platelet: ASA.  Thank you for allowing [place] to participate in this patient's care.  Adele Barthel, MD [facility] Office: [phone] Pager: [phone]  02/13/2014, 4:38 PM

## 2014-02-19 NOTE — Interval H&P Note (Signed)
[facility]  History and Physical Update  The patient was interviewed and re-examined.  The patient's previous History and Physical has been reviewed and is unchanged from my consult.  There is no change in the plan of care: L CEA.  I discussed with the patient the risks, benefits, and alternatives to carotid endarterectomy.  The patient is not a candidate for carotid artery stenting. I discussed the procedural details of carotid endarterectomy with the patient.  The patient is aware that the risks of carotid endarterectomy include but are not limited to: bleeding, infection, stroke, myocardial infarction, death, cranial nerve injuries both temporary and permanent, neck hematoma, possible airway compromise, labile blood pressure post-operatively, cerebral hyperperfusion syndrome, and possible need for additional interventions in the future. The patient is aware of the risks and agrees to proceed forward with the procedure.   Adele Barthel, MD [facility] Office: [phone] Pager: [phone]  02/19/2014, 7:17 AM

## 2014-02-19 NOTE — Anesthesia Postprocedure Evaluation (Signed)
  Anesthesia Post-op Note  Patient: Jenna Ramos  Procedure(s) Performed: Procedure(s) (LRB): ENDARTERECTOMY CAROTID (Left)  Patient Location: PACU  Anesthesia Type: General  Level of Consciousness: awake and alert   Airway and Oxygen Therapy: Patient Spontanous Breathing  Post-op Pain: mild  Post-op Assessment: Post-op Vital signs reviewed, Patient's Cardiovascular Status Stable, Respiratory Function Stable, Patent Airway and No signs of Nausea or vomiting  Last Vitals:  Filed Vitals:   02/19/14 1200  BP: 147/55  Pulse: 69  Temp: 37.1 C  Resp: 12    Post-op Vital Signs: stable   Complications: No apparent anesthesia complications

## 2014-02-19 NOTE — Anesthesia Preprocedure Evaluation (Addendum)
Anesthesia Evaluation  Patient identified by MRN, date of birth, ID band Patient awake    Reviewed: Allergy & Precautions, H&P , NPO status , Patient's Chart, lab work & pertinent test results  Airway Mallampati: II  TM Distance: >3 FB Neck ROM: Full    Dental no notable dental hx.    Pulmonary neg pulmonary ROS, former smoker,  breath sounds clear to auscultation  Pulmonary exam normal       Cardiovascular hypertension, Pt. on medications + Peripheral Vascular Disease Rhythm:Regular Rate:Normal     Neuro/Psych negative neurological ROS  negative psych ROS   GI/Hepatic negative GI ROS, Neg liver ROS,   Endo/Other  Hypothyroidism   Renal/GU negative Renal ROS  negative genitourinary   Musculoskeletal negative musculoskeletal ROS (+)   Abdominal   Peds negative pediatric ROS (+)  Hematology  (+) anemia ,   Anesthesia Other Findings   Reproductive/Obstetrics negative OB ROS                            Anesthesia Physical Anesthesia Plan  ASA: III  Anesthesia Plan: General   Post-op Pain Management:    Induction: Intravenous  Airway Management Planned: Oral ETT  Additional Equipment: Arterial line  Intra-op Plan:   Post-operative Plan: Extubation in OR  Informed Consent: I have reviewed the patients History and Physical, chart, labs and discussed the procedure including the risks, benefits and alternatives for the proposed anesthesia with the patient or authorized representative who has indicated his/her understanding and acceptance.   Dental advisory given  Plan Discussed with: CRNA  Anesthesia Plan Comments:         Anesthesia Quick Evaluation

## 2014-02-19 NOTE — Op Note (Signed)
OPERATIVE NOTE  PROCEDURE:   1.  left carotid endarterectomy with bovine patch angioplasty 2.  left intraoperative carotid ultrasound  PRE-OPERATIVE DIAGNOSIS: left symptomatic carotid stenosis >70 %  POST-OPERATIVE DIAGNOSIS: same as above   SURGEON: Adele Barthel, MD  ASSISTANT(S): Dr. Kellie Simmering; Gerri Lins, [facility]   ANESTHESIA: general  ESTIMATED BLOOD LOSS: 100 cc  FINDING(S): 1.  Continuous doppler audible flow signatures are appropriate for each carotid artery. 2.  No evidence of intimal flap visualized on transverse or longitudinal ultrasonography. 3.  Calcific carotid plaque.  SPECIMEN(S):  Carotid plaque (sent to Pathology)  INDICATIONS:   Jenna Ramos is a 80 y.o. female who presents with left symptomatic carotid stenosis >70%.  I discussed with the patient the risks, benefits, and alternatives to carotid endarterectomy.  I discussed the procedural details of carotid endarterectomy with the patient.  The patient is aware that the risks of carotid endarterectomy include but are not limited to: bleeding, infection, stroke, myocardial infarction, death, cranial nerve injuries both temporary and permanent, neck hematoma, possible airway compromise, labile blood pressure post-operatively, cerebral hyperperfusion syndrome, and possible need for additional interventions in the future. The patient is aware of the risks and agrees to proceed forward with the procedure.  DESCRIPTION: After full informed written consent was obtained from the patient, the patient was brought back to the operating room and placed supine upon the operating table.  Prior to induction, the patient received IV antibiotics.  After obtaining adequate anesthesia, the patient was placed into semi-Fowler position with a shoulder roll in place and the patient's neck slightly hyperextended and rotated away from the surgical site.  The patient was prepped in the standard fashion for a left carotid  endarterectomy.  I made an incision anterior to the sternocleidomastoid muscle and dissected down through the subcutaneous tissue.  The platysmas was opened with electrocautery.  Then I dissected down to the internal jugular vein.  This was dissected posteriorly until I obtained visualization of the common carotid artery.  This was dissected out and then an umbilical tape was placed around the common carotid artery and I loosely applied a Rumel tourniquet.  I then dissected in a periadventitial fashion along the common carotid artery up to the bifurcation.  I then identified the external carotid artery and the superior thyroid artery.  A 2-0 silk tie was looped around the superior thyroid artery, and I also dissected out the external carotid artery and placed a vessel loop around it.  In continuing the dissection to the internal carotid artery, I identified the facial vein.  This was ligated and then transected, giving me improved exposure of the internal carotid artery.  In the process of this dissection, the hypoglossal nerve was identified.  I then dissected out the internal carotid artery until I identified an area of soft tissue in the internal carotid artery.  Due the angulation of the distal internal carotid artery under the hypoglossal nerve, I did not think a Rumel tourniquet could be applied easily to the internal carotid artery.   I dissected slightly distal to relatively disease free portion of the internal carotid artery and placed a vessel loop around the artery.  At this point, we gave the patient a therapeutic bolus of Heparin intravenously (roughly 80 units/kg).  After waiting 3 minutes, then I clamped the external carotid artery and then the common carotid artery.  Using a butterfly needle connected to the arterial pressure circuit, I cannulated the common carotid artery distal to  the clamp.  The stump pressure was measured at: 110 mm Hg.  Based on this measurement, I felt no shunt was needed.  I  then made an arteriotomy in the common carotid artery with a 11 blade, and extended the arteriotomy with a Potts scissor down into the common carotid artery, then I carried the arteriotomy through the bifurcation into the internal carotid artery until I reached an area that was not diseased.  At this point, I started the endarterectomy in the common carotid artery with a Technical brewer and carried this dissection down into the common carotid artery circumferentially.  Then I transected the plaque at a segment where it was adherent.  I then carried this dissection up into the external carotid artery.  The plaque was extracted by unclamping the external carotid artery and everting the artery.  The dissection was then carried into the internal carotid artery, extracting the remaining portion of the carotid plaque.  I passed the plaque off the field as a specimen.  I then spent the next 30 minutes removing intimal flaps and loose debris.  Eventually I reached the point where the residual plaque was densely adherent and any further dissection would compromise the integrity of the wall.  After verifying that there was no more loose intimal flaps or debris, I re-interrogated the entirety of this carotid artery.  At this point, I was satisfied that the minimal remaining disease was densely adherent to the wall and wall integrity was intact.  At this point, I then fashioned a bovine pericardial patch for the geometry of this artery and sewed it in place with two running stitch of 6-0 Prolene, one from each end.  Prior to completing this patch angioplasty, I backbled the external carotid artery and then reclamped it.  Back bleeding was: vigorous.   I then backbled the internal carotid artery and then reclamped it.  Back bleeding was: pulsatile and excellent.   Finally, I backbled the common carotid artery and then reclamped it.  Back bleeding was: pulsatile and excellent.   I completed the patch angioplasty in the usual  fashion.  First, I released the clamp on the external carotid artery, then I released it on the common carotid artery.  After waiting a few seconds, I then released it on the internal carotid artery.  I then interrogated this patient's arteries with the continuous Doppler.  The audible waveforms in each artery were consistent with the expected characteristics for each artery.  The Sonosite probe was then sterilely draped and used to interrogate the carotid artery in both longitudinal and transverse views.  At this point, I washed out the wound, and placed thrombin and Gelfoam throughout.  I also gave the patient 30 mg of protamine to reverse his anticoagulation.   After waiting a few minutes, I removed the thrombin and Gelfoam and washed out the wound.  There was no more active bleeding in the surgical site.   I then reapproximated the platysma muscle with a running stitch of 3-0 Vicryl.  The skin was then reapproximated with a running subcuticular 4-0 Monocryl stitch.  The skin was then cleaned, dried and Dermabond was used to reinforce the skin closure.  The patient woke without any problems, neurologically intact.     COMPLICATIONS: none  CONDITION: stable  Adele Barthel, MD [facility] [place] Office: [phone] Pager: [phone]  02/19/2014, 11:32 AM

## 2014-02-20 ENCOUNTER — Telehealth: Payer: Self-pay | Admitting: Vascular Surgery

## 2014-02-20 ENCOUNTER — Encounter (HOSPITAL_COMMUNITY): Payer: Self-pay | Admitting: Vascular Surgery

## 2014-02-20 LAB — BASIC METABOLIC PANEL
Anion gap: 9 (ref 5–15)
BUN: 14 mg/dL (ref 6–23)
CALCIUM: 8.8 mg/dL (ref 8.4–10.5)
CO2: 28 mEq/L (ref 19–32)
CREATININE: 0.55 mg/dL (ref 0.50–1.10)
Chloride: 107 mEq/L (ref 96–112)
GFR, EST NON AFRICAN AMERICAN: 84 mL/min — AB (ref >90)
Glucose, Bld: 100 mg/dL — ABNORMAL HIGH (ref 70–99)
Potassium: 4 mEq/L (ref 3.7–5.3)
Sodium: 144 mEq/L (ref 137–147)

## 2014-02-20 LAB — CBC
HEMATOCRIT: 27.9 % — AB (ref 36.0–46.0)
Hemoglobin: 8.6 g/dL — ABNORMAL LOW (ref 12.0–15.0)
MCH: 22.7 pg — AB (ref 26.0–34.0)
MCHC: 30.8 g/dL (ref 30.0–36.0)
MCV: 73.6 fL — AB (ref 78.0–100.0)
Platelets: 290 10*3/uL (ref 150–400)
RBC: 3.79 MIL/uL — ABNORMAL LOW (ref 3.87–5.11)
RDW: 18.5 % — AB (ref 11.5–15.5)
WBC: 9 10*3/uL (ref 4.0–10.5)

## 2014-02-20 MED ORDER — OXYCODONE HCL 5 MG PO TABS: 5.0000 mg | ORAL_TABLET | Freq: Four times a day (QID) | ORAL | Status: DC | PRN

## 2014-02-20 NOTE — Telephone Encounter (Signed)
Spoke with pts daughter, dpm °

## 2014-02-20 NOTE — Progress Notes (Signed)
Nutrition Brief Note  Patient identified on the Malnutrition Screening Tool (MST) Report. She says that she lost some weight, but weight has been trending up over the past month. PO intake is good.  Wt Readings from Last 15 Encounters:  02/19/14 128 lb 8.5 oz (58.3 kg)  02/18/14 121 lb 11.1 oz (55.2 kg)  02/13/14 123 lb 8 oz (56.019 kg)  01/23/14 123 lb (55.792 kg)  01/14/14 132 lb (59.875 kg)  01/06/14 132 lb (59.875 kg)  10/29/13 132 lb 4.8 oz (60.011 kg)  10/17/13 131 lb (59.421 kg)    Body mass index is 22.77 kg/(m^2). Patient meets criteria for normal weight based on current BMI.   Current diet order is heart healthy, patient is eating well at this time. Labs and medications reviewed.   No nutrition interventions warranted at this time. If nutrition issues arise, please consult RD.    Molli Barrows, RD, LDN, [place] Pager [phone] After Hours Pager [phone]

## 2014-02-20 NOTE — Progress Notes (Signed)
Utilization review completed. Malkie Wille, RN, BSN. 

## 2014-02-20 NOTE — Progress Notes (Signed)
Discharge instructions given to patient and caregiver/daughter in law. Questions answered. Educated to watch for signs of infection and stroke. PIV DC, hemostasis achieved. VSS. eICU and CCMT notified of DC. Pt escorted to private vehicle via wheelchair by RN. Rx sent with patient. All belongings sent home with patient.

## 2014-02-20 NOTE — Telephone Encounter (Signed)
-----   Message from Mena Goes, RN sent at 02/20/2014  9:34 AM EST ----- Regarding: Schedule   ----- Message -----    From: Alvia Grove, PA-C    Sent: 02/20/2014   7:18 AM      To: [facility] Charge Pool  S/p left CEA 02/20/14  F/u with Dr. Bridgett Larsson in 2 weeks.  Thanks, Maudie Mercury

## 2014-02-20 NOTE — Progress Notes (Signed)
  [facility] Progress Note  02/20/2014 7:42 AM   POD #1 s/p L CEA  While speaking with patient at bedside, patient says that I started to appear "blurry." Both eyes were blurry. She also described her head feeling "funny" and could not be anymore specific. Her neuro exam was intact. Her BP was elevated 169/60.  She notes only minor discomfort in the left occipital region.  She said she was afraid of having a "seizure." Her vision resolved after about 3 mins.   -Keep SBP <150 -Frequent neuro checks. -Will continue to monitor. If no further episodes, discharge later today.    Virgina Jock, PA-C [facility] Office: [phone] Pager: [phone] 02/20/2014 7:42 AM

## 2014-02-20 NOTE — Progress Notes (Signed)
   Daily Progress Note  Assessment/Planning: POD #1 s/p L CEA   Stable HD: signs of cerebral hyperperfusion, doubt it will happen as the patient's carotid was not near occluded  Intact Neuro: no post-op CVA  Minimal drain output: d/c drain  Decreased H/H: suspect lab error or preop hemoconcentration as minimal blood loss intraop, no hematoma at incision site and minimal drain output  Ok to d/c once off oxygen  Subjective  - 1 Day Post-Op  No complaints  Objective Filed Vitals:   02/19/14 2115 02/19/14 2349 02/19/14 2350 02/20/14 0345  BP: 143/55  135/52 147/51  Pulse: 76  65 69  Temp: 98.1 F (36.7 C) 98.3 F (36.8 C)  97.5 F (36.4 C)  TempSrc: Oral Oral  Oral  Resp: 12  17 24   Height: 5\' 3"  (1.6 m)     Weight: 128 lb 8.5 oz (58.3 kg)     SpO2: 98%  100% 99%    Intake/Output Summary (Last 24 hours) at 02/20/14 1761 Last data filed at 02/20/14 6073  Gross per 24 hour  Intake   2175 ml  Output   1755 ml  Net    420 ml    PULM  CTAB CV  RRR GI  soft, NTND VASC  L neck incision c/d/i, no hematoma, min drainage in TLS (5 mL overnight) NEURO Motor sym 5/5, tongue midline, no aphasia  Laboratory CBC    Component Value Date/Time   WBC 9.0 02/20/2014 0345   HGB 8.6* 02/20/2014 0345   HCT 27.9* 02/20/2014 0345   PLT 290 02/20/2014 0345    BMET    Component Value Date/Time   NA 144 02/20/2014 0345   NA 137 10/24/2010   K 4.0 02/20/2014 0345   CL 107 02/20/2014 0345   CO2 28 02/20/2014 0345   GLUCOSE 100* 02/20/2014 0345   BUN 14 02/20/2014 0345   CREATININE 0.55 02/20/2014 0345   CALCIUM 8.8 02/20/2014 0345   GFRNONAA 84* 02/20/2014 0345   GFRAA >90 02/20/2014 0345    Adele Barthel, MD [facility] [place]: [phone] Pager: [phone]  02/20/2014, 7:02 AM

## 2014-02-23 ENCOUNTER — Encounter (INDEPENDENT_AMBULATORY_CARE_PROVIDER_SITE_OTHER): Payer: BC Managed Care – PPO | Admitting: Ophthalmology

## 2014-02-23 DIAGNOSIS — H35033 Hypertensive retinopathy, bilateral: Secondary | ICD-10-CM | POA: Diagnosis not present

## 2014-02-23 DIAGNOSIS — H43813 Vitreous degeneration, bilateral: Secondary | ICD-10-CM | POA: Diagnosis not present

## 2014-02-23 DIAGNOSIS — I10 Essential (primary) hypertension: Secondary | ICD-10-CM | POA: Diagnosis not present

## 2014-02-23 DIAGNOSIS — H3532 Exudative age-related macular degeneration: Secondary | ICD-10-CM

## 2014-02-23 NOTE — Discharge Summary (Signed)
[facility] Discharge Summary  Jenna Ramos [birth year] 80 y.o. female  [id number]  Admission Date: 02/19/2014  Discharge Date: 02/23/2014  Physician: Adele Barthel, MD  Admission Diagnosis: Left internal carotid artery stenosis I65.22   HPI:   This is a 80 y.o. female who was recently admitted locally for for presumed TIA with receptive aphasia. Previous carotid studies demonstrated: RICA <19 stenosis, LICA 14-78% stenosis. The patient previously has had an episode of sensation of "head floating away" with inability to write for several days.This resolved after a few days.The patient has never had amaurosis fugax or monocular blindness.The patient has never had facial drooping or hemiplegia.The patient has never had expressive aphasia.The patient's previous neurologic deficits have resolved.The patient's risks factors for carotid disease include: hyperlipidemia, HTN.Her recent cardiac clearance was deemed low risk due to a negative stress test per Dr. Almira Coaster ([place]).The patient's previous neurologic deficits have resolved.  Hospital Course:  The patient was admitted to the hospital and taken to the operating room on 02/19/2014 and underwent left carotid endarterectomy.  The patient tolerated the procedure well and was transported to the PACU in stable condition.   By POD 1, the patient's neuro status was intact. She had some signs of cerebral hyperperfusion but was not suspected to happen due to the patient's carotid not being nearly occluded. Her left neck incision was healing well without hematoma. Her drain was discontinued. Also that morning she reported feeling "funny" and vision "getting blurry" that resolved after 10 minutes that. She remained neurologically intact. The patient later stated that she believed that was due wearing "wrong reading glasses." She was monitored and experienced no further vision episodes. She felt  better after eating breakfast. The patient was ambulated and weaned off oxygen. She was discharged home on POD 1 in good condition.   Discharge Instructions:   The patient is discharged to home with extensive instructions on wound care and progressive ambulation.  They are instructed not to drive or perform any heavy lifting until returning to see the physician in his office.  Discharge Instructions    CAROTID Sugery: Call MD for difficulty swallowing or speaking; weakness in arms or legs that is a new symtom; severe headache.  If you have increased swelling in the neck and/or  are having difficulty breathing, CALL [phone]    Complete by:  As directed      Call MD for:  redness, tenderness, or signs of infection (pain, swelling, bleeding, redness, odor or green/yellow discharge around incision site)    Complete by:  As directed      Call MD for:  severe or increased pain, loss or decreased feeling  in affected limb(s)    Complete by:  As directed      Call MD for:  temperature >100.5    Complete by:  As directed      Driving Restrictions    Complete by:  As directed   No driving for 2 weeks. After 2 weeks, no driving while on pain medication.     Increase activity slowly    Complete by:  As directed   Walk with assistance use walker or cane as needed     Lifting restrictions    Complete by:  As directed   No lifting for 1 week.     Resume previous diet    Complete by:  As directed      may wash over wound with mild soap and water  Complete by:  As directed   The skin glue will eventually peel off. Do not pull off glue.           Discharge Diagnosis:  Left internal carotid artery stenosis I65.22  Secondary Diagnosis: Patient Active Problem List   Diagnosis Date Noted  . TIA (transient ischemic attack) 01/23/2014  . Carotid stenosis 01/14/2014  . 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 Fibromyalgia muscle pain 10/28/2013  . Acute blood loss anemia  10/28/2013  . Hip fracture requiring operative repair 10/17/2013  . Anemia    Past Medical History  Diagnosis Date  . Rectal incontinence   . Arthritis   . Fibromyalgia   . Glaucoma     uses Eye Drops daily  . Macular degeneration     wet and gets injections in both eyes  . HOH (hard of hearing)   . Stroke   . Hyperlipidemia     takes Atorvastatin daily  . Hypertension     takes Benazepril daily  . Hypothyroidism     takes Synthroid daily  . Anemia     takes Iron pill daily  . Peripheral vascular disease   . Nocturia       Medication List    TAKE these medications        aspirin 325 MG EC tablet  Take 325 mg by mouth.     atorvastatin 40 MG tablet  Commonly known as:  LIPITOR  Take 40 mg by mouth daily.     benazepril 10 MG tablet  Commonly known as:  LOTENSIN  Take 1 tablet (10 mg total) by mouth daily.     BESIVANCE 0.6 % Susp  Generic drug:  Besifloxacin HCl  Place 1 drop into both eyes See admin instructions. Take day of eye shots and the day after     bimatoprost 0.03 % ophthalmic solution  Commonly known as:  LUMIGAN  Place 1 drop into both eyes at bedtime.     CALCIUM 600/VITAMIN D 600-400 MG-UNIT per tablet  Generic drug:  Calcium Carbonate-Vitamin D  Take 1 tablet by mouth 3 (three) times daily with meals.     Fish Oil 1200 MG Caps  Take 1 capsule by mouth 3 (three) times daily with meals.     FLUZONE HIGH-DOSE 0.5 ML Susy  Generic drug:  Influenza Vac Split High-Dose  Inject 1 application into the muscle once.     HYDROcodone-acetaminophen 5-325 MG per tablet  Commonly known as:  NORCO/VICODIN  Take 1-2 tablets by mouth every 4 (four) hours as needed.     iron polysaccharides 150 MG capsule  Commonly known as:  NIFEREX  Take 1 capsule (150 mg total) by mouth 2 (two) times daily before lunch and supper.     levothyroxine 88 MCG tablet  Commonly known as:  SYNTHROID, LEVOTHROID  Take 44-88 mcg by mouth daily. 1 daily Mon-Fri and 1/2 tab  on Sat-Sun     multivitamins ther. w/minerals Tabs tablet  Take 1 tablet by mouth daily.     PRESERVISION AREDS 2 PO  Take 1 capsule by mouth 2 (two) times daily.     MUSCLE RUB 10-15 % Crea  Apply 1 application topically 3 (three) times daily. To shoulders/neck     oxyCODONE 5 MG immediate release tablet  Commonly known as:  Oxy IR/ROXICODONE  Take 1 tablet (5 mg total) by mouth every 6 (six) hours as needed for severe pain.  Roxicodone #15 No Refill  Disposition: Home  Patient's condition: is Good  Follow up: 1. Dr. Bridgett Larsson in 2 weeks.   Virgina Jock, PA-C [facility] [phone]  Addendum  I have independently interviewed and examined the patient, and I agree with the physician assistant's discharge summary.  This patient underwent an uneventful left CEA with uneventful post-operative course.  The patient was discharged on POD #1.  He will follow up in the office in 2 weeks.  Adele Barthel, MD [facility] [place] Office: [phone] Pager: [phone]  03/02/2014, 7:42 AM    --- For VQI Registry use --- Instructions: Press F2 to tab through selections.  Delete question if not applicable.   Modified Rankin score at D/C (0-6): 0  IV medication needed for:  1. Hypertension: No 2. Hypotension: No  Post-op Complications: No  1. Post-op CVA or TIA: No  2. CN injury: No  3. Myocardial infarction: No  4.  CHF: No  5.  Dysrhythmia (new): No  6. Wound infection: No  7. Reperfusion symptoms: No  8. Return to OR: No  Discharge medications: Statin use:  Yes If No: [ ]  For Medical reasons, [ ]  Non-compliant, [ ]  Not-indicated ASA use:  Yes  If No: [ ]  For Medical reasons, [ ]  Non-compliant, [ ]  Not-indicated Beta blocker use:  No If No: [ ]  For Medical reasons, [ ]  Non-compliant, [x ] Not-indicated ACE-Inhibitor use:  Yes If No: [ ]  For Medical reasons, [ ]  Non-compliant, [ ]  Not-indicated P2Y12  Antagonist use: No, [ ]  Plavix, [ ]  Plasugrel, [ ]  Ticlopinine, [ ]  Ticagrelor, [ ]  Other, [x]  No for medical reason, [ ]  Non-compliant, [ ]  Not-indicated Anti-coagulant use:  No, [ ]  Warfarin, [ ]  Rivaroxaban, [ ]  Dabigatran, [ ]  Other, [ ]  No for medical reason, [ ]  Non-compliant, [x ] Not-indicated

## 2014-03-02 ENCOUNTER — Telehealth: Payer: Self-pay | Admitting: Family Medicine

## 2014-03-02 ENCOUNTER — Other Ambulatory Visit: Payer: Self-pay | Admitting: Family Medicine

## 2014-03-02 MED ORDER — BENAZEPRIL HCL 10 MG PO TABS: 10.0000 mg | ORAL_TABLET | Freq: Every day | ORAL | Status: DC

## 2014-03-02 NOTE — Telephone Encounter (Signed)
done

## 2014-03-11 ENCOUNTER — Encounter: Payer: Self-pay | Admitting: Family Medicine

## 2014-03-11 ENCOUNTER — Ambulatory Visit (INDEPENDENT_AMBULATORY_CARE_PROVIDER_SITE_OTHER): Payer: Federal, State, Local not specified - PPO | Admitting: Family Medicine

## 2014-03-11 VITALS — BP 208/79 | HR 76 | Temp 97.0°F | Ht 63.0 in | Wt 122.6 lb

## 2014-03-11 DIAGNOSIS — E785 Hyperlipidemia, unspecified: Secondary | ICD-10-CM

## 2014-03-11 DIAGNOSIS — H9193 Unspecified hearing loss, bilateral: Secondary | ICD-10-CM

## 2014-03-11 DIAGNOSIS — I10 Essential (primary) hypertension: Secondary | ICD-10-CM

## 2014-03-11 MED ORDER — ATORVASTATIN CALCIUM 40 MG PO TABS: 40.0000 mg | ORAL_TABLET | Freq: Every day | ORAL | Status: DC

## 2014-03-11 NOTE — Progress Notes (Signed)
   Subjective:    Patient ID: Jenna Ramos, female    DOB: [birth year], 81 y.o.   MRN: [id number]  HPI  delightful 82 year old first visit here. She is a widow and has lived in this area for about 15 years. Beginning in August 2015 she began having TIAs. Apparently had a fall and has had a fracture of her left wrist as well as left hip but then had what she describes as a stroke and September October of last year. She rehabbed in a skilled nursing facility for a while and is now back at home about 3 weeks ago she had a carotid endarterectomy and has not been released by the vascular surgeon from that procedure but does have an appointment with him in follow-up later this week. Medications are fairly minimal for her age. She is on benazepril for blood pressure, atorvastatin for lipids, and thyroid replacement. She does have a history of glaucoma as well as macular degeneration and is followed by 2 different ophthalmologists for those problems.    Review of Systems  Constitutional: Negative.   Respiratory: Negative.   Cardiovascular: Negative.   Gastrointestinal: Negative.   Genitourinary: Negative.   Neurological: Negative.   Psychiatric/Behavioral: Negative.        Objective:   Physical Exam  Constitutional: She is oriented to person, place, and time. She appears well-developed.  HENT:  Head: Normocephalic.  Eyes: Pupils are equal, round, and reactive to light.  Neck: Normal range of motion.  Cardiovascular: Normal rate and regular rhythm.   Continues with left carotid bruit  Pulmonary/Chest: Effort normal and breath sounds normal.  Abdominal: Soft. Bowel sounds are normal.  Neurological: She is alert and oriented to person, place, and time.  Cannot elicit reflex left patellar  Psychiatric: She has a normal mood and affect. Her behavior is normal. Thought content normal.   BP 208/79 mmHg  Pulse 76  Temp(Src) 97 F (36.1 C) (Oral)  Ht 5\' 3"  (1.6 m)  Wt 122 lb 9.6 oz  (55.611 kg)  BMI 21.72 kg/m2       Assessment & Plan:  1. Hearing loss, bilateral Hearing is adequate but could probably be improved - Ambulatory referral to Audiology   2. Essential hypertension Blood pressure is elevated today. My reading was 170/70. I'm not sure that should reduce it yet but would like the vascular surgeons thoughts he may want to perfuse brain with higher pressures before we reduce it to more acceptable levels. My goal would be in the 140-160 range systol will need to measure lipids at some point 3.   Wardell Honour MD

## 2014-03-12 ENCOUNTER — Encounter: Payer: Self-pay | Admitting: Vascular Surgery

## 2014-03-13 ENCOUNTER — Ambulatory Visit (INDEPENDENT_AMBULATORY_CARE_PROVIDER_SITE_OTHER): Payer: Self-pay | Admitting: Vascular Surgery

## 2014-03-13 ENCOUNTER — Encounter: Payer: Self-pay | Admitting: Vascular Surgery

## 2014-03-13 VITALS — BP 175/63 | HR 73 | Ht 63.0 in | Wt 123.6 lb

## 2014-03-13 DIAGNOSIS — G459 Transient cerebral ischemic attack, unspecified: Secondary | ICD-10-CM

## 2014-03-13 DIAGNOSIS — I6523 Occlusion and stenosis of bilateral carotid arteries: Secondary | ICD-10-CM

## 2014-03-13 DIAGNOSIS — I6522 Occlusion and stenosis of left carotid artery: Secondary | ICD-10-CM

## 2014-03-13 NOTE — Progress Notes (Signed)
Postoperative Visit   History of Present Illness  Jenna Ramos is a 81 y.o. female who presents for postoperative follow-up for: left CEA (Date: 02/19/14).  The patient's neck incision is nearly healed.  The patient has had no stroke or TIA symptoms. She does complain of "dizziness" and "head feeling heavy." She denies any syncope. She has recently established care with a new primary care provider, Dr. Alain Honey.   For VQI Use Only  PRE-ADM LIVING: Home  AMB STATUS: Ambulatory  History   Social History  . Marital Status: Widowed    Spouse Name: N/A    Number of Children: N/A  . Years of Education: N/A   Occupational History  . Not on file.   Social History Main Topics  . Smoking status: Former Research scientist (life sciences)  . Smokeless tobacco: Never Used     Comment: quit smoking 86yrs ago  . Alcohol Use: 0.0 oz/week    0 Not specified per week     Comment: wine on holidays  . Drug Use: No  . Sexual Activity: Yes    Birth Control/ Protection: Post-menopausal   Other Topics Concern  . Not on file   Social History Narrative    Current Outpatient Prescriptions on File Prior to Visit  Medication Sig Dispense Refill  . aspirin 325 MG EC tablet Take 325 mg by mouth.    Marland Kitchen atorvastatin (LIPITOR) 40 MG tablet Take 1 tablet (40 mg total) by mouth daily. 30 tablet 5  . benazepril (LOTENSIN) 10 MG tablet Take 1 tablet (10 mg total) by mouth daily. 30 tablet 0  . BESIVANCE 0.6 % SUSP Place 1 drop into both eyes See admin instructions. Take day of eye shots and the day after    . bimatoprost (LUMIGAN) 0.03 % ophthalmic solution Place 1 drop into both eyes at bedtime.    . Calcium Carbonate-Vitamin D (CALCIUM 600/VITAMIN D) 600-400 MG-UNIT per tablet Take 1 tablet by mouth 3 (three) times daily with meals.     Marland Kitchen FLUZONE HIGH-DOSE 0.5 ML SUSY Inject 1 application into the muscle once.    Marland Kitchen HYDROcodone-acetaminophen (NORCO/VICODIN) 5-325 MG per tablet Take 1-2 tablets by mouth every 4  (four) hours as needed. 16 tablet 0  . iron polysaccharides (NIFEREX) 150 MG capsule Take 1 capsule (150 mg total) by mouth 2 (two) times daily before lunch and supper. 60 capsule 1  . levothyroxine (SYNTHROID, LEVOTHROID) 88 MCG tablet Take 44-88 mcg by mouth daily. 1 daily Mon-Fri and 1/2 tab on Sat-Sun    . Menthol-Methyl Salicylate (MUSCLE RUB) 10-15 % CREA Apply 1 application topically 3 (three) times daily. To shoulders/neck  0  . Multiple Vitamins-Minerals (MULTIVITAMINS THER. W/MINERALS) TABS Take 1 tablet by mouth daily.    . Multiple Vitamins-Minerals (PRESERVISION AREDS 2 PO) Take 1 capsule by mouth 2 (two) times daily.    . Omega-3 Fatty Acids (FISH OIL) 1200 MG CAPS Take 1 capsule by mouth 3 (three) times daily with meals.    Marland Kitchen oxyCODONE (OXY IR/ROXICODONE) 5 MG immediate release tablet Take 1 tablet (5 mg total) by mouth every 6 (six) hours as needed for severe pain. 15 tablet 0   No current facility-administered medications on file prior to visit.    Physical Examination  Filed Vitals:   03/13/14 1510  BP: 175/63  Pulse:     Left neck: Incision is nearly healed. Left carotid bruit. Neuro: CN 2-12 are intact, Motor strength is 5/5  bilaterally, sensation is  grossly intact   Medical Decision Making  Jenna Ramos is a 81 y.o. female who presents s/p left CEA. Right asymptomatic ICA stenosis 50%.    The patient's neck incision is healing with no stroke symptoms. The patient's "dizziness" symptoms are likely related to her hypertension. Her systolic blood pressure readings today were 200s and 170s, right arm and left arm respectively. Ok from vascular standpoint to lower blood pressure. She will return to her PCP Dr. Sabra Heck for blood pressure medication titration.  The patient is currently  on an antiplatelet: ASA. The patient is currently on a statin: lipitor. The patient is aware that without maximal medical management the underlying atherosclerotic disease  process will progress, limiting the benefit of any interventions. The patient's surveillance will included routine carotid duplex studies which will be completed in: 6 months, at which time the patient will be re-evaluated.  The patient agrees to participate in their maximal medical care and routine surveillance.  Thank you for allowing [place] to participate in this patient's care.  Virgina Jock, PA-C [facility] Office: [phone]  This patient was seen and examined in conjunction with Dr. Bridgett Larsson.   Addendum  I have independently interviewed and examined the patient, and I agree with the physician assistant's findings.  Neuro intact.  Midline tongue.  Motor symmetric 5/5.  L neck healing well.  Carotid duplex in 6 months.  Adele Barthel, MD [facility] Office: [phone] Pager: [phone]  03/13/2014, 5:30 PM

## 2014-03-23 ENCOUNTER — Encounter (INDEPENDENT_AMBULATORY_CARE_PROVIDER_SITE_OTHER): Payer: Federal, State, Local not specified - PPO | Admitting: Ophthalmology

## 2014-03-23 DIAGNOSIS — H35033 Hypertensive retinopathy, bilateral: Secondary | ICD-10-CM

## 2014-03-23 DIAGNOSIS — H43813 Vitreous degeneration, bilateral: Secondary | ICD-10-CM | POA: Diagnosis not present

## 2014-03-23 DIAGNOSIS — I10 Essential (primary) hypertension: Secondary | ICD-10-CM | POA: Diagnosis not present

## 2014-03-23 DIAGNOSIS — H3532 Exudative age-related macular degeneration: Secondary | ICD-10-CM

## 2014-03-30 ENCOUNTER — Encounter: Payer: Self-pay | Admitting: Family Medicine

## 2014-03-30 ENCOUNTER — Other Ambulatory Visit: Payer: Self-pay | Admitting: Family Medicine

## 2014-03-30 ENCOUNTER — Telehealth: Payer: Self-pay | Admitting: Family Medicine

## 2014-03-30 ENCOUNTER — Other Ambulatory Visit: Payer: Self-pay | Admitting: *Deleted

## 2014-03-30 ENCOUNTER — Ambulatory Visit (INDEPENDENT_AMBULATORY_CARE_PROVIDER_SITE_OTHER): Payer: Federal, State, Local not specified - PPO | Admitting: Family Medicine

## 2014-03-30 VITALS — BP 173/79 | HR 75 | Temp 97.9°F | Ht 63.0 in | Wt 124.0 lb

## 2014-03-30 DIAGNOSIS — M25551 Pain in right hip: Secondary | ICD-10-CM

## 2014-03-30 MED ORDER — ATORVASTATIN CALCIUM 40 MG PO TABS: 40.0000 mg | ORAL_TABLET | Freq: Every day | ORAL | Status: DC

## 2014-03-30 MED ORDER — KETOROLAC TROMETHAMINE 30 MG/ML IM SOLN: 30.0000 mg | Freq: Once | INTRAMUSCULAR | Status: DC

## 2014-03-30 MED ORDER — BENAZEPRIL HCL 10 MG PO TABS: 10.0000 mg | ORAL_TABLET | Freq: Every day | ORAL | Status: DC

## 2014-03-30 NOTE — Telephone Encounter (Signed)
Pt called triage needing appt today for hip pain so I did the RX refills as well, will close this encounter.

## 2014-03-30 NOTE — Telephone Encounter (Signed)
Pt given appt today with dr stacks at 4:55.

## 2014-03-30 NOTE — Progress Notes (Signed)
Subjective:    Patient ID: Jenna Ramos, female    DOB: Mar 10, 1928, 81 y.o.   MRN: [id number]  HPI  81 year old female comes in today complaining of severe right hip pain with no known injury. She did have left hip arthroscopy with Dr Case in 10/2013. She has a prescription for oxycodone but only takes if absolutely necessary because it makes her drowsy. Today she would rate her pain at 8/10. She had a fall on August 9 that led to 2 surgeries on the left hip. The oxycodone was given to her for that. However she has started using it fairly regularly for the right hip as well. It is very weak and she is concerned about fall. However she denies following since the original fall on August 9.   Patient Active Problem List   Diagnosis Date Noted  . TIA (transient ischemic attack) 01/23/2014  . Carotid stenosis 01/14/2014  . 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 Fibromyalgia muscle pain 10/28/2013  . Acute blood loss anemia 10/28/2013  . Hip fracture requiring operative repair 10/17/2013  . Anemia    Outpatient Encounter Prescriptions as of 03/30/2014  Medication Sig  . aspirin 325 MG EC tablet Take 325 mg by mouth.  Marland Kitchen atorvastatin (LIPITOR) 40 MG tablet Take 1 tablet (40 mg total) by mouth daily.  . benazepril (LOTENSIN) 10 MG tablet Take 1 tablet (10 mg total) by mouth daily.  Marland Kitchen BESIVANCE 0.6 % SUSP Place 1 drop into both eyes See admin instructions. Take day of eye shots and the day after  . bimatoprost (LUMIGAN) 0.03 % ophthalmic solution Place 1 drop into both eyes at bedtime.  . Calcium Carbonate-Vitamin D (CALCIUM 600/VITAMIN D) 600-400 MG-UNIT per tablet Take 1 tablet by mouth 3 (three) times daily with meals.   . iron polysaccharides (NIFEREX) 150 MG capsule Take 1 capsule (150 mg total) by mouth 2 (two) times daily before lunch and supper.  . levothyroxine (SYNTHROID, LEVOTHROID) 88 MCG tablet Take 44-88 mcg by mouth daily. 1 daily Mon-Fri and  1/2 tab on Sat-Sun  . Menthol-Methyl Salicylate (MUSCLE RUB) 10-15 % CREA Apply 1 application topically 3 (three) times daily. To shoulders/neck  . Multiple Vitamins-Minerals (MULTIVITAMINS THER. W/MINERALS) TABS Take 1 tablet by mouth daily.  . Multiple Vitamins-Minerals (PRESERVISION AREDS 2 PO) Take 1 capsule by mouth 2 (two) times daily.  . Omega-3 Fatty Acids (FISH OIL) 1200 MG CAPS Take 1 capsule by mouth 3 (three) times daily with meals.  Marland Kitchen oxyCODONE (OXY IR/ROXICODONE) 5 MG immediate release tablet Take 1 tablet (5 mg total) by mouth every 6 (six) hours as needed for severe pain.  . [DISCONTINUED] cephALEXin (KEFLEX) 500 MG capsule   . [DISCONTINUED] FLUZONE HIGH-DOSE 0.5 ML SUSY Inject 1 application into the muscle once.  . [DISCONTINUED] HYDROcodone-acetaminophen (NORCO/VICODIN) 5-325 MG per tablet Take 1-2 tablets by mouth every 4 (four) hours as needed.    Review of Systems  Constitutional: Negative for fever, chills, diaphoresis, appetite change, fatigue and unexpected weight change.  HENT: Negative for congestion, ear pain, hearing loss, postnasal drip, rhinorrhea, sneezing, sore throat and trouble swallowing.   Eyes: Negative for pain.  Respiratory: Negative for cough, chest tightness and shortness of breath.   Cardiovascular: Negative for chest pain and palpitations.  Gastrointestinal: Negative for nausea, vomiting, abdominal pain, diarrhea and constipation.  Genitourinary: Negative for dysuria, frequency and menstrual problem.  Musculoskeletal: Positive for myalgias, arthralgias and neck pain. Negative for joint  swelling.       See HPI  Skin: Negative for rash.  Neurological: Negative for dizziness, weakness, numbness and headaches.  Psychiatric/Behavioral: Negative for dysphoric mood and agitation.       Objective:   Physical Exam  Constitutional: She is oriented to person, place, and time. She appears well-nourished. No distress.  Frail  HENT:  Head: Normocephalic  and atraumatic.  Right Ear: External ear normal.  Left Ear: External ear normal.  Nose: Nose normal.  Mouth/Throat: Oropharynx is clear and moist.  Eyes: Conjunctivae and EOM are normal. Pupils are equal, round, and reactive to light.  Neck: Normal range of motion. Neck supple. No thyromegaly present.  Cardiovascular: Normal rate, regular rhythm and normal heart sounds.   No murmur heard. Pulmonary/Chest: Effort normal and breath sounds normal. No respiratory distress. She has no wheezes. She has no rales.  Abdominal: Soft. Bowel sounds are normal. She exhibits no distension. There is no tenderness.  Lymphadenopathy:    She has no cervical adenopathy.  Neurological: She is alert and oriented to person, place, and time. She has normal reflexes.  Skin: Skin is warm and dry.  Psychiatric: She has a normal mood and affect. Her behavior is normal. Judgment and thought content normal.   BP 173/79 mmHg  Pulse 75  Temp(Src) 97.9 F (36.6 C) (Oral)  Ht 5\' 3"  (1.6 m)  Wt 124 lb (56.246 kg)  BMI 21.97 kg/m2        Assessment & Plan:   1. Hip pain, right     Meds ordered this encounter  Medications  . ketorolac (TORADOL) injection 30 mg    Sig:     Orders Placed This Encounter  Procedures  . DG HIP UNILAT WITH PELVIS 2-3 VIEWS RIGHT    Standing Status: Future     Number of Occurrences: 1     Standing Expiration Date: 05/30/2015    Order Specific Question:  Reason for Exam (SYMPTOM  OR DIAGNOSIS REQUIRED)    Answer:  right hip pain    Order Specific Question:  Preferred imaging location?    Answer:  Internal    Labs pending Health Maintenance Diet and exercise encouraged Continue all meds as discussed Follow up in 3 mos   Claretta Fraise, MD

## 2014-03-31 ENCOUNTER — Other Ambulatory Visit: Payer: Federal, State, Local not specified - PPO

## 2014-03-31 ENCOUNTER — Ambulatory Visit (INDEPENDENT_AMBULATORY_CARE_PROVIDER_SITE_OTHER): Payer: Federal, State, Local not specified - PPO

## 2014-03-31 DIAGNOSIS — M25559 Pain in unspecified hip: Secondary | ICD-10-CM | POA: Insufficient documentation

## 2014-03-31 DIAGNOSIS — M25551 Pain in right hip: Secondary | ICD-10-CM

## 2014-04-01 MED ORDER — KETOROLAC TROMETHAMINE 30 MG/ML IJ SOLN
30.0000 mg | Freq: Once | INTRAMUSCULAR | Status: AC
  Administered 2014-04-01: 30 mg via INTRAMUSCULAR

## 2014-04-01 NOTE — Addendum Note (Signed)
Addended by: Ilean China on: 04/01/2014 04:20 PM   Modules accepted: Orders

## 2014-04-15 ENCOUNTER — Telehealth: Payer: Self-pay | Admitting: Family Medicine

## 2014-04-15 MED ORDER — POLYSACCHARIDE IRON COMPLEX 150 MG PO CAPS: 150.0000 mg | ORAL_CAPSULE | Freq: Two times a day (BID) | ORAL | Status: DC

## 2014-04-15 NOTE — Telephone Encounter (Signed)
Refilled as requested  

## 2014-04-15 NOTE — Telephone Encounter (Signed)
Pt aware.

## 2014-04-23 ENCOUNTER — Encounter (INDEPENDENT_AMBULATORY_CARE_PROVIDER_SITE_OTHER): Payer: Federal, State, Local not specified - PPO | Admitting: Ophthalmology

## 2014-04-23 DIAGNOSIS — H43813 Vitreous degeneration, bilateral: Secondary | ICD-10-CM

## 2014-04-23 DIAGNOSIS — H35033 Hypertensive retinopathy, bilateral: Secondary | ICD-10-CM

## 2014-04-23 DIAGNOSIS — I10 Essential (primary) hypertension: Secondary | ICD-10-CM

## 2014-04-23 DIAGNOSIS — H3532 Exudative age-related macular degeneration: Secondary | ICD-10-CM

## 2014-05-04 ENCOUNTER — Ambulatory Visit (INDEPENDENT_AMBULATORY_CARE_PROVIDER_SITE_OTHER): Payer: Federal, State, Local not specified - PPO | Admitting: Family Medicine

## 2014-05-04 ENCOUNTER — Encounter: Payer: Self-pay | Admitting: Family Medicine

## 2014-05-04 VITALS — BP 174/66 | HR 71 | Temp 96.9°F | Ht 63.0 in | Wt 124.0 lb

## 2014-05-04 DIAGNOSIS — M25559 Pain in unspecified hip: Secondary | ICD-10-CM

## 2014-05-04 DIAGNOSIS — I10 Essential (primary) hypertension: Secondary | ICD-10-CM | POA: Insufficient documentation

## 2014-05-04 DIAGNOSIS — I6523 Occlusion and stenosis of bilateral carotid arteries: Secondary | ICD-10-CM

## 2014-05-04 MED ORDER — BENAZEPRIL HCL 20 MG PO TABS: 20.0000 mg | ORAL_TABLET | Freq: Every day | ORAL | Status: DC

## 2014-05-04 MED ORDER — BENAZEPRIL HCL 10 MG PO TABS: 20.0000 mg | ORAL_TABLET | Freq: Every day | ORAL | Status: DC

## 2014-05-04 NOTE — Progress Notes (Signed)
Subjective:    Patient ID: Jenna Ramos, female    DOB: [birth year], 81 y.o.   MRN: [id number]  HPI 45-y she is to begin physical therapy for hip and leg pain. ear-old female who is here to follow-up with her blood pressure. She's been having more pain with her hips recently she has a left hip prosthesis but now her right hip and both legs are hurting. She has seen the orthopedic doctor and he has given her a prescription for Medrol Dosepak. At the time of her orthopedic visit her blood pressure was elevated above 200 and she was seen in the emergency room at [facility] and started on Norvasc. She had already been taking benazepril 10 mg. Today her pressure has improved but still has systolic hypertension.  Patient Active Problem List   Diagnosis Date Noted  . Hip pain 03/31/2014  . TIA (transient ischemic attack) 01/23/2014  . Carotid stenosis 01/14/2014  . 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 Fibromyalgia muscle pain 10/28/2013  . Acute blood loss anemia 10/28/2013  . Hip fracture requiring operative repair 10/17/2013  . Anemia    Outpatient Encounter Prescriptions as of 05/04/2014  Medication Sig  . amLODipine (NORVASC) 5 MG tablet 1 tablet.  Marland Kitchen aspirin 325 MG EC tablet Take 325 mg by mouth.  Marland Kitchen atorvastatin (LIPITOR) 40 MG tablet Take 1 tablet (40 mg total) by mouth daily.  . benazepril (LOTENSIN) 10 MG tablet Take 1 tablet (10 mg total) by mouth daily.  Marland Kitchen BESIVANCE 0.6 % SUSP Place 1 drop into both eyes See admin instructions. Take day of eye shots and the day after  . bimatoprost (LUMIGAN) 0.03 % ophthalmic solution Place 1 drop into both eyes at bedtime.  . Calcium Carbonate-Vitamin D (CALCIUM 600/VITAMIN D) 600-400 MG-UNIT per tablet Take 1 tablet by mouth 3 (three) times daily with meals.   . iron polysaccharides (NIFEREX) 150 MG capsule Take 1 capsule (150 mg total) by mouth 2 (two) times daily before lunch and supper.  . levothyroxine  (SYNTHROID, LEVOTHROID) 88 MCG tablet Take 44-88 mcg by mouth daily. 1 daily Mon-Fri and 1/2 tab on Sat-Sun  . Multiple Vitamins-Minerals (MULTIVITAMINS THER. W/MINERALS) TABS Take 1 tablet by mouth daily.  . Omega-3 Fatty Acids (FISH OIL) 1200 MG CAPS Take 1 capsule by mouth 3 (three) times daily with meals.  Marland Kitchen oxyCODONE (OXY IR/ROXICODONE) 5 MG immediate release tablet Take 1 tablet (5 mg total) by mouth every 6 (six) hours as needed for severe pain.  . [DISCONTINUED] Multiple Vitamins-Minerals (PRESERVISION AREDS 2 PO) Take 1 capsule by mouth 2 (two) times daily.  . methylPREDNISolone (MEDROL) 4 MG tablet   . [DISCONTINUED] Menthol-Methyl Salicylate (MUSCLE RUB) 10-15 % CREA Apply 1 application topically 3 (three) times daily. To shoulders/neck     Review of Systems  Constitutional: Negative.   HENT: Negative.   Eyes: Negative.   Respiratory: Negative.   Cardiovascular: Negative.   Gastrointestinal: Negative.   Endocrine: Negative.   Genitourinary: Negative.   Musculoskeletal: Positive for arthralgias and gait problem.  Hematological: Negative.   Psychiatric/Behavioral: Negative.        Objective:   Physical Exam  Cardiovascular: Normal rate and regular rhythm.   Pulmonary/Chest: Effort normal and breath sounds normal.  Musculoskeletal: Normal range of motion.    BP 174/66 mmHg  Pulse 71  Temp(Src) 96.9 F (36.1 C) (Oral)  Ht 5\' 3"  (1.6 m)  Wt 124 lb (56.246 kg)  BMI 21.97 kg/m2  Assessment & Plan:  1. Hip pain, unspecified laterality For PT; take oxycodone as needed  2. Essential hypertension Increase benazepril to 20 mg  Wardell Honour MD

## 2014-05-12 ENCOUNTER — Encounter: Payer: Self-pay | Admitting: Physical Therapy

## 2014-05-12 ENCOUNTER — Ambulatory Visit: Payer: Federal, State, Local not specified - PPO | Admitting: *Deleted

## 2014-05-12 ENCOUNTER — Ambulatory Visit: Payer: Federal, State, Local not specified - PPO | Attending: Orthopedic Surgery | Admitting: Physical Therapy

## 2014-05-12 VITALS — BP 173/72 | HR 71

## 2014-05-12 DIAGNOSIS — M79605 Pain in left leg: Secondary | ICD-10-CM | POA: Diagnosis not present

## 2014-05-12 DIAGNOSIS — M79604 Pain in right leg: Secondary | ICD-10-CM | POA: Diagnosis not present

## 2014-05-12 DIAGNOSIS — I10 Essential (primary) hypertension: Secondary | ICD-10-CM

## 2014-05-12 NOTE — Progress Notes (Signed)
Pt was told to drop by after her physical therapy to check BP. Pt brought in BP log and reading today in office was 173/72. BP log will be scanned into chart. Pt will continue to monitor BP readings and come in once a week to have BP done in office. She has a follow up appt with Dr.Miller already scheduled.

## 2014-05-12 NOTE — Therapy (Signed)
[facility] [place], [place], [address] Phone: [phone]   Fax:  [phone]  Physical Therapy Evaluation  Patient Details  Name: Jenna Ramos MRN: [id number] Date of Birth: [birth year] Referring Provider:  Case, Reche Dixon, MD  Encounter Date: 05/12/2014      PT End of Session - 05/12/14 0914    Visit Number 1   Number of Visits 12   PT Start Time 0905   PT Stop Time 0944   PT Time Calculation (min) 39 min   Activity Tolerance Patient tolerated treatment well   Behavior During Therapy [facility] for tasks assessed/performed      Past Medical History  Diagnosis Date  . Rectal incontinence   . Arthritis   . Fibromyalgia   . Glaucoma     uses Eye Drops daily  . Macular degeneration     wet and gets injections in both eyes  . HOH (hard of hearing)   . Stroke   . Hyperlipidemia     takes Atorvastatin daily  . Hypertension     takes Benazepril daily  . Hypothyroidism     takes Synthroid daily  . Anemia     takes Iron pill daily  . Peripheral vascular disease   . Nocturia   . Carotid artery occlusion   . Fibromyalgia     Past Surgical History  Procedure Laterality Date  . Tubal ligation    . Appendectomy    . Tonsillectomy    . Cataract surgery Bilateral   . Colonoscopy    . Esophagogastroduodenoscopy    . Wrist surgery      pins and screws  . Total hip arthroplasty Left 10/2013  . Endarterectomy Left 02/19/2014    Procedure: ENDARTERECTOMY CAROTID;  Surgeon: Conrad , MD;  Location: [facility];  Service: Vascular;  Laterality: Left;  . Carotid endarterectomy      BP 189/79 mmHg  Pulse 68  SpO2 98%  Visit Diagnosis:  Right leg pain  Left leg pain      Subjective Assessment - 05/12/14 0925    Symptoms I believe my fibromyalgia contributes to my pain-level.   Patient Stated Goals Get out of pain.   Currently in Pain? Yes   Pain Score 6    Pain Location Leg   Pain Orientation Right;Left   Pain Descriptors / Indicators Aching   Pain Type Chronic pain   Pain Onset More than a month ago   Pain Frequency Intermittent   Aggravating Factors  After working outside.   Pain Relieving Factors Rest.   Multiple Pain Sites No          [facility] PT Assessment - 05/12/14 0001    Assessment   Medical Diagnosis Bilateral leg pain    Onset Date --  August 2015.   Precautions   Precautions Other (comment);Fall   Precaution Comments Monitor vitals.  Please be with patient at all times for safety.   Balance Screen   Has the patient fallen in the past 6 months No   Has the patient had a decrease in activity level because of a fear of falling?  No   Is the patient reluctant to leave their home because of a fear of falling?  No   Sensation   Additional Comments Normal LE proprioception.   Coordination   Heel Shin Test Normal.   Functional Tests   Functional tests --  Negative Romberg test.   ROM / Strength   AROM /  PROM / Strength Strength   Strength   Overall Strength Comments Left hip abduction= 4/5.   Palpation   Palpation --  No palpable pain.   Ambulation/Gait   Gait Comments Slow and purposeful with shortened step length.                               PT Long Term Goals - 05/12/14 1020    PT LONG TERM GOAL #1   Title Independent.   Time 8   Period Weeks   Status New   PT LONG TERM GOAL #2   Title Perform ADL's with pain not > 2-3/10   Time 8   Period Weeks   Status New   PT LONG TERM GOAL #3   Title Increase left hip abduction strength to 5/5.               Plan - 05/12/14 0915    Clinical Impression Statement The patient had a stroke in August of 2015.  [address] suddenly and fractured left wrist and left femur and underwent ORIF to these areas.  CC today is bilateral LE pain comonly referring to groin region.  Resting pain-level is a 5-6/10 but has gone has high as 10/10.   Pt will benefit from skilled therapeutic intervention in order  to improve on the following deficits Decreased balance;Decreased activity tolerance   Rehab Potential Excellent   PT Frequency 2x / week   PT Duration 8 weeks   PT Treatment/Interventions ADLs/Self Care Home Management;Therapeutic exercise;Balance training;Neuromuscular re-education   PT Next Visit Plan Balance training and left hip strengthening.   Consulted and Agree with Plan of Care Patient         Problem List Patient Active Problem List   Diagnosis Date Noted  . Essential hypertension 05/04/2014  . Hip pain 03/31/2014  . TIA (transient ischemic attack) 01/23/2014  . Carotid stenosis 01/14/2014  . 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 Fibromyalgia muscle pain 10/28/2013  . Acute blood loss anemia 10/28/2013  . Hip fracture requiring operative repair 10/17/2013  . Anemia     APPLEGATE, [place] MPT 05/12/2014, 10:24 AM  [facility] [address] [place], [place], [address] Phone: [phone]   Fax:  [phone]

## 2014-05-19 ENCOUNTER — Ambulatory Visit: Payer: Federal, State, Local not specified - PPO | Admitting: *Deleted

## 2014-05-19 ENCOUNTER — Other Ambulatory Visit: Payer: Self-pay | Admitting: *Deleted

## 2014-05-19 ENCOUNTER — Encounter: Payer: Self-pay | Admitting: *Deleted

## 2014-05-19 ENCOUNTER — Encounter (INDEPENDENT_AMBULATORY_CARE_PROVIDER_SITE_OTHER): Payer: Medicare Other | Admitting: Ophthalmology

## 2014-05-19 DIAGNOSIS — M79604 Pain in right leg: Secondary | ICD-10-CM

## 2014-05-19 DIAGNOSIS — M79605 Pain in left leg: Secondary | ICD-10-CM

## 2014-05-19 MED ORDER — POLYSACCHARIDE IRON COMPLEX 150 MG PO CAPS: 150.0000 mg | ORAL_CAPSULE | Freq: Two times a day (BID) | ORAL | Status: DC

## 2014-05-19 MED ORDER — AMLODIPINE BESYLATE 5 MG PO TABS: 5.0000 mg | ORAL_TABLET | Freq: Every day | ORAL | Status: DC

## 2014-05-19 NOTE — Therapy (Signed)
[facility] [place], [place], [address] Phone: [phone]   Fax:  [phone]  Physical Therapy Treatment  Patient Details  Name: Jenna Ramos MRN: [id number] Date of Birth: 1929/02/13 Referring Provider:  Wardell Honour, MD  Encounter Date: 05/19/2014      PT End of Session - 05/19/14 1150    Visit Number 2   Number of Visits 12   PT Start Time 9741   PT Stop Time 1116   PT Time Calculation (min) 46 min      Past Medical History  Diagnosis Date  . Rectal incontinence   . Arthritis   . Fibromyalgia   . Glaucoma     uses Eye Drops daily  . Macular degeneration     wet and gets injections in both eyes  . HOH (hard of hearing)   . Stroke   . Hyperlipidemia     takes Atorvastatin daily  . Hypertension     takes Benazepril daily  . Hypothyroidism     takes Synthroid daily  . Anemia     takes Iron pill daily  . Peripheral vascular disease   . Nocturia   . Carotid artery occlusion   . Fibromyalgia     Past Surgical History  Procedure Laterality Date  . Tubal ligation    . Appendectomy    . Tonsillectomy    . Cataract surgery Bilateral   . Colonoscopy    . Esophagogastroduodenoscopy    . Wrist surgery      pins and screws  . Total hip arthroplasty Left 10/2013  . Endarterectomy Left 02/19/2014    Procedure: ENDARTERECTOMY CAROTID;  Surgeon: Conrad [place], MD;  Location: [facility];  Service: Vascular;  Laterality: Left;  . Carotid endarterectomy      There were no vitals filed for this visit.  Visit Diagnosis:  Right leg pain  Left leg pain      Subjective Assessment - 05/19/14 1044    Symptoms Doing better today   Patient Stated Goals Get out of pain.   Currently in Pain? Yes   Pain Score 1    Pain Location Leg   Pain Orientation Right;Left   Pain Descriptors / Indicators Aching   Pain Type Chronic pain   Pain Onset More than a month ago   Pain Frequency Intermittent   Aggravating Factors  working outside   Pain Relieving Factors rest                       [facility] Adult PT Treatment/Exercise - 05/19/14 0001    Exercises   Exercises Knee/Hip   Knee/Hip Exercises: Standing   Forward Step Up Right;Left;2 sets;15 reps   Rocker Board 3 minutes  calf stretching and balance   Other Standing Knee Exercises Dyna Disc:each ankle on dyna disc all motions , balance Pad with UE diagonals with contact guard Assist   Other Standing Knee Exercises Hip Abduction 3 x 10  each leg  Nustep L3 x 10 mins seat 7                                                                                                 6 in. Step used for step ups                PT Long Term Goals - 05/12/14 1020    PT LONG TERM GOAL #1   Title Independent.   Time 8   Period Weeks   Status New   PT LONG TERM GOAL #2   Title Perform ADL's with pain not > 2-3/10   Time 8   Period Weeks   Status New   PT LONG TERM GOAL #3   Title Increase left hip abduction strength to 5/5.               Plan - 05/19/14 1150    Clinical Impression Statement Pt did great with exs and balance acts   Pt will benefit from skilled therapeutic intervention in order to improve on the following deficits Decreased balance;Decreased activity tolerance   Rehab Potential Excellent   PT Frequency 2x / week   PT Duration 8 weeks   PT Treatment/Interventions ADLs/Self Care Home Management;Therapeutic exercise;Balance training;Neuromuscular re-education   PT Next Visit Plan Balance training and left hip strengthening.   PT Home Exercise Plan Cont with POC        Problem List Patient Active Problem List   Diagnosis Date Noted  . Essential hypertension 05/04/2014  . Hip pain 03/31/2014  . TIA (transient ischemic attack) 01/23/2014  . Carotid stenosis 01/14/2014  . UTI (urinary tract  infection) due to Morganella Morganii 10/28/2013  . Fibromyalgia muscle pain 10/28/2013  . Acute blood loss anemia 10/28/2013  . Hip fracture requiring operative repair 10/17/2013  . Anemia     RAMSEUR,CHRIS,PTA 05/19/2014, 12:05 PM  [facility] [address] [place], [place], [address] Phone: [phone]   Fax:  [phone]

## 2014-05-20 ENCOUNTER — Encounter (INDEPENDENT_AMBULATORY_CARE_PROVIDER_SITE_OTHER): Payer: Federal, State, Local not specified - PPO | Admitting: Ophthalmology

## 2014-05-20 DIAGNOSIS — H35033 Hypertensive retinopathy, bilateral: Secondary | ICD-10-CM | POA: Diagnosis not present

## 2014-05-20 DIAGNOSIS — H43813 Vitreous degeneration, bilateral: Secondary | ICD-10-CM

## 2014-05-20 DIAGNOSIS — I10 Essential (primary) hypertension: Secondary | ICD-10-CM

## 2014-05-20 DIAGNOSIS — H3532 Exudative age-related macular degeneration: Secondary | ICD-10-CM

## 2014-05-21 ENCOUNTER — Encounter (INDEPENDENT_AMBULATORY_CARE_PROVIDER_SITE_OTHER): Payer: Medicare Other | Admitting: Ophthalmology

## 2014-05-26 ENCOUNTER — Encounter: Payer: Self-pay | Admitting: *Deleted

## 2014-05-26 ENCOUNTER — Ambulatory Visit: Payer: Federal, State, Local not specified - PPO | Admitting: *Deleted

## 2014-05-26 DIAGNOSIS — M79605 Pain in left leg: Secondary | ICD-10-CM

## 2014-05-26 DIAGNOSIS — M79604 Pain in right leg: Secondary | ICD-10-CM | POA: Diagnosis not present

## 2014-05-26 NOTE — Therapy (Signed)
[facility] [place], [place], [address] Phone: [phone]   Fax:  [phone]  Physical Therapy Treatment  Patient Details  Name: Jenna Ramos MRN: [id number] Date of Birth: 19-Oct-1928 Referring Provider:  Wardell Honour, MD  Encounter Date: 05/26/2014      PT End of Session - 05/26/14 1045    Visit Number 3   Number of Visits 12   PT Start Time 1030   PT Stop Time 1116   PT Time Calculation (min) 46 min      Past Medical History  Diagnosis Date  . Rectal incontinence   . Arthritis   . Fibromyalgia   . Glaucoma     uses Eye Drops daily  . Macular degeneration     wet and gets injections in both eyes  . HOH (hard of hearing)   . Stroke   . Hyperlipidemia     takes Atorvastatin daily  . Hypertension     takes Benazepril daily  . Hypothyroidism     takes Synthroid daily  . Anemia     takes Iron pill daily  . Peripheral vascular disease   . Nocturia   . Carotid artery occlusion   . Fibromyalgia     Past Surgical History  Procedure Laterality Date  . Tubal ligation    . Appendectomy    . Tonsillectomy    . Cataract surgery Bilateral   . Colonoscopy    . Esophagogastroduodenoscopy    . Wrist surgery      pins and screws  . Total hip arthroplasty Left 10/2013  . Endarterectomy Left 02/19/2014    Procedure: ENDARTERECTOMY CAROTID;  Surgeon: Conrad Kenton, MD;  Location: [place];  Service: Vascular;  Laterality: Left;  . Carotid endarterectomy      There were no vitals filed for this visit.  Visit Diagnosis:  Right leg pain  Left leg pain      Subjective Assessment - 05/26/14 1039    Symptoms Did ok after last visit. Doing ok today   Patient Stated Goals Get out of pain.   Currently in Pain? Yes   Pain Score 1    Pain Location Leg   Pain Orientation Left;Right   Pain Descriptors / Indicators Aching   Pain Type Chronic pain   Pain Onset More than a month ago   Pain Frequency  Intermittent   Aggravating Factors  working outside   Pain Relieving Factors rest                       [facility] Adult PT Treatment/Exercise - 05/26/14 0001    Exercises   Exercises Knee/Hip   Knee/Hip Exercises: Aerobic   Stationary Bike Nustep L3, seat 6 x 10 mins   Knee/Hip Exercises: Standing   Forward Step Up Right;Left;2 sets;15 reps   Rocker Board 3 minutes  PF and  DF f/b calf stretching   Other Standing Knee Exercises Dyna Disc:each ankle on dyna disc all motions , balance Pad with UE diagonals with contact guard Assist   Other Standing Knee Exercises Hip Abduction 3 x 10  each leg   Manual Therapy   Manual Therapy Passive ROM   Passive ROM single knee to chest both LE's f/b bil. contract/relax stretching to both HS's                     PT Long Term Goals - 05/12/14 1020    PT  LONG TERM GOAL #1   Title Independent.   Time 8   Period Weeks   Status New   PT LONG TERM GOAL #2   Title Perform ADL's with pain not > 2-3/10   Time 8   Period Weeks   Status New   PT LONG TERM GOAL #3   Title Increase left hip abduction strength to 5/5.               Plan - 05/26/14 1116    Clinical Impression Statement pt did great with exs and balance acts   Pt will benefit from skilled therapeutic intervention in order to improve on the following deficits Decreased balance;Decreased activity tolerance   Rehab Potential Excellent   PT Frequency 2x / week   PT Duration 8 weeks   PT Treatment/Interventions ADLs/Self Care Home Management;Therapeutic exercise;Balance training;Neuromuscular re-education   PT Next Visit Plan Balance training and left hip strengthening.   PT Home Exercise Plan Cont with POC   Consulted and Agree with Plan of Care Patient        Problem List Patient Active Problem List   Diagnosis Date Noted  . Essential hypertension 05/04/2014  . Hip pain 03/31/2014  . TIA (transient ischemic attack) 01/23/2014  . Carotid  stenosis 01/14/2014  . 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 Fibromyalgia muscle pain 10/28/2013  . Acute blood loss anemia 10/28/2013  . Hip fracture requiring operative repair 10/17/2013  . Anemia     Aydn Ferrara,CHRIS,PTA 05/26/2014, 11:19 AM  [facility] [address] [place], [place], [address] Phone: [phone]   Fax:  [phone]

## 2014-06-02 ENCOUNTER — Ambulatory Visit: Payer: Federal, State, Local not specified - PPO | Admitting: Physical Therapy

## 2014-06-02 ENCOUNTER — Encounter: Payer: Self-pay | Admitting: Physical Therapy

## 2014-06-02 ENCOUNTER — Encounter: Payer: Self-pay | Admitting: Family Medicine

## 2014-06-02 ENCOUNTER — Ambulatory Visit (INDEPENDENT_AMBULATORY_CARE_PROVIDER_SITE_OTHER): Payer: Federal, State, Local not specified - PPO | Admitting: Family Medicine

## 2014-06-02 VITALS — BP 153/74 | HR 71 | Temp 97.0°F | Ht 63.0 in | Wt 124.0 lb

## 2014-06-02 DIAGNOSIS — I10 Essential (primary) hypertension: Secondary | ICD-10-CM | POA: Diagnosis not present

## 2014-06-02 DIAGNOSIS — M79604 Pain in right leg: Secondary | ICD-10-CM | POA: Diagnosis not present

## 2014-06-02 DIAGNOSIS — M79605 Pain in left leg: Secondary | ICD-10-CM

## 2014-06-02 NOTE — Therapy (Signed)
[facility] [place], [place], [address] Phone: [phone]   Fax:  [phone]  Physical Therapy Treatment  Patient Details  Name: Jenna Ramos MRN: [id number] Date of Birth: 22-Sep-1928 Referring Provider:  Wardell Honour, MD  Encounter Date: 06/02/2014      PT End of Session - 06/02/14 1038    Visit Number 4   Number of Visits 12   PT Start Time 4081   PT Stop Time 1103   PT Time Calculation (min) 31 min   Activity Tolerance Patient tolerated treatment well   Behavior During Therapy [facility] for tasks assessed/performed      Past Medical History  Diagnosis Date  . Rectal incontinence   . Arthritis   . Fibromyalgia   . Glaucoma     uses Eye Drops daily  . Macular degeneration     wet and gets injections in both eyes  . HOH (hard of hearing)   . Stroke   . Hyperlipidemia     takes Atorvastatin daily  . Hypertension     takes Benazepril daily  . Hypothyroidism     takes Synthroid daily  . Anemia     takes Iron pill daily  . Peripheral vascular disease   . Nocturia   . Carotid artery occlusion   . Fibromyalgia     Past Surgical History  Procedure Laterality Date  . Tubal ligation    . Appendectomy    . Tonsillectomy    . Cataract surgery Bilateral   . Colonoscopy    . Esophagogastroduodenoscopy    . Wrist surgery      pins and screws  . Total hip arthroplasty Left 10/2013  . Endarterectomy Left 02/19/2014    Procedure: ENDARTERECTOMY CAROTID;  Surgeon: Conrad , MD;  Location: [place];  Service: Vascular;  Laterality: Left;  . Carotid endarterectomy      There were no vitals filed for this visit.  Visit Diagnosis:  Right leg pain  Left leg pain      Subjective Assessment - 06/02/14 1035    Symptoms Reports that L leg "touchy" today and does not want to take full weight onto LLE. States that she gets pain in her lower scapular region if sitting doing her crafts or sitting at  computer too long. Stated that she marches around her home and feels improvement in strength regarding hip abductors.   Patient Stated Goals Get out of pain.   Currently in Pain? No/denies  Only reports tightness in BLE.            [facility] PT Assessment - 06/02/14 0001    Assessment   Medical Diagnosis Bilateral leg pain                    [facility] Adult PT Treatment/Exercise - 06/02/14 0001    Knee/Hip Exercises: Aerobic   Stationary Bike Nustep L3, seat 8 x 10 mins   Knee/Hip Exercises: Standing   Rocker Board 3 minutes   Other Standing Knee Exercises Toe taps 6" steps 30 reps; Cone touch 3 min floor, cone touch 3 min airex   Other Standing Knee Exercises Dynadisc DF/PF/INV/EV 30 reps; B hip abduction 30 reps each; marching 30 reps                     PT Long Term Goals - 06/02/14 1038    PT LONG TERM GOAL #1   Title Independent.   Time  8   Period Weeks   Status On-going   PT LONG TERM GOAL #2   Title Perform ADL's with pain not > 2-3/10   Time 8   Period Weeks   Status Achieved   PT LONG TERM GOAL #3   Title Increase left hip abduction strength to 5/5.               Plan - 06/02/14 1106    Clinical Impression Statement Patient tolerated balance and strengthening exercises well. Required short rest periods in order to rest legs following exercises. Denied pain following session only tightness in both LE. Met goal #2 of ability to complete ADLs with pain less than 2-3/10.   Pt will benefit from skilled therapeutic intervention in order to improve on the following deficits Decreased balance;Decreased activity tolerance   Rehab Potential Excellent   PT Frequency 2x / week   PT Duration 8 weeks   PT Treatment/Interventions ADLs/Self Care Home Management;Therapeutic exercise;Balance training;Neuromuscular re-education   PT Next Visit Plan Continue balance and strengthenig per PT POC. Consider intiating step ups next session and more advanced  balance exercises.    Consulted and Agree with Plan of Care Patient        Problem List Patient Active Problem List   Diagnosis Date Noted  . Essential hypertension 05/04/2014  . Hip pain 03/31/2014  . TIA (transient ischemic attack) 01/23/2014  . Carotid stenosis 01/14/2014  . 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 Fibromyalgia muscle pain 10/28/2013  . Acute blood loss anemia 10/28/2013  . Hip fracture requiring operative repair 10/17/2013  . Anemia     Wynelle Fanny, PTA 06/02/2014, 11:12 AM  [facility] [address] [place], [place], [address] Phone: [phone]   Fax:  [phone]

## 2014-06-02 NOTE — Progress Notes (Signed)
Subjective:    Patient ID: Jenna Ramos, female    DOB: May 17, 1928, 81 y.o.   MRN: [id number]  HPI 81 year old female here to follow-up blood pressure. At her last visit we increased benazepril to 20 mg to take along with amlodipine. She monitors her pressure at home and brings her cuff with her today and it is in pretty close agreement with our sphygmomanometer. She continues with physical therapy and is doing well from that point of view but she does have some limitations as far as her fibromyalgia and gardening.  Patient Active Problem List   Diagnosis Date Noted  . Essential hypertension 05/04/2014  . Hip pain 03/31/2014  . TIA (transient ischemic attack) 01/23/2014  . Carotid stenosis 01/14/2014  . 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 Fibromyalgia muscle pain 10/28/2013  . Acute blood loss anemia 10/28/2013  . Hip fracture requiring operative repair 10/17/2013  . Anemia    Outpatient Encounter Prescriptions as of 06/02/2014  Medication Sig  . amLODipine (NORVASC) 5 MG tablet Take 1 tablet (5 mg total) by mouth daily.  Marland Kitchen aspirin 325 MG EC tablet Take 325 mg by mouth.  Marland Kitchen atorvastatin (LIPITOR) 40 MG tablet Take 1 tablet (40 mg total) by mouth daily.  . benazepril (LOTENSIN) 20 MG tablet Take 1 tablet (20 mg total) by mouth daily.  Marland Kitchen BESIVANCE 0.6 % SUSP Place 1 drop into both eyes See admin instructions. Take day of eye shots and the day after  . bimatoprost (LUMIGAN) 0.03 % ophthalmic solution Place 1 drop into both eyes at bedtime.  . Calcium Carbonate-Vitamin D (CALCIUM 600/VITAMIN D) 600-400 MG-UNIT per tablet Take 1 tablet by mouth 3 (three) times daily with meals.   . iron polysaccharides (NIFEREX) 150 MG capsule Take 1 capsule (150 mg total) by mouth 2 (two) times daily before lunch and supper.  . levothyroxine (SYNTHROID, LEVOTHROID) 88 MCG tablet Take 44-88 mcg by mouth daily. 1 daily Mon-Fri and 1/2 tab on Sat-Sun  . Multiple  Vitamins-Minerals (MULTIVITAMINS THER. W/MINERALS) TABS Take 1 tablet by mouth daily.  . Omega-3 Fatty Acids (FISH OIL) 1200 MG CAPS Take 1 capsule by mouth 3 (three) times daily with meals.  . [DISCONTINUED] methylPREDNISolone (MEDROL) 4 MG tablet   . oxyCODONE (OXY IR/ROXICODONE) 5 MG immediate release tablet Take 1 tablet (5 mg total) by mouth every 6 (six) hours as needed for severe pain. (Patient not taking: Reported on 06/02/2014)      Review of Systems  Constitutional: Negative.   HENT: Negative.   Respiratory: Negative.   Cardiovascular: Negative.   Genitourinary: Negative.   Neurological: Negative.   Psychiatric/Behavioral: Negative.        Objective:   Physical Exam  Constitutional: She is oriented to person, place, and time. She appears well-developed.  Cardiovascular: Normal rate, regular rhythm and normal heart sounds.   Pulmonary/Chest: Effort normal and breath sounds normal.  Abdominal: Soft.  Musculoskeletal: Normal range of motion.  Neurological: She is alert and oriented to person, place, and time.  Psychiatric: She has a normal mood and affect. Her behavior is normal.    BP 153/74 mmHg  Pulse 71  Temp(Src) 97 F (36.1 C) (Oral)  Ht 5\' 3"  (1.6 m)  Wt 124 lb (56.246 kg)  BMI 21.97 kg/m2       Assessment & Plan:  1. Essential hypertension Blood pressures are controlled on current regimen of benazepril and amlodipine continue at same doses and continue to monitor  When she returns for next visit we should check lipids and thyroid  Wardell Honour MD

## 2014-06-09 ENCOUNTER — Encounter: Payer: Self-pay | Admitting: Physical Therapy

## 2014-06-09 ENCOUNTER — Ambulatory Visit: Payer: Federal, State, Local not specified - PPO | Attending: Orthopedic Surgery | Admitting: Physical Therapy

## 2014-06-09 DIAGNOSIS — M79605 Pain in left leg: Secondary | ICD-10-CM | POA: Insufficient documentation

## 2014-06-09 DIAGNOSIS — M79604 Pain in right leg: Secondary | ICD-10-CM | POA: Diagnosis not present

## 2014-06-09 NOTE — Therapy (Signed)
[facility] [place], [place], [address] Phone: [phone]   Fax:  [phone]  Physical Therapy Treatment  Patient Details  Name: Jenna Ramos MRN: [id number] Date of Birth: 1929/01/10 Referring Provider:  Wardell Honour, MD  Encounter Date: 06/09/2014      PT End of Session - 06/09/14 1043    Visit Number 5   Number of Visits 12   PT Start Time 7858   PT Stop Time 1115   PT Time Calculation (min) 43 min   Activity Tolerance Patient tolerated treatment well   Behavior During Therapy [facility] for tasks assessed/performed      Past Medical History  Diagnosis Date  . Rectal incontinence   . Arthritis   . Fibromyalgia   . Glaucoma     uses Eye Drops daily  . Macular degeneration     wet and gets injections in both eyes  . HOH (hard of hearing)   . Stroke   . Hyperlipidemia     takes Atorvastatin daily  . Hypertension     takes Benazepril daily  . Hypothyroidism     takes Synthroid daily  . Anemia     takes Iron pill daily  . Peripheral vascular disease   . Nocturia   . Carotid artery occlusion   . Fibromyalgia     Past Surgical History  Procedure Laterality Date  . Tubal ligation    . Appendectomy    . Tonsillectomy    . Cataract surgery Bilateral   . Colonoscopy    . Esophagogastroduodenoscopy    . Wrist surgery      pins and screws  . Total hip arthroplasty Left 10/2013  . Endarterectomy Left 02/19/2014    Procedure: ENDARTERECTOMY CAROTID;  Surgeon: Conrad [place], MD;  Location: [place];  Service: Vascular;  Laterality: Left;  . Carotid endarterectomy      There were no vitals filed for this visit.  Visit Diagnosis:  Right leg pain  Left leg pain      Subjective Assessment - 06/09/14 1041    Subjective States that she woke up stiff but has worked out the stiffness this morning. Had to use her self propelled lawnmower the other day in order to walk in her garden.    Patient Stated  Goals Get out of pain.   Currently in Pain? Yes   Pain Score 1    Pain Location Leg   Pain Orientation Right;Left   Pain Descriptors / Indicators Tightness   Pain Type Chronic pain   Pain Onset More than a month ago                       [facility] Adult PT Treatment/Exercise - 06/09/14 0001    Knee/Hip Exercises: Aerobic   Stationary Bike Nustep L3, seat 8 x 10 mins   Knee/Hip Exercises: Standing   Forward Step Up Both;3 sets;10 reps;Step Height: 4"   Rocker Board 3 minutes   Other Standing Knee Exercises Toe taps 4" step 30 reps; color reaching on airex x 5 min   Other Standing Knee Exercises Dynadisc DF/PF/INV/EV 30 reps; B hip abduction 30 reps each; marching 30 reps   Knee/Hip Exercises: Seated   Long Arc Quad Strengthening;Both;3 sets;10 reps   Other Seated Knee Exercises Adductor Squeeze x 30 reps                     PT Long Term Goals -  06/02/14 1038    PT LONG TERM GOAL #1   Title Independent.   Time 8   Period Weeks   Status On-going   PT LONG TERM GOAL #2   Title Perform ADL's with pain not > 2-3/10   Time 8   Period Weeks   Status Achieved   PT LONG TERM GOAL #3   Title Increase left hip abduction strength to 5/5.               Plan - 06/09/14 1121    Clinical Impression Statement Patient tolerated treatment well today. Required short breaks in order to "loosen her legs up." Experienced a pull in LLE during standing hip abduction. Required external focus of attenion during standing hip abduction in order for patient to have correct trunk posture. Experienced tightness following the treatment.    Pt will benefit from skilled therapeutic intervention in order to improve on the following deficits Decreased balance;Decreased activity tolerance   Rehab Potential Excellent   PT Frequency 2x / week   PT Duration 8 weeks   PT Treatment/Interventions ADLs/Self Care Home Management;Therapeutic exercise;Balance training;Neuromuscular  re-education   PT Next Visit Plan Continue per PT POC with strengthening and balance exercises. See MD tomorrow.    Consulted and Agree with Plan of Care Patient        Problem List Patient Active Problem List   Diagnosis Date Noted  . Essential hypertension 05/04/2014  . Hip pain 03/31/2014  . TIA (transient ischemic attack) 01/23/2014  . Carotid stenosis 01/14/2014  . 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 Fibromyalgia muscle pain 10/28/2013  . Acute blood loss anemia 10/28/2013  . Hip fracture requiring operative repair 10/17/2013  . Anemia     Wynelle Fanny, PTA 06/09/2014, 11:26 AM  [facility] [address] [place], [place], [address] Phone: [phone]   Fax:  [phone]

## 2014-06-16 ENCOUNTER — Encounter: Payer: Self-pay | Admitting: Physical Therapy

## 2014-06-16 ENCOUNTER — Ambulatory Visit: Payer: Federal, State, Local not specified - PPO | Admitting: Physical Therapy

## 2014-06-16 DIAGNOSIS — M79605 Pain in left leg: Secondary | ICD-10-CM

## 2014-06-16 DIAGNOSIS — M79604 Pain in right leg: Secondary | ICD-10-CM

## 2014-06-16 NOTE — Therapy (Signed)
[facility] [place], [place], [address] Phone: [phone]   Fax:  [phone]  Physical Therapy Treatment  Patient Details  Name: Jenna Ramos MRN: [id number] Date of Birth: [birth year] Referring Provider:  Wardell Honour, MD  Encounter Date: 06/16/2014      PT End of Session - 06/16/14 1110    Visit Number 6   Number of Visits 12   PT Start Time 2229   PT Stop Time 1111   PT Time Calculation (min) 41 min   Activity Tolerance Patient tolerated treatment well   Behavior During Therapy [facility] for tasks assessed/performed      Past Medical History  Diagnosis Date  . Rectal incontinence   . Arthritis   . Fibromyalgia   . Glaucoma     uses Eye Drops daily  . Macular degeneration     wet and gets injections in both eyes  . HOH (hard of hearing)   . Stroke   . Hyperlipidemia     takes Atorvastatin daily  . Hypertension     takes Benazepril daily  . Hypothyroidism     takes Synthroid daily  . Anemia     takes Iron pill daily  . Peripheral vascular disease   . Nocturia   . Carotid artery occlusion   . Fibromyalgia     Past Surgical History  Procedure Laterality Date  . Tubal ligation    . Appendectomy    . Tonsillectomy    . Cataract surgery Bilateral   . Colonoscopy    . Esophagogastroduodenoscopy    . Wrist surgery      pins and screws  . Total hip arthroplasty Left 10/2013  . Endarterectomy Left 02/19/2014    Procedure: ENDARTERECTOMY CAROTID;  Surgeon: Conrad [place], MD;  Location: [place];  Service: Vascular;  Laterality: Left;  . Carotid endarterectomy      There were no vitals filed for this visit.  Visit Diagnosis:  Right leg pain  Left leg pain      Subjective Assessment - 06/16/14 1034    Subjective has no pain in knee today   Currently in Pain? No/denies                       [facility] Adult PT Treatment/Exercise - 06/16/14 0001    Knee/Hip Exercises: Aerobic   Stationary Bike Nustep L5 x 10 mins   Knee/Hip Exercises: Standing   Forward Step Up Both;2 sets;10 reps   Rocker Board 3 minutes   Other Standing Knee Exercises sit to stand x 10 with UE support   Other Standing Knee Exercises airex balance with 2# reachouts and D1/D2 2x10each   Knee/Hip Exercises: Seated   Other Seated Knee Exercises Hip abd with green tband 3x10   Knee/Hip Exercises: Supine   Bridges 2 sets;10 reps   Knee/Hip Exercises: Sidelying   Hip ABduction AROM;Strengthening;Left;2 sets;10 reps                     PT Long Term Goals - 06/16/14 1055    PT LONG TERM GOAL #1   Title Independent.   Time 8   Period Weeks   Status On-going   PT LONG TERM GOAL #2   Title Perform ADL's with pain not > 2-3/10   Time 8   Period Weeks   Status Achieved   PT LONG TERM GOAL #3   Title Increase left hip abduction strength to  5/5.   Time 8   Period Weeks   Status On-going               Plan - 06/16/14 1111    Clinical Impression Statement patient tolerated tx well and was able to increase resistance on machine today with no difficulty. patient was able to walk up stairs without UE today. no antalgic gait and feels 50% improvement overal. goals ongoing   Pt will benefit from skilled therapeutic intervention in order to improve on the following deficits Decreased balance;Decreased activity tolerance   Rehab Potential Excellent   PT Frequency 2x / week   PT Duration 8 weeks   PT Treatment/Interventions ADLs/Self Care Home Management;Therapeutic exercise;Balance training;Neuromuscular re-education   PT Next Visit Plan Continue per PT POC with strengthening and balance exercises   Consulted and Agree with Plan of Care Patient        Problem List Patient Active Problem List   Diagnosis Date Noted  . Essential hypertension 05/04/2014  . Hip pain 03/31/2014  . TIA (transient ischemic attack) 01/23/2014  . Carotid stenosis 01/14/2014  . UTI (urinary tract  infection) due to Morganella Morganii 10/28/2013  . Fibromyalgia muscle pain 10/28/2013  . Acute blood loss anemia 10/28/2013  . Hip fracture requiring operative repair 10/17/2013  . Anemia    Ladean Raya, PTA 06/16/2014 11:15 AM Jacquelyn Antony P, PTA 06/16/2014, 11:14 AM  [facility] [address] [place], [place], [address] Phone: [phone]   Fax:  [phone]

## 2014-06-17 ENCOUNTER — Encounter (INDEPENDENT_AMBULATORY_CARE_PROVIDER_SITE_OTHER): Payer: Federal, State, Local not specified - PPO | Admitting: Ophthalmology

## 2014-06-17 DIAGNOSIS — H35033 Hypertensive retinopathy, bilateral: Secondary | ICD-10-CM

## 2014-06-17 DIAGNOSIS — I10 Essential (primary) hypertension: Secondary | ICD-10-CM

## 2014-06-17 DIAGNOSIS — H3532 Exudative age-related macular degeneration: Secondary | ICD-10-CM | POA: Diagnosis not present

## 2014-06-17 DIAGNOSIS — H43813 Vitreous degeneration, bilateral: Secondary | ICD-10-CM | POA: Diagnosis not present

## 2014-06-21 ENCOUNTER — Other Ambulatory Visit: Payer: Self-pay | Admitting: Family Medicine

## 2014-06-22 ENCOUNTER — Other Ambulatory Visit: Payer: Self-pay | Admitting: Family Medicine

## 2014-06-22 MED ORDER — AMLODIPINE BESYLATE 5 MG PO TABS: 5.0000 mg | ORAL_TABLET | Freq: Every day | ORAL | Status: DC

## 2014-06-22 NOTE — Telephone Encounter (Signed)
done

## 2014-06-23 ENCOUNTER — Encounter: Payer: Self-pay | Admitting: *Deleted

## 2014-06-23 ENCOUNTER — Ambulatory Visit: Payer: Federal, State, Local not specified - PPO | Admitting: *Deleted

## 2014-06-23 DIAGNOSIS — M79604 Pain in right leg: Secondary | ICD-10-CM | POA: Diagnosis not present

## 2014-06-23 DIAGNOSIS — M79605 Pain in left leg: Secondary | ICD-10-CM

## 2014-06-23 NOTE — Therapy (Signed)
[facility] [place], [place], [address] Phone: [phone]   Fax:  [phone]  Physical Therapy Treatment  Patient Details  Name: Jenna Ramos MRN: [id number] Date of Birth: May 12, 1928 Referring Provider:  Wardell Honour, MD  Encounter Date: 06/23/2014      PT End of Session - 06/23/14 1152    Visit Number 7   Number of Visits 12   Date for PT Re-Evaluation 07/17/14   PT Start Time 5643   PT Stop Time 1126   PT Time Calculation (min) 54 min   Activity Tolerance Patient tolerated treatment well   Behavior During Therapy [facility] for tasks assessed/performed      Past Medical History  Diagnosis Date  . Rectal incontinence   . Arthritis   . Fibromyalgia   . Glaucoma     uses Eye Drops daily  . Macular degeneration     wet and gets injections in both eyes  . HOH (hard of hearing)   . Stroke   . Hyperlipidemia     takes Atorvastatin daily  . Hypertension     takes Benazepril daily  . Hypothyroidism     takes Synthroid daily  . Anemia     takes Iron pill daily  . Peripheral vascular disease   . Nocturia   . Carotid artery occlusion   . Fibromyalgia     Past Surgical History  Procedure Laterality Date  . Tubal ligation    . Appendectomy    . Tonsillectomy    . Cataract surgery Bilateral   . Colonoscopy    . Esophagogastroduodenoscopy    . Wrist surgery      pins and screws  . Total hip arthroplasty Left 10/2013  . Endarterectomy Left 02/19/2014    Procedure: ENDARTERECTOMY CAROTID;  Surgeon: Conrad Georgetown, MD;  Location: [place];  Service: Vascular;  Laterality: Left;  . Carotid endarterectomy      There were no vitals filed for this visit.  Visit Diagnosis:  Right leg pain  Left leg pain      Subjective Assessment - 06/23/14 1137    Subjective no pain in rt knee.  occasional lt hip pain.   Patient Stated Goals wants more balance strength   Currently in Pain? No/denies   Pain Location  Leg   Pain Orientation Right;Left   Pain Descriptors / Indicators Discomfort;Tightness   Pain Type Chronic pain   Pain Onset More than a month ago   Pain Frequency Intermittent   Aggravating Factors  out side chores   Pain Relieving Factors rest   Multiple Pain Sites No   Multiple Pain Sites No                         [facility] Adult PT Treatment/Exercise - 06/23/14 0001    Knee/Hip Exercises: Aerobic   Stationary Bike --  nustep L 5 x 10 min   Knee/Hip Exercises: Standing   Hip ADduction --  standing adduction and abduction with green band 3x 10 bilat   Forward Step Up --  bilateral step ups 6 " step 3 x 10 each without holding   Rocker Board --  3 min for stretch and balance   Other Standing Knee Exercises --  sit to stand 2 x 10   Other Standing Knee Exercises --  D1--D2--with 2 lb wt ball while on air ex   Knee/Hip Exercises: Sidelying   Hip ABduction Limitations --  3 x 10 with grreen band...standing                     PT Long Term Goals - 06/16/14 1055    PT LONG TERM GOAL #1   Title Independent with HEP.   Time 8   Period Weeks   Status On-going   PT LONG TERM GOAL #2   Title Perform ADL's with pain not > 2-3/10   Time 8   Period Weeks   Status Achieved   PT LONG TERM GOAL #3   Title Increase left hip abduction strength to 5/5.   Time 8   Period Weeks   Status On-going               Plan - 06/23/14 1211    Clinical Impression Statement she was able to do step ups 6" steps without holding on. She feels balance is better and strength is improving   Pt will benefit from skilled therapeutic intervention in order to improve on the following deficits Decreased balance;Decreased activity tolerance   Rehab Potential Excellent   PT Frequency 2x / week   PT Duration 8 weeks   PT Treatment/Interventions ADLs/Self Care Home Management;Therapeutic exercise;Balance training;Neuromuscular re-education   PT Next Visit Plan cont  balance and strengthening   PT Home Exercise Plan cont POC   Consulted and Agree with Plan of Care Patient        Problem List Patient Active Problem List   Diagnosis Date Noted  . Essential hypertension 05/04/2014  . Hip pain 03/31/2014  . TIA (transient ischemic attack) 01/23/2014  . Carotid stenosis 01/14/2014  . 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 Fibromyalgia muscle pain 10/28/2013  . Acute blood loss anemia 10/28/2013  . Hip fracture requiring operative repair 10/17/2013  . Anemia     Shanna Cisco 06/23/2014, 12:19 PM  [facility] [address] [place], [place], [address] Phone: [phone]   Fax:  [phone]

## 2014-06-30 ENCOUNTER — Encounter: Payer: Self-pay | Admitting: *Deleted

## 2014-06-30 ENCOUNTER — Ambulatory Visit: Payer: Federal, State, Local not specified - PPO | Admitting: *Deleted

## 2014-06-30 DIAGNOSIS — M79604 Pain in right leg: Secondary | ICD-10-CM

## 2014-06-30 DIAGNOSIS — M79605 Pain in left leg: Secondary | ICD-10-CM

## 2014-06-30 NOTE — Therapy (Signed)
[facility] [place], [place], [address] Phone: [phone]   Fax:  [phone]  Physical Therapy Treatment  Patient Details  Name: Jenna Ramos MRN: [id number] Date of Birth: 1928/09/08 Referring Provider:  Wardell Honour, MD  Encounter Date: 06/30/2014      PT End of Session - 06/30/14 1142    Visit Number 8   Number of Visits 12   Date for PT Re-Evaluation 07/17/14   PT Start Time 5790   PT Stop Time 1121   PT Time Calculation (min) 49 min   Activity Tolerance Patient tolerated treatment well   Behavior During Therapy [facility] for tasks assessed/performed      Past Medical History  Diagnosis Date  . Rectal incontinence   . Arthritis   . Fibromyalgia   . Glaucoma     uses Eye Drops daily  . Macular degeneration     wet and gets injections in both eyes  . HOH (hard of hearing)   . Stroke   . Hyperlipidemia     takes Atorvastatin daily  . Hypertension     takes Benazepril daily  . Hypothyroidism     takes Synthroid daily  . Anemia     takes Iron pill daily  . Peripheral vascular disease   . Nocturia   . Carotid artery occlusion   . Fibromyalgia     Past Surgical History  Procedure Laterality Date  . Tubal ligation    . Appendectomy    . Tonsillectomy    . Cataract surgery Bilateral   . Colonoscopy    . Esophagogastroduodenoscopy    . Wrist surgery      pins and screws  . Total hip arthroplasty Left 10/2013  . Endarterectomy Left 02/19/2014    Procedure: ENDARTERECTOMY CAROTID;  Surgeon: Conrad Chittenden, MD;  Location: [place];  Service: Vascular;  Laterality: Left;  . Carotid endarterectomy      There were no vitals filed for this visit.  Visit Diagnosis:  Right leg pain  Left leg pain      Subjective Assessment - 06/30/14 1125    Subjective In no pain today. Had mowed lawn and clipped bushes at home over weekend. Experienced pain a couple of days after that.   Patient Stated Goals  want balance and strength to be tter   Currently in Pain? No/denies   Pain Type Chronic pain   Pain Onset More than a month ago   Pain Frequency Occasional   Aggravating Factors  yard work   Multiple Pain Sites No                         [facility] Adult PT Treatment/Exercise - 06/30/14 0001    High Level Balance   High Level Balance Activities --  tandem stance, 1 foot balance, airex wt shift   Knee/Hip Exercises: Aerobic   Stationary Bike --  nustep level 5 x 10 min   Knee/Hip Exercises: Standing   Other Standing Knee Exercises --  xts pink assisted stepping sideways bilaterally   Knee/Hip Exercises: Seated   Long Arc Quad Weight --  apollo 10 lbs x 30   Other Seated Knee Exercises --  apollo knee flexion 30 lbs x 30                     PT Long Term Goals - 06/16/14 1055    PT LONG TERM GOAL #1  Title Independent with HEP.   Time 8   Period Weeks   Status On-going   PT LONG TERM GOAL #2   Title Perform ADL's with pain not > 2-3/10   Time 8   Period Weeks   Status Achieved   PT LONG TERM GOAL #3   Title Increase left hip abduction strength to 5/5.   Time 8   Period Weeks   Status On-going               Plan - 06/30/14 1143    Clinical Impression Statement Pt feels strength, balance and endurance better since she is able to do more yard work   Pt will benefit from skilled therapeutic intervention in order to improve on the following deficits Decreased strength;Decreased activity tolerance;Decreased balance   Rehab Potential Excellent   PT Frequency 2x / week   PT Duration 8 weeks   PT Treatment/Interventions ADLs/Self Care Home Management;Balance training;Neuromuscular re-education   PT Next Visit Plan cont balance and strengthening   Consulted and Agree with Plan of Care Patient        Problem List Patient Active Problem List   Diagnosis Date Noted  . Essential hypertension 05/04/2014  . Hip pain 03/31/2014  . TIA  (transient ischemic attack) 01/23/2014  . Carotid stenosis 01/14/2014  . 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 Fibromyalgia muscle pain 10/28/2013  . Acute blood loss anemia 10/28/2013  . Hip fracture requiring operative repair 10/17/2013  . Anemia     Shanna Cisco 06/30/2014, 11:48 AM  [facility] [address] [place], [place], [address] Phone: [phone]   Fax:  [phone]

## 2014-07-07 ENCOUNTER — Encounter: Payer: Self-pay | Admitting: Physical Therapy

## 2014-07-07 ENCOUNTER — Ambulatory Visit: Payer: Federal, State, Local not specified - PPO | Attending: Orthopedic Surgery | Admitting: Physical Therapy

## 2014-07-07 DIAGNOSIS — M79605 Pain in left leg: Secondary | ICD-10-CM | POA: Diagnosis not present

## 2014-07-07 DIAGNOSIS — M79604 Pain in right leg: Secondary | ICD-10-CM | POA: Diagnosis not present

## 2014-07-07 NOTE — Therapy (Signed)
[facility] [place], [place], [address] Phone: [phone]   Fax:  [phone]  Physical Therapy Treatment  Patient Details  Name: Jenna Ramos MRN: [id number] Date of Birth: [birth year] Referring Provider:  Wardell Honour, MD  Encounter Date: 07/07/2014      PT End of Session - 07/07/14 1038    Visit Number 9   Number of Visits 12   Date for PT Re-Evaluation 07/17/14   PT Start Time 6644   PT Stop Time 1111   PT Time Calculation (min) 35 min   Activity Tolerance Patient tolerated treatment well   Behavior During Therapy [facility] for tasks assessed/performed      Past Medical History  Diagnosis Date  . Rectal incontinence   . Arthritis   . Fibromyalgia   . Glaucoma     uses Eye Drops daily  . Macular degeneration     wet and gets injections in both eyes  . HOH (hard of hearing)   . Stroke   . Hyperlipidemia     takes Atorvastatin daily  . Hypertension     takes Benazepril daily  . Hypothyroidism     takes Synthroid daily  . Anemia     takes Iron pill daily  . Peripheral vascular disease   . Nocturia   . Carotid artery occlusion   . Fibromyalgia     Past Surgical History  Procedure Laterality Date  . Tubal ligation    . Appendectomy    . Tonsillectomy    . Cataract surgery Bilateral   . Colonoscopy    . Esophagogastroduodenoscopy    . Wrist surgery      pins and screws  . Total hip arthroplasty Left 10/2013  . Endarterectomy Left 02/19/2014    Procedure: ENDARTERECTOMY CAROTID;  Surgeon: Conrad Galveston, MD;  Location: [place];  Service: Vascular;  Laterality: Left;  . Carotid endarterectomy      There were no vitals filed for this visit.  Visit Diagnosis:  Right leg pain  Left leg pain      Subjective Assessment - 07/07/14 1039    Subjective States she is just achey from doing a lot of activities such as mowing the yard, and trimmng the yard. She left her wrist bracelet in her car  today. States her LLE is more tired than RLE. RLE wants to "jump" more today.   Patient Stated Goals want balance and strength to be tter   Currently in Pain? Yes   Pain Score 1    Pain Location Leg   Pain Orientation Right;Left   Pain Descriptors / Indicators Aching   Pain Type Chronic pain   Pain Onset More than a month ago            [facility] PT Assessment - 07/07/14 0001    Assessment   Medical Diagnosis Bilateral leg pain                      [facility] Adult PT Treatment/Exercise - 07/07/14 0001    Knee/Hip Exercises: Aerobic   Stationary Bike NuStep L6 x8 minutes   Knee/Hip Exercises: Machines for Strengthening   Cybex Knee Extension 10# 1 sets 10 reps, 20# 2 sets 10 reps   Cybex Knee Flexion 30# 1 sets 10 reps, 40# 2 sets 10 reps   Knee/Hip Exercises: Standing   Heel Raises 3 sets;10 reps   Rocker Board 3 minutes   Other Standing Knee Exercises Toe  raises x30 reps, seated Dynadisc circles x43min   Other Standing Knee Exercises Tandem stance no UE support 3x 30 sec, SLS x63min bilaterally on airex, inverted BOSU standing without UE support x80min                     PT Long Term Goals - 07/07/14 1040    PT LONG TERM GOAL #1   Title Independent with HEP.   Time 8   Period Weeks   Status On-going   PT LONG TERM GOAL #2   Title Perform ADL's with pain not > 2-3/10   Time 8   Period Weeks   Status Achieved   PT LONG TERM GOAL #3   Title Increase left hip abduction strength to 5/5.   Time 8   Period Weeks   Status On-going               Plan - 07/07/14 1107    Clinical Impression Statement Patient continues to progress with treatments. Proprioception of bilateral LE appears to have improved due to less difficulty with exercises. No pain reported during exercises only RLE tightness. Experienced fatigue in BLEs following treatment.   Pt will benefit from skilled therapeutic intervention in order to improve on the following deficits  Decreased strength;Decreased activity tolerance;Decreased balance   Rehab Potential Excellent   PT Frequency 2x / week   PT Duration 8 weeks   PT Treatment/Interventions ADLs/Self Care Home Management;Balance training;Neuromuscular re-education   PT Next Visit Plan Continue balance and strengthening per PT POC.   Consulted and Agree with Plan of Care Patient        Problem List Patient Active Problem List   Diagnosis Date Noted  . Essential hypertension 05/04/2014  . Hip pain 03/31/2014  . TIA (transient ischemic attack) 01/23/2014  . Carotid stenosis 01/14/2014  . 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 Fibromyalgia muscle pain 10/28/2013  . Acute blood loss anemia 10/28/2013  . Hip fracture requiring operative repair 10/17/2013  . Anemia     Wynelle Fanny, PTA 07/07/2014, 11:14 AM  [facility] [address] [place], [place], [address] Phone: [phone]   Fax:  [phone]

## 2014-07-12 ENCOUNTER — Other Ambulatory Visit: Payer: Self-pay | Admitting: Family Medicine

## 2014-07-14 ENCOUNTER — Encounter: Payer: Self-pay | Admitting: Physical Therapy

## 2014-07-14 ENCOUNTER — Ambulatory Visit: Payer: Federal, State, Local not specified - PPO | Admitting: Physical Therapy

## 2014-07-14 DIAGNOSIS — M79604 Pain in right leg: Secondary | ICD-10-CM

## 2014-07-14 DIAGNOSIS — M79605 Pain in left leg: Secondary | ICD-10-CM

## 2014-07-14 NOTE — Patient Instructions (Signed)
Strengthening: Hip Abductor - Resisted   With band looped around both legs above knees, push thighs apart. Repeat _10___ times per set. Do __3__ sets per session. Do _2___ sessions per day.  http://orth.exer.us/688   Copyright  VHI. All rights reserved.  Strengthening: Hip Abduction (Side-Lying)   Tighten muscles on front of left thigh, then lift leg __5__ inches from surface, keeping knee locked.  Repeat __10__ times per set. Do _2___ sets per session. Do __2__ sessions per day.  http://orth.exer.us/622   Copyright  VHI. All rights reserved.

## 2014-07-14 NOTE — Therapy (Signed)
[facility] [place], [place], [address] Phone: [phone]   Fax:  [phone]  Physical Therapy Treatment  Patient Details  Name: Jenna Ramos MRN: [id number] Date of Birth: Jul 11, 1928 Referring Provider:  Wardell Honour, MD  Encounter Date: 07/14/2014      PT End of Session - 07/14/14 1102    Visit Number 10   Number of Visits 12   Date for PT Re-Evaluation 07/17/14   PT Start Time 1028   PT Stop Time 1108   PT Time Calculation (min) 40 min   Activity Tolerance Patient tolerated treatment well   Behavior During Therapy [facility] for tasks assessed/performed      Past Medical History  Diagnosis Date  . Rectal incontinence   . Arthritis   . Fibromyalgia   . Glaucoma     uses Eye Drops daily  . Macular degeneration     wet and gets injections in both eyes  . HOH (hard of hearing)   . Stroke   . Hyperlipidemia     takes Atorvastatin daily  . Hypertension     takes Benazepril daily  . Hypothyroidism     takes Synthroid daily  . Anemia     takes Iron pill daily  . Peripheral vascular disease   . Nocturia   . Carotid artery occlusion   . Fibromyalgia     Past Surgical History  Procedure Laterality Date  . Tubal ligation    . Appendectomy    . Tonsillectomy    . Cataract surgery Bilateral   . Colonoscopy    . Esophagogastroduodenoscopy    . Wrist surgery      pins and screws  . Total hip arthroplasty Left 10/2013  . Endarterectomy Left 02/19/2014    Procedure: ENDARTERECTOMY CAROTID;  Surgeon: Conrad Snook, MD;  Location: [place];  Service: Vascular;  Laterality: Left;  . Carotid endarterectomy      There were no vitals filed for this visit.  Visit Diagnosis:  Right leg pain  Left leg pain      Subjective Assessment - 07/14/14 1034    Subjective no complaints after last tx, no pain today. able to work in garden better.   Patient Stated Goals want balance and strength to be tter   Currently in Pain? No/denies                         [facility] Adult PT Treatment/Exercise - 07/14/14 0001    Knee/Hip Exercises: Aerobic   Stationary Bike NuStep L6 x10 minutes   Knee/Hip Exercises: Machines for Strengthening   Cybex Knee Extension 10# 3 x10   Cybex Knee Flexion 30# 3x10   Knee/Hip Exercises: Standing   Heel Raises 3 sets;10 reps   Forward Step Up Both;2 sets;10 reps   Knee/Hip Exercises: Seated   Other Seated Knee Exercises sit to stand with uni to no UE x10   Knee/Hip Exercises: Supine   Other Supine Knee Exercises hip abd with red t-band 3x10   Knee/Hip Exercises: Sidelying   Hip ABduction Strengthening;Left;2 sets;10 reps                PT Education - 07/14/14 1055    Education provided Yes   Education Details HEP hip strength   Person(s) Educated Patient   Methods Explanation;Demonstration;Handout   Comprehension Verbalized understanding;Returned demonstration             PT Long Term Goals -  07/14/14 1105    PT LONG TERM GOAL #1   Title Independent with HEP.   Time 8   Period Weeks   Status Achieved   PT LONG TERM GOAL #2   Title Perform ADL's with pain not > 2-3/10   Time 8   Period Weeks   Status Achieved   PT LONG TERM GOAL #3   Title Increase left hip abduction strength to 5/5.   Time 8   Period Weeks   Status On-going               Plan - 07/14/14 1103    Clinical Impression Statement patient continues to improve with all activities, has no pain reports today and feels 75% better overall. patient undestands HEP for hip strength today. Met LTG #1 others ongoing.   Pt will benefit from skilled therapeutic intervention in order to improve on the following deficits Decreased strength;Decreased activity tolerance;Decreased balance   Rehab Potential Excellent   PT Frequency 2x / week   PT Duration 8 weeks   PT Treatment/Interventions ADLs/Self Care Home Management;Balance training;Neuromuscular re-education    PT Next Visit Plan Continue balance and strengthening per PT POC. / needs renewal to cont (sent today)   Consulted and Agree with Plan of Care Patient        Problem List Patient Active Problem List   Diagnosis Date Noted  . Essential hypertension 05/04/2014  . Hip pain 03/31/2014  . TIA (transient ischemic attack) 01/23/2014  . Carotid stenosis 01/14/2014  . 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 Fibromyalgia muscle pain 10/28/2013  . Acute blood loss anemia 10/28/2013  . Hip fracture requiring operative repair 10/17/2013  . Anemia    Ladean Raya, PTA 07/14/2014 11:10 AM Jenna Ramos, PTA 07/14/2014, 11:10 AM  [facility] [address] [place], [place], [address] Phone: [phone]   Fax:  [phone]

## 2014-07-15 ENCOUNTER — Encounter (INDEPENDENT_AMBULATORY_CARE_PROVIDER_SITE_OTHER): Payer: Federal, State, Local not specified - PPO | Admitting: Ophthalmology

## 2014-07-15 DIAGNOSIS — H3532 Exudative age-related macular degeneration: Secondary | ICD-10-CM

## 2014-07-15 DIAGNOSIS — I10 Essential (primary) hypertension: Secondary | ICD-10-CM | POA: Diagnosis not present

## 2014-07-15 DIAGNOSIS — H43813 Vitreous degeneration, bilateral: Secondary | ICD-10-CM | POA: Diagnosis not present

## 2014-07-15 DIAGNOSIS — H35033 Hypertensive retinopathy, bilateral: Secondary | ICD-10-CM

## 2014-07-21 ENCOUNTER — Encounter: Payer: Self-pay | Admitting: Physical Therapy

## 2014-07-21 ENCOUNTER — Ambulatory Visit: Payer: Federal, State, Local not specified - PPO | Admitting: Physical Therapy

## 2014-07-21 DIAGNOSIS — M79604 Pain in right leg: Secondary | ICD-10-CM | POA: Diagnosis not present

## 2014-07-21 DIAGNOSIS — M79605 Pain in left leg: Secondary | ICD-10-CM

## 2014-07-21 NOTE — Therapy (Signed)
[facility] [place], [place], [address] Phone: [phone]   Fax:  [phone]  Physical Therapy Treatment  Patient Details  Name: Jenna Ramos MRN: [id number] Date of Birth: 05/01/1928 Referring Provider:  Wardell Honour, MD  Encounter Date: 07/21/2014      PT End of Session - 07/21/14 1104    Visit Number 11   Number of Visits 12   Date for PT Re-Evaluation 07/17/14   PT Start Time 1028   PT Stop Time 1108   PT Time Calculation (min) 40 min   Activity Tolerance Patient tolerated treatment well   Behavior During Therapy [facility] for tasks assessed/performed      Past Medical History  Diagnosis Date  . Rectal incontinence   . Arthritis   . Fibromyalgia   . Glaucoma     uses Eye Drops daily  . Macular degeneration     wet and gets injections in both eyes  . HOH (hard of hearing)   . Stroke   . Hyperlipidemia     takes Atorvastatin daily  . Hypertension     takes Benazepril daily  . Hypothyroidism     takes Synthroid daily  . Anemia     takes Iron pill daily  . Peripheral vascular disease   . Nocturia   . Carotid artery occlusion   . Fibromyalgia     Past Surgical History  Procedure Laterality Date  . Tubal ligation    . Appendectomy    . Tonsillectomy    . Cataract surgery Bilateral   . Colonoscopy    . Esophagogastroduodenoscopy    . Wrist surgery      pins and screws  . Total hip arthroplasty Left 10/2013  . Endarterectomy Left 02/19/2014    Procedure: ENDARTERECTOMY CAROTID;  Surgeon: Conrad Windom, MD;  Location: [place];  Service: Vascular;  Laterality: Left;  . Carotid endarterectomy      There were no vitals filed for this visit.  Visit Diagnosis:  Right leg pain  Left leg pain      Subjective Assessment - 07/21/14 1037    Subjective doing ok today   Patient Stated Goals want balance and strength to better   Currently in Pain? No/denies            [facility] PT Assessment  - 07/21/14 0001    Strength   Overall Strength Deficits   Overall Strength Comments Left hip abduction= 4/5.                     [facility] Adult PT Treatment/Exercise - 07/21/14 0001    Knee/Hip Exercises: Aerobic   Stationary Bike NuStep L6 x10 minutes  patient attempted LE only, advised to lower resistance if LE   Knee/Hip Exercises: Machines for Strengthening   Cybex Knee Extension 10# 2 x10   Cybex Knee Flexion 30# 3x10   Knee/Hip Exercises: Standing   Hip ADduction Both;2 sets;10 reps;Strengthening  Red T-band   Lateral Step Up Both;2 sets;10 reps;Step Height: 6"   Forward Step Up Both;2 sets;10 reps;Step Height: 6"   Knee/Hip Exercises: Sidelying   Hip ABduction Strengthening;Left;2 sets;10 reps                     PT Long Term Goals - 07/21/14 1109    PT LONG TERM GOAL #1   Title Independent with HEP.   Time 8   Period Weeks   Status Achieved  PT LONG TERM GOAL #2   Title Perform ADL's with pain not > 2-3/10   Time 8   Period Weeks   Status Achieved   PT LONG TERM GOAL #3   Title Increase left hip abduction strength to 5/5.   Time 8   Period Weeks   Status On-going  4/5               Plan - 07/21/14 1105    Clinical Impression Statement patient continues to improve with all activities with strength/endurance. patient feels improved overall and has met all goals except left hip strength (4/5 left hip strength) patent continues to work on [employer] daily.   Pt will benefit from skilled therapeutic intervention in order to improve on the following deficits Decreased strength;Decreased activity tolerance;Decreased balance   Rehab Potential Excellent   PT Frequency 2x / week   PT Duration 8 weeks   PT Treatment/Interventions ADLs/Self Care Home Management;Balance training;Neuromuscular re-education   PT Next Visit Plan MD note next tx for renewal or DC pending MD   Consulted and Agree with Plan of Care Patient        Problem  List Patient Active Problem List   Diagnosis Date Noted  . Essential hypertension 05/04/2014  . Hip pain 03/31/2014  . TIA (transient ischemic attack) 01/23/2014  . Carotid stenosis 01/14/2014  . 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 Fibromyalgia muscle pain 10/28/2013  . Acute blood loss anemia 10/28/2013  . Hip fracture requiring operative repair 10/17/2013  . Anemia     Jery Hollern P, PTA 07/21/2014, 11:09 AM  [facility] [address] [place], [place], [address] Phone: [phone]   Fax:  [phone]

## 2014-07-28 ENCOUNTER — Encounter: Payer: Self-pay | Admitting: Physical Therapy

## 2014-07-28 ENCOUNTER — Ambulatory Visit: Payer: Federal, State, Local not specified - PPO | Admitting: Physical Therapy

## 2014-07-28 DIAGNOSIS — M79604 Pain in right leg: Secondary | ICD-10-CM | POA: Diagnosis not present

## 2014-07-28 DIAGNOSIS — M79605 Pain in left leg: Secondary | ICD-10-CM

## 2014-07-28 NOTE — Therapy (Signed)
[facility] [place], [place], [address] Phone: [phone]   Fax:  [phone]  Physical Therapy Treatment  Patient Details  Name: Jenna Ramos MRN: [id number] Date of Birth: Jun 06, 1928 Referring Provider:  Wardell Honour, MD  Encounter Date: 07/28/2014      PT End of Session - 07/28/14 1109    Visit Number 12   Number of Visits 12   Date for PT Re-Evaluation 07/17/14   PT Start Time 1033   PT Stop Time 1113   PT Time Calculation (min) 40 min   Activity Tolerance Patient tolerated treatment well   Behavior During Therapy [facility] for tasks assessed/performed      Past Medical History  Diagnosis Date  . Rectal incontinence   . Arthritis   . Fibromyalgia   . Glaucoma     uses Eye Drops daily  . Macular degeneration     wet and gets injections in both eyes  . HOH (hard of hearing)   . Stroke   . Hyperlipidemia     takes Atorvastatin daily  . Hypertension     takes Benazepril daily  . Hypothyroidism     takes Synthroid daily  . Anemia     takes Iron pill daily  . Peripheral vascular disease   . Nocturia   . Carotid artery occlusion   . Fibromyalgia     Past Surgical History  Procedure Laterality Date  . Tubal ligation    . Appendectomy    . Tonsillectomy    . Cataract surgery Bilateral   . Colonoscopy    . Esophagogastroduodenoscopy    . Wrist surgery      pins and screws  . Total hip arthroplasty Left 10/2013  . Endarterectomy Left 02/19/2014    Procedure: ENDARTERECTOMY CAROTID;  Surgeon: Conrad St. George, MD;  Location: [place];  Service: Vascular;  Laterality: Left;  . Carotid endarterectomy      There were no vitals filed for this visit.  Visit Diagnosis:  Right leg pain  Left leg pain      Subjective Assessment - 07/28/14 1040    Subjective no complaints after last treatment , feeling good today   Patient Stated Goals want balance and strength to better   Currently in Pain?  No/denies            [facility] PT Assessment - 07/28/14 0001    Strength   Overall Strength Comments Left hip abduction= 4/5.                     [place] Adult PT Treatment/Exercise - 07/28/14 0001    Knee/Hip Exercises: Aerobic   Stationary Bike Nustep L5 46mn UE/LE   Knee/Hip Exercises: Machines for Strengthening   Cybex Knee Extension 10# 2 x10   Cybex Knee Flexion 30# 3x10   Knee/Hip Exercises: Standing   Hip ADduction Both;2 sets;10 reps;Strengthening   Lateral Step Up Both;2 sets;10 reps;Step Height: 6"   Forward Step Up Both;2 sets;10 reps;Step Height: 6"                     PT Long Term Goals - 07/28/14 1111    PT LONG TERM GOAL #1   Title Independent with HEP.   Time 8   Period Weeks   Status Achieved   PT LONG TERM GOAL #2   Title Perform ADL's with pain not > 2-3/10   Time 8   Period Weeks  Status Achieved   PT LONG TERM GOAL #3   Title Increase left hip abduction strength to 5/5.   Time 8   Period Weeks   Status Not Met  4/5               Plan - 07/28/14 1109    Clinical Impression Statement patient has reported 75% improvement overall, she feels like legs are stronger and the only difficulty she has with ADL's is bending down to pick up an object. patient is inderpndent with all HEP and activities. Met all golas except strength for left  hip abd.   Pt will benefit from skilled therapeutic intervention in order to improve on the following deficits Decreased strength;Decreased activity tolerance;Decreased balance   Rehab Potential Excellent   PT Frequency 2x / week   PT Duration 8 weeks   PT Treatment/Interventions ADLs/Self Care Home Management;Balance training;Neuromuscular re-education   PT Next Visit Plan going to Dr Case today and will continue or DC pending MD   Consulted and Agree with Plan of Care Patient        Problem List Patient Active Problem List   Diagnosis Date Noted  . Essential hypertension  05/04/2014  . Hip pain 03/31/2014  . TIA (transient ischemic attack) 01/23/2014  . Carotid stenosis 01/14/2014  . 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 Fibromyalgia muscle pain 10/28/2013  . Acute blood loss anemia 10/28/2013  . Hip fracture requiring operative repair 10/17/2013  . Anemia    Ladean Raya, PTA 07/28/2014 11:13 AM Aima Mcwhirt P 07/28/2014, 11:13 AM  [facility] [address] [place], [place], [address] Phone: [phone]   Fax:  [phone]

## 2014-08-12 ENCOUNTER — Encounter (INDEPENDENT_AMBULATORY_CARE_PROVIDER_SITE_OTHER): Payer: Federal, State, Local not specified - PPO | Admitting: Ophthalmology

## 2014-08-13 ENCOUNTER — Encounter (INDEPENDENT_AMBULATORY_CARE_PROVIDER_SITE_OTHER): Payer: Federal, State, Local not specified - PPO | Admitting: Ophthalmology

## 2014-08-13 DIAGNOSIS — H43813 Vitreous degeneration, bilateral: Secondary | ICD-10-CM

## 2014-08-13 DIAGNOSIS — H35033 Hypertensive retinopathy, bilateral: Secondary | ICD-10-CM | POA: Diagnosis not present

## 2014-08-13 DIAGNOSIS — I10 Essential (primary) hypertension: Secondary | ICD-10-CM | POA: Diagnosis not present

## 2014-08-13 DIAGNOSIS — H3532 Exudative age-related macular degeneration: Secondary | ICD-10-CM | POA: Diagnosis not present

## 2014-09-10 ENCOUNTER — Ambulatory Visit: Payer: Federal, State, Local not specified - PPO | Admitting: Family Medicine

## 2014-09-10 ENCOUNTER — Encounter (INDEPENDENT_AMBULATORY_CARE_PROVIDER_SITE_OTHER): Payer: Federal, State, Local not specified - PPO | Admitting: Ophthalmology

## 2014-09-10 DIAGNOSIS — H43813 Vitreous degeneration, bilateral: Secondary | ICD-10-CM | POA: Diagnosis not present

## 2014-09-10 DIAGNOSIS — I10 Essential (primary) hypertension: Secondary | ICD-10-CM | POA: Diagnosis not present

## 2014-09-10 DIAGNOSIS — H35033 Hypertensive retinopathy, bilateral: Secondary | ICD-10-CM | POA: Diagnosis not present

## 2014-09-10 DIAGNOSIS — H3532 Exudative age-related macular degeneration: Secondary | ICD-10-CM | POA: Diagnosis not present

## 2014-09-16 ENCOUNTER — Encounter: Payer: Self-pay | Admitting: Family Medicine

## 2014-09-16 ENCOUNTER — Ambulatory Visit (INDEPENDENT_AMBULATORY_CARE_PROVIDER_SITE_OTHER): Payer: Federal, State, Local not specified - PPO | Admitting: Family Medicine

## 2014-09-16 VITALS — BP 167/65 | HR 66 | Temp 98.2°F | Ht 63.0 in | Wt 126.0 lb

## 2014-09-16 DIAGNOSIS — E785 Hyperlipidemia, unspecified: Secondary | ICD-10-CM | POA: Diagnosis not present

## 2014-09-16 DIAGNOSIS — M797 Fibromyalgia: Secondary | ICD-10-CM

## 2014-09-16 DIAGNOSIS — I10 Essential (primary) hypertension: Secondary | ICD-10-CM

## 2014-09-16 DIAGNOSIS — E039 Hypothyroidism, unspecified: Secondary | ICD-10-CM

## 2014-09-16 NOTE — Patient Instructions (Signed)
Continue current medications. Continue good therapeutic lifestyle changes which include good diet and exercise. Fall precautions discussed with patient. If an FOBT was given today- please return it to our front desk. If you are over 79 years old - you may need Prevnar 59 or the adult Pneumonia vaccine.   After your visit with [place] today you will receive a survey in the mail or online from [employer] regarding your care with [place]. Please take a moment to fill this out. Your feedback is very important to [place] as you can help [place] better understand your patient needs as well as improve your experience and satisfaction.

## 2014-09-16 NOTE — Progress Notes (Signed)
Subjective:    Patient ID: Jenna Ramos, female    DOB: May 26, 1928, 81 y.o.   MRN: [id number]  HPI 81 year old female who is followed for hypertension, hypothyroidism, and hyperlipidemia. Today she brings in a record of her blood pressures which have all been good at home. She checks pressures weekly. We increased her benazepril at her last visit here. She denies any new problems but she does have some paresthesias in her left hand. She had surgery at that wrist and apparently has a plate. Orthopedic doctor ordered MRI of her spine, the results of which are pending.  Patient Active Problem List   Diagnosis Date Noted  . Essential hypertension 05/04/2014  . Hip pain 03/31/2014  . TIA (transient ischemic attack) 01/23/2014  . Carotid stenosis 01/14/2014  . 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 Fibromyalgia muscle pain 10/28/2013  . Acute blood loss anemia 10/28/2013  . Hip fracture requiring operative repair 10/17/2013  . Anemia    Outpatient Encounter Prescriptions as of 09/16/2014  Medication Sig  . amLODipine (NORVASC) 5 MG tablet Take 1 tablet (5 mg total) by mouth daily.  Marland Kitchen aspirin 325 MG EC tablet Take 325 mg by mouth.  Marland Kitchen atorvastatin (LIPITOR) 40 MG tablet Take 1 tablet (40 mg total) by mouth daily.  . benazepril (LOTENSIN) 20 MG tablet Take 1 tablet (20 mg total) by mouth daily.  Marland Kitchen BESIVANCE 0.6 % SUSP Place 1 drop into both eyes See admin instructions. Take day of eye shots and the day after  . bimatoprost (LUMIGAN) 0.03 % ophthalmic solution Place 1 drop into both eyes at bedtime.  . Calcium Carbonate-Vitamin D (CALCIUM 600/VITAMIN D) 600-400 MG-UNIT per tablet Take 1 tablet by mouth 3 (three) times daily with meals.   Marland Kitchen FERREX 150 150 MG capsule TAKE 1 CAPSULE BY MOUTH TWICE A DAY BEFORE LUNCH AND SUPPER AS INSTRUCTED  . levothyroxine (SYNTHROID, LEVOTHROID) 88 MCG tablet Take 44-88 mcg by mouth daily. 1 daily Mon-Fri and 1/2 tab on  Sat-Sun  . Multiple Vitamins-Minerals (MULTIVITAMINS THER. W/MINERALS) TABS Take 1 tablet by mouth daily.  . Omega-3 Fatty Acids (FISH OIL) 1200 MG CAPS Take 1 capsule by mouth 3 (three) times daily with meals.  Marland Kitchen oxyCODONE (OXY IR/ROXICODONE) 5 MG immediate release tablet Take 1 tablet (5 mg total) by mouth every 6 (six) hours as needed for severe pain.   No facility-administered encounter medications on file as of 09/16/2014.      Review of Systems  Constitutional: Negative.   HENT: Negative.   Respiratory: Negative.   Cardiovascular: Negative.   Gastrointestinal: Negative.   Genitourinary: Negative.   Neurological: Positive for numbness.  Psychiatric/Behavioral: Negative.        Objective:   Physical Exam  Constitutional: She is oriented to person, place, and time. She appears well-developed and well-nourished.  Cardiovascular: Normal rate.   Pulmonary/Chest: Effort normal and breath sounds normal.  Abdominal: Soft. There is no tenderness.  Neurological: She is alert and oriented to person, place, and time.  Psychiatric: She has a normal mood and affect.    BP 167/65 mmHg  Pulse 66  Temp(Src) 98.2 F (36.8 C) (Oral)  Ht 5' 3"  (1.6 m)  Wt 126 lb (57.153 kg)  BMI 22.33 kg/m2       Assessment & Plan:  1. Fibromyalgia muscle pain The lungs are well managed at this time. Continue as before  2. Essential hypertension Although blood pressure is up here this morning  it has been good at home. We'll continue with same amlodipine and benazepril  3. Hypothyroidism, unspecified hypothyroidism type Annual recheck of thyroid function to be done today - TSH  4. Hyperlipidemia Denies side effects with statin. Annual check is due today - Lipid panel - CMP14+EGFR

## 2014-09-17 ENCOUNTER — Telehealth: Payer: Self-pay | Admitting: *Deleted

## 2014-09-17 LAB — CMP14+EGFR
ALT: 19 IU/L (ref 0–32)
AST: 23 IU/L (ref 0–40)
Albumin/Globulin Ratio: 1.8 (ref 1.1–2.5)
Albumin: 4.4 g/dL (ref 3.5–4.7)
Alkaline Phosphatase: 73 IU/L (ref 39–117)
BILIRUBIN TOTAL: 0.5 mg/dL (ref 0.0–1.2)
BUN/Creatinine Ratio: 28 — ABNORMAL HIGH (ref 11–26)
BUN: 21 mg/dL (ref 8–27)
CALCIUM: 9.6 mg/dL (ref 8.7–10.3)
CO2: 25 mmol/L (ref 18–29)
Chloride: 101 mmol/L (ref 97–108)
Creatinine, Ser: 0.75 mg/dL (ref 0.57–1.00)
GFR calc non Af Amer: 73 mL/min/{1.73_m2} (ref >59)
GFR, EST AFRICAN AMERICAN: 85 mL/min/{1.73_m2} (ref >59)
GLUCOSE: 117 mg/dL — AB (ref 65–99)
Globulin, Total: 2.5 g/dL (ref 1.5–4.5)
Potassium: 4.3 mmol/L (ref 3.5–5.2)
SODIUM: 145 mmol/L — AB (ref 134–144)
TOTAL PROTEIN: 6.9 g/dL (ref 6.0–8.5)

## 2014-09-17 LAB — LIPID PANEL
CHOLESTEROL TOTAL: 147 mg/dL (ref 100–199)
Chol/HDL Ratio: 2.5 ratio units (ref 0.0–4.4)
HDL: 60 mg/dL (ref >39)
LDL Calculated: 61 mg/dL (ref 0–99)
TRIGLYCERIDES: 131 mg/dL (ref 0–149)
VLDL CHOLESTEROL CAL: 26 mg/dL (ref 5–40)

## 2014-09-17 LAB — TSH: TSH: 1.02 u[IU]/mL (ref 0.450–4.500)

## 2014-09-17 NOTE — Telephone Encounter (Signed)
-----   Message from Wardell Honour, MD sent at 09/17/2014  7:59 AM EDT ----- Total cholesterol is good at 147 and bad cholesterol LDL was very good at 61. HDL good cholesterol is at a good level; chemistries show sugar and the prediabetes range and I would suggest watching her carbohydrate intake, but otherwise liver function studies and renal functions are normalw

## 2014-09-17 NOTE — Telephone Encounter (Signed)
Pt notified of results Verbalizes understanding 

## 2014-09-28 ENCOUNTER — Other Ambulatory Visit: Payer: Self-pay | Admitting: *Deleted

## 2014-09-28 ENCOUNTER — Telehealth: Payer: Self-pay | Admitting: Family Medicine

## 2014-09-28 MED ORDER — LEVOTHYROXINE SODIUM 88 MCG PO TABS: 88.0000 ug | ORAL_TABLET | Freq: Every day | ORAL | Status: DC

## 2014-09-28 NOTE — Telephone Encounter (Signed)
Instructed pt on a few foods to limit/avoid and will send her a food list in the mail

## 2014-10-12 ENCOUNTER — Other Ambulatory Visit: Payer: Self-pay | Admitting: Family Medicine

## 2014-10-13 ENCOUNTER — Encounter (INDEPENDENT_AMBULATORY_CARE_PROVIDER_SITE_OTHER): Payer: Federal, State, Local not specified - PPO | Admitting: Ophthalmology

## 2014-10-13 DIAGNOSIS — H3532 Exudative age-related macular degeneration: Secondary | ICD-10-CM | POA: Diagnosis not present

## 2014-10-13 DIAGNOSIS — H43813 Vitreous degeneration, bilateral: Secondary | ICD-10-CM

## 2014-10-13 DIAGNOSIS — I10 Essential (primary) hypertension: Secondary | ICD-10-CM | POA: Diagnosis not present

## 2014-10-13 DIAGNOSIS — H35033 Hypertensive retinopathy, bilateral: Secondary | ICD-10-CM | POA: Diagnosis not present

## 2014-11-05 ENCOUNTER — Ambulatory Visit (INDEPENDENT_AMBULATORY_CARE_PROVIDER_SITE_OTHER): Payer: Federal, State, Local not specified - PPO | Admitting: Family Medicine

## 2014-11-05 ENCOUNTER — Encounter: Payer: Self-pay | Admitting: Family Medicine

## 2014-11-05 VITALS — BP 176/68 | HR 60 | Temp 96.7°F | Ht 63.0 in | Wt 122.4 lb

## 2014-11-05 DIAGNOSIS — M797 Fibromyalgia: Secondary | ICD-10-CM | POA: Diagnosis not present

## 2014-11-05 DIAGNOSIS — I6522 Occlusion and stenosis of left carotid artery: Secondary | ICD-10-CM

## 2014-11-05 DIAGNOSIS — I10 Essential (primary) hypertension: Secondary | ICD-10-CM | POA: Diagnosis not present

## 2014-11-05 DIAGNOSIS — E039 Hypothyroidism, unspecified: Secondary | ICD-10-CM

## 2014-11-05 NOTE — Progress Notes (Signed)
Subjective:    Patient ID: Jenna Ramos, female    DOB: 24-Nov-1928, 81 y.o.   MRN: [id number]  HPI  81 year old female who is here to follow-up carotid stenosis, fibromyalgia, hypertension, and hyperlipidemia. She has been doing well lately. There've been no falls. She feels a strange sensation in her neck where she had the carotid endarterectomy. She does have some paresthesias in the left arm is going to see specialist about that next week. She uses oxycodone occasionally for hip pain or other muscular pain related to fibromyalgia Patient Active Problem List   Diagnosis Date Noted  . Hypothyroidism   . Hyperlipidemia   . Essential hypertension 05/04/2014  . Hip pain 03/31/2014  . TIA (transient ischemic attack) 01/23/2014  . Carotid stenosis 01/14/2014  . 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 Fibromyalgia muscle pain 10/28/2013  . Acute blood loss anemia 10/28/2013  . Hip fracture requiring operative repair 10/17/2013  . Anemia    Outpatient Encounter Prescriptions as of 11/05/2014  Medication Sig  . amLODipine (NORVASC) 5 MG tablet Take 1 tablet (5 mg total) by mouth daily.  Marland Kitchen aspirin 325 MG EC tablet Take 325 mg by mouth.  Marland Kitchen atorvastatin (LIPITOR) 40 MG tablet Take 1 tablet (40 mg total) by mouth daily.  . benazepril (LOTENSIN) 20 MG tablet Take 1 tablet (20 mg total) by mouth daily.  . bimatoprost (LUMIGAN) 0.03 % ophthalmic solution Place 1 drop into both eyes at bedtime.  Marland Kitchen FERREX 150 150 MG capsule TAKE 1 CAPSULE BY MOUTH TWICE A DAY BEFORE LUNCH AND SUPPER AS INSTRUCTED  . levothyroxine (SYNTHROID, LEVOTHROID) 88 MCG tablet Take 1 tablet (88 mcg total) by mouth daily. As directed  . Multiple Vitamins-Minerals (MULTIVITAMINS THER. W/MINERALS) TABS Take 1 tablet by mouth daily.  . Omega-3 Fatty Acids (FISH OIL) 1200 MG CAPS Take 1 capsule by mouth 3 (three) times daily with meals.  Marland Kitchen oxyCODONE (OXY IR/ROXICODONE) 5 MG immediate release  tablet Take 1 tablet (5 mg total) by mouth every 6 (six) hours as needed for severe pain.  Marland Kitchen BESIVANCE 0.6 % SUSP Place 1 drop into both eyes See admin instructions. Take day of eye shots and the day after  . Calcium Carbonate-Vitamin D (CALCIUM 600/VITAMIN D) 600-400 MG-UNIT per tablet Take 1 tablet by mouth 3 (three) times daily with meals.    No facility-administered encounter medications on file as of 11/05/2014.      Review of Systems  Constitutional: Negative.   Respiratory: Negative.   Cardiovascular: Negative.   Neurological: Negative.   Psychiatric/Behavioral: Negative.        Objective:   Physical Exam  Constitutional: She is oriented to person, place, and time. She appears well-developed and well-nourished.  Cardiovascular: Normal rate and regular rhythm.   Pulmonary/Chest: Breath sounds normal.  Neurological: She is alert and oriented to person, place, and time.  Psychiatric: She has a normal mood and affect. Her behavior is normal.          Assessment & Plan:  1. Essential hypertension Blood pressure here is not well controlled but she brings in a recording at home. She did compare her blood pressure cuff to ours at her last visit and they were in agreement. Blood pressures at home are generally around 120 or below and 37-16 diastolic so I would not change her medicine 1. Essential hypertension * 2. Carotid stenosis, left I can hear no bruit in her carotid today and flow appears good  3. Hypothyroidism, unspecified hypothyroidism type TSH was at goal in July. No changes are recommended  4. Fibromyalgia muscle pain Using oxycodone infrequently. Will continue to do so  Wardell Honour MD

## 2014-11-10 ENCOUNTER — Ambulatory Visit (INDEPENDENT_AMBULATORY_CARE_PROVIDER_SITE_OTHER): Payer: Federal, State, Local not specified - PPO | Admitting: Physician Assistant

## 2014-11-10 ENCOUNTER — Encounter: Payer: Self-pay | Admitting: Physician Assistant

## 2014-11-10 VITALS — BP 149/58 | HR 67 | Temp 97.4°F | Ht 63.0 in | Wt 124.0 lb

## 2014-11-10 DIAGNOSIS — L570 Actinic keratosis: Secondary | ICD-10-CM | POA: Diagnosis not present

## 2014-11-10 DIAGNOSIS — D485 Neoplasm of uncertain behavior of skin: Secondary | ICD-10-CM

## 2014-11-10 NOTE — Progress Notes (Signed)
Patient ID: Jenna Ramos, female   DOB: 27-Aug-1928, 81 y.o.   MRN: [id number]  81 y/o female presents for new appearing lesion on right cheek. States that it scales and peels. No h/o skin cancer. Fair skin   Hyperkeratotic erythematous papule on right cheek with scale (.39mm-1cm). Biopsied via wide shave to r/o BCC/SCC. Will await pathology and treat accordingly. Other areas of actinic changes, AK's, noted on face and upper shoulders. Treated with cryosurgery x 6. Numerous benign appearing   Bandaid and monsels applied post biopsy. Advised patient to apply vaseline only.   1. Neoplasm of uncertain behavior of skin  - Pathology to r/o BCC/SCC  2. Actinic keratoses Treated with cryosurgery x 7 on face and shoulders.    RTO pending path results. Will likely refer to [place]. Benjamin Stain PA-C

## 2014-11-13 LAB — PATHOLOGY

## 2014-11-16 ENCOUNTER — Other Ambulatory Visit: Payer: Self-pay | Admitting: Physician Assistant

## 2014-11-16 DIAGNOSIS — C44310 Basal cell carcinoma of skin of unspecified parts of face: Secondary | ICD-10-CM

## 2014-11-17 ENCOUNTER — Ambulatory Visit: Payer: Federal, State, Local not specified - PPO | Attending: Anesthesiology | Admitting: Physical Therapy

## 2014-11-17 ENCOUNTER — Other Ambulatory Visit: Payer: Self-pay | Admitting: Family Medicine

## 2014-11-17 DIAGNOSIS — M25532 Pain in left wrist: Secondary | ICD-10-CM | POA: Insufficient documentation

## 2014-11-17 DIAGNOSIS — R202 Paresthesia of skin: Secondary | ICD-10-CM | POA: Diagnosis present

## 2014-11-17 DIAGNOSIS — R200 Anesthesia of skin: Secondary | ICD-10-CM

## 2014-11-17 NOTE — Therapy (Signed)
[facility] [place], [place], [address] Phone: [phone]   Fax:  [phone]  Physical Therapy Evaluation  Patient Details  Name: Jenna Ramos MRN: [id number] Date of Birth: 1928-11-02 Referring Provider:  Dorene Ar, MD  Encounter Date: 11/17/2014      PT End of Session - 11/17/14 1812    Visit Number 1   Number of Visits 12   Date for PT Re-Evaluation 01/05/15   PT Start Time 0153   PT Stop Time 0242   PT Time Calculation (min) 49 min   Activity Tolerance Patient tolerated treatment well   Behavior During Therapy [facility] for tasks assessed/performed      Past Medical History  Diagnosis Date  . Rectal incontinence   . Arthritis   . Fibromyalgia   . Glaucoma     uses Eye Drops daily  . Macular degeneration     wet and gets injections in both eyes  . HOH (hard of hearing)   . Stroke   . Hyperlipidemia     takes Atorvastatin daily  . Hypertension     takes Benazepril daily  . Anemia     takes Iron pill daily  . Peripheral vascular disease   . Nocturia   . Carotid artery occlusion   . Fibromyalgia   . Hypothyroidism     takes Synthroid daily    Past Surgical History  Procedure Laterality Date  . Tubal ligation    . Appendectomy    . Tonsillectomy    . Cataract surgery Bilateral   . Colonoscopy    . Esophagogastroduodenoscopy    . Wrist surgery      pins and screws  . Total hip arthroplasty Left 10/2013  . Endarterectomy Left 02/19/2014    Procedure: ENDARTERECTOMY CAROTID;  Surgeon: Conrad Montara, MD;  Location: [place];  Service: Vascular;  Laterality: Left;  . Carotid endarterectomy      There were no vitals filed for this visit.  Visit Diagnosis:  Left wrist pain - Plan: PT plan of care cert/re-cert  Numbness and tingling in left hand - Plan: PT plan of care cert/re-cert      Subjective Assessment - 11/17/14 1406    Subjective Dr. wants me to get some therapy prior to an  injection in the neck.  Had surgery about a year ago.     Patient Stated Goals Grasp objects with left hand without pain.   Currently in Pain? Yes   Pain Score 4    Pain Location Wrist   Pain Orientation Left   Pain Descriptors / Indicators Aching;Sharp   Pain Type Chronic pain   Pain Onset More than a month ago   Pain Frequency Intermittent   Aggravating Factors  Lifting objects with left hand.   Pain Relieving Factors Rest arm.   Multiple Pain Sites No            [facility] PT Assessment - 11/17/14 0001    Assessment   Medical Diagnosis Cervical DDD; Carpal tunnel of left wrist.   Onset Date/Surgical Date --  Ongoing.   Precautions   Precautions None   Restrictions   Weight Bearing Restrictions No   Balance Screen   Has the patient fallen in the past 6 months Yes   How many times? 2   Has the patient had a decrease in activity level because of a fear of falling?  No   Is the patient reluctant to leave their home because  of a fear of falling?  No   Home [employer] residence   Prior Function   Level of Independence Independent   Cognition   Overall Cognitive Status Within Functional Limits for tasks assessed   Posture/Postural Control   Posture/Postural Control Postural limitations   Postural Limitations Rounded Shoulders;Forward head   ROM / Strength   AROM / PROM / Strength AROM;Strength   AROM   Overall AROM Comments Right active cervicl rotation= 65 degrees and left= 40 degrees.  Active left wrist flexion and extension= 45 degrees.  Full forearm supination.   Strength   Overall Strength Comments Normal left UE strength and grip.   Palpation   Palpation comment C/o pain "in" neck with movement.     Special Tests    Special Tests Thoracic Outlet Syndrome  (-) left Phalen's and Tinel's test.  Normal UE DTR's.   Thoracic Outlet Syndrome  Adson Test   Adson Test   Findings Negative   Side  Left   Ambulation/Gait   Gait Comments WNL.                    [facility] Adult PT Treatment/Exercise - 11/17/14 0001    Modalities   Modalities Electrical Stimulation   Electrical Stimulation   Electrical Stimulation Location Left carpal tunnel region.   Electrical Stimulation Action 80-150 HZ x 20 minutes.   Electrical Stimulation Goals Pain                     PT Long Term Goals - 11/17/14 2019    PT LONG TERM GOAL #1   Title Ind with HEP.   Time 6   Period Weeks   PT LONG TERM GOAL #2   Title Grasp 8# weight with left hand and carry 100 feet with pain not > 2/10.   Time 6   Period Weeks   Status New   PT LONG TERM GOAL #3   Title No c/o left hand numbness.   Time 6   Period Weeks   Status New               Plan - 11/17/14 2003    Clinical Impression Statement The patient presnts to physical therapy with a diagnosis of cervical DDD and left carpal tunnel.  Her CC is that of pain in hand when grasping objects with her left hand (ie:  A gallon of milk).  She feels pain and numbess and must support the object with her right hand.  Pain is quite high during this time. (5-6/10).  She experiences numbness over the thumb and index finger of her left hand.    Pt will benefit from skilled therapeutic intervention in order to improve on the following deficits Pain;Decreased activity tolerance   Rehab Potential Good   PT Frequency 2x / week   PT Duration 6 weeks   PT Treatment/Interventions Moist Heat;Ultrasound;Therapeutic exercise;Patient/family education;Manual techniques;Passive range of motion   PT Next Visit Plan Left wrist extension stretch; STW/M; U/S.   Consulted and Agree with Plan of Care Patient         Problem List Patient Active Problem List   Diagnosis Date Noted  . Hypothyroidism   . Hyperlipidemia   . Essential hypertension 05/04/2014  . Hip pain 03/31/2014  . TIA (transient ischemic attack) 01/23/2014  . Carotid stenosis 01/14/2014  . UTI (urinary tract infection) due to  Morganella Morganii 10/28/2013  . Fibromyalgia muscle pain 10/28/2013  .  Acute blood loss anemia 10/28/2013  . Hip fracture requiring operative repair 10/17/2013  . Anemia     Paulette Rockford, [place] MPT 11/17/2014, 8:27 PM  [facility] [address] [place], [place], [address] Phone: [phone]   Fax:  [phone]

## 2014-11-19 ENCOUNTER — Encounter: Payer: Self-pay | Admitting: *Deleted

## 2014-11-19 ENCOUNTER — Ambulatory Visit: Payer: Federal, State, Local not specified - PPO | Admitting: *Deleted

## 2014-11-19 DIAGNOSIS — R200 Anesthesia of skin: Secondary | ICD-10-CM

## 2014-11-19 DIAGNOSIS — M25532 Pain in left wrist: Secondary | ICD-10-CM | POA: Diagnosis not present

## 2014-11-19 DIAGNOSIS — R202 Paresthesia of skin: Secondary | ICD-10-CM

## 2014-11-19 MED ORDER — OXYCODONE HCL 5 MG PO TABS: 5.0000 mg | ORAL_TABLET | Freq: Four times a day (QID) | ORAL | Status: DC | PRN

## 2014-11-19 NOTE — Therapy (Signed)
[facility] [place], [place], [address] Phone: [phone]   Fax:  [phone]  Physical Therapy Treatment  Patient Details  Name: Jenna Ramos MRN: [id number] Date of Birth: 1929/01/06 Referring Provider:  Wardell Honour, MD  Encounter Date: 11/19/2014      PT End of Session - 11/19/14 1718    Visit Number 2   Number of Visits 12   Date for PT Re-Evaluation 01/05/15   PT Start Time 2426   PT Stop Time 1600   PT Time Calculation (min) 45 min      Past Medical History  Diagnosis Date  . Rectal incontinence   . Arthritis   . Fibromyalgia   . Glaucoma     uses Eye Drops daily  . Macular degeneration     wet and gets injections in both eyes  . HOH (hard of hearing)   . Stroke   . Hyperlipidemia     takes Atorvastatin daily  . Hypertension     takes Benazepril daily  . Anemia     takes Iron pill daily  . Peripheral vascular disease   . Nocturia   . Carotid artery occlusion   . Fibromyalgia   . Hypothyroidism     takes Synthroid daily    Past Surgical History  Procedure Laterality Date  . Tubal ligation    . Appendectomy    . Tonsillectomy    . Cataract surgery Bilateral   . Colonoscopy    . Esophagogastroduodenoscopy    . Wrist surgery      pins and screws  . Total hip arthroplasty Left 10/2013  . Endarterectomy Left 02/19/2014    Procedure: ENDARTERECTOMY CAROTID;  Surgeon: Conrad [place], MD;  Location: [place];  Service: Vascular;  Laterality: Left;  . Carotid endarterectomy      There were no vitals filed for this visit.  Visit Diagnosis:  Left wrist pain  Numbness and tingling in left hand      Subjective Assessment - 11/19/14 1545    Subjective Dr. wants me to get some therapy prior to an injection in the neck.  Had surgery about a year ago on LT wrist due to fracture.    Patient Stated Goals Grasp objects with left hand without pain.   Currently in Pain? Yes   Pain Score 4     Pain Location Wrist   Pain Orientation Left   Pain Descriptors / Indicators Aching;Sharp   Pain Type Chronic pain   Pain Onset More than a month ago   Pain Frequency Intermittent   Aggravating Factors  lifting objects   Pain Relieving Factors rest                         [facility] Adult PT Treatment/Exercise - 11/19/14 0001    Modalities   Modalities Electrical Stimulation;Ultrasound   Electrical Stimulation   Electrical Stimulation Location Left carpal tunnel region.80-150hz  x 15 mins   Electrical Stimulation Goals Pain   Ultrasound   Ultrasound Location LT palm area   Ultrasound Parameters .5 w/cm2 x 8 mins, 3.3  mhz   Ultrasound Goals Pain   Manual Therapy   Manual Therapy Passive ROM;Soft tissue mobilization   Soft tissue mobilization STW to Pt.'s entire palm and along wrist and old incision site   Passive ROM Passive stretching into wrist extension  PT Long Term Goals - 11/17/14 2019    PT LONG TERM GOAL #1   Title Ind with HEP.   Time 6   Period Weeks   PT LONG TERM GOAL #2   Title Grasp 8# weight with left hand and carry 100 feet with pain not > 2/10.   Time 6   Period Weeks   Status New   PT LONG TERM GOAL #3   Title No c/o left hand numbness.   Time 6   Period Weeks   Status New               Plan - 11/19/14 1722    Clinical Impression Statement The pt did fairly well today with Rx, but had a feww pains in her hand/wrist when changing positions from flexion/extension and supination/pronation. She did well with STW and passive extension stretching.. Goals are ongoing   Pt will benefit from skilled therapeutic intervention in order to improve on the following deficits Pain;Decreased activity tolerance   Rehab Potential Good   PT Frequency 2x / week   PT Duration 6 weeks   PT Treatment/Interventions Moist Heat;Ultrasound;Therapeutic exercise;Patient/family education;Manual techniques;Passive range of  motion   PT Next Visit Plan Left wrist extension stretch; STW/M; U/S.   Consulted and Agree with Plan of Care Patient        Problem List Patient Active Problem List   Diagnosis Date Noted  . Hypothyroidism   . Hyperlipidemia   . Essential hypertension 05/04/2014  . Hip pain 03/31/2014  . TIA (transient ischemic attack) 01/23/2014  . Carotid stenosis 01/14/2014  . 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 Fibromyalgia muscle pain 10/28/2013  . Acute blood loss anemia 10/28/2013  . Hip fracture requiring operative repair 10/17/2013  . Anemia     RAMSEUR,CHRIS, PTA 11/19/2014, 5:29 PM  [facility] [address] [place], [place], [address] Phone: [phone]   Fax:  [phone]

## 2014-11-23 ENCOUNTER — Ambulatory Visit: Payer: Federal, State, Local not specified - PPO | Admitting: Physical Therapy

## 2014-11-23 ENCOUNTER — Other Ambulatory Visit: Payer: Self-pay | Admitting: Family Medicine

## 2014-11-23 DIAGNOSIS — M25532 Pain in left wrist: Secondary | ICD-10-CM | POA: Diagnosis not present

## 2014-11-23 DIAGNOSIS — R202 Paresthesia of skin: Secondary | ICD-10-CM

## 2014-11-23 DIAGNOSIS — R200 Anesthesia of skin: Secondary | ICD-10-CM

## 2014-11-23 NOTE — Therapy (Signed)
[facility] [place], [place], [address] Phone: [phone]   Fax:  [phone]  Physical Therapy Treatment  Patient Details  Name: Jenna Ramos MRN: [id number] Date of Birth: March 11, 1928 Referring Provider:  Wardell Honour, MD  Encounter Date: 11/23/2014      PT End of Session - 11/23/14 1747    Visit Number 3   Number of Visits 12   Date for PT Re-Evaluation 01/05/15   PT Start Time 0230   PT Stop Time 0314   PT Time Calculation (min) 44 min   Activity Tolerance Patient tolerated treatment well   Behavior During Therapy [facility] for tasks assessed/performed      Past Medical History  Diagnosis Date  . Rectal incontinence   . Arthritis   . Fibromyalgia   . Glaucoma     uses Eye Drops daily  . Macular degeneration     wet and gets injections in both eyes  . HOH (hard of hearing)   . Stroke   . Hyperlipidemia     takes Atorvastatin daily  . Hypertension     takes Benazepril daily  . Anemia     takes Iron pill daily  . Peripheral vascular disease   . Nocturia   . Carotid artery occlusion   . Fibromyalgia   . Hypothyroidism     takes Synthroid daily    Past Surgical History  Procedure Laterality Date  . Tubal ligation    . Appendectomy    . Tonsillectomy    . Cataract surgery Bilateral   . Colonoscopy    . Esophagogastroduodenoscopy    . Wrist surgery      pins and screws  . Total hip arthroplasty Left 10/2013  . Endarterectomy Left 02/19/2014    Procedure: ENDARTERECTOMY CAROTID;  Surgeon: Conrad [place], MD;  Location: [place];  Service: Vascular;  Laterality: Left;  . Carotid endarterectomy      There were no vitals filed for this visit.  Visit Diagnosis:  Left wrist pain  Numbness and tingling in left hand      Subjective Assessment - 11/23/14 1745    Subjective Opened refrigerator door and felt very sharp pain in my left wrist and hand.   Patient Stated Goals Grasp objects with left  hand without pain.                         [facility] Adult PT Treatment/Exercise - 11/23/14 1746    Electrical Stimulation   Electrical Stimulation Location Left carpal tunnel region.80-150hz  x 15 mins   Electrical Stimulation Action Pre-mod at 80-150 HZ x 15 minutes.   Ultrasound   Ultrasound Location Left carpal tunnel region at 1.20 W/CM2 x 11 minutes at 50% 3.3 MHz   Manual Therapy   Soft tissue mobilization STW/M and pisiform mobilization of left wrist x 12 minutes.                     PT Long Term Goals - 11/17/14 2019    PT LONG TERM GOAL #1   Title Ind with HEP.   Time 6   Period Weeks   PT LONG TERM GOAL #2   Title Grasp 8# weight with left hand and carry 100 feet with pain not > 2/10.   Time 6   Period Weeks   Status New   PT LONG TERM GOAL #3   Title No c/o left hand numbness.  Time 6   Period Weeks   Status New               Problem List Patient Active Problem List   Diagnosis Date Noted  . Hypothyroidism   . Hyperlipidemia   . Essential hypertension 05/04/2014  . Hip pain 03/31/2014  . TIA (transient ischemic attack) 01/23/2014  . Carotid stenosis 01/14/2014  . 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 Fibromyalgia muscle pain 10/28/2013  . Acute blood loss anemia 10/28/2013  . Hip fracture requiring operative repair 10/17/2013  . Anemia     APPLEGATE, [place] MPT 11/23/2014, 5:52 PM  [facility] [address] [place], [place], [address] Phone: [phone]   Fax:  [phone]

## 2014-11-25 ENCOUNTER — Encounter: Payer: Federal, State, Local not specified - PPO | Admitting: Physical Therapy

## 2014-11-26 ENCOUNTER — Encounter (INDEPENDENT_AMBULATORY_CARE_PROVIDER_SITE_OTHER): Payer: Federal, State, Local not specified - PPO | Admitting: Ophthalmology

## 2014-11-26 DIAGNOSIS — H35033 Hypertensive retinopathy, bilateral: Secondary | ICD-10-CM

## 2014-11-26 DIAGNOSIS — I10 Essential (primary) hypertension: Secondary | ICD-10-CM | POA: Diagnosis not present

## 2014-11-26 DIAGNOSIS — H3532 Exudative age-related macular degeneration: Secondary | ICD-10-CM

## 2014-11-26 DIAGNOSIS — H43813 Vitreous degeneration, bilateral: Secondary | ICD-10-CM

## 2014-11-30 ENCOUNTER — Other Ambulatory Visit: Payer: Self-pay | Admitting: Family Medicine

## 2014-11-30 ENCOUNTER — Ambulatory Visit: Payer: Federal, State, Local not specified - PPO | Admitting: Physical Therapy

## 2014-11-30 ENCOUNTER — Encounter: Payer: Self-pay | Admitting: Physical Therapy

## 2014-11-30 DIAGNOSIS — M25532 Pain in left wrist: Secondary | ICD-10-CM | POA: Diagnosis not present

## 2014-11-30 DIAGNOSIS — R200 Anesthesia of skin: Secondary | ICD-10-CM

## 2014-11-30 DIAGNOSIS — R202 Paresthesia of skin: Secondary | ICD-10-CM

## 2014-11-30 NOTE — Therapy (Signed)
[facility] [place], [place], [address] Phone: [phone]   Fax:  [phone]  Physical Therapy Treatment  Patient Details  Name: Jenna Ramos MRN: [id number] Date of Birth: January 11, 1929 Referring Salley Boxley:  Wardell Honour, MD  Encounter Date: 11/30/2014      PT End of Session - 11/30/14 1244    Visit Number 4   Number of Visits 12   Date for PT Re-Evaluation 01/05/15   PT Start Time 1230   PT Stop Time 1311   PT Time Calculation (min) 41 min   Activity Tolerance Patient tolerated treatment well   Behavior During Therapy [facility] for tasks assessed/performed      Past Medical History  Diagnosis Date  . Rectal incontinence   . Arthritis   . Fibromyalgia   . Glaucoma     uses Eye Drops daily  . Macular degeneration     wet and gets injections in both eyes  . HOH (hard of hearing)   . Stroke   . Hyperlipidemia     takes Atorvastatin daily  . Hypertension     takes Benazepril daily  . Anemia     takes Iron pill daily  . Peripheral vascular disease   . Nocturia   . Carotid artery occlusion   . Fibromyalgia   . Hypothyroidism     takes Synthroid daily    Past Surgical History  Procedure Laterality Date  . Tubal ligation    . Appendectomy    . Tonsillectomy    . Cataract surgery Bilateral   . Colonoscopy    . Esophagogastroduodenoscopy    . Wrist surgery      pins and screws  . Total hip arthroplasty Left 10/2013  . Endarterectomy Left 02/19/2014    Procedure: ENDARTERECTOMY CAROTID;  Surgeon: Conrad [place], MD;  Location: [place];  Service: Vascular;  Laterality: Left;  . Carotid endarterectomy      There were no vitals filed for this visit.  Visit Diagnosis:  Left wrist pain  Numbness and tingling in left hand      Subjective Assessment - 11/30/14 1232    Subjective wrist feeling better   Patient Stated Goals Grasp objects with left hand without pain.   Currently in Pain? Yes   Pain  Score 2    Pain Location Wrist   Pain Orientation Left   Pain Descriptors / Indicators Aching;Sharp   Pain Type Chronic pain   Pain Onset More than a month ago   Pain Frequency Intermittent   Aggravating Factors  lifting   Pain Relieving Factors rest                         [facility] Adult PT Treatment/Exercise - 11/30/14 0001    Electrical Stimulation   Electrical Stimulation Location Left carpal tunnel region   Electrical Stimulation Action premod   Electrical Stimulation Parameters 1-10hz    Electrical Stimulation Goals Pain   Ultrasound   Ultrasound Location left carpal tunnel region    Ultrasound Parameters 1.2w/cm2/50%/3.72mhz x49min   Ultrasound Goals Pain   Manual Therapy   Soft tissue mobilization STW/M /mobilization of left wrist with wrist stretching                     PT Long Term Goals - 11/30/14 1244    PT LONG TERM GOAL #1   Title Ind with HEP.   Time 6   Period Weeks  Status Achieved   PT LONG TERM GOAL #2   Title Grasp 8# weight with left hand and carry 100 feet with pain not > 2/10.   Time 6   Period Weeks   Status On-going   PT LONG TERM GOAL #3   Title No c/o left hand numbness.   Time 6   Period Weeks   Status On-going               Plan - 11/30/14 1246    Clinical Impression Statement Patient continues to progress slowly, has reported less pain today yet continues to have c/o tingling sesation in left hand. patient has some difficulty with ADL's and use of left wrist. Unable to meet any further goals due to pain and symptoms in left wrist.   Pt will benefit from skilled therapeutic intervention in order to improve on the following deficits Pain;Decreased activity tolerance   Rehab Potential Good   PT Frequency 2x / week   PT Duration 6 weeks   PT Treatment/Interventions Moist Heat;Ultrasound;Therapeutic exercise;Patient/family education;Manual techniques;Passive range of motion   PT Next Visit Plan Left wrist  extension stretch; STW/M; U/S.   Consulted and Agree with Plan of Care Patient        Problem List Patient Active Problem List   Diagnosis Date Noted  . Hypothyroidism   . Hyperlipidemia   . Essential hypertension 05/04/2014  . Hip pain 03/31/2014  . TIA (transient ischemic attack) 01/23/2014  . Carotid stenosis 01/14/2014  . 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 Fibromyalgia muscle pain 10/28/2013  . Acute blood loss anemia 10/28/2013  . Hip fracture requiring operative repair 10/17/2013  . Anemia     DUNFORD, CHRISTINA P, PTA 11/30/2014, 1:11 PM  [facility] [address] [place], [place], [address] Phone: [phone]   Fax:  [phone]

## 2014-12-02 ENCOUNTER — Ambulatory Visit: Payer: Federal, State, Local not specified - PPO | Admitting: Physical Therapy

## 2014-12-02 ENCOUNTER — Encounter: Payer: Self-pay | Admitting: Physical Therapy

## 2014-12-02 DIAGNOSIS — M25532 Pain in left wrist: Secondary | ICD-10-CM

## 2014-12-02 DIAGNOSIS — R200 Anesthesia of skin: Secondary | ICD-10-CM

## 2014-12-02 DIAGNOSIS — R202 Paresthesia of skin: Secondary | ICD-10-CM

## 2014-12-02 NOTE — Therapy (Signed)
[facility] [place], [place], [address] Phone: [phone]   Fax:  [phone]  Physical Therapy Treatment  Patient Details  Name: Jenna Ramos MRN: [id number] Date of Birth: Jan 10, 1929 Referring Provider:  Wardell Honour, MD  Encounter Date: 12/02/2014      PT End of Session - 12/02/14 1327    Visit Number 5   Number of Visits 12   Date for PT Re-Evaluation 01/05/15   PT Start Time 1319   PT Stop Time 1359   PT Time Calculation (min) 40 min   Activity Tolerance Patient tolerated treatment well   Behavior During Therapy [facility] for tasks assessed/performed      Past Medical History  Diagnosis Date  . Rectal incontinence   . Arthritis   . Fibromyalgia   . Glaucoma     uses Eye Drops daily  . Macular degeneration     wet and gets injections in both eyes  . HOH (hard of hearing)   . Stroke   . Hyperlipidemia     takes Atorvastatin daily  . Hypertension     takes Benazepril daily  . Anemia     takes Iron pill daily  . Peripheral vascular disease   . Nocturia   . Carotid artery occlusion   . Fibromyalgia   . Hypothyroidism     takes Synthroid daily    Past Surgical History  Procedure Laterality Date  . Tubal ligation    . Appendectomy    . Tonsillectomy    . Cataract surgery Bilateral   . Colonoscopy    . Esophagogastroduodenoscopy    . Wrist surgery      pins and screws  . Total hip arthroplasty Left 10/2013  . Endarterectomy Left 02/19/2014    Procedure: ENDARTERECTOMY CAROTID;  Surgeon: Conrad [place], MD;  Location: [place];  Service: Vascular;  Laterality: Left;  . Carotid endarterectomy      There were no vitals filed for this visit.  Visit Diagnosis:  Left wrist pain  Numbness and tingling in left hand      Subjective Assessment - 12/02/14 1322    Subjective no compliants after last treatment   Patient Stated Goals Grasp objects with left hand without pain.   Currently in Pain?  Yes   Pain Score 2    Pain Location Wrist   Pain Orientation Left   Pain Descriptors / Indicators Aching;Tingling   Pain Type Chronic pain   Pain Onset More than a month ago   Pain Frequency Intermittent   Aggravating Factors  leaning on hand   Pain Relieving Factors rest                         [facility] Adult PT Treatment/Exercise - 12/02/14 0001    Exercises   Exercises Wrist   Wrist Exercises   Forearm Supination Strengthening;Left  with hammer 2x10   Wrist Flexion Strengthening;Left  1# 2x10   Wrist Extension Strengthening;Left  1# 2x10   Wrist Radial Deviation Strengthening;Left  1# 2x10   Electrical Stimulation   Electrical Stimulation Location Left carpal tunnel region   Electrical Stimulation Action premod   Electrical Stimulation Parameters 1-10hz    Electrical Stimulation Goals Pain   Ultrasound   Ultrasound Location left carpal tunnel area   Ultrasound Parameters 1.2w/cm2/50%/3.64mhz x23min   Ultrasound Goals Pain  PT Long Term Goals - 11/30/14 1244    PT LONG TERM GOAL #1   Title Ind with HEP.   Time 6   Period Weeks   Status Achieved   PT LONG TERM GOAL #2   Title Grasp 8# weight with left hand and carry 100 feet with pain not > 2/10.   Time 6   Period Weeks   Status On-going   PT LONG TERM GOAL #3   Title No c/o left hand numbness.   Time 6   Period Weeks   Status On-going               Plan - 12/02/14 1328    Clinical Impression Statement Patient progressing with all activities and started light strengthening per MPT today. patient reported no pain increase and tolerated treatment well. patient continues to have tingling sensation and soreness with leaning on left wrist. No further goals due to pain limitations.   Pt will benefit from skilled therapeutic intervention in order to improve on the following deficits Pain;Decreased activity tolerance   Rehab Potential Good   PT Frequency 2x / week    PT Duration 6 weeks   PT Treatment/Interventions Moist Heat;Ultrasound;Therapeutic exercise;Patient/family education;Manual techniques;Passive range of motion   PT Next Visit Plan cont with POC   Consulted and Agree with Plan of Care Patient        Problem List Patient Active Problem List   Diagnosis Date Noted  . Hypothyroidism   . Hyperlipidemia   . Essential hypertension 05/04/2014  . Hip pain 03/31/2014  . TIA (transient ischemic attack) 01/23/2014  . Carotid stenosis 01/14/2014  . 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 Fibromyalgia muscle pain 10/28/2013  . Acute blood loss anemia 10/28/2013  . Hip fracture requiring operative repair 10/17/2013  . Anemia     DUNFORD, CHRISTINA P, PTA 12/02/2014, 1:59 PM  [facility] [address] [place], [place], [address] Phone: [phone]   Fax:  [phone]

## 2014-12-07 ENCOUNTER — Encounter: Payer: Federal, State, Local not specified - PPO | Admitting: Physical Therapy

## 2014-12-09 ENCOUNTER — Encounter: Payer: Federal, State, Local not specified - PPO | Admitting: Physical Therapy

## 2014-12-14 ENCOUNTER — Encounter: Payer: Self-pay | Admitting: Physical Therapy

## 2014-12-14 ENCOUNTER — Ambulatory Visit: Payer: Federal, State, Local not specified - PPO | Attending: Anesthesiology | Admitting: Physical Therapy

## 2014-12-14 DIAGNOSIS — M25532 Pain in left wrist: Secondary | ICD-10-CM | POA: Diagnosis not present

## 2014-12-14 DIAGNOSIS — R202 Paresthesia of skin: Secondary | ICD-10-CM | POA: Insufficient documentation

## 2014-12-14 DIAGNOSIS — R200 Anesthesia of skin: Secondary | ICD-10-CM

## 2014-12-14 NOTE — Therapy (Signed)
[facility] [place], [place], [address] Phone: [phone]   Fax:  [phone]  Physical Therapy Treatment  Patient Details  Name: Jenna Ramos MRN: [id number] Date of Birth: Nov 10, 1928 Referring Provider:  Wardell Honour, MD  Encounter Date: 12/14/2014      PT End of Session - 12/14/14 1239    Visit Number 6   Number of Visits 12   Date for PT Re-Evaluation 01/05/15   PT Start Time 1230   PT Stop Time 1313   PT Time Calculation (min) 43 min   Activity Tolerance Patient tolerated treatment well   Behavior During Therapy [facility] for tasks assessed/performed      Past Medical History  Diagnosis Date  . Rectal incontinence   . Arthritis   . Fibromyalgia   . Glaucoma     uses Eye Drops daily  . Macular degeneration     wet and gets injections in both eyes  . HOH (hard of hearing)   . Stroke ([place])   . Hyperlipidemia     takes Atorvastatin daily  . Hypertension     takes Benazepril daily  . Anemia     takes Iron pill daily  . Peripheral vascular disease ([place])   . Nocturia   . Carotid artery occlusion   . Fibromyalgia   . Hypothyroidism     takes Synthroid daily    Past Surgical History  Procedure Laterality Date  . Tubal ligation    . Appendectomy    . Tonsillectomy    . Cataract surgery Bilateral   . Colonoscopy    . Esophagogastroduodenoscopy    . Wrist surgery      pins and screws  . Total hip arthroplasty Left 10/2013  . Endarterectomy Left 02/19/2014    Procedure: ENDARTERECTOMY CAROTID;  Surgeon: Conrad [place], MD;  Location: [place];  Service: Vascular;  Laterality: Left;  . Carotid endarterectomy      There were no vitals filed for this visit.  Visit Diagnosis:  Left wrist pain  Numbness and tingling in left hand      Subjective Assessment - 12/14/14 1235    Subjective no compliants after last treatment   Patient Stated Goals Grasp objects with left hand without pain.   Currently in Pain? Yes   Pain Score 2    Pain Location Wrist   Pain Orientation Left   Pain Descriptors / Indicators Aching   Pain Type Surgical pain   Pain Onset More than a month ago   Pain Frequency Intermittent   Aggravating Factors  pressure on hand   Pain Relieving Factors rest                         [facility] Adult PT Treatment/Exercise - 12/14/14 0001    Elbow Exercises   Forearm Supination Strengthening;Left  with hammer 3x10   Wrist Flexion Strengthening;Left  1# 3x10   Wrist Extension Strengthening;Left  1# 3x10   Wrist Exercises   Wrist Radial Deviation Strengthening;Left  1# 3x10   Electrical Stimulation   Electrical Stimulation Location Left carpal tunnel region   Electrical Stimulation Action premod   Electrical Stimulation Parameters 1-10hz    Electrical Stimulation Goals Pain   Ultrasound   Ultrasound Location left carpal   Ultrasound Parameters 1.2w/cm2/50%/96mhz x21min   Ultrasound Goals Pain                     PT Long  Term Goals - 12/14/14 1240    PT LONG TERM GOAL #1   Title Ind with HEP.   Time 6   Period Weeks   Status Achieved   PT LONG TERM GOAL #2   Title Grasp 8# weight with left hand and carry 100 feet with pain not > 2/10.   Time 6   Period Weeks   Status On-going  2-3 # at home per patient (12/14/14)   PT LONG TERM GOAL #3   Title No c/o left hand numbness.   Time 6   Period Weeks   Status On-going  75%-80% better (12/14/14)               Plan - 12/14/14 1242    Clinical Impression Statement Patient progressing with all activities and has 75%-80% improved with numbness in left hand. Patient is only able to carry 2-3 # at home comfortably at this time. Patient progressing toward goals yet ongoing due to pain and weight able to carry limitations.   Pt will benefit from skilled therapeutic intervention in order to improve on the following deficits Pain;Decreased activity tolerance   Rehab Potential  Good   PT Frequency 2x / week   PT Duration 6 weeks   PT Treatment/Interventions Moist Heat;Ultrasound;Therapeutic exercise;Patient/family education;Manual techniques;Passive range of motion   PT Next Visit Plan cont with POC   Consulted and Agree with Plan of Care Patient        Problem List Patient Active Problem List   Diagnosis Date Noted  . Hypothyroidism   . Hyperlipidemia   . Essential hypertension 05/04/2014  . Hip pain 03/31/2014  . TIA (transient ischemic attack) 01/23/2014  . Carotid stenosis 01/14/2014  . 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 Fibromyalgia muscle pain 10/28/2013  . Acute blood loss anemia 10/28/2013  . Hip fracture requiring operative repair ([place]) 10/17/2013  . Anemia     Jood Retana P, PTA 12/14/2014, 1:15 PM  [facility] [address] [place], [place], [address] Phone: [phone]   Fax:  [phone]

## 2014-12-16 ENCOUNTER — Ambulatory Visit (INDEPENDENT_AMBULATORY_CARE_PROVIDER_SITE_OTHER): Payer: Federal, State, Local not specified - PPO

## 2014-12-16 ENCOUNTER — Encounter: Payer: Self-pay | Admitting: Physical Therapy

## 2014-12-16 ENCOUNTER — Ambulatory Visit: Payer: Federal, State, Local not specified - PPO | Admitting: Physical Therapy

## 2014-12-16 DIAGNOSIS — Z23 Encounter for immunization: Secondary | ICD-10-CM | POA: Diagnosis not present

## 2014-12-16 DIAGNOSIS — R200 Anesthesia of skin: Secondary | ICD-10-CM

## 2014-12-16 DIAGNOSIS — R202 Paresthesia of skin: Principal | ICD-10-CM

## 2014-12-16 DIAGNOSIS — M25532 Pain in left wrist: Secondary | ICD-10-CM

## 2014-12-16 NOTE — Therapy (Signed)
Jenna Ramos [place], [place], [address] Phone: [phone]   Fax:  [phone]  Physical Therapy Treatment  Patient Details  Name: Jenna Ramos MRN: [id number] Date of Birth: September 27, 1928 Referring Provider:  Wardell Honour, MD  Encounter Date: 12/16/2014      PT End of Session - 12/16/14 1341    Visit Number 7   Number of Visits 12   Date for PT Re-Evaluation 01/05/15   PT Start Time 1314   PT Stop Time 1356   PT Time Calculation (min) 42 min   Activity Tolerance Patient tolerated treatment well   Behavior During Therapy Jenna Ramos for tasks assessed/performed      Past Medical History  Diagnosis Date  . Rectal incontinence   . Arthritis   . Fibromyalgia   . Glaucoma     uses Eye Drops daily  . Macular degeneration     wet and gets injections in both eyes  . HOH (hard of hearing)   . Stroke ([place])   . Hyperlipidemia     takes Atorvastatin daily  . Hypertension     takes Benazepril daily  . Anemia     takes Iron pill daily  . Peripheral vascular disease ([place])   . Nocturia   . Carotid artery occlusion   . Fibromyalgia   . Hypothyroidism     takes Synthroid daily    Past Jenna History  Procedure Laterality Date  . Tubal ligation    . Appendectomy    . Tonsillectomy    . Cataract surgery Bilateral   . Colonoscopy    . Esophagogastroduodenoscopy    . Wrist surgery      pins and screws  . Total hip arthroplasty Left 10/2013  . Endarterectomy Left 02/19/2014    Procedure: ENDARTERECTOMY CAROTID;  Surgeon: Jenna Follett, MD;  Location: [facility];  Service: Vascular;  Laterality: Left;  . Carotid endarterectomy      There were no vitals filed for this visit.  Visit Diagnosis:  Numbness and tingling in left hand  Left wrist pain      Subjective Assessment - 12/16/14 1328    Subjective no compliants after last treatment, some soreness after using arm with ADL's   Patient Stated Goals Grasp  objects with left hand without pain.   Currently in Pain? Yes   Pain Score 2    Pain Location Wrist   Pain Orientation Left   Pain Descriptors / Indicators Aching   Pain Type Jenna pain   Pain Onset More than a month ago   Pain Frequency Intermittent   Aggravating Factors  use or pressure on hand   Pain Relieving Factors rest                         [facility] Adult PT Treatment/Exercise - 12/16/14 0001    Elbow Exercises   Forearm Supination Strengthening;Left  3x10 with hammer   Wrist Flexion Strengthening;Left  3x10   Wrist Extension Strengthening;Left  3x10   Wrist Exercises   Wrist Radial Deviation Strengthening;Left  3x10   Other wrist exercises digi flex x30   Electrical Stimulation   Electrical Stimulation Location Left carpal tunnel region   Electrical Stimulation Action premod   Electrical Stimulation Parameters 1-10hz    Electrical Stimulation Goals Pain   Ultrasound   Ultrasound Location left carpal   Ultrasound Parameters 1.2w/cm/50%/3.73mhz x71min  PT Long Term Goals - 12/14/14 1240    PT LONG TERM GOAL #1   Title Ind with HEP.   Time 6   Period Weeks   Status Achieved   PT LONG TERM GOAL #2   Title Grasp 8# weight with left hand and carry 100 feet with pain not > 2/10.   Time 6   Period Weeks   Status On-going  2-3 # at home per patient (12/14/14)   PT LONG TERM GOAL #3   Title No c/o left hand numbness.   Time 6   Period Weeks   Status On-going  75%-80% better (12/14/14)               Plan - 12/16/14 1341    Clinical Impression Statement patient progressing with all activities and has continued to report less numbness in left hand about 20% remaining per patient. Patient has little pain overall and some increased soreness with ADL's. goals ongoing due to pain and limitations in left wrist.   Pt will benefit from skilled therapeutic intervention in order to improve on the following deficits  Pain;Decreased activity tolerance   Rehab Potential Good   PT Frequency 2x / week   PT Duration 6 weeks   PT Treatment/Interventions Moist Heat;Ultrasound;Therapeutic exercise;Patient/family education;Manual techniques;Passive range of motion   PT Next Visit Plan cont with POC   Consulted and Agree with Plan of Care Patient        Problem List Patient Active Problem List   Diagnosis Date Noted  . Hypothyroidism   . Hyperlipidemia   . Essential hypertension 05/04/2014  . Hip pain 03/31/2014  . TIA (transient ischemic attack) 01/23/2014  . Carotid stenosis 01/14/2014  . 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 Fibromyalgia muscle pain 10/28/2013  . Acute blood loss anemia 10/28/2013  . Hip fracture requiring operative repair ([place]) 10/17/2013  . Anemia     Jenna Ramos P, PTA 12/16/2014, 1:56 PM  Jenna Ramos [address] [place], [place], [address] Phone: [phone]   Fax:  [phone]

## 2014-12-21 ENCOUNTER — Encounter: Payer: Self-pay | Admitting: Physical Therapy

## 2014-12-21 ENCOUNTER — Ambulatory Visit: Payer: Federal, State, Local not specified - PPO | Admitting: Physical Therapy

## 2014-12-21 ENCOUNTER — Ambulatory Visit (INDEPENDENT_AMBULATORY_CARE_PROVIDER_SITE_OTHER): Payer: Federal, State, Local not specified - PPO | Admitting: *Deleted

## 2014-12-21 VITALS — BP 138/68 | HR 65

## 2014-12-21 DIAGNOSIS — I10 Essential (primary) hypertension: Secondary | ICD-10-CM

## 2014-12-21 DIAGNOSIS — R202 Paresthesia of skin: Principal | ICD-10-CM

## 2014-12-21 DIAGNOSIS — R200 Anesthesia of skin: Secondary | ICD-10-CM

## 2014-12-21 DIAGNOSIS — M25532 Pain in left wrist: Secondary | ICD-10-CM

## 2014-12-21 NOTE — Therapy (Signed)
[facility] [place], [place], [address] Phone: [phone]   Fax:  [phone]  Physical Therapy Treatment  Patient Details  Name: Jenna Ramos MRN: [id number] Date of Birth: 29-Mar-1928 No Data Recorded  Encounter Date: 12/21/2014      PT End of Session - 12/21/14 1329    Visit Number 8   Number of Visits 12   Date for PT Re-Evaluation 01/05/15   PT Start Time 3500   PT Stop Time 1359   PT Time Calculation (min) 42 min   Activity Tolerance Patient tolerated treatment well   Behavior During Therapy [facility] for tasks assessed/performed      Past Medical History  Diagnosis Date  . Rectal incontinence   . Arthritis   . Fibromyalgia   . Glaucoma     uses Eye Drops daily  . Macular degeneration     wet and gets injections in both eyes  . HOH (hard of hearing)   . Stroke ([place])   . Hyperlipidemia     takes Atorvastatin daily  . Hypertension     takes Benazepril daily  . Anemia     takes Iron pill daily  . Peripheral vascular disease ([place])   . Nocturia   . Carotid artery occlusion   . Fibromyalgia   . Hypothyroidism     takes Synthroid daily    Past Surgical History  Procedure Laterality Date  . Tubal ligation    . Appendectomy    . Tonsillectomy    . Cataract surgery Bilateral   . Colonoscopy    . Esophagogastroduodenoscopy    . Wrist surgery      pins and screws  . Total hip arthroplasty Left 10/2013  . Endarterectomy Left 02/19/2014    Procedure: ENDARTERECTOMY CAROTID;  Surgeon: Conrad Defiance, MD;  Location: [facility];  Service: Vascular;  Laterality: Left;  . Carotid endarterectomy      There were no vitals filed for this visit.  Visit Diagnosis:  Numbness and tingling in left hand  Left wrist pain      Subjective Assessment - 12/21/14 1321    Subjective no compliants after last treatment, some soreness after using arm with ADL's   Patient Stated Goals Grasp objects with left hand without  pain.   Currently in Pain? Yes   Pain Score 2    Pain Location Wrist   Pain Orientation Left   Pain Descriptors / Indicators Aching   Pain Type Surgical pain   Pain Onset More than a month ago   Pain Frequency Intermittent   Aggravating Factors  certain movement of wrist   Pain Relieving Factors rest                         [facility] Adult PT Treatment/Exercise - 12/21/14 0001    Elbow Exercises   Forearm Supination Strengthening;Left  with hammer 3x10   Wrist Flexion Strengthening;Left  2# 3x10   Wrist Extension Strengthening;Left  2# 3x10   Wrist Exercises   Wrist Radial Deviation Strengthening;Left  2# 3x10   Other wrist exercises digi flex x30   Electrical Stimulation   Electrical Stimulation Location Left carpal tunnel region   Electrical Stimulation Action premod   Electrical Stimulation Parameters 1-10hz    Electrical Stimulation Goals Pain   Ultrasound   Ultrasound Location left carpal   Ultrasound Parameters 1.2w/cm2/50%/3.3hmhz x21min   Ultrasound Goals Pain  PT Long Term Goals - 12/14/14 1240    PT LONG TERM GOAL #1   Title Ind with HEP.   Time 6   Period Weeks   Status Achieved   PT LONG TERM GOAL #2   Title Grasp 8# weight with left hand and carry 100 feet with pain not > 2/10.   Time 6   Period Weeks   Status On-going  2-3 # at home per patient (12/14/14)   PT LONG TERM GOAL #3   Title No c/o left hand numbness.   Time 6   Period Weeks   Status On-going  75%-80% better (12/14/14)               Plan - 12/21/14 1330    Clinical Impression Statement patient progressing with all activities. less pain reported overall and continued less numbness in left hand. patient able to increase weight with no complaints of pain or soreness in wrist. goals ongoing due to pain and symptoms in wrist   Pt will benefit from skilled therapeutic intervention in order to improve on the following deficits  Pain;Decreased activity tolerance   Rehab Potential Good   PT Frequency 2x / week   PT Duration 6 weeks   PT Treatment/Interventions Moist Heat;Ultrasound;Therapeutic exercise;Patient/family education;Manual techniques;Passive range of motion   PT Next Visit Plan cont with POC   Consulted and Agree with Plan of Care Patient        Problem List Patient Active Problem List   Diagnosis Date Noted  . Hypothyroidism   . Hyperlipidemia   . Essential hypertension 05/04/2014  . Hip pain 03/31/2014  . TIA (transient ischemic attack) 01/23/2014  . Carotid stenosis 01/14/2014  . 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 Fibromyalgia muscle pain 10/28/2013  . Acute blood loss anemia 10/28/2013  . Hip fracture requiring operative repair ([place]) 10/17/2013  . Anemia     Kenadie Royce P, PTA 12/21/2014, 2:08 PM  [facility] [place], [place], [address] Phone: [phone]   Fax:  [phone]  Name: Jenna Ramos MRN: [id number] Date of Birth: 1928/10/12

## 2014-12-23 ENCOUNTER — Encounter: Payer: Self-pay | Admitting: Physical Therapy

## 2014-12-23 ENCOUNTER — Ambulatory Visit: Payer: Federal, State, Local not specified - PPO | Admitting: Physical Therapy

## 2014-12-23 DIAGNOSIS — M25532 Pain in left wrist: Secondary | ICD-10-CM

## 2014-12-23 DIAGNOSIS — R202 Paresthesia of skin: Principal | ICD-10-CM

## 2014-12-23 DIAGNOSIS — R200 Anesthesia of skin: Secondary | ICD-10-CM

## 2014-12-23 NOTE — Therapy (Signed)
[facility] [place], [place], [address] Phone: [phone]   Fax:  [phone]  Physical Therapy Treatment  Patient Details  Name: Jenna Ramos MRN: [id number] Date of Birth: 06-23-1928 No Data Recorded  Encounter Date: 12/23/2014      PT End of Session - 12/23/14 1325    Visit Number 9   Number of Visits 12   Date for PT Re-Evaluation 01/05/15   PT Start Time 1314   PT Stop Time 1356   PT Time Calculation (min) 42 min   Activity Tolerance Patient tolerated treatment well   Behavior During Therapy [facility] for tasks assessed/performed      Past Medical History  Diagnosis Date  . Rectal incontinence   . Arthritis   . Fibromyalgia   . Glaucoma     uses Eye Drops daily  . Macular degeneration     wet and gets injections in both eyes  . HOH (hard of hearing)   . Stroke ([place])   . Hyperlipidemia     takes Atorvastatin daily  . Hypertension     takes Benazepril daily  . Anemia     takes Iron pill daily  . Peripheral vascular disease ([place])   . Nocturia   . Carotid artery occlusion   . Fibromyalgia   . Hypothyroidism     takes Synthroid daily    Past Surgical History  Procedure Laterality Date  . Tubal ligation    . Appendectomy    . Tonsillectomy    . Cataract surgery Bilateral   . Colonoscopy    . Esophagogastroduodenoscopy    . Wrist surgery      pins and screws  . Total hip arthroplasty Left 10/2013  . Endarterectomy Left 02/19/2014    Procedure: ENDARTERECTOMY CAROTID;  Surgeon: Conrad [place], MD;  Location: [facility];  Service: Vascular;  Laterality: Left;  . Carotid endarterectomy      There were no vitals filed for this visit.  Visit Diagnosis:  Numbness and tingling in left hand  Left wrist pain      Subjective Assessment - 12/23/14 1315    Subjective no compliants after last treatment, some tingling in wrist after using wrist   Patient Stated Goals Grasp objects with left hand without  pain.   Currently in Pain? Yes   Pain Score 2    Pain Location Wrist   Pain Orientation Left   Pain Descriptors / Indicators Aching   Pain Type Surgical pain   Pain Onset More than a month ago   Pain Frequency Intermittent   Aggravating Factors  certain movements or prolong use of wrist   Pain Relieving Factors rest                         [facility] Adult PT Treatment/Exercise - 12/23/14 0001    Elbow Exercises   Forearm Supination Strengthening;Left  with hammer 3x10   Wrist Flexion Strengthening;Left  2# 3x10   Wrist Extension Strengthening;Left  2# 3x10   Wrist Exercises   Wrist Radial Deviation Strengthening;Left  2# 3x10   Other wrist exercises D1/D2 with 2# 2x10   Other wrist exercises digi flex x30   Electrical Stimulation   Electrical Stimulation Location Left carpal tunnel region   Electrical Stimulation Action premod   Electrical Stimulation Parameters 1-10hz    Electrical Stimulation Goals Pain   Ultrasound   Ultrasound Location left carpal   Ultrasound Parameters 1.2w/cm2/50%/3.55mhz xmin  Ultrasound Goals Pain                     PT Long Term Goals - 12/14/14 1240    PT LONG TERM GOAL #1   Title Ind with HEP.   Time 6   Period Weeks   Status Achieved   PT LONG TERM GOAL #2   Title Grasp 8# weight with left hand and carry 100 feet with pain not > 2/10.   Time 6   Period Weeks   Status On-going  2-3 # at home per patient (12/14/14)   PT LONG TERM GOAL #3   Title No c/o left hand numbness.   Time 6   Period Weeks   Status On-going  75%-80% better (12/14/14)               Plan - 12/23/14 1322    Clinical Impression Statement Patient progressing with all activities. Patient has c/o tingling and low pain with prolong use or certain movements. Overall improvement per patient. Patient progressing with strengthening this week, yet goals ongoing due to wrist pain and symptoms. 50% improvement overall per patient.   Pt  will benefit from skilled therapeutic intervention in order to improve on the following deficits Pain;Decreased activity tolerance   Rehab Potential Good   PT Frequency 2x / week   PT Duration 6 weeks   PT Treatment/Interventions Moist Heat;Ultrasound;Therapeutic exercise;Patient/family education;Manual techniques;Passive range of motion   PT Next Visit Plan cont with POC for weight progression and UBE   Consulted and Agree with Plan of Care Patient        Problem List Patient Active Problem List   Diagnosis Date Noted  . Hypothyroidism   . Hyperlipidemia   . Essential hypertension 05/04/2014  . Hip pain 03/31/2014  . TIA (transient ischemic attack) 01/23/2014  . Carotid stenosis 01/14/2014  . 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 Fibromyalgia muscle pain 10/28/2013  . Acute blood loss anemia 10/28/2013  . Hip fracture requiring operative repair ([place]) 10/17/2013  . Anemia     Dreydon Cardenas P, PTA 12/23/2014, 1:56 PM  [facility] [place], [place], [address] Phone: [phone]   Fax:  [phone]  Name: Jenna Ramos MRN: [id number] Date of Birth: Jun 20, 1928

## 2014-12-28 ENCOUNTER — Encounter: Payer: Self-pay | Admitting: Physical Therapy

## 2014-12-28 ENCOUNTER — Ambulatory Visit: Payer: Federal, State, Local not specified - PPO | Admitting: Physical Therapy

## 2014-12-28 DIAGNOSIS — R200 Anesthesia of skin: Secondary | ICD-10-CM

## 2014-12-28 DIAGNOSIS — M25532 Pain in left wrist: Secondary | ICD-10-CM

## 2014-12-28 DIAGNOSIS — R202 Paresthesia of skin: Principal | ICD-10-CM

## 2014-12-28 NOTE — Therapy (Signed)
[name], [place], [address] Phone: [phone]   Fax:  [phone]  Physical Therapy Treatment  Patient Details  Name: Jenna Ramos MRN: [id number] Date of Birth: 07/22/1928 Referring Provider: Dr. Francesco Runner  Encounter Date: 12/28/2014      PT End of Session - 12/28/14 1558    Visit Number 10   Number of Visits 12   Date for PT Re-Evaluation 01/05/15   PT Start Time 1559   PT Stop Time 1638   PT Time Calculation (min) 39 min   Activity Tolerance Patient tolerated treatment well   Behavior During Therapy [facility] for tasks assessed/performed      Past Medical History  Diagnosis Date  . Rectal incontinence   . Arthritis   . Fibromyalgia   . Glaucoma     uses Eye Drops daily  . Macular degeneration     wet and gets injections in both eyes  . HOH (hard of hearing)   . Stroke ([place])   . Hyperlipidemia     takes Atorvastatin daily  . Hypertension     takes Benazepril daily  . Anemia     takes Iron pill daily  . Peripheral vascular disease ([place])   . Nocturia   . Carotid artery occlusion   . Fibromyalgia   . Hypothyroidism     takes Synthroid daily    Past Surgical History  Procedure Laterality Date  . Tubal ligation    . Appendectomy    . Tonsillectomy    . Cataract surgery Bilateral   . Colonoscopy    . Esophagogastroduodenoscopy    . Wrist surgery      pins and screws  . Total hip arthroplasty Left 10/2013  . Endarterectomy Left 02/19/2014    Procedure: ENDARTERECTOMY CAROTID;  Surgeon: Conrad Sherwood, MD;  Location: [facility];  Service: Vascular;  Laterality: Left;  . Carotid endarterectomy      There were no vitals filed for this visit.  Visit Diagnosis:  Numbness and tingling in left hand  Left wrist pain      Subjective Assessment - 12/28/14 1557    Subjective Reports she has been doing yardwork and used brace and wrist feels good. Notes that yesterday she didn't do anything and  wrist hurt. Reports that she still has trouble with carrying plates from kitchen to livingroom and has intermittant tinging sensations.   Patient Stated Goals Grasp objects with left hand without pain.   Currently in Pain? Yes   Pain Score 1    Pain Location Wrist   Pain Orientation Left   Pain Type Surgical pain   Pain Onset More than a month ago            [facility] PT Assessment - 12/28/14 0001    Assessment   Medical Diagnosis Cervical DDD; Carpal tunnel of left wrist.   Referring Provider Dr. Francesco Runner   Next MD Visit 12/30/2014   Precautions   Precautions None                     [facility] Adult PT Treatment/Exercise - 12/28/14 0001    Elbow Exercises   Forearm Supination Strengthening;Left  x30 reps with hammer   Wrist Flexion Strengthening;Left  x30 reps 2#   Wrist Extension Strengthening;Left  x30 reps 2#   Wrist Exercises   Wrist Radial Deviation Strengthening;Left  x30 reps   Wrist Ulnar Deviation Strengthening;Left  x20 reps 2#   Other wrist exercises  Red web grip x1.5 min, L wrist finger opposition x91min   Other wrist exercises R red digiflex x30 reps   Modalities   Modalities Electrical Stimulation;Ultrasound   Electrical Stimulation   Electrical Stimulation Location Left carpal tunnel region   Electrical Stimulation Action Pre-Mod   Electrical Stimulation Parameters 80-150 hz x15 min   Electrical Stimulation Goals Pain   Ultrasound   Ultrasound Location L Carpel region   Ultrasound Parameters 1.2 w/cm2, 50%, 3.3 mhz x10 min   Ultrasound Goals Pain                     PT Long Term Goals - 12/14/14 1240    PT LONG TERM GOAL #1   Title Ind with HEP.   Time 6   Period Weeks   Status Achieved   PT LONG TERM GOAL #2   Title Grasp 8# weight with left hand and carry 100 feet with pain not > 2/10.   Time 6   Period Weeks   Status On-going  2-3 # at home per patient (12/14/14)   PT LONG TERM GOAL #3   Title No c/o left hand  numbness.   Time 6   Period Weeks   Status On-going  75%-80% better (12/14/14)               Plan - 12/28/14 1626    Clinical Impression Statement Patient tolerated today's treatment well although she reported fatigue with web grip exercise and pain with L wrist ulnar deviation exercise. Completed all strengthening exercises well although she had the pain with the L wrist ulnar deviation. Normal modalities response noted following removal of the modalities. Had no complaints following today's treatment other than the pull in dorsal L third phalange.   Pt will benefit from skilled therapeutic intervention in order to improve on the following deficits Pain;Decreased activity tolerance   Rehab Potential Good   PT Frequency 2x / week   PT Duration 6 weeks   PT Treatment/Interventions Moist Heat;Ultrasound;Therapeutic exercise;Patient/family education;Manual techniques;Passive range of motion   PT Next Visit Plan cont with POC for weight progression and UBE. MD note needed next visit 12/30/2014.   Consulted and Agree with Plan of Care Patient        Problem List Patient Active Problem List   Diagnosis Date Noted  . Hypothyroidism   . Hyperlipidemia   . Essential hypertension 05/04/2014  . Hip pain 03/31/2014  . TIA (transient ischemic attack) 01/23/2014  . Carotid stenosis 01/14/2014  . 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 Fibromyalgia muscle pain 10/28/2013  . Acute blood loss anemia 10/28/2013  . Hip fracture requiring operative repair ([place]) 10/17/2013  . Anemia     Ahmed Prima, PTA 12/28/2014 4:52 PM  [facility] [place], [place], [address] Phone: [phone]   Fax:  [phone]  Name: Jenna Ramos MRN: [id number] Date of Birth: 1928-05-31

## 2014-12-28 NOTE — Therapy (Signed)
[facility] [place], [place], [address] Phone: [phone]   Fax:  [phone]  Physical Therapy Treatment  Patient Details  Name: Jenna Ramos MRN: [id number] Date of Birth: 1928/03/22 Referring Provider: Dr. Francesco Runner  Encounter Date: 12/28/2014      PT End of Session - 12/28/14 1558    Visit Number 10   Number of Visits 12   Date for PT Re-Evaluation 01/05/15   PT Start Time 1559   PT Stop Time 1638   PT Time Calculation (min) 39 min   Activity Tolerance Patient tolerated treatment well   Behavior During Therapy [facility] for tasks assessed/performed      Past Medical History  Diagnosis Date  . Rectal incontinence   . Arthritis   . Fibromyalgia   . Glaucoma     uses Eye Drops daily  . Macular degeneration     wet and gets injections in both eyes  . HOH (hard of hearing)   . Stroke ([place])   . Hyperlipidemia     takes Atorvastatin daily  . Hypertension     takes Benazepril daily  . Anemia     takes Iron pill daily  . Peripheral vascular disease ([place])   . Nocturia   . Carotid artery occlusion   . Fibromyalgia   . Hypothyroidism     takes Synthroid daily    Past Surgical History  Procedure Laterality Date  . Tubal ligation    . Appendectomy    . Tonsillectomy    . Cataract surgery Bilateral   . Colonoscopy    . Esophagogastroduodenoscopy    . Wrist surgery      pins and screws  . Total hip arthroplasty Left 10/2013  . Endarterectomy Left 02/19/2014    Procedure: ENDARTERECTOMY CAROTID;  Surgeon: Conrad Hobe Sound, MD;  Location: [facility];  Service: Vascular;  Laterality: Left;  . Carotid endarterectomy      There were no vitals filed for this visit.  Visit Diagnosis:  Numbness and tingling in left hand  Left wrist pain      Subjective Assessment - 12/28/14 1557    Subjective Reports she has been doing yardwork and used brace and wrist feels good. Notes that yesterday she didn't do anything and  wrist hurt. Reports that she still has trouble with carrying plates from kitchen to livingroom and has intermittant tinging sensations.   Patient Stated Goals Grasp objects with left hand without pain.   Currently in Pain? Yes   Pain Score 1    Pain Location Wrist   Pain Orientation Left   Pain Type Surgical pain   Pain Onset More than a month ago            [facility] PT Assessment - 12/28/14 0001    Assessment   Medical Diagnosis Cervical DDD; Carpal tunnel of left wrist.   Referring Provider Dr. Francesco Runner   Next MD Visit 12/30/2014   Precautions   Precautions None                     [facility] Adult PT Treatment/Exercise - 12/28/14 0001    Elbow Exercises   Forearm Supination Strengthening;Left  x30 reps with hammer   Wrist Flexion Strengthening;Left  x30 reps 2#   Wrist Extension Strengthening;Left  x30 reps 2#   Wrist Exercises   Wrist Radial Deviation Strengthening;Left  x30 reps   Wrist Ulnar Deviation Strengthening;Left  x20 reps 2#   Other wrist exercises  Red web grip x1.5 min, L wrist finger opposition x10min   Other wrist exercises R red digiflex x30 reps   Modalities   Modalities Electrical Stimulation;Ultrasound   Electrical Stimulation   Electrical Stimulation Location Left carpal tunnel region   Electrical Stimulation Action Pre-Mod   Electrical Stimulation Parameters 80-150 hz x15 min   Electrical Stimulation Goals Pain   Ultrasound   Ultrasound Location L Carpel region   Ultrasound Parameters 1.2 w/cm2, 50%, 3.3 mhz x10 min   Ultrasound Goals Pain                     PT Long Term Goals - 12/14/14 1240    PT LONG TERM GOAL #1   Title Ind with HEP.   Time 6   Period Weeks   Status Achieved   PT LONG TERM GOAL #2   Title Grasp 8# weight with left hand and carry 100 feet with pain not > 2/10.   Time 6   Period Weeks   Status On-going  2-3 # at home per patient (12/14/14)   PT LONG TERM GOAL #3   Title No c/o left hand  numbness.   Time 6   Period Weeks   Status On-going  75%-80% better (12/14/14)               Plan - 12/28/14 1626    Clinical Impression Statement Patient tolerated today's treatment well although she reported fatigue with web grip exercise and pain with L wrist ulnar deviation exercise. Completed all strengthening exercises well although she had the pain with the L wrist ulnar deviation. Normal modalities response noted following removal of the modalities. Had no complaints following today's treatment other than the pull in dorsal L third phalange.   Pt will benefit from skilled therapeutic intervention in order to improve on the following deficits Pain;Decreased activity tolerance   Rehab Potential Good   PT Frequency 2x / week   PT Duration 6 weeks   PT Treatment/Interventions Moist Heat;Ultrasound;Therapeutic exercise;Patient/family education;Manual techniques;Passive range of motion   PT Next Visit Plan cont with POC for weight progression and UBE. MD note needed next visit 12/30/2014.   Consulted and Agree with Plan of Care Patient        Problem List Patient Active Problem List   Diagnosis Date Noted  . Hypothyroidism   . Hyperlipidemia   . Essential hypertension 05/04/2014  . Hip pain 03/31/2014  . TIA (transient ischemic attack) 01/23/2014  . Carotid stenosis 01/14/2014  . 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 Fibromyalgia muscle pain 10/28/2013  . Acute blood loss anemia 10/28/2013  . Hip fracture requiring operative repair ([place]) 10/17/2013  . Anemia     Dain Laseter, [place] MPT 12/28/2014, 5:51 PM  [facility] [address] [place], [place], [address] Phone: [phone]   Fax:  [phone]  Name: Jenna Ramos MRN: [id number] Date of Birth: 1928-07-20

## 2014-12-30 ENCOUNTER — Encounter: Payer: Self-pay | Admitting: Physical Therapy

## 2014-12-30 ENCOUNTER — Ambulatory Visit: Payer: Federal, State, Local not specified - PPO | Admitting: Physical Therapy

## 2014-12-30 DIAGNOSIS — M25532 Pain in left wrist: Secondary | ICD-10-CM | POA: Diagnosis not present

## 2014-12-30 DIAGNOSIS — R200 Anesthesia of skin: Secondary | ICD-10-CM

## 2014-12-30 DIAGNOSIS — R202 Paresthesia of skin: Principal | ICD-10-CM

## 2014-12-30 NOTE — Therapy (Addendum)
[facility] [address] [place], [place], [address] Phone: [phone]   Fax:  [phone]  Physical Therapy Treatment  Patient Details  Name: Jenna Ramos MRN: [id number] Date of Birth: October 14, 1928 Referring Provider: Dr. Laurian Brim  Encounter Date: 12/30/2014    Past Medical History:  Diagnosis Date  . Anemia    takes Iron pill daily  . Arthritis   . Carotid artery occlusion   . Fibromyalgia   . Fibromyalgia   . Glaucoma    uses Eye Drops daily  . HOH (hard of hearing)   . Hyperlipidemia    takes Atorvastatin daily  . Hypertension    takes Benazepril daily  . Hypothyroidism    takes Synthroid daily  . Macular degeneration    wet and gets injections in both eyes  . Nocturia   . Peripheral vascular disease ([facility])   . Rectal incontinence   . Stroke [employer])     Past Surgical History:  Procedure Laterality Date  . APPENDECTOMY    . CAROTID ENDARTERECTOMY    . cataract surgery Bilateral   . COLONOSCOPY    . ENDARTERECTOMY Left 02/19/2014   Procedure: ENDARTERECTOMY CAROTID;  Surgeon: Fransisco Hertz, MD;  Location: [facility] OR;  Service: Vascular;  Laterality: Left;  . ESOPHAGOGASTRODUODENOSCOPY    . TONSILLECTOMY    . TOTAL HIP ARTHROPLASTY Left 10/2013  . TUBAL LIGATION    . WRIST SURGERY     pins and screws    There were no vitals filed for this visit.  Visit Diagnosis:  Numbness and tingling in left hand  Left wrist pain                                    PT Long Term Goals - 12/30/14 1126      PT LONG TERM GOAL #1   Title Ind with HEP.   Time 6   Period Weeks   Status Achieved     PT LONG TERM GOAL #2   Title Grasp 8# weight with left hand and carry 100 feet with pain not > 2/10.   Time 6   Period Weeks   Status Achieved  2-3 # at home per patient (12/14/14)     PT LONG TERM GOAL #3   Title No c/o left hand numbness.   Time 6   Period Weeks   Status Achieved  75%-80%  better (12/14/14)               Problem List Patient Active Problem List   Diagnosis Date Noted  . Hypothyroidism   . Hyperlipidemia   . Essential hypertension 05/04/2014  . Hip pain 03/31/2014  . TIA (transient ischemic attack) 01/23/2014  . Carotid artery disease ([facility]) 01/14/2014  . 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 Fibromyalgia muscle pain 10/28/2013  . Acute blood loss anemia 10/28/2013  . Hip fracture requiring operative repair ([facility]) 10/17/2013  . Anemia     Florence Canner, PTA 12/20/15 6:42 PM  [place] MPT [facility] [address] [place], [place], [address] Phone: [phone]   Fax:  [phone]  Name: Jenna Ramos MRN: [id number] Date of Birth: 09/17/1928  PHYSICAL THERAPY DISCHARGE SUMMARY  Visits from Start of Care: 11.  Current functional level related to goals / functional outcomes: Please see above.   Remaining deficits: All goals met.  Education / Equipment: HEP. Plan: Patient agrees to discharge.  Patient goals were met. Patient is being discharged due to meeting the stated rehab goals.  ?????         [place] Applegate MPT

## 2014-12-31 ENCOUNTER — Encounter (INDEPENDENT_AMBULATORY_CARE_PROVIDER_SITE_OTHER): Payer: Federal, State, Local not specified - PPO | Admitting: Ophthalmology

## 2014-12-31 DIAGNOSIS — H35033 Hypertensive retinopathy, bilateral: Secondary | ICD-10-CM | POA: Diagnosis not present

## 2014-12-31 DIAGNOSIS — H43813 Vitreous degeneration, bilateral: Secondary | ICD-10-CM

## 2014-12-31 DIAGNOSIS — H353231 Exudative age-related macular degeneration, bilateral, with active choroidal neovascularization: Secondary | ICD-10-CM | POA: Diagnosis not present

## 2014-12-31 DIAGNOSIS — I10 Essential (primary) hypertension: Secondary | ICD-10-CM

## 2015-02-01 ENCOUNTER — Telehealth: Payer: Self-pay | Admitting: Family Medicine

## 2015-02-01 NOTE — Telephone Encounter (Signed)
Stp and she states she has oxycodone but doesn't like to take it and would like to know if there is anything else she can take. She knows you aren't in the office until tomorrow.

## 2015-02-02 NOTE — Telephone Encounter (Signed)
In between doses of oxycodone she can take either tramadol or Tylenol

## 2015-02-03 NOTE — Telephone Encounter (Signed)
Stp and advised of md feedback. Pt states neither of these work for her and she doesn't want to have to take oxycodone all the time. Offered to ask if there was anything else she can take and pt declined.

## 2015-02-04 ENCOUNTER — Encounter (INDEPENDENT_AMBULATORY_CARE_PROVIDER_SITE_OTHER): Payer: Federal, State, Local not specified - PPO | Admitting: Ophthalmology

## 2015-02-05 ENCOUNTER — Encounter: Payer: Self-pay | Admitting: Family

## 2015-02-05 ENCOUNTER — Ambulatory Visit (INDEPENDENT_AMBULATORY_CARE_PROVIDER_SITE_OTHER): Payer: Federal, State, Local not specified - PPO | Admitting: Family

## 2015-02-05 VITALS — BP 145/75 | HR 68 | Temp 97.6°F | Ht 63.0 in | Wt 130.0 lb

## 2015-02-05 DIAGNOSIS — J309 Allergic rhinitis, unspecified: Secondary | ICD-10-CM | POA: Diagnosis not present

## 2015-02-05 MED ORDER — CETIRIZINE HCL 10 MG PO TABS: 5.0000 mg | ORAL_TABLET | Freq: Every day | ORAL | Status: DC

## 2015-02-05 MED ORDER — MOMETASONE FUROATE 50 MCG/ACT NA SUSP
2.0000 | Freq: Every day | NASAL | Status: DC
Start: 2015-02-05 — End: 2015-05-11

## 2015-02-05 NOTE — Progress Notes (Signed)
   Subjective:    Patient ID: Jenna Ramos, female    DOB: October 23, 1928, 81 y.o.   MRN: [id number]  Otalgia  There is pain in the right ear. This is a new problem. The current episode started yesterday. The problem has been unchanged. There has been no fever. The pain is at a severity of 2/10. The pain is mild. Associated symptoms include hearing loss. Pertinent negatives include no coughing, diarrhea, ear discharge, headaches, neck pain, rhinorrhea, sore throat or vomiting. She has tried nothing for the symptoms. The treatment provided no relief.      Review of Systems  Constitutional: Negative.   HENT: Positive for ear pain and hearing loss. Negative for ear discharge, rhinorrhea and sore throat.   Eyes: Negative.   Respiratory: Negative.  Negative for cough and shortness of breath.   Cardiovascular: Negative.  Negative for palpitations.  Gastrointestinal: Negative.  Negative for vomiting and diarrhea.  Endocrine: Negative.   Genitourinary: Negative.   Musculoskeletal: Negative.  Negative for neck pain.  Neurological: Negative.  Negative for headaches.  Hematological: Negative.   Psychiatric/Behavioral: Negative.   All other systems reviewed and are negative.      Objective:   Physical Exam  Constitutional: She is oriented to person, place, and time. She appears well-developed and well-nourished. No distress.  HENT:  Head: Normocephalic and atraumatic.  Right Ear: A middle ear effusion is present.  Left Ear: A middle ear effusion is present.  Mouth/Throat: Oropharynx is clear and moist.  Nasal passage erythemas with moderate swelling    Eyes: Pupils are equal, round, and reactive to light.  Neck: Normal range of motion. Neck supple. No thyromegaly present.  Cardiovascular: Normal rate, regular rhythm, normal heart sounds and intact distal pulses.   No murmur heard. Pulmonary/Chest: Effort normal and breath sounds normal. No respiratory distress. She has no wheezes.    Abdominal: Soft. Bowel sounds are normal. She exhibits no distension. There is no tenderness.  Musculoskeletal: Normal range of motion. She exhibits no edema or tenderness.  Neurological: She is alert and oriented to person, place, and time. She has normal reflexes. No cranial nerve deficit.  Skin: Skin is warm and dry.  Psychiatric: She has a normal mood and affect. Her behavior is normal. Judgment and thought content normal.  Vitals reviewed.    BP 158/71 mmHg  Pulse 73  Temp(Src) 97.6 F (36.4 C) (Oral)  Ht 5\' 3"  (1.6 m)  Wt 130 lb (58.968 kg)  BMI 23.03 kg/m2      Assessment & Plan:  1. Allergic rhinitis, unspecified allergic rhinitis type -Avoid allergens when possible -take medications as prescribed -RTO prn  - mometasone (NASONEX) 50 MCG/ACT nasal spray; Place 2 sprays into the nose daily.  Dispense: 17 g; Refill: 12 - cetirizine (ZYRTEC) 10 MG tablet; Take 0.5 tablets (5 mg total) by mouth daily.  Dispense: 30 tablet; Refill: [place], FNP

## 2015-02-05 NOTE — Patient Instructions (Signed)
Allergic Rhinitis Allergic rhinitis is when the mucous membranes in the nose respond to allergens. Allergens are particles in the air that cause your body to have an allergic reaction. This causes you to release allergic antibodies. Through a chain of events, these eventually cause you to release histamine into the blood stream. Although meant to protect the body, it is this release of histamine that causes your discomfort, such as frequent sneezing, congestion, and an itchy, runny nose.  CAUSES Seasonal allergic rhinitis (hay fever) is caused by pollen allergens that may come from grasses, trees, and weeds. Year-round allergic rhinitis (perennial allergic rhinitis) is caused by allergens such as house dust mites, pet dander, and mold spores. SYMPTOMS  Nasal stuffiness (congestion).  Itchy, runny nose with sneezing and tearing of the eyes. DIAGNOSIS Your health care provider can help you determine the allergen or allergens that trigger your symptoms. If you and your health care provider are unable to determine the allergen, skin or blood testing may be used. Your health care provider will diagnose your condition after taking your health history and performing a physical exam. Your health care provider may assess you for other related conditions, such as asthma, pink eye, or an ear infection. TREATMENT Allergic rhinitis does not have a cure, but it can be controlled by:  Medicines that block allergy symptoms. These may include allergy shots, nasal sprays, and oral antihistamines.  Avoiding the allergen. Hay fever may often be treated with antihistamines in pill or nasal spray forms. Antihistamines block the effects of histamine. There are over-the-counter medicines that may help with nasal congestion and swelling around the eyes. Check with your health care provider before taking or giving this medicine. If avoiding the allergen or the medicine prescribed do not work, there are many new medicines  your health care provider can prescribe. Stronger medicine may be used if initial measures are ineffective. Desensitizing injections can be used if medicine and avoidance does not work. Desensitization is when a patient is given ongoing shots until the body becomes less sensitive to the allergen. Make sure you follow up with your health care provider if problems continue. HOME CARE INSTRUCTIONS It is not possible to completely avoid allergens, but you can reduce your symptoms by taking steps to limit your exposure to them. It helps to know exactly what you are allergic to so that you can avoid your specific triggers. SEEK MEDICAL CARE IF:  You have a fever.  You develop a cough that does not stop easily (persistent).  You have shortness of breath.  You start wheezing.  Symptoms interfere with normal daily activities.   This information is not intended to replace advice given to you by your health care provider. Make sure you discuss any questions you have with your health care provider.   Document Released: 11/15/2000 Document Revised: 03/13/2014 Document Reviewed: 10/28/2012 Elsevier Interactive Patient Education 2016 Elsevier Inc.  

## 2015-02-08 ENCOUNTER — Encounter (INDEPENDENT_AMBULATORY_CARE_PROVIDER_SITE_OTHER): Payer: Federal, State, Local not specified - PPO | Admitting: Ophthalmology

## 2015-02-08 DIAGNOSIS — H353231 Exudative age-related macular degeneration, bilateral, with active choroidal neovascularization: Secondary | ICD-10-CM

## 2015-02-08 DIAGNOSIS — I10 Essential (primary) hypertension: Secondary | ICD-10-CM | POA: Diagnosis not present

## 2015-02-08 DIAGNOSIS — H35033 Hypertensive retinopathy, bilateral: Secondary | ICD-10-CM

## 2015-02-08 DIAGNOSIS — H43813 Vitreous degeneration, bilateral: Secondary | ICD-10-CM | POA: Diagnosis not present

## 2015-02-24 ENCOUNTER — Other Ambulatory Visit: Payer: Self-pay | Admitting: Family Medicine

## 2015-03-08 ENCOUNTER — Encounter (INDEPENDENT_AMBULATORY_CARE_PROVIDER_SITE_OTHER): Payer: Federal, State, Local not specified - PPO | Admitting: Ophthalmology

## 2015-03-10 ENCOUNTER — Encounter (INDEPENDENT_AMBULATORY_CARE_PROVIDER_SITE_OTHER): Payer: Federal, State, Local not specified - PPO | Admitting: Ophthalmology

## 2015-03-10 DIAGNOSIS — H353231 Exudative age-related macular degeneration, bilateral, with active choroidal neovascularization: Secondary | ICD-10-CM

## 2015-03-10 DIAGNOSIS — I10 Essential (primary) hypertension: Secondary | ICD-10-CM

## 2015-03-10 DIAGNOSIS — H43813 Vitreous degeneration, bilateral: Secondary | ICD-10-CM

## 2015-03-10 DIAGNOSIS — H35033 Hypertensive retinopathy, bilateral: Secondary | ICD-10-CM

## 2015-03-17 ENCOUNTER — Other Ambulatory Visit: Payer: Self-pay | Admitting: Family

## 2015-03-17 NOTE — Telephone Encounter (Signed)
Last seen 02/05/15  Jenna Ramos  If approved print

## 2015-03-18 NOTE — Telephone Encounter (Signed)
RX ready for pick up 

## 2015-03-19 ENCOUNTER — Other Ambulatory Visit (HOSPITAL_COMMUNITY): Payer: Federal, State, Local not specified - PPO

## 2015-03-19 ENCOUNTER — Ambulatory Visit: Payer: Federal, State, Local not specified - PPO | Admitting: Vascular Surgery

## 2015-03-30 ENCOUNTER — Encounter: Payer: Self-pay | Admitting: Vascular Surgery

## 2015-04-09 ENCOUNTER — Ambulatory Visit (INDEPENDENT_AMBULATORY_CARE_PROVIDER_SITE_OTHER): Payer: Federal, State, Local not specified - PPO | Admitting: Vascular Surgery

## 2015-04-09 ENCOUNTER — Encounter: Payer: Self-pay | Admitting: Vascular Surgery

## 2015-04-09 ENCOUNTER — Ambulatory Visit (HOSPITAL_COMMUNITY)
Admission: RE | Admit: 2015-04-09 | Discharge: 2015-04-09 | Disposition: A | Discharge status: Home / Self Care | Payer: Federal, State, Local not specified - PPO | Source: Ambulatory Visit | Attending: Vascular Surgery | Admitting: Vascular Surgery

## 2015-04-09 VITALS — BP 153/69 | HR 69 | Temp 97.8°F | Resp 18 | Ht 63.0 in | Wt 130.0 lb

## 2015-04-09 DIAGNOSIS — I6523 Occlusion and stenosis of bilateral carotid arteries: Secondary | ICD-10-CM | POA: Diagnosis not present

## 2015-04-09 DIAGNOSIS — I779 Disorder of arteries and arterioles, unspecified: Secondary | ICD-10-CM | POA: Diagnosis not present

## 2015-04-09 DIAGNOSIS — E785 Hyperlipidemia, unspecified: Secondary | ICD-10-CM | POA: Insufficient documentation

## 2015-04-09 DIAGNOSIS — I739 Peripheral vascular disease, unspecified: Principal | ICD-10-CM

## 2015-04-09 DIAGNOSIS — I10 Essential (primary) hypertension: Secondary | ICD-10-CM | POA: Diagnosis not present

## 2015-04-09 NOTE — Addendum Note (Signed)
Addended by: Reola Calkins on: 04/09/2015 04:12 PM   Modules accepted: Orders

## 2015-04-09 NOTE — Progress Notes (Addendum)
Established Carotid Patient  History of Present Illness  Jenna Ramos is a 82 y.o. (1928/09/22) female who presents with chief complaint: routine surveillance.  Pt is s/p L CEA 02/19/14 for sx L ICA stenosis >70%.  The patient has been lost to follow-up recently.  Patient has history of TIA with recepetive aphasia.  The patient has never had amaurosis fugax or monocular blindness.  The patient has never had facial drooping or hemiplegia.  The patient has never had expressive aphasia.    The patient's PMH, PSH, SH, and FamHx are unchanged from 02/23/14.  Current Outpatient Prescriptions  Medication Sig Dispense Refill  . amLODipine (NORVASC) 5 MG tablet TAKE ONE TABLET BY MOUTH ONE TIME DAILY 30 tablet 4  . aspirin 325 MG EC tablet Take 325 mg by mouth.    Marland Kitchen atorvastatin (LIPITOR) 40 MG tablet TAKE 1 TABLET (40 MG TOTAL) BY MOUTH DAILY. 90 tablet 0  . benazepril (LOTENSIN) 20 MG tablet TAKE ONE TABLET BY MOUTH ONE TIME DAILY 90 tablet 1  . BESIVANCE 0.6 % SUSP Place 1 drop into both eyes See admin instructions. Take day of eye shots and the day after    . bimatoprost (LUMIGAN) 0.03 % ophthalmic solution Place 1 drop into both eyes at bedtime.    . Calcium Carbonate-Vitamin D (CALCIUM 600/VITAMIN D) 600-400 MG-UNIT per tablet Take 1 tablet by mouth 3 (three) times daily with meals.     Marland Kitchen FERREX 150 150 MG capsule TAKE 1 CAPSULE BY MOUTH TWICE A DAY BEFORE LUNCH AND SUPPER AS INSTRUCTED 60 capsule 5  . levothyroxine (SYNTHROID, LEVOTHROID) 88 MCG tablet Take 1 tablet (88 mcg total) by mouth daily. As directed 90 tablet 3  . LUMIGAN 0.01 % SOLN     . Multiple Vitamins-Minerals (MULTIVITAMINS THER. W/MINERALS) TABS Take 1 tablet by mouth daily.    . Omega-3 Fatty Acids (FISH OIL) 1200 MG CAPS Take 1 capsule by mouth 3 (three) times daily with meals.    Marland Kitchen oxyCODONE (OXY IR/ROXICODONE) 5 MG immediate release tablet TAKE 1 TABLET EVERY 6 HOURS AS NEEDED FOR SEVERE PAIN 15 tablet 0  .  cetirizine (ZYRTEC) 10 MG tablet Take 0.5 tablets (5 mg total) by mouth daily. (Patient not taking: Reported on 04/09/2015) 30 tablet 11  . mometasone (NASONEX) 50 MCG/ACT nasal spray Place 2 sprays into the nose daily. (Patient not taking: Reported on 04/09/2015) 17 g 12   No current facility-administered medications for this visit.    Allergies  Allergen Reactions  . Aggrenox [Aspirin-Dipyridamole Er] Anaphylaxis    Severe headache  . Codeine Other (See Comments)    Feet burn and agitation  . Morphine And Related Other (See Comments)    Feet burn and agitation    On ROS today: fall in recent year leading to L leg and wrist fx, residual pain from those fx   Physical Examination  Filed Vitals:   04/09/15 1206 04/09/15 1211 04/09/15 1212 04/09/15 1215  BP: 172/71 154/69 170/70 153/69  Pulse: 69     Temp: 97.8 F (36.6 C)     TempSrc: Oral     Resp: 18     Height: 5\' 3"  (1.6 m)     Weight: 130 lb (58.968 kg)     SpO2: 97%      Body mass index is 23.03 kg/(m^2).  General: A&O x 3, WD, elderly, thin  Eyes: PERRLA, EOMI  Neck: Supple,  nuchal rigidity,  palpable LAD, inc well healed  Pulmonary: Sym exp, good air movt, CTAB, no rales, rhonchi, & wheezing  Cardiac: RRR, Nl S1, S2, no Murmurs, rubs or gallops  Vascular: Vessel Right Left  Radial Palpable Palpable  Brachial Palpable Palpable  Carotid Palpable, without bruit Palpable, without bruit  Aorta Not palpable N/A  Femoral Palpable Palpable  Popliteal Not palpable Not palpable  PT Palpable Palpable  DP Palpable Palpable   Gastrointestinal: soft, NTND, no G/R, no HSM, no masses, no CVAT B  Musculoskeletal: M/S 5/5 throughout , Extremities without ischemic changes   Neurologic: CN 2-12 intact , Pain and light touch intact in extremities , Motor exam as listed above   Non-Invasive Vascular Imaging  CAROTID DUPLEX (Date: 04/09/2015 ):   R ICA stenosis: 1-39%  R VA: patent and antegrade  L ICA stenosis:  widely patent  L VA:  patent and antegrade   Medical Decision Making  Jenna Ramos is a 82 y.o. female who presents with: R asx R ICA stenosis <50%., s/p L CEA with evidence of restenosis   Based on the patient's vascular studies and examination, I have offered the patient: annual B carotid duplex.  If next year's studies remain impressively minimal, may switch to q2 year studies  I discussed in depth with the patient the nature of atherosclerosis, and emphasized the importance of maximal medical management including strict control of blood pressure, blood glucose, and lipid levels, antiplatelet agents, obtaining regular exercise, and cessation of smoking.    The patient is aware that without maximal medical management the underlying atherosclerotic disease process will progress, limiting the benefit of any interventions. The patient is currently on a statin: Lipitor. The patient is currently on an anti-platelet: ASA.  Thank you for allowing [place] to participate in this patient's care.   Adele Barthel, MD [facility] Office: [phone] Pager: [phone]  04/09/2015, 12:52 PM   VASCULAR QUALITY INITIATIVE FOLLOW UP DATA:  Current smoker: [  ] yes  [x  ] no  Living status: [x  ]  Home  [  ] Nursing home  [  ] Homeless    MEDS:  ASA [x  ] yes  [  ] no- [  ] medical reason  [  ] non compliant  STATIN  [  x] yes  [  ] no- [  ] medical reason  [  ] non compliant  Beta blocker [  ] yes  [ x ] no- [  ] medical reason  [  ] non compliant  ACE inhibitor [x  ] yes  [  ] no- [  ] medical reason  [  ] non compliant  P2Y12 Antagonist [x  ] none  [  ] clopidogrel-Plavix  [  ] ticlopidine-Ticlid   [  ] prasugrel-Effient  [  ] ticagrelor- Brilinta    Anticoagulant [ x ] None  [  ] warfarin  [  ] rivaroxaban-Xarelto [  ] dabigatran- Pradaxa  Neurologic event since D/C:  [x  ] no  [  ] yes: [  ] eye event  [  ] cortical event  [  ] VB event  [  ]  non specific event  [  ] right  [  ] left  [  ] TIA  [  ] stroke  Date:   Modified Rankin Score: 0  MI since D/C: [x  ] no  [  ] troponin only  [  ] EKG or clinical  Cranial  nerve injury: [ x ] none  [  ] resolved  [  ] persistent  Duplex CEA site: [  ] no  [x ] yes - PSV= 99  EDV= 16  ICA/CCA ratio: 1.0  Stenosis= [ x ] <40% [  ] 40-59% [  ] 60-79%  [  ] > 80%  [  ]  Occluded  CEA site re-operation:  [ x ] no   [  ] yes- date of re-op:  CEA site PCI:   [  x] no   [  ] yes- date of PCI:

## 2015-04-14 ENCOUNTER — Encounter (INDEPENDENT_AMBULATORY_CARE_PROVIDER_SITE_OTHER): Payer: Federal, State, Local not specified - PPO | Admitting: Ophthalmology

## 2015-04-14 DIAGNOSIS — I10 Essential (primary) hypertension: Secondary | ICD-10-CM

## 2015-04-14 DIAGNOSIS — H35033 Hypertensive retinopathy, bilateral: Secondary | ICD-10-CM

## 2015-04-14 DIAGNOSIS — H353231 Exudative age-related macular degeneration, bilateral, with active choroidal neovascularization: Secondary | ICD-10-CM

## 2015-04-14 DIAGNOSIS — H43813 Vitreous degeneration, bilateral: Secondary | ICD-10-CM | POA: Diagnosis not present

## 2015-04-18 ENCOUNTER — Other Ambulatory Visit: Payer: Self-pay | Admitting: Family Medicine

## 2015-04-26 ENCOUNTER — Other Ambulatory Visit: Payer: Self-pay | Admitting: Family Medicine

## 2015-04-26 ENCOUNTER — Other Ambulatory Visit: Payer: Self-pay | Admitting: Physician Assistant

## 2015-05-11 ENCOUNTER — Encounter: Payer: Self-pay | Admitting: Family Medicine

## 2015-05-11 ENCOUNTER — Ambulatory Visit (INDEPENDENT_AMBULATORY_CARE_PROVIDER_SITE_OTHER): Payer: Federal, State, Local not specified - PPO | Admitting: Family Medicine

## 2015-05-11 VITALS — BP 149/71 | HR 69 | Temp 96.6°F | Ht 63.0 in | Wt 129.4 lb

## 2015-05-11 DIAGNOSIS — I779 Disorder of arteries and arterioles, unspecified: Secondary | ICD-10-CM | POA: Diagnosis not present

## 2015-05-11 DIAGNOSIS — S72002D Fracture of unspecified part of neck of left femur, subsequent encounter for closed fracture with routine healing: Secondary | ICD-10-CM | POA: Diagnosis not present

## 2015-05-11 DIAGNOSIS — I739 Peripheral vascular disease, unspecified: Principal | ICD-10-CM

## 2015-05-11 NOTE — Progress Notes (Signed)
Patient ID: Jenna Ramos, female   DOB: October 15, 1928, 82 y.o.   MRN: [id number]  Primary Physician: Wardell Honour, MD  Chief Complaint: 82 year old female here to follow-up hypertension, carotid vascular disease, and back and hip pain. She continues to do well. She works in her yard and remains active. She only takes extra strength Tylenol now for discomfort. Lipids thyroid was last checked in July 2016. She has seen her vascular surgeon recently who gave her a good report.     Past Medical History  Diagnosis Date  . Rectal incontinence   . Arthritis   . Fibromyalgia   . Glaucoma     uses Eye Drops daily  . Macular degeneration     wet and gets injections in both eyes  . HOH (hard of hearing)   . Stroke ([place])   . Hyperlipidemia     takes Atorvastatin daily  . Hypertension     takes Benazepril daily  . Anemia     takes Iron pill daily  . Peripheral vascular disease ([place])   . Nocturia   . Carotid artery occlusion   . Fibromyalgia   . Hypothyroidism     takes Synthroid daily     Home Meds: Prior to Admission medications   Medication Sig Start Date End Date Taking? Authorizing Provider  amLODipine (NORVASC) 5 MG tablet TAKE ONE TABLET BY MOUTH ONE TIME DAILY 12/01/14  Yes Wardell Honour, MD  aspirin 325 MG EC tablet Take 325 mg by mouth.   Yes Historical Provider, MD  atorvastatin (LIPITOR) 40 MG tablet TAKE ONE TABLET BY MOUTH ONE TIME DAILY 04/27/15  Yes Wardell Honour, MD  benazepril (LOTENSIN) 20 MG tablet TAKE ONE TABLET BY MOUTH ONE TIME DAILY 04/27/15  Yes Wardell Honour, MD  BESIVANCE 0.6 % SUSP Place 1 drop into both eyes See admin instructions. Take day of eye shots and the day after 01/19/14  Yes Historical Provider, MD  bimatoprost (LUMIGAN) 0.03 % ophthalmic solution Place 1 drop into both eyes at bedtime.   Yes Historical Provider, MD  Calcium Carbonate-Vitamin D (CALCIUM 600/VITAMIN D) 600-400 MG-UNIT per tablet Take 1 tablet by mouth 3 (three) times  daily with meals.    Yes Historical Provider, MD  FERREX 150 150 MG capsule TAKE 1 CAPSULE BY MOUTH TWICE A DAY BEFORE LUNCH AND SUPPER AS INSTRUC TED 04/19/15  Yes Wardell Honour, MD  levothyroxine (SYNTHROID, LEVOTHROID) 88 MCG tablet Take 1 tablet (88 mcg total) by mouth daily. As directed 09/28/14  Yes Wardell Honour, MD  Multiple Vitamins-Minerals (MULTIVITAMINS THER. W/MINERALS) TABS Take 1 tablet by mouth daily.   Yes Historical Provider, MD  Omega-3 Fatty Acids (FISH OIL) 1200 MG CAPS Take 1 capsule by mouth 3 (three) times daily with meals.   Yes Historical Provider, MD  oxyCODONE (OXY IR/ROXICODONE) 5 MG immediate release tablet TAKE 1 TABLET EVERY 6 HOURS AS NEEDED FOR SEVERE PAIN 03/18/15  Yes Sharion Balloon, FNP    Allergies:  Allergies  Allergen Reactions  . Aggrenox [Aspirin-Dipyridamole Er] Anaphylaxis    Severe headache  . Codeine Other (See Comments)    Feet burn and agitation  . Morphine And Related Other (See Comments)    Feet burn and agitation    Social History   Social History  . Marital Status: Widowed    Spouse Name: N/A  . Number of Children: N/A  . Years of Education: N/A   Occupational History  . Not on  file.   Social History Main Topics  . Smoking status: Former Research scientist (life sciences)  . Smokeless tobacco: Never Used     Comment: quit smoking 79yrs ago  . Alcohol Use: No  . Drug Use: No  . Sexual Activity: Yes    Birth Control/ Protection: Post-menopausal   Other Topics Concern  . Not on file   Social History Narrative     Review of Systems: Constitutional: negative for chills, fever, night sweats, weight changes, or fatigue  HEENT: negative for vision changes, hearing loss, congestion, rhinorrhea, ST, epistaxis, or sinus pressure Cardiovascular: negative for chest pain or palpitations Respiratory: negative for hemoptysis, wheezing, shortness of breath, or cough Abdominal: negative for abdominal pain, nausea, vomiting, diarrhea, or  constipation Dermatological: negative for rash Neurologic: negative for headache, dizziness, or syncope All other systems reviewed and are otherwise negative with the exception to those above and in the HPI.   Physical Exam: There were no vitals taken for this visit., There is no weight on file to calculate BMI. General: Well developed, well nourished, in no acute distress. Head: Normocephalic, atraumatic, eyes without discharge, sclera non-icteric, nares are without discharge. Bilateral auditory canals clear, TM's are without perforation, pearly grey and translucent with reflective cone of light bilaterally. Oral cavity moist, posterior pharynx without exudate, erythema, peritonsillar abscess, or post nasal drip.  Neck: Supple. No thyromegaly. Full ROM. No lymphadenopathy. Lungs: Clear bilaterally to auscultation without wheezes, rales, or rhonchi. Breathing is unlabored. Heart: RRR with S1 S2. No murmurs, rubs, or gallops appreciated. Abdomen: Soft, non-tender, non-distended with normoactive bowel sounds. No hepatomegaly. No rebound/guarding. No obvious abdominal masses. Msk:  Strength and tone normal for age. Extremities/Skin: Warm and dry. No clubbing or cyanosis. No edema. No rashes or suspicious lesions. Neuro: Alert and oriented X 3. Moves all extremities spontaneously. Gait is normal. CNII-XII grossly in tact. Psych:  Responds to questions appropriately with a normal affect.   Labs:   ASSESSMENT AND PLAN:   1. Bilateral carotid artery disease (HCC) Carotid pulses are strong area and no bruits appreciated  2. Hip fracture requiring operative repair, left, closed, with routine healing, subsequent encounter Still has some stiffness and aching especially when she has been inactive for a while but continues to work in her yard and be active  Wardell Honour MD  05/11/2015 10:15 AM

## 2015-05-11 NOTE — Addendum Note (Signed)
Addended by: Jamelle Haring on: 05/11/2015 10:51 AM   Modules accepted: Miquel Dunn

## 2015-05-18 ENCOUNTER — Other Ambulatory Visit: Payer: Self-pay | Admitting: Family Medicine

## 2015-05-19 ENCOUNTER — Encounter (INDEPENDENT_AMBULATORY_CARE_PROVIDER_SITE_OTHER): Payer: Federal, State, Local not specified - PPO | Admitting: Ophthalmology

## 2015-05-19 DIAGNOSIS — H43813 Vitreous degeneration, bilateral: Secondary | ICD-10-CM | POA: Diagnosis not present

## 2015-05-19 DIAGNOSIS — H353231 Exudative age-related macular degeneration, bilateral, with active choroidal neovascularization: Secondary | ICD-10-CM | POA: Diagnosis not present

## 2015-05-19 DIAGNOSIS — H35033 Hypertensive retinopathy, bilateral: Secondary | ICD-10-CM

## 2015-05-19 DIAGNOSIS — I10 Essential (primary) hypertension: Secondary | ICD-10-CM

## 2015-06-23 ENCOUNTER — Encounter (INDEPENDENT_AMBULATORY_CARE_PROVIDER_SITE_OTHER): Payer: Federal, State, Local not specified - PPO | Admitting: Ophthalmology

## 2015-06-23 DIAGNOSIS — I10 Essential (primary) hypertension: Secondary | ICD-10-CM | POA: Diagnosis not present

## 2015-06-23 DIAGNOSIS — H353231 Exudative age-related macular degeneration, bilateral, with active choroidal neovascularization: Secondary | ICD-10-CM | POA: Diagnosis not present

## 2015-06-23 DIAGNOSIS — H43813 Vitreous degeneration, bilateral: Secondary | ICD-10-CM

## 2015-06-23 DIAGNOSIS — H35033 Hypertensive retinopathy, bilateral: Secondary | ICD-10-CM | POA: Diagnosis not present

## 2015-07-28 ENCOUNTER — Other Ambulatory Visit: Payer: Self-pay | Admitting: Family Medicine

## 2015-07-28 ENCOUNTER — Encounter (INDEPENDENT_AMBULATORY_CARE_PROVIDER_SITE_OTHER): Payer: Federal, State, Local not specified - PPO | Admitting: Ophthalmology

## 2015-07-28 DIAGNOSIS — H43813 Vitreous degeneration, bilateral: Secondary | ICD-10-CM

## 2015-07-28 DIAGNOSIS — H35033 Hypertensive retinopathy, bilateral: Secondary | ICD-10-CM

## 2015-07-28 DIAGNOSIS — I10 Essential (primary) hypertension: Secondary | ICD-10-CM | POA: Diagnosis not present

## 2015-07-28 DIAGNOSIS — H353231 Exudative age-related macular degeneration, bilateral, with active choroidal neovascularization: Secondary | ICD-10-CM

## 2015-08-15 ENCOUNTER — Other Ambulatory Visit: Payer: Self-pay | Admitting: Family Medicine

## 2015-08-21 ENCOUNTER — Other Ambulatory Visit: Payer: Self-pay | Admitting: Family Medicine

## 2015-09-01 ENCOUNTER — Encounter (INDEPENDENT_AMBULATORY_CARE_PROVIDER_SITE_OTHER): Payer: Federal, State, Local not specified - PPO | Admitting: Ophthalmology

## 2015-09-01 DIAGNOSIS — I10 Essential (primary) hypertension: Secondary | ICD-10-CM | POA: Diagnosis not present

## 2015-09-01 DIAGNOSIS — H43813 Vitreous degeneration, bilateral: Secondary | ICD-10-CM

## 2015-09-01 DIAGNOSIS — H35033 Hypertensive retinopathy, bilateral: Secondary | ICD-10-CM | POA: Diagnosis not present

## 2015-09-01 DIAGNOSIS — H353231 Exudative age-related macular degeneration, bilateral, with active choroidal neovascularization: Secondary | ICD-10-CM | POA: Diagnosis not present

## 2015-09-06 ENCOUNTER — Encounter: Payer: Self-pay | Admitting: Cardiology

## 2015-09-10 ENCOUNTER — Ambulatory Visit (INDEPENDENT_AMBULATORY_CARE_PROVIDER_SITE_OTHER): Payer: Federal, State, Local not specified - PPO | Admitting: Family Medicine

## 2015-09-10 ENCOUNTER — Encounter: Payer: Self-pay | Admitting: Family Medicine

## 2015-09-10 VITALS — BP 149/66 | HR 65 | Temp 97.1°F | Ht 63.0 in | Wt 124.0 lb

## 2015-09-10 DIAGNOSIS — E785 Hyperlipidemia, unspecified: Secondary | ICD-10-CM

## 2015-09-10 DIAGNOSIS — E039 Hypothyroidism, unspecified: Secondary | ICD-10-CM | POA: Diagnosis not present

## 2015-09-10 DIAGNOSIS — I10 Essential (primary) hypertension: Secondary | ICD-10-CM

## 2015-09-10 MED ORDER — AMLODIPINE BESYLATE 5 MG PO TABS: 5.0000 mg | ORAL_TABLET | Freq: Every day | ORAL | Status: DC

## 2015-09-10 NOTE — Progress Notes (Signed)
Subjective:    Patient ID: Jenna Ramos, female    DOB: 27-Dec-1928, 82 y.o.   MRN: [id number]  HPI Pt here for follow up and management of chronic medical problems which includes hypothyroid, HTN and hyperlipidemia. She is taking medications regularly. She really has no concerns or complaints today. Needs some refills on her medicines. Blood pressure is good at 149/66 on benazepril and amlodipine. She has been released by her vascular surgeon after her carotid surgeries She is gardening and is active. She has had no falls or balance issues.     Patient Active Problem List   Diagnosis Date Noted  . Hypothyroidism   . Hyperlipidemia   . Essential hypertension 05/04/2014  . Hip pain 03/31/2014  . TIA (transient ischemic attack) 01/23/2014  . Carotid artery disease ([place]) 01/14/2014  . 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 Fibromyalgia muscle pain 10/28/2013  . Acute blood loss anemia 10/28/2013  . Hip fracture requiring operative repair ([place]) 10/17/2013  . Anemia    Outpatient Encounter Prescriptions as of 09/10/2015  Medication Sig  . amLODipine (NORVASC) 5 MG tablet Take 1 tablet (5 mg total) by mouth daily.  Marland Kitchen aspirin 325 MG EC tablet Take 325 mg by mouth.  Marland Kitchen atorvastatin (LIPITOR) 40 MG tablet TAKE ONE TABLET BY MOUTH ONE TIME DAILY  . benazepril (LOTENSIN) 20 MG tablet TAKE ONE TABLET BY MOUTH ONE TIME DAILY  . BESIVANCE 0.6 % SUSP Place 1 drop into both eyes See admin instructions. Take day of eye shots and the day after  . bimatoprost (LUMIGAN) 0.03 % ophthalmic solution Place 1 drop into both eyes at bedtime.  . Calcium Carbonate-Vitamin D (CALCIUM 600/VITAMIN D) 600-400 MG-UNIT per tablet Take 1 tablet by mouth daily.   Marland Kitchen FERREX 150 150 MG capsule TAKE 1 CAPSULE BY MOUTH TWICE A DAY BEFORE LUNCH AND SUPPER AS INSTRUC TED (Patient taking differently: TAKE 1 CAPSULE BY MOUTH once A DAY or AS INSTRUCTED)  . levothyroxine (SYNTHROID,  LEVOTHROID) 88 MCG tablet Take 1 tablet (88 mcg total) by mouth daily. As directed  . Multiple Vitamins-Minerals (MULTIVITAMINS THER. W/MINERALS) TABS Take 1 tablet by mouth daily.  . Omega-3 Fatty Acids (FISH OIL) 1200 MG CAPS Take 1 capsule by mouth daily.   Marland Kitchen oxyCODONE (OXY IR/ROXICODONE) 5 MG immediate release tablet TAKE 1 TABLET EVERY 6 HOURS AS NEEDED FOR SEVERE PAIN  . [DISCONTINUED] amLODipine (NORVASC) 5 MG tablet TAKE ONE TABLET BY MOUTH ONE TIME DAILY   No facility-administered encounter medications on file as of 09/10/2015.      Review of Systems  Constitutional: Negative.   HENT: Negative.   Eyes: Negative.   Respiratory: Negative.   Cardiovascular: Negative.   Gastrointestinal: Negative.   Endocrine: Negative.   Genitourinary: Negative.   Musculoskeletal: Negative.   Skin: Negative.   Allergic/Immunologic: Negative.   Neurological: Negative.   Hematological: Negative.   Psychiatric/Behavioral: Negative.        Objective:   Physical Exam  Constitutional: She is oriented to person, place, and time. She appears well-developed and well-nourished.  Neck: Normal range of motion.  Strong carotid upstroke and no bruit  Cardiovascular: Normal rate, regular rhythm, normal heart sounds and intact distal pulses.   Pulmonary/Chest: Effort normal and breath sounds normal.  Musculoskeletal: Normal range of motion.  Neurological: She is alert and oriented to person, place, and time.  Psychiatric: She has a normal mood and affect. Her behavior is normal.   BP  149/66 mmHg  Pulse 65  Temp(Src) 97.1 F (36.2 C) (Oral)  Ht _0  (1.6 m)  Wt 124 lb (56.246 kg)  BMI 21.97 kg/m2        Assessment & Plan:  1. Essential hypertension Blood pressure is adequately controlled. Continue same medicines - CMP14+EGFR  2. Hyperlipidemia I gave patient option of discontinuing statin and she would like to do that. We'll get her baseline today on medicine and then follow at 6 month  intervals - Lipid panel  3. Hypothyroidism, unspecified hypothyroidism type Have not checked TSH in one year need to update to see if any adjustments are to be made.  Wardell Honour MD - TSH

## 2015-09-10 NOTE — Patient Instructions (Addendum)
Medicare Annual Wellness Visit  [facility] and the medical providers at [facility] strive to bring you the best medical care.  In doing so we not only want to address your current medical conditions and concerns but also to detect new conditions early and prevent illness, disease and health-related problems.    Medicare offers a yearly Wellness Visit which allows our clinical staff to assess your need for preventative services including immunizations, lifestyle education, counseling to decrease risk of preventable diseases and screening for fall risk and other medical concerns.    This visit is provided free of charge (no copay) for all Medicare recipients. The clinical pharmacists at [facility] have begun to conduct these Wellness Visits which will also include a thorough review of all your medications.    As you primary medical provider recommend that you make an appointment for your Annual Wellness Visit if you have not done so already this year.  You may set up this appointment before you leave today or you may call back [phone] and schedule an appointment.  Please make sure when you call that you mention that you are scheduling your Annual Wellness Visit with the clinical pharmacist so that the appointment may be made for the proper length of time.     Continue current medications. Continue good therapeutic lifestyle changes which include good diet and exercise. Fall precautions discussed with patient. If an FOBT was given today- please return it to our front desk. If you are over 27 years old - you may need Prevnar 75 or the adult Pneumonia vaccine.   After your visit with [place] today you will receive a survey in the mail or online from [employer] regarding your care with [place]. Please take a moment to fill this out. Your feedback is very important to [place] as you can help [place] better understand your patient needs as well as  improve your experience and satisfaction. WE CARE ABOUT YOU!!!   STOP your Atorvastatin (Lipitor)  - we will continue to monitor your cholesterol numbers

## 2015-09-11 LAB — CMP14+EGFR
ALBUMIN: 4.8 g/dL — AB (ref 3.5–4.7)
ALK PHOS: 79 IU/L (ref 39–117)
ALT: 20 IU/L (ref 0–32)
AST: 26 IU/L (ref 0–40)
Albumin/Globulin Ratio: 1.7 (ref 1.2–2.2)
BILIRUBIN TOTAL: 0.6 mg/dL (ref 0.0–1.2)
BUN/Creatinine Ratio: 23 (ref 12–28)
BUN: 16 mg/dL (ref 8–27)
CHLORIDE: 101 mmol/L (ref 96–106)
CO2: 26 mmol/L (ref 18–29)
CREATININE: 0.71 mg/dL (ref 0.57–1.00)
Calcium: 10 mg/dL (ref 8.7–10.3)
GFR calc Af Amer: 90 mL/min/{1.73_m2} (ref >59)
GFR calc non Af Amer: 78 mL/min/{1.73_m2} (ref >59)
GLUCOSE: 110 mg/dL — AB (ref 65–99)
Globulin, Total: 2.9 g/dL (ref 1.5–4.5)
Potassium: 4.2 mmol/L (ref 3.5–5.2)
Sodium: 142 mmol/L (ref 134–144)
Total Protein: 7.7 g/dL (ref 6.0–8.5)

## 2015-09-11 LAB — LIPID PANEL
CHOLESTEROL TOTAL: 154 mg/dL (ref 100–199)
Chol/HDL Ratio: 2.9 ratio units (ref 0.0–4.4)
HDL: 54 mg/dL (ref >39)
LDL CALC: 66 mg/dL (ref 0–99)
TRIGLYCERIDES: 172 mg/dL — AB (ref 0–149)
VLDL CHOLESTEROL CAL: 34 mg/dL (ref 5–40)

## 2015-09-11 LAB — TSH: TSH: 0.757 u[IU]/mL (ref 0.450–4.500)

## 2015-10-13 ENCOUNTER — Encounter (INDEPENDENT_AMBULATORY_CARE_PROVIDER_SITE_OTHER): Payer: Federal, State, Local not specified - PPO | Admitting: Ophthalmology

## 2015-10-13 DIAGNOSIS — H35033 Hypertensive retinopathy, bilateral: Secondary | ICD-10-CM | POA: Diagnosis not present

## 2015-10-13 DIAGNOSIS — H353231 Exudative age-related macular degeneration, bilateral, with active choroidal neovascularization: Secondary | ICD-10-CM

## 2015-10-13 DIAGNOSIS — I10 Essential (primary) hypertension: Secondary | ICD-10-CM

## 2015-10-13 DIAGNOSIS — H43813 Vitreous degeneration, bilateral: Secondary | ICD-10-CM

## 2015-10-14 ENCOUNTER — Other Ambulatory Visit: Payer: Self-pay | Admitting: Family Medicine

## 2015-10-24 ENCOUNTER — Other Ambulatory Visit: Payer: Self-pay | Admitting: Family Medicine

## 2015-11-18 ENCOUNTER — Other Ambulatory Visit: Payer: Self-pay | Admitting: Family Medicine

## 2015-11-24 ENCOUNTER — Encounter (INDEPENDENT_AMBULATORY_CARE_PROVIDER_SITE_OTHER): Payer: Federal, State, Local not specified - PPO | Admitting: Ophthalmology

## 2015-11-24 DIAGNOSIS — H353231 Exudative age-related macular degeneration, bilateral, with active choroidal neovascularization: Secondary | ICD-10-CM | POA: Diagnosis not present

## 2015-11-24 DIAGNOSIS — H43813 Vitreous degeneration, bilateral: Secondary | ICD-10-CM

## 2015-11-24 DIAGNOSIS — H35033 Hypertensive retinopathy, bilateral: Secondary | ICD-10-CM | POA: Diagnosis not present

## 2015-11-24 DIAGNOSIS — I10 Essential (primary) hypertension: Secondary | ICD-10-CM | POA: Diagnosis not present

## 2015-11-29 ENCOUNTER — Ambulatory Visit (INDEPENDENT_AMBULATORY_CARE_PROVIDER_SITE_OTHER): Payer: Federal, State, Local not specified - PPO

## 2015-11-29 DIAGNOSIS — Z23 Encounter for immunization: Secondary | ICD-10-CM

## 2016-01-05 ENCOUNTER — Encounter (INDEPENDENT_AMBULATORY_CARE_PROVIDER_SITE_OTHER): Payer: Federal, State, Local not specified - PPO | Admitting: Ophthalmology

## 2016-01-05 DIAGNOSIS — H353231 Exudative age-related macular degeneration, bilateral, with active choroidal neovascularization: Secondary | ICD-10-CM

## 2016-01-05 DIAGNOSIS — H43813 Vitreous degeneration, bilateral: Secondary | ICD-10-CM | POA: Diagnosis not present

## 2016-01-05 DIAGNOSIS — H35033 Hypertensive retinopathy, bilateral: Secondary | ICD-10-CM | POA: Diagnosis not present

## 2016-01-05 DIAGNOSIS — I10 Essential (primary) hypertension: Secondary | ICD-10-CM

## 2016-01-13 ENCOUNTER — Ambulatory Visit (INDEPENDENT_AMBULATORY_CARE_PROVIDER_SITE_OTHER): Payer: Federal, State, Local not specified - PPO | Admitting: Family Medicine

## 2016-01-13 ENCOUNTER — Encounter: Payer: Self-pay | Admitting: Family Medicine

## 2016-01-13 VITALS — BP 171/79 | HR 67 | Temp 97.1°F | Ht 63.0 in | Wt 128.0 lb

## 2016-01-13 DIAGNOSIS — I10 Essential (primary) hypertension: Secondary | ICD-10-CM | POA: Diagnosis not present

## 2016-01-13 DIAGNOSIS — E782 Mixed hyperlipidemia: Secondary | ICD-10-CM | POA: Diagnosis not present

## 2016-01-13 NOTE — Progress Notes (Signed)
Subjective:    Patient ID: Jenna Ramos, female    DOB: [birth year], 82 y.o.   MRN: [id number]  HPI 82 year old female who is here to follow-up blood pressure and lipids. Blood pressures are normally pre-well controlled but today is elevated at 167/66. We had stopped the statin both her request and my thought that this probably not a good drug for octogenarians and above. She feels well and has no specific complaints  Patient Active Problem List   Diagnosis Date Noted  . Hypothyroidism   . Hyperlipidemia   . Essential hypertension 05/04/2014  . Hip pain 03/31/2014  . TIA (transient ischemic attack) 01/23/2014  . Carotid artery disease ([place]) 01/14/2014  . 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 Fibromyalgia muscle pain 10/28/2013  . Acute blood loss anemia 10/28/2013  . Hip fracture requiring operative repair ([place]) 10/17/2013  . Anemia    Outpatient Encounter Prescriptions as of 01/13/2016  Medication Sig  . amLODipine (NORVASC) 5 MG tablet Take 1 tablet (5 mg total) by mouth daily.  Marland Kitchen aspirin 325 MG EC tablet Take 325 mg by mouth.  . benazepril (LOTENSIN) 20 MG tablet TAKE ONE TABLET BY MOUTH ONE TIME DAILY  . BESIVANCE 0.6 % SUSP Place 1 drop into both eyes See admin instructions. Take day of eye shots and the day after  . bimatoprost (LUMIGAN) 0.03 % ophthalmic solution Place 1 drop into both eyes at bedtime.  . Calcium Carbonate-Vitamin D (CALCIUM 600/VITAMIN D) 600-400 MG-UNIT per tablet Take 1 tablet by mouth daily.   Marland Kitchen FERREX 150 150 MG capsule TAKE 1 CAPSULE BY MOUTH TWICE A DAY BEFORE LUNCH AND SUPPER AS INSTRUC TED (Patient taking differently: TAKE 1 CAPSULE BY MOUTH once A DAY or AS INSTRUCTED)  . levothyroxine (SYNTHROID, LEVOTHROID) 88 MCG tablet TAKE 1 TABLET (88 MCG TOTAL) BY MOUTH DAILY  . Multiple Vitamins-Minerals (MULTIVITAMINS THER. W/MINERALS) TABS Take 1 tablet by mouth daily.  . Omega-3 Fatty Acids (FISH OIL) 1200 MG CAPS  Take 1 capsule by mouth daily.   Marland Kitchen atorvastatin (LIPITOR) 40 MG tablet TAKE ONE TABLET BY MOUTH ONE TIME DAILY (Patient not taking: Reported on 01/13/2016)  . oxyCODONE (OXY IR/ROXICODONE) 5 MG immediate release tablet TAKE 1 TABLET EVERY 6 HOURS AS NEEDED FOR SEVERE PAIN (Patient not taking: Reported on 01/13/2016)   No facility-administered encounter medications on file as of 01/13/2016.       Review of Systems  Constitutional: Negative.   Respiratory: Negative.   Cardiovascular: Negative.   Neurological: Negative.   Psychiatric/Behavioral: Negative.        Objective:   Physical Exam  Constitutional: She is oriented to person, place, and time. She appears well-developed and well-nourished.  HENT:  Mouth/Throat: Oropharynx is clear and moist.  Neck: Normal range of motion.  Cardiovascular: Normal rate, regular rhythm and normal heart sounds.   Pulmonary/Chest: Effort normal and breath sounds normal.  Neurological: She is alert and oriented to person, place, and time.  Psychiatric: She has a normal mood and affect. Her behavior is normal. Thought content normal.   BP (!) 171/79   Pulse 67   Temp 97.1 F (36.2 C) (Oral)   Ht 5\' 3"  (1.6 m)   Wt 128 lb (58.1 kg)   BMI 22.67 kg/m         Assessment & Plan:  1. Mixed hyperlipidemia After having stopped statin at last visit need to reassess. I hope we can keep her off statins going  forward - Lipid panel   2. Essential hypertension Blood pressure is elevated today. Have asked patient to monitor it at home where it is usually normal.  Wardell Honour MD

## 2016-01-14 LAB — LIPID PANEL
CHOL/HDL RATIO: 5.2 ratio — AB (ref 0.0–4.4)
CHOLESTEROL TOTAL: 262 mg/dL — AB (ref 100–199)
HDL: 50 mg/dL (ref >39)
LDL CALC: 141 mg/dL — AB (ref 0–99)
Triglycerides: 356 mg/dL — ABNORMAL HIGH (ref 0–149)
VLDL Cholesterol Cal: 71 mg/dL — ABNORMAL HIGH (ref 5–40)

## 2016-01-20 ENCOUNTER — Telehealth: Payer: Self-pay | Admitting: Family Medicine

## 2016-01-21 NOTE — Telephone Encounter (Signed)
Labs sent to patient

## 2016-02-16 ENCOUNTER — Encounter (INDEPENDENT_AMBULATORY_CARE_PROVIDER_SITE_OTHER): Payer: Federal, State, Local not specified - PPO | Admitting: Ophthalmology

## 2016-02-16 DIAGNOSIS — H43813 Vitreous degeneration, bilateral: Secondary | ICD-10-CM | POA: Diagnosis not present

## 2016-02-16 DIAGNOSIS — H35033 Hypertensive retinopathy, bilateral: Secondary | ICD-10-CM

## 2016-02-16 DIAGNOSIS — I10 Essential (primary) hypertension: Secondary | ICD-10-CM

## 2016-02-16 DIAGNOSIS — H353231 Exudative age-related macular degeneration, bilateral, with active choroidal neovascularization: Secondary | ICD-10-CM

## 2016-03-29 ENCOUNTER — Encounter (INDEPENDENT_AMBULATORY_CARE_PROVIDER_SITE_OTHER): Payer: Federal, State, Local not specified - PPO | Admitting: Ophthalmology

## 2016-03-29 DIAGNOSIS — H353231 Exudative age-related macular degeneration, bilateral, with active choroidal neovascularization: Secondary | ICD-10-CM | POA: Diagnosis not present

## 2016-03-29 DIAGNOSIS — I10 Essential (primary) hypertension: Secondary | ICD-10-CM | POA: Diagnosis not present

## 2016-03-29 DIAGNOSIS — H35033 Hypertensive retinopathy, bilateral: Secondary | ICD-10-CM | POA: Diagnosis not present

## 2016-03-29 DIAGNOSIS — H43813 Vitreous degeneration, bilateral: Secondary | ICD-10-CM | POA: Diagnosis not present

## 2016-04-07 ENCOUNTER — Encounter: Payer: Self-pay | Admitting: Family

## 2016-04-14 ENCOUNTER — Encounter: Payer: Self-pay | Admitting: Family

## 2016-04-14 ENCOUNTER — Ambulatory Visit (HOSPITAL_COMMUNITY)
Admission: RE | Admit: 2016-04-14 | Discharge: 2016-04-14 | Disposition: A | Discharge status: Home / Self Care | Payer: Federal, State, Local not specified - PPO | Source: Ambulatory Visit | Attending: Vascular Surgery | Admitting: Vascular Surgery

## 2016-04-14 ENCOUNTER — Ambulatory Visit (INDEPENDENT_AMBULATORY_CARE_PROVIDER_SITE_OTHER): Payer: Federal, State, Local not specified - PPO | Admitting: Family

## 2016-04-14 ENCOUNTER — Other Ambulatory Visit: Payer: Self-pay

## 2016-04-14 VITALS — BP 149/69 | HR 70 | Temp 98.4°F | Resp 16 | Ht 63.0 in | Wt 125.1 lb

## 2016-04-14 DIAGNOSIS — I739 Peripheral vascular disease, unspecified: Secondary | ICD-10-CM

## 2016-04-14 DIAGNOSIS — I779 Disorder of arteries and arterioles, unspecified: Secondary | ICD-10-CM | POA: Diagnosis not present

## 2016-04-14 DIAGNOSIS — Z9889 Other specified postprocedural states: Secondary | ICD-10-CM | POA: Insufficient documentation

## 2016-04-14 DIAGNOSIS — I6521 Occlusion and stenosis of right carotid artery: Secondary | ICD-10-CM | POA: Insufficient documentation

## 2016-04-14 LAB — VAS US CAROTID
LCCADSYS: -88 cm/s
LEFT ECA DIAS: -5 cm/s
LEFT VERTEBRAL DIAS: 10 cm/s
LICADDIAS: -13 cm/s
LICAPDIAS: -11 cm/s
Left CCA dist dias: -6 cm/s
Left CCA prox dias: 12 cm/s
Left CCA prox sys: 86 cm/s
Left ICA dist sys: -106 cm/s
Left ICA prox sys: -59 cm/s
RCCADSYS: -76 cm/s
RCCAPDIAS: 8 cm/s
RCCAPSYS: 73 cm/s
RIGHT CCA MID DIAS: 10 cm/s
RIGHT ECA DIAS: 6 cm/s
RIGHT VERTEBRAL DIAS: -4 cm/s

## 2016-04-14 NOTE — Progress Notes (Signed)
Chief Complaint: Follow up Extracranial Carotid Artery Stenosis   History of Present Illness  Jenna Ramos is a 83 y.o. female who is s/p L CEA 02/19/14 for sx L ICA stenosis >70%.  The patient had been lost to follow-up when Dr. Bridgett Larsson evaluated her on 04-09-15.  Patient has history of TIA with receptive aphasia.  The patient has never had amaurosis fugax or monocular blindness.  The patient has never had facial drooping or hemiplegia.  The patient has never had expressive aphasia.    She fell and fractured her left wrist and left femur in August 2015, had left femur ORIF, is walking well.  She has fibromyalgia.  She states her blood pressure at home is less than 135/60.  Her husband died in June 07, 2011.  Pt Diabetic: no Pt smoker: former smoker, quit over 60 years ago  Pt meds include: Statin : yes ASA: yes Other anticoagulants/antiplatelets: no   Past Medical History:  Diagnosis Date  . Anemia    takes Iron pill daily  . Arthritis   . Carotid artery occlusion   . Fibromyalgia   . Fibromyalgia   . Glaucoma    uses Eye Drops daily  . HOH (hard of hearing)   . Hyperlipidemia    takes Atorvastatin daily  . Hypertension    takes Benazepril daily  . Hypothyroidism    takes Synthroid daily  . Macular degeneration    wet and gets injections in both eyes  . Nocturia   . Peripheral vascular disease ([place])   . Rectal incontinence   . Stroke [facility])     Social History Social History  Substance Use Topics  . Smoking status: Former Research scientist (life sciences)  . Smokeless tobacco: Never Used     Comment: quit smoking 62yrs ago  . Alcohol use No    Family History Family History  Problem Relation Age of Onset  . Prostate cancer Father   . Cancer Father   . Lung disease Sister   . Cancer Brother     Surgical History Past Surgical History:  Procedure Laterality Date  . APPENDECTOMY    . CAROTID ENDARTERECTOMY    . cataract surgery Bilateral   . COLONOSCOPY    . ENDARTERECTOMY Left  02/19/2014   Procedure: ENDARTERECTOMY CAROTID;  Surgeon: [employer], MD;  Location: [facility];  Service: Vascular;  Laterality: Left;  . ESOPHAGOGASTRODUODENOSCOPY    . TONSILLECTOMY    . TOTAL HIP ARTHROPLASTY Left 10/2013  . TUBAL LIGATION    . WRIST SURGERY     pins and screws    Allergies  Allergen Reactions  . Aggrenox [Aspirin-Dipyridamole Er] Anaphylaxis    Severe headache  . Codeine Other (See Comments)    Feet burn and agitation  . Morphine And Related Other (See Comments)    Feet burn and agitation    Current Outpatient Prescriptions  Medication Sig Dispense Refill  . amLODipine (NORVASC) 5 MG tablet Take 1 tablet (5 mg total) by mouth daily. 90 tablet 3  . aspirin 325 MG EC tablet Take 325 mg by mouth.    . benazepril (LOTENSIN) 20 MG tablet TAKE ONE TABLET BY MOUTH ONE TIME DAILY 90 tablet 1  . BESIVANCE 0.6 % SUSP Place 1 drop into both eyes See admin instructions. Take day of eye shots and the day after    . bimatoprost (LUMIGAN) 0.03 % ophthalmic solution Place 1 drop into both eyes at bedtime.    . Calcium Carbonate-Vitamin D (CALCIUM 600/VITAMIN  D) 600-400 MG-UNIT per tablet Take 1 tablet by mouth daily.     Marland Kitchen FERREX 150 150 MG capsule TAKE 1 CAPSULE BY MOUTH TWICE A DAY BEFORE LUNCH AND SUPPER AS INSTRUC TED (Patient taking differently: TAKE 1 CAPSULE BY MOUTH once A DAY or AS INSTRUCTED) 60 capsule 3  . levothyroxine (SYNTHROID, LEVOTHROID) 88 MCG tablet TAKE 1 TABLET (88 MCG TOTAL) BY MOUTH DAILY 90 tablet 2  . Multiple Vitamins-Minerals (MULTIVITAMINS THER. W/MINERALS) TABS Take 1 tablet by mouth daily.    . Omega-3 Fatty Acids (FISH OIL) 1200 MG CAPS Take 1 capsule by mouth daily.     Marland Kitchen atorvastatin (LIPITOR) 40 MG tablet TAKE ONE TABLET BY MOUTH ONE TIME DAILY (Patient not taking: Reported on 01/13/2016) 90 tablet 0  . oxyCODONE (OXY IR/ROXICODONE) 5 MG immediate release tablet TAKE 1 TABLET EVERY 6 HOURS AS NEEDED FOR SEVERE PAIN (Patient not taking: Reported on  01/13/2016) 15 tablet 0   No current facility-administered medications for this visit.     Review of Systems : See HPI for pertinent positives and negatives.  Physical Examination  Vitals:   04/14/16 1141 04/14/16 1142  BP: (!) 153/74 (!) 149/69  Pulse: 70   Resp: 16   Temp: 98.4 F (36.9 C)   TempSrc: Oral   SpO2: 98%   Weight: 125 lb 1.6 oz (56.7 kg)   Height: 5\' 3"  (1.6 m)    Body mass index is 22.16 kg/m.  General: A&O x 3, WD, elderly, thin  Eyes: PERRLA, EOMI  Neck: Supple,  nuchal rigidity,  palpable LAD, inc well healed  Pulmonary: Sym exp, good air movt, CTAB, no rales, rhonchi, & wheezing  Cardiac: RRR, Nl S1, S2, no Murmurs, rubs or gallops  Vascular: Vessel Right Left  Radial Palpable Palpable  Brachial Palpable Palpable  Carotid Palpable, without bruit Palpable, without bruit  Aorta Not palpable N/A  Femoral Palpable Palpable  Popliteal Not palpable Not palpable  PT Palpable Palpable  DP Palpable Palpable   Gastrointestinal: soft, NTND, no G/R, no HSM, no masses, no CVAT B  Musculoskeletal: M/S 5/5 throughout , Extremities without ischemic changes   Neurologic: CN 2-12 intact , Pain and light touch intact in extremities , Motor exam as listed above      Assessment: Jenna Ramos is a 83 y.o. female who is s/p L CEA 02/19/14 for sx L ICA stenosis >70%.   She had a preoperative stroke, but no subsequent stroke or TIA.   Her atherosclerotic risk factors include advanced age. Fortunately she does not have DM, quit smoking over 60 years ago, has a normal BMI, and stays physically active.   Dr. Bridgett Larsson indicates on his assessment of 04-09-15, that if If next year's studies remain impressively minimal, may switch to q2 year studies.  DATA Today's carotid duplex suggests <40% stenosis of the right ICA and left endarterectomy site with no restenosis. >50% stenosis of the right ECA. Bilateral vertebral artery flow is antegrade.   Bilateral subclavian artery waveforms are normal.  No significant change since the last exam on 04-08-16.    Plan: Follow-up in 2 years with Carotid Duplex scan.   I discussed in depth with the patient the nature of atherosclerosis, and emphasized the importance of maximal medical management including strict control of blood pressure, blood glucose, and lipid levels, obtaining regular exercise, and continued cessation of smoking.  The patient is aware that without maximal medical management the underlying atherosclerotic disease process will progress, limiting the  benefit of any interventions. The patient was given information about stroke prevention and what symptoms should prompt the patient to seek immediate medical care. Thank you for allowing [place] to participate in this patient's care.  Clemon Chambers, RN, MSN, FNP-C [facility] Office: [phone]  Clinic Physician: Donzetta Matters  04/14/16 11:57 AM

## 2016-04-14 NOTE — Patient Instructions (Signed)
Stroke Prevention Some medical conditions and behaviors are associated with an increased chance of having a stroke. You may prevent a stroke by making healthy choices and managing medical conditions. How can I reduce my risk of having a stroke?  Stay physically active. Get at least 30 minutes of activity on most or all days.  Do not smoke. It may also be helpful to avoid exposure to secondhand smoke.  Limit alcohol use. Moderate alcohol use is considered to be:  No more than 2 drinks per day for men.  No more than 1 drink per day for nonpregnant women.  Eat healthy foods. This involves:  Eating 5 or more servings of fruits and vegetables a day.  Making dietary changes that address high blood pressure (hypertension), high cholesterol, diabetes, or obesity.  Manage your cholesterol levels.  Making food choices that are high in fiber and low in saturated fat, trans fat, and cholesterol may control cholesterol levels.  Take any prescribed medicines to control cholesterol as directed by your health care provider.  Manage your diabetes.  Controlling your carbohydrate and sugar intake is recommended to manage diabetes.  Take any prescribed medicines to control diabetes as directed by your health care provider.  Control your hypertension.  Making food choices that are low in salt (sodium), saturated fat, trans fat, and cholesterol is recommended to manage hypertension.  Ask your health care provider if you need treatment to lower your blood pressure. Take any prescribed medicines to control hypertension as directed by your health care provider.  If you are 18-39 years of age, have your blood pressure checked every 3-5 years. If you are 40 years of age or older, have your blood pressure checked every year.  Maintain a healthy weight.  Reducing calorie intake and making food choices that are low in sodium, saturated fat, trans fat, and cholesterol are recommended to manage  weight.  Stop drug abuse.  Avoid taking birth control pills.  Talk to your health care provider about the risks of taking birth control pills if you are over 35 years old, smoke, get migraines, or have ever had a blood clot.  Get evaluated for sleep disorders (sleep apnea).  Talk to your health care provider about getting a sleep evaluation if you snore a lot or have excessive sleepiness.  Take medicines only as directed by your health care provider.  For some people, aspirin or blood thinners (anticoagulants) are helpful in reducing the risk of forming abnormal blood clots that can lead to stroke. If you have the irregular heart rhythm of atrial fibrillation, you should be on a blood thinner unless there is a good reason you cannot take them.  Understand all your medicine instructions.  Make sure that other conditions (such as anemia or atherosclerosis) are addressed. Get help right away if:  You have sudden weakness or numbness of the face, arm, or leg, especially on one side of the body.  Your face or eyelid droops to one side.  You have sudden confusion.  You have trouble speaking (aphasia) or understanding.  You have sudden trouble seeing in one or both eyes.  You have sudden trouble walking.  You have dizziness.  You have a loss of balance or coordination.  You have a sudden, severe headache with no known cause.  You have new chest pain or an irregular heartbeat. Any of these symptoms may represent a serious problem that is an emergency. Do not wait to see if the symptoms will go away.   Get medical help at once. Call your local emergency services (911 in [place] Do not drive yourself to the hospital. This information is not intended to replace advice given to you by your health care provider. Make sure you discuss any questions you have with your health care provider. Document Released: 03/30/2004 Document Revised: 07/29/2015 Document Reviewed: 08/23/2012 Elsevier  Interactive Patient Education  2017 [employer].  

## 2016-04-19 ENCOUNTER — Other Ambulatory Visit: Payer: Self-pay | Admitting: *Deleted

## 2016-04-19 MED ORDER — POLYSACCHARIDE IRON COMPLEX 150 MG PO CAPS: ORAL_CAPSULE | ORAL | 3 refills | Status: DC

## 2016-04-24 ENCOUNTER — Other Ambulatory Visit: Payer: Self-pay | Admitting: Family Medicine

## 2016-04-25 NOTE — Addendum Note (Signed)
Addended by: Lianne Cure A on: 04/25/2016 04:27 PM   Modules accepted: Orders

## 2016-05-10 ENCOUNTER — Encounter (INDEPENDENT_AMBULATORY_CARE_PROVIDER_SITE_OTHER): Payer: Federal, State, Local not specified - PPO | Admitting: Ophthalmology

## 2016-05-18 ENCOUNTER — Encounter (INDEPENDENT_AMBULATORY_CARE_PROVIDER_SITE_OTHER): Payer: Federal, State, Local not specified - PPO | Admitting: Ophthalmology

## 2016-05-18 DIAGNOSIS — H43813 Vitreous degeneration, bilateral: Secondary | ICD-10-CM

## 2016-05-18 DIAGNOSIS — I10 Essential (primary) hypertension: Secondary | ICD-10-CM | POA: Diagnosis not present

## 2016-05-18 DIAGNOSIS — H35033 Hypertensive retinopathy, bilateral: Secondary | ICD-10-CM

## 2016-05-18 DIAGNOSIS — H353231 Exudative age-related macular degeneration, bilateral, with active choroidal neovascularization: Secondary | ICD-10-CM | POA: Diagnosis not present

## 2016-06-12 ENCOUNTER — Encounter (INDEPENDENT_AMBULATORY_CARE_PROVIDER_SITE_OTHER): Payer: Self-pay

## 2016-06-12 ENCOUNTER — Encounter: Payer: Self-pay | Admitting: Family Medicine

## 2016-06-12 ENCOUNTER — Ambulatory Visit: Payer: Federal, State, Local not specified - PPO | Admitting: Family Medicine

## 2016-06-12 ENCOUNTER — Ambulatory Visit (INDEPENDENT_AMBULATORY_CARE_PROVIDER_SITE_OTHER): Payer: Federal, State, Local not specified - PPO | Admitting: Family Medicine

## 2016-06-12 VITALS — BP 169/73 | HR 67 | Temp 97.1°F | Ht 63.0 in | Wt 127.8 lb

## 2016-06-12 DIAGNOSIS — E039 Hypothyroidism, unspecified: Secondary | ICD-10-CM | POA: Diagnosis not present

## 2016-06-12 DIAGNOSIS — E782 Mixed hyperlipidemia: Secondary | ICD-10-CM

## 2016-06-12 DIAGNOSIS — I10 Essential (primary) hypertension: Secondary | ICD-10-CM | POA: Diagnosis not present

## 2016-06-12 MED ORDER — POLYSACCHARIDE IRON COMPLEX 150 MG PO CAPS: 150.0000 mg | ORAL_CAPSULE | Freq: Every day | ORAL | 3 refills | Status: DC

## 2016-06-12 NOTE — Patient Instructions (Signed)
Great to see you!  Come [place] in about 1 month for a lab visit to have blood drawn. Please keep a blood pressur eloga and let us know if your BPs are above 140/90.   We can follow up in 6 months

## 2016-06-12 NOTE — Progress Notes (Signed)
   HPI  Patient presents today here to follow-up for chronic medical conditions.  Hyperlipidemia Patient stopped statin about 5 months ago, she would like to avoid statins in the future Has had TIA.  Hypertension 371I systolic at home Good med compliance No CP, HA  Hypothyroidism Good medication compliance, plans to come in in one month for labs. Clinically stable, no symptoms  PMH: Smoking status noted ROS: Per HPI  Objective: BP (!) 169/73   Pulse 67   Temp 97.1 F (36.2 C) (Oral)   Ht '5\' 3"'$  (1.6 m)   Wt 127 lb 12.8 oz (58 kg)   BMI 22.64 kg/m  Gen: NAD, alert, cooperative with exam HEENT: NCAT CV: RRR, good S1/S2, no murmur Resp: CTABL, no wheezes, non-labored Ext: No edema, warm Neuro: Alert and oriented, No gross deficits  Assessment and plan:  # Hypertension Elevated today, however reports good blood pressure at home. Continue beta blocker plus ACE inhibitor  # Hyperlipidemia With history of TIA Patient will like to avoid statins, she's been trying home remedies, vinegar plus tiny on a daily basis Repeat labs in one month per patient's preference  # Hypothyroidism Repeat labs in one month Continue Synthroid, clinically stable    Orders Placed This Encounter  Procedures  . Lipid panel    Standing Status:   Future    Standing Expiration Date:   06/12/2017  . CMP14+EGFR    Standing Status:   Future    Standing Expiration Date:   06/12/2017  . TSH    Standing Status:   Future    Standing Expiration Date:   06/12/2017  . CBC with Differential/Platelet    Standing Status:   Future    Standing Expiration Date:   06/12/2017     Laroy Apple, MD [facility] 06/12/2016, 5:12 PM

## 2016-06-28 ENCOUNTER — Ambulatory Visit (INDEPENDENT_AMBULATORY_CARE_PROVIDER_SITE_OTHER): Payer: Federal, State, Local not specified - PPO | Admitting: Family Medicine

## 2016-06-28 ENCOUNTER — Encounter: Payer: Self-pay | Admitting: Family Medicine

## 2016-06-28 ENCOUNTER — Ambulatory Visit (HOSPITAL_COMMUNITY)
Admission: RE | Admit: 2016-06-28 | Discharge: 2016-06-28 | Disposition: A | Discharge status: Home / Self Care | Payer: Federal, State, Local not specified - PPO | Source: Ambulatory Visit | Attending: Family Medicine | Admitting: Family Medicine

## 2016-06-28 VITALS — BP 161/75 | HR 84 | Temp 98.0°F | Ht 63.0 in | Wt 126.4 lb

## 2016-06-28 DIAGNOSIS — K5730 Diverticulosis of large intestine without perforation or abscess without bleeding: Secondary | ICD-10-CM | POA: Diagnosis not present

## 2016-06-28 DIAGNOSIS — R1030 Lower abdominal pain, unspecified: Secondary | ICD-10-CM | POA: Insufficient documentation

## 2016-06-28 DIAGNOSIS — Q6102 Congenital multiple renal cysts: Secondary | ICD-10-CM | POA: Diagnosis not present

## 2016-06-28 DIAGNOSIS — I700 Atherosclerosis of aorta: Secondary | ICD-10-CM | POA: Insufficient documentation

## 2016-06-28 DIAGNOSIS — K8020 Calculus of gallbladder without cholecystitis without obstruction: Secondary | ICD-10-CM | POA: Insufficient documentation

## 2016-06-28 DIAGNOSIS — I2510 Atherosclerotic heart disease of native coronary artery without angina pectoris: Secondary | ICD-10-CM | POA: Diagnosis not present

## 2016-06-28 LAB — POCT I-STAT CREATININE: CREATININE: 0.7 mg/dL (ref 0.44–1.00)

## 2016-06-28 MED ORDER — IOPAMIDOL (ISOVUE-300) INJECTION 61%
INTRAVENOUS | Status: AC
  Filled 2016-06-28: qty 30

## 2016-06-28 MED ORDER — CIPROFLOXACIN HCL 500 MG PO TABS: 500.0000 mg | ORAL_TABLET | Freq: Two times a day (BID) | ORAL | 0 refills | Status: DC

## 2016-06-28 MED ORDER — METRONIDAZOLE 500 MG PO TABS: 500.0000 mg | ORAL_TABLET | Freq: Three times a day (TID) | ORAL | 0 refills | Status: DC

## 2016-06-28 MED ORDER — IOPAMIDOL (ISOVUE-300) INJECTION 61%
100.0000 mL | Freq: Once | INTRAVENOUS | Status: AC | PRN
  Administered 2016-06-28: 100 mL via INTRAVENOUS

## 2016-06-28 NOTE — Patient Instructions (Signed)
Great to see you!   Abdominal Pain, Adult Many things can cause belly (abdominal) pain. Most times, belly pain is not dangerous. Many cases of belly pain can be watched and treated at home. Sometimes belly pain is serious, though. Your doctor will try to find the cause of your belly pain. Follow these instructions at home:  Take over-the-counter and prescription medicines only as told by your doctor. Do not take medicines that help you poop (laxatives) unless told to by your doctor.  Drink enough fluid to keep your pee (urine) clear or pale yellow.  Watch your belly pain for any changes.  Keep all follow-up visits as told by your doctor. This is important. Contact a doctor if:  Your belly pain changes or gets worse.  You are not hungry, or you lose weight without trying.  You are having trouble pooping (constipated) or have watery poop (diarrhea) for more than 2-3 days.  You have pain when you pee or poop.  Your belly pain wakes you up at night.  Your pain gets worse with meals, after eating, or with certain foods.  You are throwing up and cannot keep anything down.  You have a fever. Get help right away if:  Your pain does not go away as soon as your doctor says it should.  You cannot stop throwing up.  Your pain is only in areas of your belly, such as the right side or the left lower part of the belly.  You have bloody or black poop, or poop that looks like tar.  You have very bad pain, cramping, or bloating in your belly.  You have signs of not having enough fluid or water in your body (dehydration), such as:  Dark pee, very little pee, or no pee.  Cracked lips.  Dry mouth.  Sunken eyes.  Sleepiness.  Weakness. This information is not intended to replace advice given to you by your health care provider. Make sure you discuss any questions you have with your health care provider. Document Released: 08/09/2007 Document Revised: 09/10/2015 Document Reviewed:  08/04/2015 Elsevier Interactive Patient Education  2017 [employer].

## 2016-06-28 NOTE — Progress Notes (Signed)
HPI  Patient presents today here with abdominal pain.  Patient's when she's had 2-3 days of worsening abdominal pain. Pain is described as severe bilateral lower quadrant abdominal pain crampy with sharp intermittent pains.  Patient has had 2 BMs since the start of the pain with no improvement.  She's had chills but no overt fevers.  She also complains of malaise.   PMH: Smoking status noted ROS: Per HPI  Objective: BP (!) 161/75   Pulse 84   Temp 98 F (36.7 C) (Oral)   Ht 5' 3" (1.6 m)   Wt 126 lb 6.4 oz (57.3 kg)   BMI 22.39 kg/m  Gen: NAD, alert, cooperative with exam HEENT: NCAT CV: RRR, good S1/S2, no murmur Resp: CTABL, no wheezes, non-labored Abd: Soft, exquisite tenderness throughout especially along the bilateral lower quadrants, some tenderness also in the left upper quadrant. Positive bowel sounds. Ext: No edema, warm Neuro: Alert and oriented, No gross deficits  Assessment and plan:  # Bilateral lower abdominal pain Patient with severe lower abdominal pain. I'm concerned about acute process, and treating for acute diverticulitis. Patient has had a colonoscopy previously at an outside hospital, no report of diverticula at that time. Basic labs also. Very low threshold for return, may need ER evaluation if patient's pain worsens.     Orders Placed This Encounter  Procedures  . CT Abdomen Pelvis W Contrast    Standing Status:   Future    Standing Expiration Date:   09/27/2017    Order Specific Question:   If indicated for the ordered procedure, I authorize the administration of contrast media per Radiology protocol    Answer:   Yes    Order Specific Question:   Reason for Exam (SYMPTOM  OR DIAGNOSIS REQUIRED)    Answer:   Lower abd pain    Order Specific Question:   Preferred imaging location?    Answer:   [facility]    Order Specific Question:   Call Results- Best Contact Number?    Answer:   [phone] hold patient     Order Specific  Question:   Radiology Contrast Protocol - do NOT remove file path    Answer:   _[email or web address]  . CBC with Differential/Platelet  . CMP14+EGFR    Meds ordered this encounter  Medications  . Multiple Vitamins-Minerals (PRESERVISION/LUTEIN PO)    Sig: Take by mouth.  . ciprofloxacin (CIPRO) 500 MG tablet    Sig: Take 1 tablet (500 mg total) by mouth 2 (two) times daily.    Dispense:  14 tablet    Refill:  0  . metroNIDAZOLE (FLAGYL) 500 MG tablet    Sig: Take 1 tablet (500 mg total) by mouth 3 (three) times daily.    Dispense:  21 tablet    Refill:  0    Laroy Apple, MD [facility] 06/28/2016, 2:53 PM

## 2016-06-29 ENCOUNTER — Encounter (INDEPENDENT_AMBULATORY_CARE_PROVIDER_SITE_OTHER): Payer: Federal, State, Local not specified - PPO | Admitting: Ophthalmology

## 2016-06-29 ENCOUNTER — Telehealth: Payer: Self-pay | Admitting: Family Medicine

## 2016-06-29 LAB — CMP14+EGFR
ALBUMIN: 4.4 g/dL (ref 3.5–4.7)
ALT: 13 IU/L (ref 0–32)
AST: 15 IU/L (ref 0–40)
Albumin/Globulin Ratio: 1.5 (ref 1.2–2.2)
Alkaline Phosphatase: 76 IU/L (ref 39–117)
BUN / CREAT RATIO: 16 (ref 12–28)
BUN: 14 mg/dL (ref 8–27)
Bilirubin Total: 0.5 mg/dL (ref 0.0–1.2)
CALCIUM: 9.3 mg/dL (ref 8.7–10.3)
CO2: 20 mmol/L (ref 18–29)
CREATININE: 0.86 mg/dL (ref 0.57–1.00)
Chloride: 96 mmol/L (ref 96–106)
GFR calc Af Amer: 71 mL/min/{1.73_m2} (ref >59)
GFR, EST NON AFRICAN AMERICAN: 61 mL/min/{1.73_m2} (ref >59)
GLOBULIN, TOTAL: 3 g/dL (ref 1.5–4.5)
GLUCOSE: 98 mg/dL (ref 65–99)
Potassium: 4.3 mmol/L (ref 3.5–5.2)
SODIUM: 138 mmol/L (ref 134–144)
TOTAL PROTEIN: 7.4 g/dL (ref 6.0–8.5)

## 2016-06-29 LAB — CBC WITH DIFFERENTIAL/PLATELET
BASOS ABS: 0 10*3/uL (ref 0.0–0.2)
Basos: 0 %
EOS (ABSOLUTE): 0.1 10*3/uL (ref 0.0–0.4)
Eos: 1 %
HEMOGLOBIN: 13.4 g/dL (ref 11.1–15.9)
Hematocrit: 40.1 % (ref 34.0–46.6)
Immature Grans (Abs): 0 10*3/uL (ref 0.0–0.1)
Immature Granulocytes: 0 %
LYMPHS: 14 %
Lymphocytes Absolute: 2 10*3/uL (ref 0.7–3.1)
MCH: 28.5 pg (ref 26.6–33.0)
MCHC: 33.4 g/dL (ref 31.5–35.7)
MCV: 85 fL (ref 79–97)
MONOCYTES: 9 %
Monocytes Absolute: 1.2 10*3/uL — ABNORMAL HIGH (ref 0.1–0.9)
Neutrophils Absolute: 10.4 10*3/uL — ABNORMAL HIGH (ref 1.4–7.0)
Neutrophils: 76 %
PLATELETS: 338 10*3/uL (ref 150–379)
RBC: 4.7 x10E6/uL (ref 3.77–5.28)
RDW: 14.2 % (ref 12.3–15.4)
WBC: 13.9 10*3/uL — AB (ref 3.4–10.8)

## 2016-06-29 NOTE — Telephone Encounter (Signed)
-----   Message from Karle Plumber, [place] sent at 06/29/2016  1:53 PM EDT ----- Patient aware of lab results and verbalizes understanding. Patient states that she is scared to take the Cipro due to the side effects on the bottle. Asked patient what were the side effects that it said and patient did not remember and was not around her medication to read them to me. States she just did not want to take something that had severe side effects. Is wanting to know if we can call in a different rx. Please advise.

## 2016-06-29 NOTE — Telephone Encounter (Signed)
I think benefits outwegh risk in this case.   Ok with Bactrim 800/160 1 pill BID 10 days if she would not take cipro.   Laroy Apple, MD [facility] 06/29/2016, 2:12 PM

## 2016-06-29 NOTE — Telephone Encounter (Signed)
FYI- Patient agreed to take the Cipro.

## 2016-07-05 ENCOUNTER — Telehealth: Payer: Self-pay | Admitting: Family Medicine

## 2016-07-05 MED ORDER — CIPROFLOXACIN HCL 500 MG PO TABS: 500.0000 mg | ORAL_TABLET | Freq: Two times a day (BID) | ORAL | 0 refills | Status: DC

## 2016-07-05 MED ORDER — METRONIDAZOLE 500 MG PO TABS: 500.0000 mg | ORAL_TABLET | Freq: Three times a day (TID) | ORAL | 0 refills | Status: DC

## 2016-07-05 NOTE — Telephone Encounter (Signed)
Left voicemail for patient to return call.  Medications prescribed were cipro and flagyl - which she does not need to continue unless she is still having the abdominal pain.  She should let us know if she is still having the abdominal pain.

## 2016-07-05 NOTE — Telephone Encounter (Signed)
Patient states that she is still having diarrhea and some abdominal pain and wants to know if she should take another round of antibiotics. Today is her last day of antibiotics

## 2016-07-05 NOTE — Telephone Encounter (Signed)
Patient aware and verbalizes understanding,  

## 2016-07-05 NOTE — Telephone Encounter (Signed)
Please refill, if not better by Monday please call back or be seen.

## 2016-07-12 ENCOUNTER — Other Ambulatory Visit: Payer: Federal, State, Local not specified - PPO

## 2016-07-12 ENCOUNTER — Encounter (INDEPENDENT_AMBULATORY_CARE_PROVIDER_SITE_OTHER): Payer: Federal, State, Local not specified - PPO | Admitting: Ophthalmology

## 2016-07-16 ENCOUNTER — Other Ambulatory Visit: Payer: Self-pay | Admitting: Family Medicine

## 2016-07-20 ENCOUNTER — Other Ambulatory Visit: Payer: Federal, State, Local not specified - PPO

## 2016-07-20 DIAGNOSIS — E782 Mixed hyperlipidemia: Secondary | ICD-10-CM

## 2016-07-20 DIAGNOSIS — E039 Hypothyroidism, unspecified: Secondary | ICD-10-CM

## 2016-07-20 DIAGNOSIS — I10 Essential (primary) hypertension: Secondary | ICD-10-CM

## 2016-07-21 ENCOUNTER — Other Ambulatory Visit: Payer: Self-pay | Admitting: Family Medicine

## 2016-07-21 DIAGNOSIS — E039 Hypothyroidism, unspecified: Secondary | ICD-10-CM

## 2016-07-21 LAB — CBC WITH DIFFERENTIAL/PLATELET
BASOS ABS: 0 10*3/uL (ref 0.0–0.2)
Basos: 1 %
EOS (ABSOLUTE): 0.1 10*3/uL (ref 0.0–0.4)
Eos: 3 %
Hematocrit: 39.8 % (ref 34.0–46.6)
Hemoglobin: 13.1 g/dL (ref 11.1–15.9)
Immature Grans (Abs): 0 10*3/uL (ref 0.0–0.1)
Immature Granulocytes: 0 %
LYMPHS ABS: 1.6 10*3/uL (ref 0.7–3.1)
Lymphs: 30 %
MCH: 28.7 pg (ref 26.6–33.0)
MCHC: 32.9 g/dL (ref 31.5–35.7)
MCV: 87 fL (ref 79–97)
Monocytes Absolute: 0.5 10*3/uL (ref 0.1–0.9)
Monocytes: 9 %
NEUTROS ABS: 3.1 10*3/uL (ref 1.4–7.0)
Neutrophils: 57 %
PLATELETS: 343 10*3/uL (ref 150–379)
RBC: 4.56 x10E6/uL (ref 3.77–5.28)
RDW: 14.3 % (ref 12.3–15.4)
WBC: 5.3 10*3/uL (ref 3.4–10.8)

## 2016-07-21 LAB — LIPID PANEL
CHOL/HDL RATIO: 4 ratio (ref 0.0–4.4)
CHOLESTEROL TOTAL: 218 mg/dL — AB (ref 100–199)
HDL: 54 mg/dL (ref >39)
LDL Calculated: 130 mg/dL — ABNORMAL HIGH (ref 0–99)
Triglycerides: 172 mg/dL — ABNORMAL HIGH (ref 0–149)
VLDL CHOLESTEROL CAL: 34 mg/dL (ref 5–40)

## 2016-07-21 LAB — CMP14+EGFR
ALBUMIN: 4.4 g/dL (ref 3.5–4.7)
ALK PHOS: 57 IU/L (ref 39–117)
ALT: 18 IU/L (ref 0–32)
AST: 21 IU/L (ref 0–40)
Albumin/Globulin Ratio: 1.6 (ref 1.2–2.2)
BILIRUBIN TOTAL: 0.4 mg/dL (ref 0.0–1.2)
BUN / CREAT RATIO: 26 (ref 12–28)
BUN: 17 mg/dL (ref 8–27)
CHLORIDE: 102 mmol/L (ref 96–106)
CO2: 25 mmol/L (ref 18–29)
Calcium: 9.5 mg/dL (ref 8.7–10.3)
Creatinine, Ser: 0.65 mg/dL (ref 0.57–1.00)
GFR calc Af Amer: 93 mL/min/{1.73_m2} (ref >59)
GFR calc non Af Amer: 81 mL/min/{1.73_m2} (ref >59)
Globulin, Total: 2.8 g/dL (ref 1.5–4.5)
Glucose: 105 mg/dL — ABNORMAL HIGH (ref 65–99)
Potassium: 4.4 mmol/L (ref 3.5–5.2)
Sodium: 143 mmol/L (ref 134–144)
Total Protein: 7.2 g/dL (ref 6.0–8.5)

## 2016-07-21 LAB — TSH: TSH: 0.111 u[IU]/mL — AB (ref 0.450–4.500)

## 2016-07-21 MED ORDER — LEVOTHYROXINE SODIUM 75 MCG PO TABS: 75.0000 ug | ORAL_TABLET | Freq: Every day | ORAL | 1 refills | Status: DC

## 2016-07-22 LAB — HGB A1C W/O EAG: HEMOGLOBIN A1C: 5.8 % — AB (ref 4.8–5.6)

## 2016-07-22 LAB — SPECIMEN STATUS REPORT

## 2016-07-24 ENCOUNTER — Telehealth: Payer: Self-pay | Admitting: Family Medicine

## 2016-07-24 NOTE — Telephone Encounter (Signed)
Pt aware of results 

## 2016-07-26 ENCOUNTER — Encounter (INDEPENDENT_AMBULATORY_CARE_PROVIDER_SITE_OTHER): Payer: Federal, State, Local not specified - PPO | Admitting: Ophthalmology

## 2016-07-26 DIAGNOSIS — H43813 Vitreous degeneration, bilateral: Secondary | ICD-10-CM | POA: Diagnosis not present

## 2016-07-26 DIAGNOSIS — H35033 Hypertensive retinopathy, bilateral: Secondary | ICD-10-CM

## 2016-07-26 DIAGNOSIS — H353231 Exudative age-related macular degeneration, bilateral, with active choroidal neovascularization: Secondary | ICD-10-CM

## 2016-07-26 DIAGNOSIS — I10 Essential (primary) hypertension: Secondary | ICD-10-CM | POA: Diagnosis not present

## 2016-09-01 ENCOUNTER — Other Ambulatory Visit: Payer: Self-pay | Admitting: Family Medicine

## 2016-09-14 ENCOUNTER — Encounter (INDEPENDENT_AMBULATORY_CARE_PROVIDER_SITE_OTHER): Payer: Federal, State, Local not specified - PPO | Admitting: Ophthalmology

## 2016-09-14 DIAGNOSIS — H43813 Vitreous degeneration, bilateral: Secondary | ICD-10-CM | POA: Diagnosis not present

## 2016-09-14 DIAGNOSIS — H353231 Exudative age-related macular degeneration, bilateral, with active choroidal neovascularization: Secondary | ICD-10-CM

## 2016-09-14 DIAGNOSIS — I10 Essential (primary) hypertension: Secondary | ICD-10-CM

## 2016-09-14 DIAGNOSIS — H35033 Hypertensive retinopathy, bilateral: Secondary | ICD-10-CM | POA: Diagnosis not present

## 2016-09-26 IMAGING — CR DG CHEST 2V
1 series · 1 of 1 positions shown · non-contrast
Comparison: 10/12/2013.

CLINICAL DATA: Initial encounter. Chills. Headache. Body aches for
1 day.

EXAM:
CHEST  2 VIEW

[w chest lat]
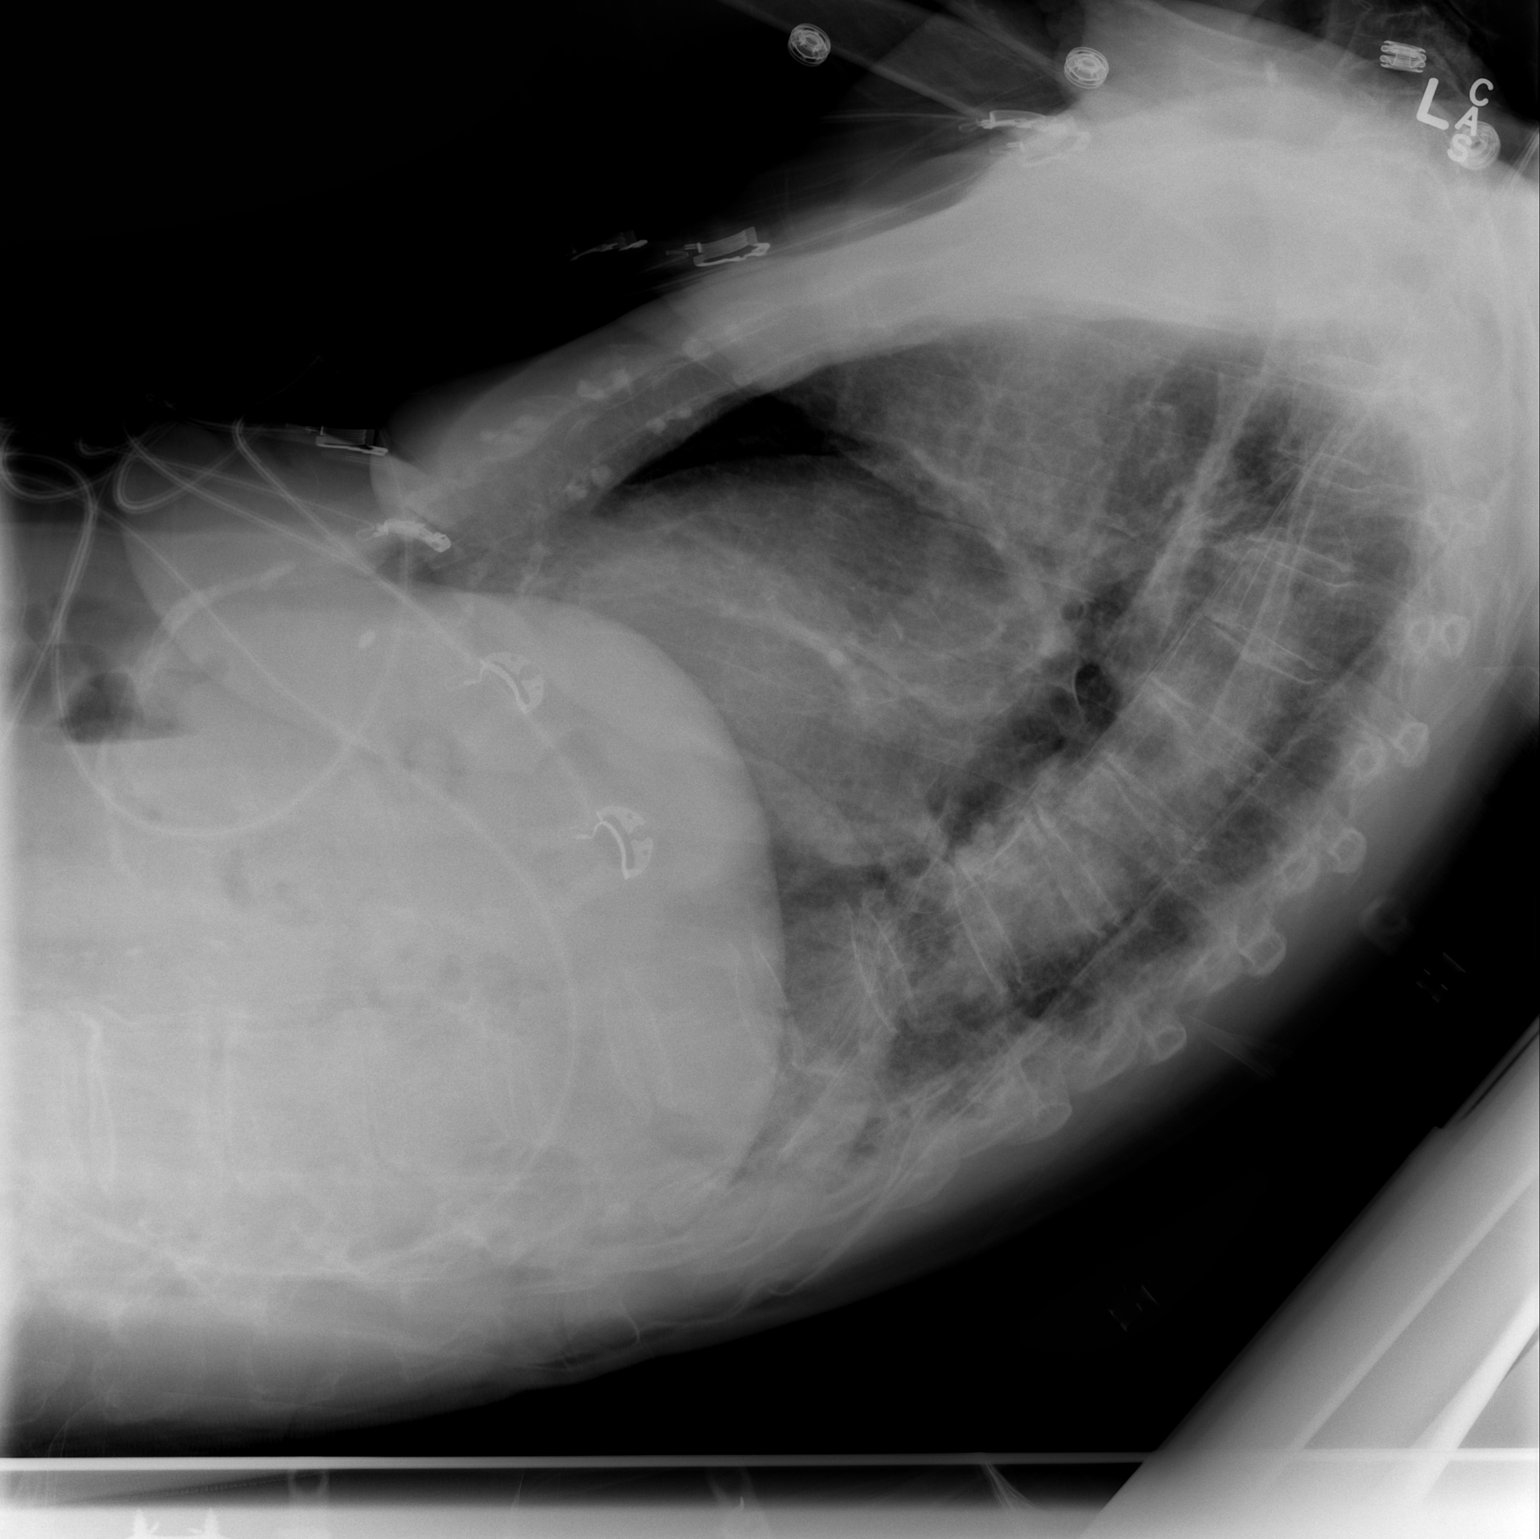

[1 of 1 positions shown; findings below may reference images not displayed]

FINDINGS: Cardiopericardial silhouette within normal limits. Mediastinal
contours normal. Trachea midline. No airspace disease or effusion.
Monitoring leads project over the chest. Aortic arch
atherosclerosis. Mild basilar atelectasis. Lung volumes are lower
than on the prior examination.
IMPRESSION: Suboptimal inspiration with basilar atelectasis.

## 2016-09-26 IMAGING — CT CT HEAD W/O CM
1 series · 15 of 29 positions shown, 19 images · non-contrast
Comparison: Head CT 01/03/2014.

CLINICAL DATA: 85-year-old female with headache, chills and
vomiting since yesterday afternoon.

EXAM:
CT HEAD WITHOUT CONTRAST
TECHNIQUE: Contiguous axial images were obtained from the base of the skull
through the vertex without intravenous contrast.

[Series 2: head 5.0 h30s · axial · 0.42mm/px · z∈[-144,-14]mm · 15 of 29 slices shown, 19 images]
[im 2/29  brain]
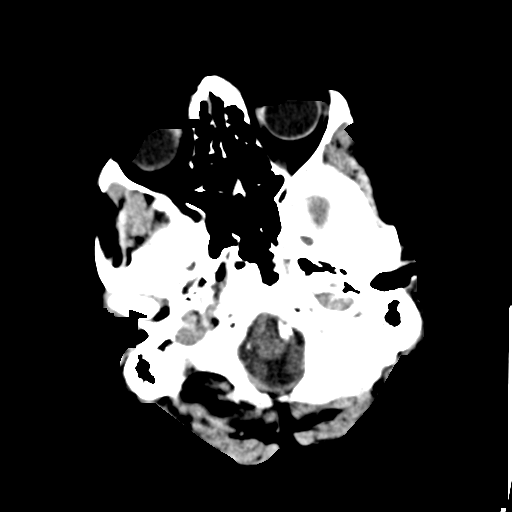
[im 2/29  bone]
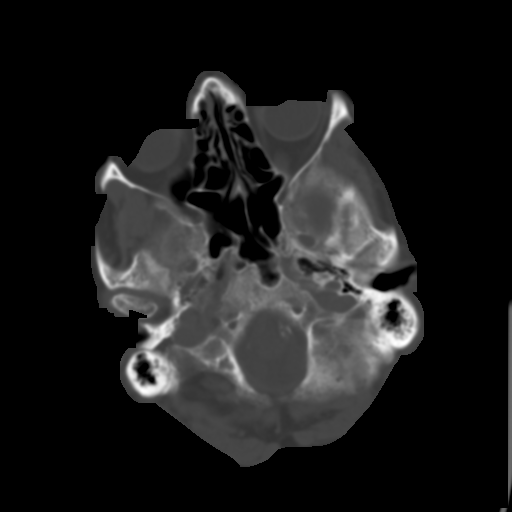
[im 4/29  brain]
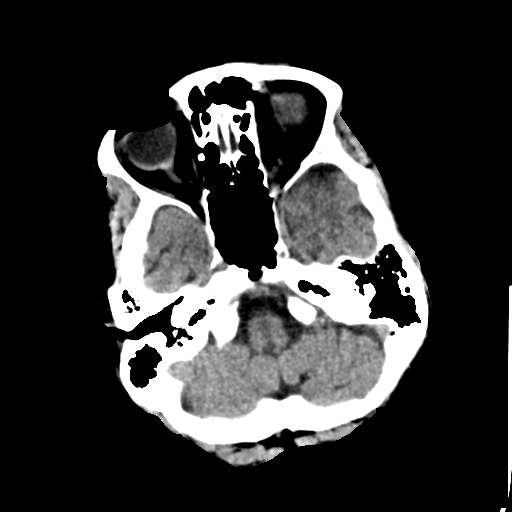
[im 6/29  brain]
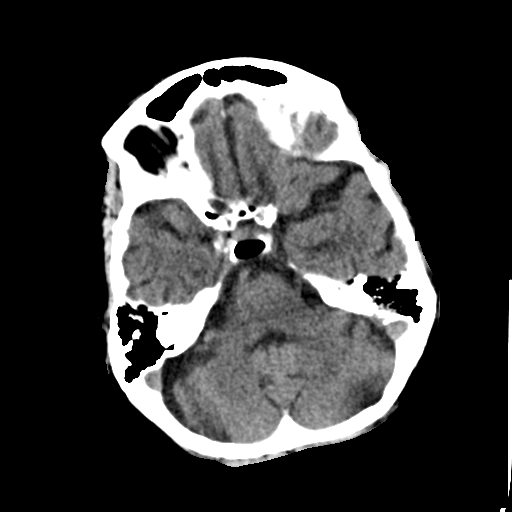
[im 8/29  brain]
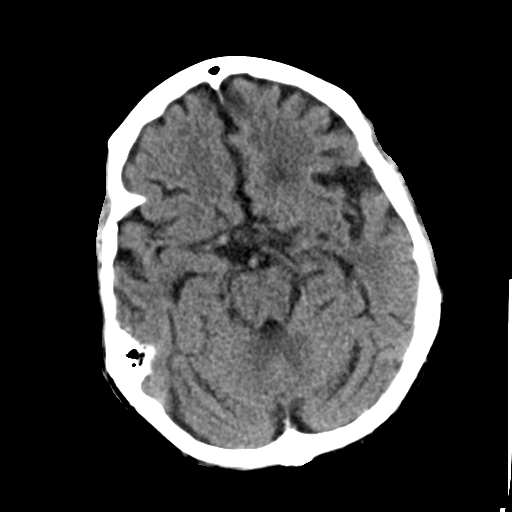
[im 10/29  brain]
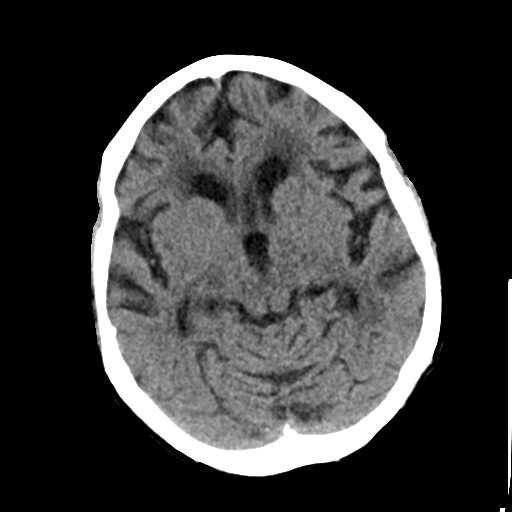
[im 10/29  bone]
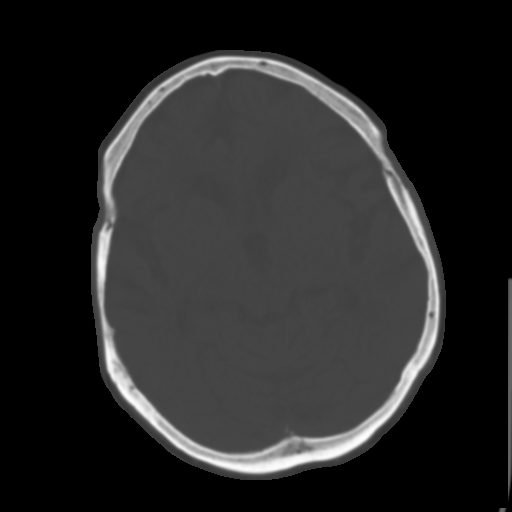
[im 11/29  brain]
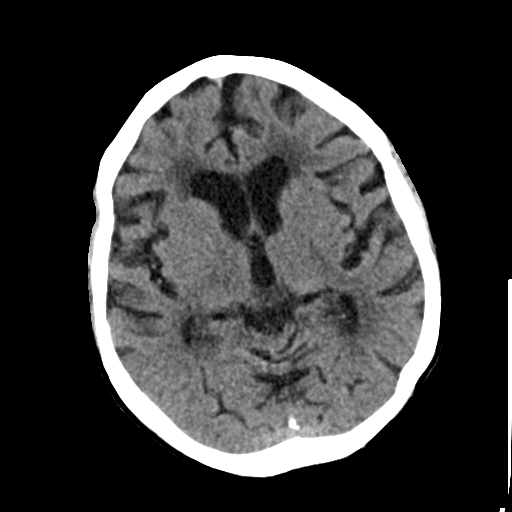
[im 13/29  brain]
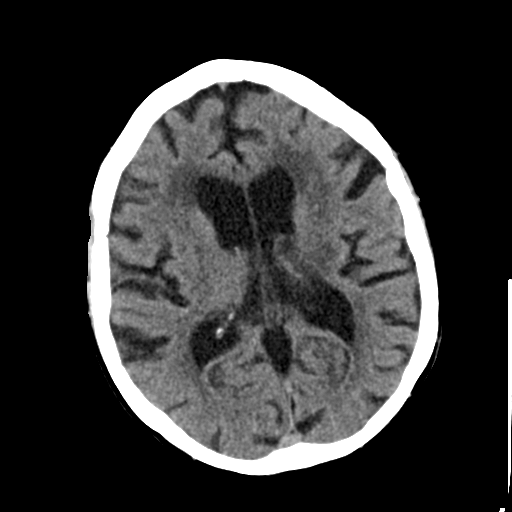
[im 15/29  brain]
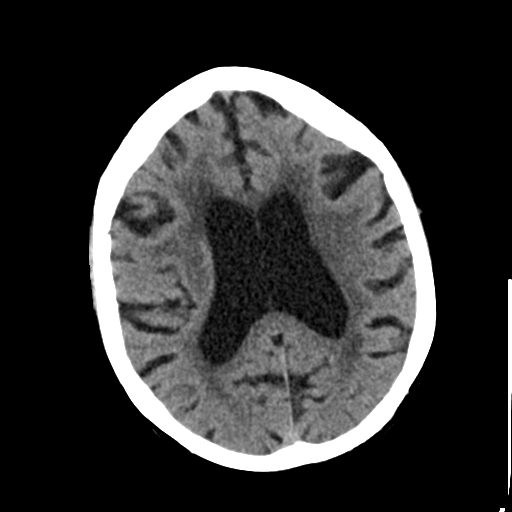
[im 17/29  brain]
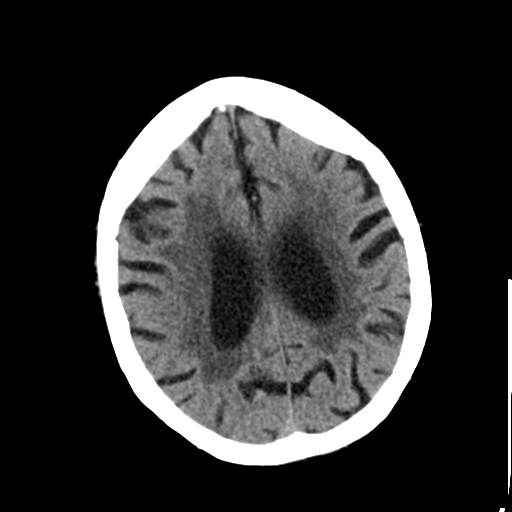
[im 17/29  bone]
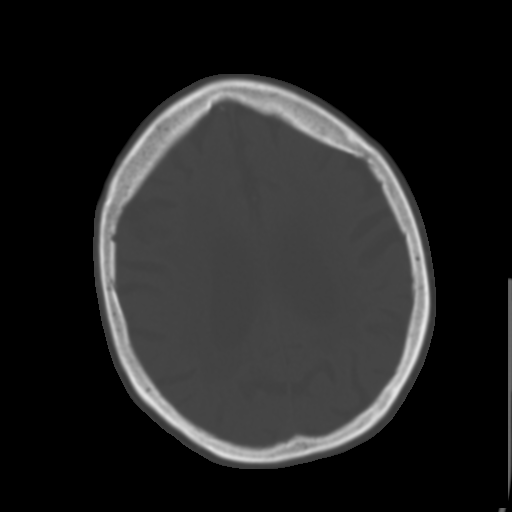
[im 19/29  brain]
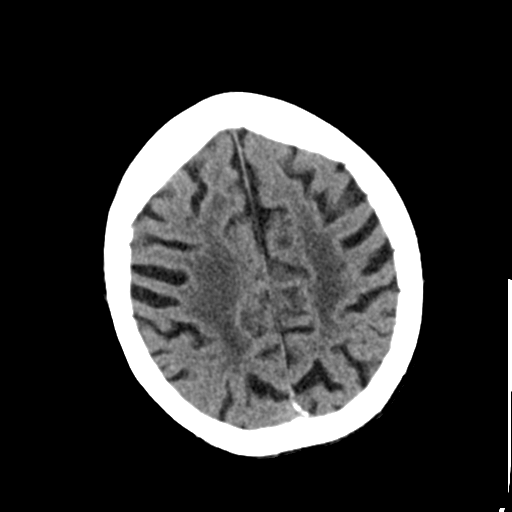
[im 20/29  brain]
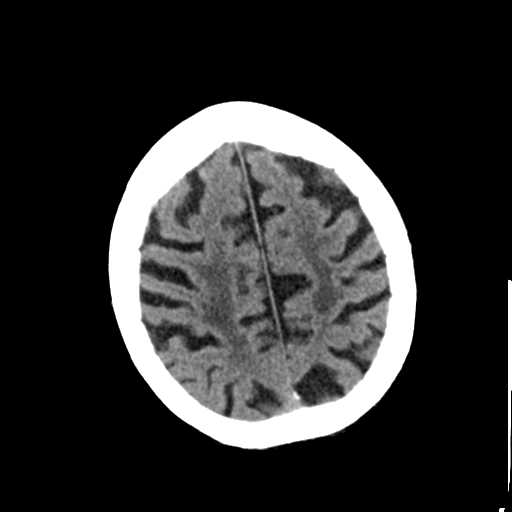
[im 22/29  brain]
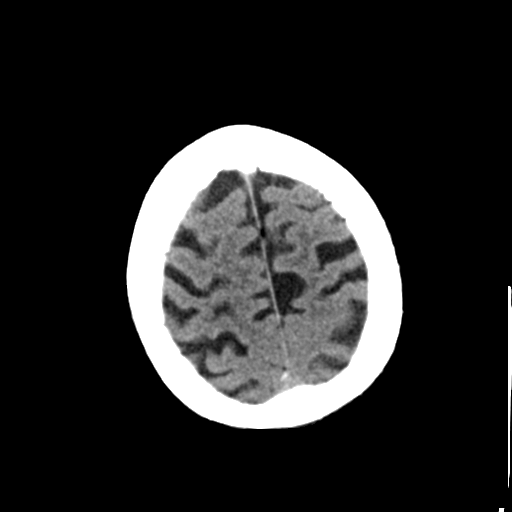
[im 24/29  brain]
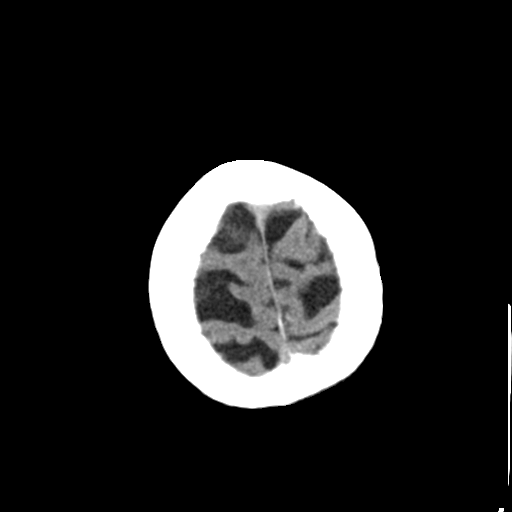
[im 24/29  bone]
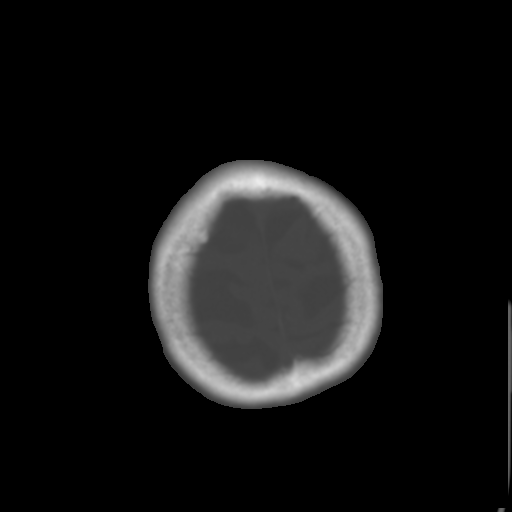
[im 26/29  brain]
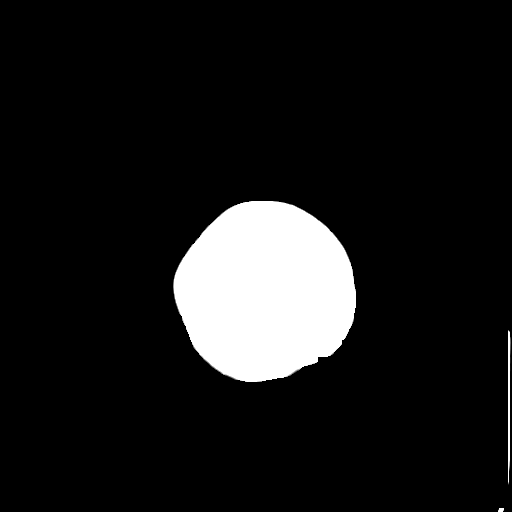
[im 28/29  brain]
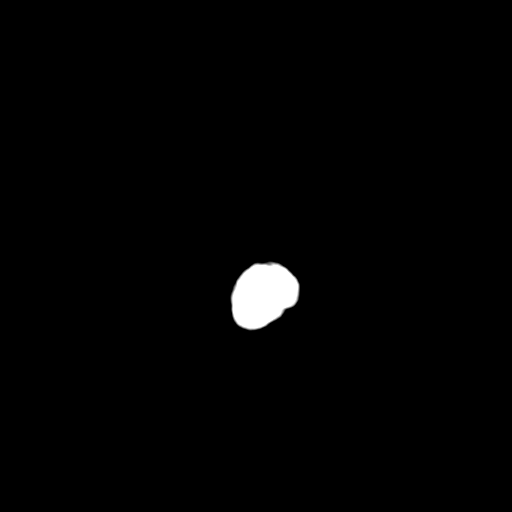

[15 of 29 positions shown; findings below may reference images not displayed]

FINDINGS: Mild cerebral and cerebellar atrophy. Patchy and confluent areas of
decreased attenuation are noted throughout the deep and
periventricular white matter of the cerebral hemispheres
bilaterally, compatible with chronic microvascular ischemic disease.
No acute intracranial abnormalities. Specifically, no evidence of
acute intracranial hemorrhage, no definite findings of
acute/subacute cerebral ischemia, no mass, mass effect,
hydrocephalus or abnormal intra or extra-axial fluid collections.
Visualized paranasal sinuses and mastoids are well pneumatized. No
acute displaced skull fractures are identified.
IMPRESSION: 1. No acute intracranial abnormalities.
2. Mild cerebral and cerebellar atrophy with extensive chronic
microvascular ischemic changes in the cerebral white matter.

## 2016-11-02 ENCOUNTER — Encounter (INDEPENDENT_AMBULATORY_CARE_PROVIDER_SITE_OTHER): Payer: Federal, State, Local not specified - PPO | Admitting: Ophthalmology

## 2016-11-02 DIAGNOSIS — H353231 Exudative age-related macular degeneration, bilateral, with active choroidal neovascularization: Secondary | ICD-10-CM | POA: Diagnosis not present

## 2016-11-02 DIAGNOSIS — H35033 Hypertensive retinopathy, bilateral: Secondary | ICD-10-CM | POA: Diagnosis not present

## 2016-11-02 DIAGNOSIS — H43813 Vitreous degeneration, bilateral: Secondary | ICD-10-CM

## 2016-11-02 DIAGNOSIS — I10 Essential (primary) hypertension: Secondary | ICD-10-CM | POA: Diagnosis not present

## 2016-11-04 ENCOUNTER — Other Ambulatory Visit: Payer: Self-pay | Admitting: Family Medicine

## 2016-11-30 ENCOUNTER — Other Ambulatory Visit: Payer: Self-pay | Admitting: Family Medicine

## 2016-12-12 ENCOUNTER — Ambulatory Visit: Payer: Federal, State, Local not specified - PPO | Admitting: Family Medicine

## 2016-12-14 ENCOUNTER — Ambulatory Visit: Payer: Federal, State, Local not specified - PPO | Admitting: Family Medicine

## 2016-12-19 ENCOUNTER — Encounter: Payer: Self-pay | Admitting: Family Medicine

## 2016-12-19 ENCOUNTER — Ambulatory Visit (INDEPENDENT_AMBULATORY_CARE_PROVIDER_SITE_OTHER): Payer: Federal, State, Local not specified - PPO | Admitting: Family Medicine

## 2016-12-19 VITALS — BP 154/66 | HR 69 | Temp 97.6°F | Ht 63.0 in | Wt 126.4 lb

## 2016-12-19 DIAGNOSIS — R7303 Prediabetes: Secondary | ICD-10-CM | POA: Insufficient documentation

## 2016-12-19 DIAGNOSIS — R21 Rash and other nonspecific skin eruption: Secondary | ICD-10-CM

## 2016-12-19 DIAGNOSIS — L609 Nail disorder, unspecified: Secondary | ICD-10-CM

## 2016-12-19 DIAGNOSIS — I10 Essential (primary) hypertension: Secondary | ICD-10-CM

## 2016-12-19 DIAGNOSIS — Z23 Encounter for immunization: Secondary | ICD-10-CM | POA: Diagnosis not present

## 2016-12-19 LAB — BAYER DCA HB A1C WAIVED: HB A1C: 5.6 % (ref <7.0)

## 2016-12-19 MED ORDER — TRIAMCINOLONE ACETONIDE 0.1 % EX CREA: 1.0000 "application " | TOPICAL_CREAM | Freq: Two times a day (BID) | CUTANEOUS | 0 refills | Status: DC

## 2016-12-19 NOTE — Patient Instructions (Signed)
Great to see you!  Come back in 6 months unless you need us sooner.    

## 2016-12-19 NOTE — Progress Notes (Signed)
   HPI  Patient presents today here for follow-up of chronic medical conditions as well as influenza vaccine.  Prediabetes Patient not really watching diet, however reports prediabetes for about 20 years. She is active, but struggling with some musculoskeletal pain from a fall back in 2014 causing fractures that were repaired surgically by Dr. Case in [place] Good med compliance, no CP or HA   Fingernail splitting of L 5th digit, a few months.   R ear lesion, non healing area on R ear, has tried hydrocortisone intermittently with minimal relief.   PMH: Smoking status noted ROS: Per HPI  Objective: BP (!) 154/66   Pulse 69   Temp 97.6 F (36.4 C) (Oral)   Ht 5\' 3"  (1.6 m)   Wt 126 lb 6.4 oz (57.3 kg)   BMI 22.39 kg/m  Gen: NAD, alert, cooperative with exam HEENT: NCAT CV: RRR, good S1/S2, no murmur Resp: CTABL, no wheezes, non-labored Ext: No edema, warm Neuro: Alert and oriented, No gross deficits Skin:  L 5th digit with small ( 2-3 mm ) split in nail not extending into the pulp of the nail R ear with approx 3 mm X 1 cm area of mild erythema and scale.    Assessment and plan:  # Prediabetes Discussed, patient not well invested, however disease state is also mild to moderate. A1c today. Discussed therapeutic lifestyle changes  # Quillian Quince abnormality Left fifth digit, very mild, reassurance provided, no clear etiology Consider iron deficiency anemia, however this was stable last lab check  # Essential hypertension Well-controlled, no changes Labs are up-to-date  # Rash Right ear, likely eczema Triamcinolone cream  Need for vaccination against influenza Counseling provided for all vaccine components  Orders Placed This Encounter  Procedures  . Flu Vaccine QUAD 36+ mos IM  . Bayer DCA Hb A1c Waived    Meds ordered this encounter  Medications  . triamcinolone cream (KENALOG) 0.1 %    Sig: Apply 1 application topically 2 (two) times daily.    Dispense:   30 g    Refill:  [place], MD [facility] 12/19/2016, 11:55 AM

## 2016-12-21 ENCOUNTER — Encounter (INDEPENDENT_AMBULATORY_CARE_PROVIDER_SITE_OTHER): Payer: Federal, State, Local not specified - PPO | Admitting: Ophthalmology

## 2016-12-21 DIAGNOSIS — H35033 Hypertensive retinopathy, bilateral: Secondary | ICD-10-CM | POA: Diagnosis not present

## 2016-12-21 DIAGNOSIS — H43813 Vitreous degeneration, bilateral: Secondary | ICD-10-CM

## 2016-12-21 DIAGNOSIS — H353231 Exudative age-related macular degeneration, bilateral, with active choroidal neovascularization: Secondary | ICD-10-CM | POA: Diagnosis not present

## 2016-12-21 DIAGNOSIS — I10 Essential (primary) hypertension: Secondary | ICD-10-CM

## 2016-12-26 ENCOUNTER — Ambulatory Visit: Payer: Federal, State, Local not specified - PPO | Admitting: *Deleted

## 2017-01-05 ENCOUNTER — Other Ambulatory Visit: Payer: Self-pay | Admitting: Family Medicine

## 2017-01-28 ENCOUNTER — Other Ambulatory Visit: Payer: Self-pay | Admitting: Family Medicine

## 2017-02-08 ENCOUNTER — Encounter (INDEPENDENT_AMBULATORY_CARE_PROVIDER_SITE_OTHER): Payer: Federal, State, Local not specified - PPO | Admitting: Ophthalmology

## 2017-02-15 ENCOUNTER — Encounter (INDEPENDENT_AMBULATORY_CARE_PROVIDER_SITE_OTHER): Payer: Federal, State, Local not specified - PPO | Admitting: Ophthalmology

## 2017-02-15 DIAGNOSIS — I10 Essential (primary) hypertension: Secondary | ICD-10-CM

## 2017-02-15 DIAGNOSIS — H43813 Vitreous degeneration, bilateral: Secondary | ICD-10-CM

## 2017-02-15 DIAGNOSIS — H353231 Exudative age-related macular degeneration, bilateral, with active choroidal neovascularization: Secondary | ICD-10-CM | POA: Diagnosis not present

## 2017-02-15 DIAGNOSIS — H35033 Hypertensive retinopathy, bilateral: Secondary | ICD-10-CM

## 2017-02-27 ENCOUNTER — Other Ambulatory Visit: Payer: Self-pay | Admitting: Family Medicine

## 2017-03-01 ENCOUNTER — Other Ambulatory Visit: Payer: Self-pay | Admitting: *Deleted

## 2017-03-27 ENCOUNTER — Encounter (INDEPENDENT_AMBULATORY_CARE_PROVIDER_SITE_OTHER): Payer: Federal, State, Local not specified - PPO | Admitting: Ophthalmology

## 2017-03-27 DIAGNOSIS — H43813 Vitreous degeneration, bilateral: Secondary | ICD-10-CM | POA: Diagnosis not present

## 2017-03-27 DIAGNOSIS — I10 Essential (primary) hypertension: Secondary | ICD-10-CM

## 2017-03-27 DIAGNOSIS — H35033 Hypertensive retinopathy, bilateral: Secondary | ICD-10-CM

## 2017-03-27 DIAGNOSIS — H353231 Exudative age-related macular degeneration, bilateral, with active choroidal neovascularization: Secondary | ICD-10-CM

## 2017-03-29 ENCOUNTER — Encounter (INDEPENDENT_AMBULATORY_CARE_PROVIDER_SITE_OTHER): Payer: Federal, State, Local not specified - PPO | Admitting: Ophthalmology

## 2017-04-23 ENCOUNTER — Other Ambulatory Visit: Payer: Self-pay

## 2017-04-23 MED ORDER — BENAZEPRIL HCL 20 MG PO TABS: 20.0000 mg | ORAL_TABLET | Freq: Every day | ORAL | 0 refills | Status: DC

## 2017-04-23 MED ORDER — POLYSACCHARIDE IRON COMPLEX 150 MG PO CAPS: ORAL_CAPSULE | ORAL | 0 refills | Status: DC

## 2017-04-26 ENCOUNTER — Encounter: Payer: Self-pay | Admitting: Physical Therapy

## 2017-04-26 ENCOUNTER — Ambulatory Visit: Payer: Federal, State, Local not specified - PPO | Attending: Orthopedic Surgery | Admitting: Physical Therapy

## 2017-04-26 DIAGNOSIS — R2681 Unsteadiness on feet: Secondary | ICD-10-CM | POA: Diagnosis present

## 2017-04-26 DIAGNOSIS — M6281 Muscle weakness (generalized): Secondary | ICD-10-CM | POA: Insufficient documentation

## 2017-04-26 NOTE — Therapy (Signed)
[facility] [place], [place], [address] Phone: [phone]   Fax:  [phone]  Physical Therapy Evaluation  Patient Details  Name: Jenna Ramos MRN: [id number] Date of Birth: 05-28-1928 Referring Provider: Remo Lipps Case MD   Encounter Date: 04/26/2017  PT End of Session - 04/26/17 1332    Visit Number  1    Number of Visits  16    Date for PT Re-Evaluation  07/25/17    PT Start Time  8416    PT Stop Time  1325    PT Time Calculation (min)  26 min       Past Medical History:  Diagnosis Date  . Anemia    takes Iron pill daily  . Arthritis   . Carotid artery occlusion   . Fibromyalgia   . Fibromyalgia   . Glaucoma    uses Eye Drops daily  . HOH (hard of hearing)   . Hyperlipidemia    takes Atorvastatin daily  . Hypertension    takes Benazepril daily  . Hypothyroidism    takes Synthroid daily  . Macular degeneration    wet and gets injections in both eyes  . Nocturia   . Peripheral vascular disease ([place])   . Rectal incontinence   . Stroke [facility])     Past Surgical History:  Procedure Laterality Date  . APPENDECTOMY    . CAROTID ENDARTERECTOMY    . cataract surgery Bilateral   . COLONOSCOPY    . ENDARTERECTOMY Left 02/19/2014   Procedure: ENDARTERECTOMY CAROTID;  Surgeon: Conrad Stratford, MD;  Location: [place];  Service: Vascular;  Laterality: Left;  . ESOPHAGOGASTRODUODENOSCOPY    . TONSILLECTOMY    . TOTAL HIP ARTHROPLASTY Left 10/2013  . TUBAL LIGATION    . WRIST SURGERY     pins and screws    There were no vitals filed for this visit.   Subjective Assessment - 04/26/17 1338    Subjective  The patient reports a feeling of weakness and unsteadiness on her feet and has had one fall over the last 6 months.  Spring is approaching and she wants to get outside and do more.  It is recommended she use a cane but she was without an assisitve device today.    Pertinent History  Left total hip  replacement.    Limitations  Walking    How long can you walk comfortably?  Short distance but I feel I stagger.    Patient Stated Goals  Get outside and do more yardwork.         [facility] PT Assessment - 04/26/17 0001      Assessment   Medical Diagnosis  Poor balance.    Referring Provider  Remo Lipps Case MD    Onset Date/Surgical Date  -- 2014.      Precautions   Precautions  Fall      Restrictions   Weight Bearing Restrictions  No      Balance Screen   Has the patient fallen in the past 6 months  Yes    How many times?  -- 1.    Has the patient had a decrease in activity level because of a fear of falling?   Yes    Is the patient reluctant to leave their home because of a fear of falling?   No      Home Environment   Living Environment  Private residence    Additional Comments  Stairs into house with  railing.      Prior Function   Level of Independence  Independent      Posture/Postural Control   Posture Comments  Generally ggod posture with some mild left scapular winging.      ROM / Strength   AROM / PROM / Strength  AROM;Strength      AROM   Overall AROM Comments  LE AROM is WNL.        Strength   Overall Strength Comments  Right hip flexion and abduction=4 to 4+/5; Left hip flexion and abduction= 4-/5.  Bilateral knee and ankle strength= 5/5.      Special Tests    Special Tests  -- (-) Romberg test.      Bed Mobility   Bed Mobility  Rolling Right    Rolling Right  6: Modified independent (Device/Increase time)      Ambulation/Gait   Gait Pattern  Decreased step length - right;Decreased step length - left;Decreased stride length;Ataxic      Standardized Balance Assessment   Standardized Balance Assessment  Berg Balance Test      Berg Balance Test   Sit to Stand  Able to stand  independently using hands    Standing Unsupported  Able to stand 2 minutes with supervision    Sitting with Back Unsupported but Feet Supported on Floor or Stool  Able to sit safely  and securely 2 minutes    Stand to Sit  Sits safely with minimal use of hands    Transfers  Able to transfer safely, minor use of hands    Standing Unsupported with Eyes Closed  Able to stand 10 seconds with supervision    Standing Ubsupported with Feet Together  Able to place feet together independently and stand for 1 minute with supervision    From Standing, Reach Forward with Outstretched Arm  Can reach confidently >25 cm (10")    From Standing Position, Pick up Object from Floor  Able to pick up shoe, needs supervision    From Standing Position, Turn to Look Behind Over each Shoulder  Looks behind from both sides and weight shifts well    Turn 360 Degrees  Able to turn 360 degrees safely in 4 seconds or less    Standing Unsupported, Alternately Place Feet on Step/Stool  Able to complete 4 steps without aid or supervision    Standing Unsupported, One Foot in Front  Able to plae foot ahead of the other independently and hold 30 seconds    Standing on One Leg  Tries to lift leg/unable to hold 3 seconds but remains standing independently    Total Score  45             Objective measurements completed on examination: See above findings.                PT Short Term Goals - 04/26/17 1407      PT SHORT TERM GOAL #1   Title  Independent with an intial HEP.    Time  2    Period  Weeks    Status  New      PT SHORT TERM GOAL #2   Title  Improve Berg score to 48/56.    Time  2    Period  Weeks    Status  New        PT Long Term Goals - 04/26/17 1408      PT LONG TERM GOAL #1   Title  Independent with  an advanced HEP.    Time  8    Period  Weeks    Status  New      PT LONG TERM GOAL #2   Title  Improve Berg score to 51-52/56.    Time  8    Period  Weeks    Status  New      PT LONG TERM GOAL #3   Title  Improve bilateral hip strength to 4+ to 5/5 to increase stability for walking and functional activites.    Time  8    Period  Weeks    Status  New              Plan - 04/26/17 1403    Clinical Impression Statement  The patient presents to OPPT with unsteadiness of gait.  Bilateral hip are weak left > right.  Her Berg score= 45/56.  Her deficits have impaired her functional mobility and decreased her confidence in walking outdoors.  The patient will benefit from skilled physical therapy intervention to include strengthening exercises and balance training activites.    History and Personal Factors relevant to plan of care:  Left total hip replacement.  Arhritis and fibromyalgia.    Clinical Presentation  Evolving    Clinical Decision Making  Moderate    Rehab Potential  Excellent    PT Frequency  2x / week    PT Duration  8 weeks    PT Treatment/Interventions  ADLs/Self Care Home Management;Gait training;Stair training;Functional mobility training;Therapeutic activities;Therapeutic exercise;Balance training;Neuromuscular re-education;Patient/family education    PT Next Visit Plan  Core exercise; bilateral hip strengthening; balance and gait activites.    Consulted and Agree with Plan of Care  Patient       Patient will benefit from skilled therapeutic intervention in order to improve the following deficits and impairments:  Abnormal gait, Decreased activity tolerance, Decreased balance, Decreased strength  Visit Diagnosis: Muscle weakness (generalized) - Plan: PT plan of care cert/re-cert  Unsteadiness on feet - Plan: PT plan of care cert/re-cert     Problem List Patient Active Problem List   Diagnosis Date Noted  . Pre-diabetes 12/19/2016  . Hypothyroidism   . HLD (hyperlipidemia)   . Essential hypertension 05/04/2014  . Hip pain 03/31/2014  . TIA (transient ischemic attack) 01/23/2014  . Carotid artery disease ([place]) 01/14/2014  . 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 Fibromyalgia muscle pain 10/28/2013  . Acute blood loss anemia 10/28/2013  . Hip fracture requiring operative repair  ([place]) 10/17/2013  . Anemia     Didier Brandenburg, [place] MPT 04/26/2017, 2:14 PM  [facility] [address] [place], [place], [address] Phone: [phone]   Fax:  [phone]  Name: Jenna Ramos MRN: [id number] Date of Birth: [birth year]

## 2017-04-30 ENCOUNTER — Encounter: Payer: Self-pay | Admitting: Physical Therapy

## 2017-04-30 ENCOUNTER — Ambulatory Visit: Payer: Federal, State, Local not specified - PPO | Admitting: Physical Therapy

## 2017-04-30 DIAGNOSIS — M6281 Muscle weakness (generalized): Secondary | ICD-10-CM | POA: Diagnosis not present

## 2017-04-30 DIAGNOSIS — R2681 Unsteadiness on feet: Secondary | ICD-10-CM

## 2017-04-30 NOTE — Therapy (Signed)
[facility] [place], [place], [address] Phone: [phone]   Fax:  [phone]  Physical Therapy Treatment  Patient Details  Name: Jenna Ramos MRN: [id number] Date of Birth: 1928-09-25 Referring Provider: Remo Lipps Case MD   Encounter Date: 04/30/2017  PT End of Session - 04/30/17 1432    Visit Number  2    Number of Visits  16    Date for PT Re-Evaluation  07/25/17    PT Start Time  0143    PT Stop Time  0225    PT Time Calculation (min)  42 min    Activity Tolerance  Patient tolerated treatment well    Behavior During Therapy  [facility] for tasks assessed/performed       Past Medical History:  Diagnosis Date  . Anemia    takes Iron pill daily  . Arthritis   . Carotid artery occlusion   . Fibromyalgia   . Fibromyalgia   . Glaucoma    uses Eye Drops daily  . HOH (hard of hearing)   . Hyperlipidemia    takes Atorvastatin daily  . Hypertension    takes Benazepril daily  . Hypothyroidism    takes Synthroid daily  . Macular degeneration    wet and gets injections in both eyes  . Nocturia   . Peripheral vascular disease ([place])   . Rectal incontinence   . Stroke [facility])     Past Surgical History:  Procedure Laterality Date  . APPENDECTOMY    . CAROTID ENDARTERECTOMY    . cataract surgery Bilateral   . COLONOSCOPY    . ENDARTERECTOMY Left 02/19/2014   Procedure: ENDARTERECTOMY CAROTID;  Surgeon: Conrad Vance, MD;  Location: [place];  Service: Vascular;  Laterality: Left;  . ESOPHAGOGASTRODUODENOSCOPY    . TONSILLECTOMY    . TOTAL HIP ARTHROPLASTY Left 10/2013  . TUBAL LIGATION    . WRIST SURGERY     pins and screws    There were no vitals filed for this visit.  Subjective Assessment - 04/30/17 1348    Subjective  I'm a little sore from that workout and i was walking in the woods looking for [place].    How long can you walk comfortably?  Short distance but I feel I stagger.    Patient Stated Goals   Get outside and do more yardwork.                      [facility] Adult PT Treatment/Exercise - 04/30/17 0001      Exercises   Exercises  Knee/Hip      Knee/Hip Exercises: Aerobic   Nustep  Level 3 x 15 minutes.      Knee/Hip Exercises: Seated   Sit to Sand  10 reps;with UE support      Knee/Hip Exercises: Supine   Bridges Limitations  Hip bridges done slowly with excellent technique 2 x fatigue.    Other Supine Knee/Hip Exercises  Supine hip abduction resisted with red theraband to fatigue x 2 with slow and excellent reps and technique.    Other Supine Knee/Hip Exercises  Mini-crunches to fatigue x 2.      Knee/Hip Exercises: Sidelying   Hip ABduction Limitations  SDLY hip abduction:  Pillow between knees:  Assisted and performed to fatigue x 1 on left and to fatigue x 2 on right.          Balance Exercises - 04/30/17 1412  Balance Exercises: Standing   Other Standing Exercises  In parallel bars Rockerboard forward and back x 5 minutes with tactile cues required f/b Inverted BOSU x 4 minutes (9 minutes).          PT Short Term Goals - 04/26/17 1407      PT SHORT TERM GOAL #1   Title  Independent with an intial HEP.    Time  2    Period  Weeks    Status  New      PT SHORT TERM GOAL #2   Title  Improve Berg score to 48/56.    Time  2    Period  Weeks    Status  New        PT Long Term Goals - 04/26/17 1408      PT LONG TERM GOAL #1   Title  Independent with an advanced HEP.    Time  8    Period  Weeks    Status  New      PT LONG TERM GOAL #2   Title  Improve Berg score to 51-52/56.    Time  8    Period  Weeks    Status  New      PT LONG TERM GOAL #3   Title  Improve bilateral hip strength to 4+ to 5/5 to increase stability for walking and functional activites.    Time  8    Period  Weeks    Status  New            Plan - 04/30/17 1435    Clinical Impression Statement  The patient did an excellent job today.  Her gait is  ataxic and she need work on balance and ongoing LE and core strengthening.      PT Treatment/Interventions  ADLs/Self Care Home Management;Gait training;Stair training;Functional mobility training;Therapeutic activities;Therapeutic exercise;Balance training;Neuromuscular re-education;Patient/family education    PT Next Visit Plan  Core exercise; bilateral hip strengthening; balance and gait activites.    Consulted and Agree with Plan of Care  Patient       Patient will benefit from skilled therapeutic intervention in order to improve the following deficits and impairments:  Abnormal gait, Decreased activity tolerance, Decreased balance, Decreased strength  Visit Diagnosis: Muscle weakness (generalized)  Unsteadiness on feet     Problem List Patient Active Problem List   Diagnosis Date Noted  . Pre-diabetes 12/19/2016  . Hypothyroidism   . HLD (hyperlipidemia)   . Essential hypertension 05/04/2014  . Hip pain 03/31/2014  . TIA (transient ischemic attack) 01/23/2014  . Carotid artery disease ([place]) 01/14/2014  . 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 Fibromyalgia muscle pain 10/28/2013  . Acute blood loss anemia 10/28/2013  . Hip fracture requiring operative repair ([place]) 10/17/2013  . Anemia     Annlee Glandon, [place] MPT 04/30/2017, 2:38 PM  [facility] [address] [place], [place], [address] Phone: [phone]   Fax:  [phone]  Name: Jenna Ramos MRN: [id number] Date of Birth: 12-10-1928

## 2017-05-02 ENCOUNTER — Encounter (INDEPENDENT_AMBULATORY_CARE_PROVIDER_SITE_OTHER): Payer: Federal, State, Local not specified - PPO | Admitting: Ophthalmology

## 2017-05-02 ENCOUNTER — Ambulatory Visit: Payer: Federal, State, Local not specified - PPO | Admitting: Physical Therapy

## 2017-05-02 ENCOUNTER — Encounter: Payer: Self-pay | Admitting: Physical Therapy

## 2017-05-02 DIAGNOSIS — M6281 Muscle weakness (generalized): Secondary | ICD-10-CM

## 2017-05-02 DIAGNOSIS — R2681 Unsteadiness on feet: Secondary | ICD-10-CM

## 2017-05-02 NOTE — Therapy (Signed)
[facility] [place], [place], [address] Phone: [phone]   Fax:  [phone]  Physical Therapy Treatment  Patient Details  Name: Jenna Ramos MRN: [id number] Date of Birth: 1928-11-21 Referring Provider: Remo Lipps Case MD   Encounter Date: 05/02/2017  PT End of Session - 05/02/17 1352    Visit Number  3    Number of Visits  16    Date for PT Re-Evaluation  07/25/17    PT Start Time  0101    PT Stop Time  0147 Bathroom break and fatigue.    PT Time Calculation (min)  46 min       Past Medical History:  Diagnosis Date  . Anemia    takes Iron pill daily  . Arthritis   . Carotid artery occlusion   . Fibromyalgia   . Fibromyalgia   . Glaucoma    uses Eye Drops daily  . HOH (hard of hearing)   . Hyperlipidemia    takes Atorvastatin daily  . Hypertension    takes Benazepril daily  . Hypothyroidism    takes Synthroid daily  . Macular degeneration    wet and gets injections in both eyes  . Nocturia   . Peripheral vascular disease ([place])   . Rectal incontinence   . Stroke [facility])     Past Surgical History:  Procedure Laterality Date  . APPENDECTOMY    . CAROTID ENDARTERECTOMY    . cataract surgery Bilateral   . COLONOSCOPY    . ENDARTERECTOMY Left 02/19/2014   Procedure: ENDARTERECTOMY CAROTID;  Surgeon: Conrad [place], MD;  Location: [facility];  Service: Vascular;  Laterality: Left;  . ESOPHAGOGASTRODUODENOSCOPY    . TONSILLECTOMY    . TOTAL HIP ARTHROPLASTY Left 10/2013  . TUBAL LIGATION    . WRIST SURGERY     pins and screws    There were no vitals filed for this visit.  Subjective Assessment - 05/02/17 1353    Subjective  I did well after last treatment.  Probably won't do quite as much today.                      [facility] Adult PT Treatment/Exercise - 05/02/17 0001      Exercises   Exercises  Knee/Hip      Knee/Hip Exercises: Aerobic   Nustep  Level 4 x 15 minutes.      Knee/Hip  Exercises: Machines for Strengthening   Cybex Knee Extension  10# x 4 minutes.    Cybex Knee Flexion  20# x 4 minutes.          Balance Exercises - 05/02/17 1354      Balance Exercises: Standing   Other Standing Exercises  Green 4 way walking (2 minutes each way) f/b Rockerboard x 2 minutes.          PT Short Term Goals - 04/26/17 1407      PT SHORT TERM GOAL #1   Title  Independent with an intial HEP.    Time  2    Period  Weeks    Status  New      PT SHORT TERM GOAL #2   Title  Improve Berg score to 48/56.    Time  2    Period  Weeks    Status  New        PT Long Term Goals - 04/26/17 1408      PT LONG TERM GOAL #1  Title  Independent with an advanced HEP.    Time  8    Period  Weeks    Status  New      PT LONG TERM GOAL #2   Title  Improve Berg score to 51-52/56.    Time  8    Period  Weeks    Status  New      PT LONG TERM GOAL #3   Title  Improve bilateral hip strength to 4+ to 5/5 to increase stability for walking and functional activites.    Time  8    Period  Weeks    Status  New              Patient will benefit from skilled therapeutic intervention in order to improve the following deficits and impairments:     Visit Diagnosis: Muscle weakness (generalized)  Unsteadiness on feet     Problem List Patient Active Problem List   Diagnosis Date Noted  . Pre-diabetes 12/19/2016  . Hypothyroidism   . HLD (hyperlipidemia)   . Essential hypertension 05/04/2014  . Hip pain 03/31/2014  . TIA (transient ischemic attack) 01/23/2014  . Carotid artery disease ([place]) 01/14/2014  . 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 Fibromyalgia muscle pain 10/28/2013  . Acute blood loss anemia 10/28/2013  . Hip fracture requiring operative repair ([place]) 10/17/2013  . Anemia     Aiven Kampe, [place] MPT 05/02/2017, 2:13 PM  [facility] [place], [place],  [address] Phone: [phone]   Fax:  [phone]  Name: Jenna Ramos MRN: [id number] Date of Birth: March 01, 1929

## 2017-05-04 ENCOUNTER — Encounter (INDEPENDENT_AMBULATORY_CARE_PROVIDER_SITE_OTHER): Payer: Federal, State, Local not specified - PPO | Admitting: Ophthalmology

## 2017-05-04 DIAGNOSIS — H353231 Exudative age-related macular degeneration, bilateral, with active choroidal neovascularization: Secondary | ICD-10-CM

## 2017-05-04 DIAGNOSIS — H43813 Vitreous degeneration, bilateral: Secondary | ICD-10-CM

## 2017-05-04 DIAGNOSIS — I10 Essential (primary) hypertension: Secondary | ICD-10-CM | POA: Diagnosis not present

## 2017-05-04 DIAGNOSIS — H35033 Hypertensive retinopathy, bilateral: Secondary | ICD-10-CM | POA: Diagnosis not present

## 2017-05-08 ENCOUNTER — Ambulatory Visit: Payer: Federal, State, Local not specified - PPO | Attending: Orthopedic Surgery | Admitting: Physical Therapy

## 2017-05-08 DIAGNOSIS — M6281 Muscle weakness (generalized): Secondary | ICD-10-CM | POA: Diagnosis not present

## 2017-05-08 DIAGNOSIS — R2681 Unsteadiness on feet: Secondary | ICD-10-CM | POA: Diagnosis present

## 2017-05-08 NOTE — Therapy (Signed)
[facility] [place], [place], [address] Phone: [phone]   Fax:  [phone]  Physical Therapy Treatment  Patient Details  Name: Jenna Ramos MRN: [id number] Date of Birth: November 08, 1928 Referring Provider: Remo Lipps Case MD   Encounter Date: 05/08/2017  PT End of Session - 05/08/17 1419    Visit Number  4    Number of Visits  16    Date for PT Re-Evaluation  07/25/17    PT Start Time  0347    PT Stop Time  1433    PT Time Calculation (min)  45 min    Activity Tolerance  Patient tolerated treatment well    Behavior During Therapy  [facility] for tasks assessed/performed       Past Medical History:  Diagnosis Date  . Anemia    takes Iron pill daily  . Arthritis   . Carotid artery occlusion   . Fibromyalgia   . Fibromyalgia   . Glaucoma    uses Eye Drops daily  . HOH (hard of hearing)   . Hyperlipidemia    takes Atorvastatin daily  . Hypertension    takes Benazepril daily  . Hypothyroidism    takes Synthroid daily  . Macular degeneration    wet and gets injections in both eyes  . Nocturia   . Peripheral vascular disease ([place])   . Rectal incontinence   . Stroke [facility])     Past Surgical History:  Procedure Laterality Date  . APPENDECTOMY    . CAROTID ENDARTERECTOMY    . cataract surgery Bilateral   . COLONOSCOPY    . ENDARTERECTOMY Left 02/19/2014   Procedure: ENDARTERECTOMY CAROTID;  Surgeon: Conrad [place], MD;  Location: [place];  Service: Vascular;  Laterality: Left;  . ESOPHAGOGASTRODUODENOSCOPY    . TONSILLECTOMY    . TOTAL HIP ARTHROPLASTY Left 10/2013  . TUBAL LIGATION    . WRIST SURGERY     pins and screws    There were no vitals filed for this visit.  Subjective Assessment - 05/08/17 1351    Subjective  Greatest difficulty with balance being on uneven ground.. Reports that she has to walk downhill to her garden from her house.    Pertinent History  Left total hip replacement.    Limitations   Walking    How long can you walk comfortably?  Short distance but I feel I stagger.    Patient Stated Goals  Get outside and do more yardwork.    Currently in Pain?  No/denies         [facility] PT Assessment - 05/08/17 0001      Assessment   Medical Diagnosis  Poor balance.    Next MD Visit  None      Precautions   Precautions  Fall      Restrictions   Weight Bearing Restrictions  No                  [facility] Adult PT Treatment/Exercise - 05/08/17 0001      Knee/Hip Exercises: Aerobic   Nustep  L5 x15 min      Knee/Hip Exercises: Machines for Strengthening   Cybex Knee Extension  10# 3x10 reps    Cybex Knee Flexion  30# 3x10 reps      Knee/Hip Exercises: Standing   Heel Raises  Both;20 reps B toe raise x20 reps    Hip Flexion  AROM;Both;20 reps;Knee bent    Other Standing Knee Exercises  Sidestepping  green theraband x5 RT, forward and backward x5 RT green theraband    Other Standing Knee Exercises  A/P rockerboard x5 min with intermittant UE support more VCs for anterior COG      Knee/Hip Exercises: Seated   Sit to Sand  20 reps;without UE support      Knee/Hip Exercises: Supine   Bridges  Strengthening;Both;20 reps with airex for core challenge    Other Supine Knee/Hip Exercises  Mini crunches x20 reps               PT Short Term Goals - 04/26/17 1407      PT SHORT TERM GOAL #1   Title  Independent with an intial HEP.    Time  2    Period  Weeks    Status  New      PT SHORT TERM GOAL #2   Title  Improve Berg score to 48/56.    Time  2    Period  Weeks    Status  New        PT Long Term Goals - 04/26/17 1408      PT LONG TERM GOAL #1   Title  Independent with an advanced HEP.    Time  8    Period  Weeks    Status  New      PT LONG TERM GOAL #2   Title  Improve Berg score to 51-52/56.    Time  8    Period  Weeks    Status  New      PT LONG TERM GOAL #3   Title  Improve bilateral hip strength to 4+ to 5/5 to increase stability for  walking and functional activites.    Time  8    Period  Weeks    Status  New            Plan - 05/08/17 1449    Clinical Impression Statement  Patient tolerated today's treatment fairly well as she did well with all exercises and only complaint being of fatigue throughout treatment. Airex added to bridges to add challenge to core strengthening to improve balance. More resisted hip strengthening completed in parallel bars secondary to hip weakness assessed in evaluation. More posterior COG noted with rockerboard balance assessment. VCs by PTA provided for patient to attempt more pressure through her toes to train for more anterior COG. Supervision while on rockerboard required by PTA.    Rehab Potential  Excellent    PT Frequency  2x / week    PT Duration  8 weeks    PT Treatment/Interventions  ADLs/Self Care Home Management;Gait training;Stair training;Functional mobility training;Therapeutic activities;Therapeutic exercise;Balance training;Neuromuscular re-education;Patient/family education    PT Next Visit Plan  Core exercise; bilateral hip strengthening; balance and gait activites.    Consulted and Agree with Plan of Care  Patient       Patient will benefit from skilled therapeutic intervention in order to improve the following deficits and impairments:  Abnormal gait, Decreased activity tolerance, Decreased balance, Decreased strength  Visit Diagnosis: Muscle weakness (generalized)  Unsteadiness on feet     Problem List Patient Active Problem List   Diagnosis Date Noted  . Pre-diabetes 12/19/2016  . Hypothyroidism   . HLD (hyperlipidemia)   . Essential hypertension 05/04/2014  . Hip pain 03/31/2014  . TIA (transient ischemic attack) 01/23/2014  . Carotid artery disease ([place]) 01/14/2014  . 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 Fibromyalgia muscle pain  10/28/2013  . Acute blood loss anemia 10/28/2013  . Hip fracture requiring operative  repair ([place]) 10/17/2013  . Anemia     Standley Brooking, PTA 05/08/2017, 2:57 PM  [facility] [address] [place], [place], [address] Phone: [phone]   Fax:  [phone]  Name: Jenna Ramos MRN: [id number] Date of Birth: 1928-04-03

## 2017-05-10 ENCOUNTER — Telehealth: Payer: Self-pay | Admitting: Family Medicine

## 2017-05-10 ENCOUNTER — Ambulatory Visit: Payer: Federal, State, Local not specified - PPO | Admitting: Physical Therapy

## 2017-05-10 DIAGNOSIS — M6281 Muscle weakness (generalized): Secondary | ICD-10-CM | POA: Diagnosis not present

## 2017-05-10 DIAGNOSIS — R2681 Unsteadiness on feet: Secondary | ICD-10-CM

## 2017-05-10 MED ORDER — BENAZEPRIL HCL 20 MG PO TABS: 20.0000 mg | ORAL_TABLET | Freq: Every day | ORAL | 0 refills | Status: DC

## 2017-05-10 NOTE — Telephone Encounter (Signed)
Pt aware.

## 2017-05-10 NOTE — Telephone Encounter (Signed)
What is the name of the medication? benazepril (LOTENSIN) 20 MG tablet  Have you contacted your pharmacy to request a refill? Yes no refills  Which pharmacy would you like this sent to? [facility]   Patient notified that their request is being sent to the clinical staff for review and that they should receive a call once it is complete. If they do not receive a call within 24 hours they can check with their pharmacy or our office.

## 2017-05-10 NOTE — Telephone Encounter (Signed)
Refill sent to [facility]  

## 2017-05-10 NOTE — Telephone Encounter (Signed)
Benazapril sent to [facility]. Needs follow up appt for labs.   Laroy Apple, MD [facility] 05/10/2017, 2:38 PM

## 2017-05-10 NOTE — Therapy (Signed)
[facility] [place], [place], [address] Phone: [phone]   Fax:  [phone]  Physical Therapy Treatment  Patient Details  Name: Jenna Ramos MRN: [id number] Date of Birth: 11-Dec-1928 Referring Provider: Remo Lipps Case MD   Encounter Date: 05/10/2017  PT End of Session - 05/10/17 1413    Visit Number  5    Number of Visits  16    Date for PT Re-Evaluation  07/25/17    PT Start Time  0100    PT Stop Time  0144    PT Time Calculation (min)  44 min       Past Medical History:  Diagnosis Date  . Anemia    takes Iron pill daily  . Arthritis   . Carotid artery occlusion   . Fibromyalgia   . Fibromyalgia   . Glaucoma    uses Eye Drops daily  . HOH (hard of hearing)   . Hyperlipidemia    takes Atorvastatin daily  . Hypertension    takes Benazepril daily  . Hypothyroidism    takes Synthroid daily  . Macular degeneration    wet and gets injections in both eyes  . Nocturia   . Peripheral vascular disease ([place])   . Rectal incontinence   . Stroke [facility])     Past Surgical History:  Procedure Laterality Date  . APPENDECTOMY    . CAROTID ENDARTERECTOMY    . cataract surgery Bilateral   . COLONOSCOPY    . ENDARTERECTOMY Left 02/19/2014   Procedure: ENDARTERECTOMY CAROTID;  Surgeon: Conrad [place], MD;  Location: [place];  Service: Vascular;  Laterality: Left;  . ESOPHAGOGASTRODUODENOSCOPY    . TONSILLECTOMY    . TOTAL HIP ARTHROPLASTY Left 10/2013  . TUBAL LIGATION    . WRIST SURGERY     pins and screws    There were no vitals filed for this visit.                   [facility] Adult PT Treatment/Exercise - 05/10/17 0001      Exercises   Exercises  Shoulder;Knee/Hip      Knee/Hip Exercises: Aerobic   Nustep  Level 4 x 15 minutes.      Knee/Hip Exercises: Machines for Strengthening   Cybex Knee Extension  10# x 3 minutes.    Cybex Knee Flexion  30# x 3 minutes.      Shoulder Exercises:  ROM/Strengthening   UBE (Upper Arm Bike)  8 minutes at 120 RPM's (4 minutes forward and 4 minutes backward).          Balance Exercises - 05/10/17 1418      Balance Exercises: Standing   Other Standing Exercises  In parallel bars:  Neuro-re-eduation:  Rockerboard forward and back (3 minutes) and side to side (3 minutes) and then inverted BOSU ball x 3 minutes.          PT Short Term Goals - 04/26/17 1407      PT SHORT TERM GOAL #1   Title  Independent with an intial HEP.    Time  2    Period  Weeks    Status  New      PT SHORT TERM GOAL #2   Title  Improve Berg score to 48/56.    Time  2    Period  Weeks    Status  New        PT Long Term Goals - 04/26/17 1408  PT LONG TERM GOAL #1   Title  Independent with an advanced HEP.    Time  8    Period  Weeks    Status  New      PT LONG TERM GOAL #2   Title  Improve Berg score to 51-52/56.    Time  8    Period  Weeks    Status  New      PT LONG TERM GOAL #3   Title  Improve bilateral hip strength to 4+ to 5/5 to increase stability for walking and functional activites.    Time  8    Period  Weeks    Status  New            Plan - 05/10/17 1422    Clinical Impression Statement  The patient did great today.  Her core activiation is better during neuro-re-education activites.  She stated she can already see improvement as she is able to go up stairs easier.    PT Treatment/Interventions  ADLs/Self Care Home Management;Gait training;Stair training;Functional mobility training;Therapeutic activities;Therapeutic exercise;Balance training;Neuromuscular re-education;Patient/family education    PT Next Visit Plan  Core exercise; bilateral hip strengthening; balance and gait activites.    Consulted and Agree with Plan of Care  Patient       Patient will benefit from skilled therapeutic intervention in order to improve the following deficits and impairments:  Abnormal gait, Decreased activity tolerance, Decreased  balance, Decreased strength  Visit Diagnosis: Muscle weakness (generalized)  Unsteadiness on feet     Problem List Patient Active Problem List   Diagnosis Date Noted  . Pre-diabetes 12/19/2016  . Hypothyroidism   . HLD (hyperlipidemia)   . Essential hypertension 05/04/2014  . Hip pain 03/31/2014  . TIA (transient ischemic attack) 01/23/2014  . Carotid artery disease ([place]) 01/14/2014  . 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 Fibromyalgia muscle pain 10/28/2013  . Acute blood loss anemia 10/28/2013  . Hip fracture requiring operative repair ([place]) 10/17/2013  . Anemia     Gianelle Mccaul, [place] MPT 05/10/2017, 2:24 PM  [facility] [address] [place], [place], [address] Phone: [phone]   Fax:  [phone]  Name: Jenna Ramos MRN: [id number] Date of Birth: 20-May-1928

## 2017-05-10 NOTE — Telephone Encounter (Signed)
Last seen 06/28/16- please review and advise

## 2017-05-15 ENCOUNTER — Encounter: Payer: Self-pay | Admitting: *Deleted

## 2017-05-15 ENCOUNTER — Ambulatory Visit: Payer: Federal, State, Local not specified - PPO | Admitting: *Deleted

## 2017-05-15 DIAGNOSIS — M6281 Muscle weakness (generalized): Secondary | ICD-10-CM

## 2017-05-15 DIAGNOSIS — R2681 Unsteadiness on feet: Secondary | ICD-10-CM

## 2017-05-15 NOTE — Therapy (Signed)
[facility] [place], [place], [address] Phone: [phone]   Fax:  [phone]  Physical Therapy Treatment  Patient Details  Name: Jenna Ramos MRN: [id number] Date of Birth: [birth year] Referring Provider: Remo Lipps Case MD   Encounter Date: 05/15/2017  PT End of Session - 05/15/17 1309    Visit Number  6    Number of Visits  16    Date for PT Re-Evaluation  07/25/17    PT Start Time  1300    PT Stop Time  1348    PT Time Calculation (min)  48 min    Activity Tolerance  Patient tolerated treatment well    Behavior During Therapy  [facility] for tasks assessed/performed       Past Medical History:  Diagnosis Date  . Anemia    takes Iron pill daily  . Arthritis   . Carotid artery occlusion   . Fibromyalgia   . Fibromyalgia   . Glaucoma    uses Eye Drops daily  . HOH (hard of hearing)   . Hyperlipidemia    takes Atorvastatin daily  . Hypertension    takes Benazepril daily  . Hypothyroidism    takes Synthroid daily  . Macular degeneration    wet and gets injections in both eyes  . Nocturia   . Peripheral vascular disease ([place])   . Rectal incontinence   . Stroke [facility])     Past Surgical History:  Procedure Laterality Date  . APPENDECTOMY    . CAROTID ENDARTERECTOMY    . cataract surgery Bilateral   . COLONOSCOPY    . ENDARTERECTOMY Left 02/19/2014   Procedure: ENDARTERECTOMY CAROTID;  Surgeon: Conrad [place], MD;  Location: [place];  Service: Vascular;  Laterality: Left;  . ESOPHAGOGASTRODUODENOSCOPY    . TONSILLECTOMY    . TOTAL HIP ARTHROPLASTY Left 10/2013  . TUBAL LIGATION    . WRIST SURGERY     pins and screws    There were no vitals filed for this visit.  Subjective Assessment - 05/15/17 1308    Subjective  Did ok after last Rx    Pertinent History  Left total hip replacement.    Limitations  Walking    How long can you walk comfortably?  Short distance but I feel I stagger.    Patient Stated  Goals  Get outside and do more yardwork.    Currently in Pain?  No/denies                      [facility] Adult PT Treatment/Exercise - 05/15/17 0001      Exercises   Exercises  Shoulder;Knee/Hip      Knee/Hip Exercises: Aerobic   Nustep  Level 4 x 15 minutes.      Knee/Hip Exercises: Machines for Strengthening   Cybex Knee Extension  10# x 3 minutes.    Cybex Knee Flexion  30# x 3 minutes.      Knee/Hip Exercises: Seated   Sit to Sand  --      Shoulder Exercises: ROM/Strengthening   UBE (Upper Arm Bike)  3 minutes at 120 RPM's          Balance Exercises - 05/15/17 1313      Balance Exercises: Standing   Other Standing Exercises  In parallel bars:  Neuro-re-eduation:  Rockerboard forward and back (3 minutes) and side to side (3 minutes) and then inverted BOSU ball x 3 minutes.  PT Short Term Goals - 04/26/17 1407      PT SHORT TERM GOAL #1   Title  Independent with an intial HEP.    Time  2    Period  Weeks    Status  New      PT SHORT TERM GOAL #2   Title  Improve Berg score to 48/56.    Time  2    Period  Weeks    Status  New        PT Long Term Goals - 04/26/17 1408      PT LONG TERM GOAL #1   Title  Independent with an advanced HEP.    Time  8    Period  Weeks    Status  New      PT LONG TERM GOAL #2   Title  Improve Berg score to 51-52/56.    Time  8    Period  Weeks    Status  New      PT LONG TERM GOAL #3   Title  Improve bilateral hip strength to 4+ to 5/5 to increase stability for walking and functional activites.    Time  8    Period  Weeks    Status  New            Plan - 05/15/17 1310    Clinical Impression Statement  Pt arrived today feeling fairly good. She was able to complete all therex and balance Acts and continues to progress. She states she can get up and down her stairs easier at home now.    Clinical Presentation  Evolving    Clinical Decision Making  Moderate    Rehab Potential  Excellent     PT Frequency  2x / week    PT Duration  8 weeks    PT Treatment/Interventions  ADLs/Self Care Home Management;Gait training;Stair training;Functional mobility training;Therapeutic activities;Therapeutic exercise;Balance training;Neuromuscular re-education;Patient/family education    PT Next Visit Plan  Core exercise; bilateral hip strengthening; balance and gait activites.    Consulted and Agree with Plan of Care  Patient       Patient will benefit from skilled therapeutic intervention in order to improve the following deficits and impairments:  Abnormal gait, Decreased activity tolerance, Decreased balance, Decreased strength  Visit Diagnosis: Muscle weakness (generalized)  Unsteadiness on feet     Problem List Patient Active Problem List   Diagnosis Date Noted  . Pre-diabetes 12/19/2016  . Hypothyroidism   . HLD (hyperlipidemia)   . Essential hypertension 05/04/2014  . Hip pain 03/31/2014  . TIA (transient ischemic attack) 01/23/2014  . Carotid artery disease ([place]) 01/14/2014  . 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 Fibromyalgia muscle pain 10/28/2013  . Acute blood loss anemia 10/28/2013  . Hip fracture requiring operative repair ([place]) 10/17/2013  . Anemia     RAMSEUR,CHRIS, PTA 05/15/2017, 2:09 PM  [facility] [address] [place], [place], [address] Phone: [phone]   Fax:  [phone]  Name: Jenna Ramos MRN: [id number] Date of Birth: 1929/01/05

## 2017-05-17 ENCOUNTER — Ambulatory Visit: Payer: Federal, State, Local not specified - PPO | Admitting: Physical Therapy

## 2017-05-17 ENCOUNTER — Encounter: Payer: Self-pay | Admitting: Physical Therapy

## 2017-05-17 DIAGNOSIS — M6281 Muscle weakness (generalized): Secondary | ICD-10-CM

## 2017-05-17 DIAGNOSIS — R2681 Unsteadiness on feet: Secondary | ICD-10-CM

## 2017-05-17 NOTE — Patient Instructions (Signed)
  Bridging   Slowly raise buttocks from floor, keeping stomach tight. Repeat _10___ times per set. Do __2__ sets per session. Do __2__ sessions per day.       Lower Body: Toe Rise   Standing, place feet apart. Hold arms out for balance or use support. Rise up on toes. Hold _3___ seconds, then lower. Repeat immediately. Repeat __10__ times. Do __2__ sessions per day.   Half Squat to Chair   Stand with feet shoulder width apart. Push buttocks backward and lower slowly, sitting in chair lightly and returning to standing position. Complete _2_ sets of 10_ repetitions. Perform __2-3_ sessions per day.

## 2017-05-17 NOTE — Therapy (Signed)
[facility] [place], [place], [address] Phone: [phone]   Fax:  [phone]  Physical Therapy Treatment  Patient Details  Name: Jenna Ramos MRN: [id number] Date of Birth: [birth year] Referring Provider: Remo Lipps Case MD   Encounter Date: 05/17/2017  PT End of Session - 05/17/17 1426    Visit Number  7    Number of Visits  16    Date for PT Re-Evaluation  07/25/17    PT Start Time  1344    PT Stop Time  1426    PT Time Calculation (min)  42 min    Activity Tolerance  Patient tolerated treatment well    Behavior During Therapy  [facility] for tasks assessed/performed       Past Medical History:  Diagnosis Date  . Anemia    takes Iron pill daily  . Arthritis   . Carotid artery occlusion   . Fibromyalgia   . Fibromyalgia   . Glaucoma    uses Eye Drops daily  . HOH (hard of hearing)   . Hyperlipidemia    takes Atorvastatin daily  . Hypertension    takes Benazepril daily  . Hypothyroidism    takes Synthroid daily  . Macular degeneration    wet and gets injections in both eyes  . Nocturia   . Peripheral vascular disease ([place])   . Rectal incontinence   . Stroke [facility])     Past Surgical History:  Procedure Laterality Date  . APPENDECTOMY    . CAROTID ENDARTERECTOMY    . cataract surgery Bilateral   . COLONOSCOPY    . ENDARTERECTOMY Left 02/19/2014   Procedure: ENDARTERECTOMY CAROTID;  Surgeon: Conrad Leroy, MD;  Location: [place];  Service: Vascular;  Laterality: Left;  . ESOPHAGOGASTRODUODENOSCOPY    . TONSILLECTOMY    . TOTAL HIP ARTHROPLASTY Left 10/2013  . TUBAL LIGATION    . WRIST SURGERY     pins and screws    There were no vitals filed for this visit.  Subjective Assessment - 05/17/17 1347    Subjective  Patient arrived with no complaints, feeling good today and feels good after last treatment. Reported she was able to get outside to do yard work today.     Pertinent History  Left total hip  replacement.    Limitations  Walking    How long can you walk comfortably?  Short distance but I feel I stagger.    Patient Stated Goals  Get outside and do more yardwork.    Currently in Pain?  No/denies         [facility] PT Assessment - 05/17/17 0001      Berg Balance Test   Sit to Stand  Able to stand without using hands and stabilize independently    Standing Unsupported  Able to stand safely 2 minutes    Sitting with Back Unsupported but Feet Supported on Floor or Stool  Able to sit safely and securely 2 minutes    Stand to Sit  Sits safely with minimal use of hands    Transfers  Able to transfer safely, minor use of hands    Standing Unsupported with Eyes Closed  Able to stand 10 seconds safely    Standing Ubsupported with Feet Together  Able to place feet together independently and stand 1 minute safely    From Standing, Reach Forward with Outstretched Arm  Can reach confidently >25 cm (10")    From Standing Position, Pick up  Object from Floor  Able to pick up shoe safely and easily    From Standing Position, Turn to Look Behind Over each Shoulder  Looks behind from both sides and weight shifts well    Turn 360 Degrees  Able to turn 360 degrees safely in 4 seconds or less    Standing Unsupported, Alternately Place Feet on Step/Stool  Able to stand independently and complete 8 steps >20 seconds    Standing Unsupported, One Foot in Front  Able to plae foot ahead of the other independently and hold 30 seconds    Standing on One Leg  Tries to lift leg/unable to hold 3 seconds but remains standing independently    Total Score  51                  [facility] Adult PT Treatment/Exercise - 05/17/17 0001      Knee/Hip Exercises: Aerobic   Nustep  Level 4 x 15 minutes. (1095 steps)      Knee/Hip Exercises: Machines for Strengthening   Cybex Knee Extension  10# 2x fatigue    Cybex Knee Flexion  30# 2x fatigue          Balance Exercises - 05/17/17 1415      Balance  Exercises: Standing   Standing Eyes Opened  Narrow base of support (BOS);Wide (BOA);Solid surface;Other reps (comment);Time    Standing Eyes Closed  Wide (BOA);Solid surface;2 reps    Tandem Stance  Eyes open;4 reps;Limitations difficulty with tandem yet better with stance    SLS  Eyes open;1 rep    Partial Tandem Stance  Eyes open;4 reps    Heel Raises Limitations  2x10    Sit to Stand Time  x10        PT Education - 05/17/17 1353    Education provided  Yes    Education Details  HEP    Person(s) Educated  Patient    Methods  Explanation;Demonstration;Handout    Comprehension  Verbalized understanding;Returned demonstration       PT Short Term Goals - 05/17/17 1354      PT SHORT TERM GOAL #1   Title  Independent with an intial HEP.    Time  2    Period  Weeks    Status  Achieved      PT SHORT TERM GOAL #2   Title  Improve Berg score to 48/56.    Time  2    Period  Weeks    Status  Achieved 51/56 05/17/17        PT Long Term Goals - 05/17/17 1354      PT LONG TERM GOAL #1   Title  Independent with an advanced HEP.    Time  8    Period  Weeks    Status  On-going      PT LONG TERM GOAL #2   Title  Improve Berg score to 51-52/56.    Time  8    Period  Weeks    Status  Achieved 51/56 05/17/17      PT LONG TERM GOAL #3   Title  Improve bilateral hip strength to 4+ to 5/5 to increase stability for walking and functional activites.    Time  8    Period  Weeks    Status  On-going            Plan - 05/17/17 1422    Clinical Impression Statement  Patient tolerated treatment well today. Patient as improved  balanace score today and has minimal balance limitations. 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 Patient has some ongoing strength limitations at this time. HEP given today. Patient met STG's  and LTG #2 today.      Rehab Potential  Excellent    PT Frequency  2x / week    PT Duration  8 weeks    PT  Treatment/Interventions  ADLs/Self Care Home Management;Gait training;Stair training;Functional mobility training;Therapeutic activities;Therapeutic exercise;Balance training;Neuromuscular re-education;Patient/family education    PT Next Visit Plan  Core exercise/bilateral hip strengthening/ gait activites.    Consulted and Agree with Plan of Care  Patient       Patient will benefit from skilled therapeutic intervention in order to improve the following deficits and impairments:  Abnormal gait, Decreased activity tolerance, Decreased balance, Decreased strength  Visit Diagnosis: Muscle weakness (generalized)  Unsteadiness on feet     Problem List Patient Active Problem List   Diagnosis Date Noted  . Pre-diabetes 12/19/2016  . Hypothyroidism   . HLD (hyperlipidemia)   . Essential hypertension 05/04/2014  . Hip pain 03/31/2014  . TIA (transient ischemic attack) 01/23/2014  . Carotid artery disease ([place]) 01/14/2014  . 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 Fibromyalgia muscle pain 10/28/2013  . Acute blood loss anemia 10/28/2013  . Hip fracture requiring operative repair ([place]) 10/17/2013  . Anemia     Nakiesha Rumsey P, PTA 05/17/2017, 2:27 PM  [facility] [place], [place], [address] Phone: [phone]   Fax:  [phone]  Name: Jenna Ramos MRN: [id number] Date of Birth: 01-18-1929

## 2017-05-22 ENCOUNTER — Ambulatory Visit: Payer: Federal, State, Local not specified - PPO | Admitting: Physical Therapy

## 2017-05-22 DIAGNOSIS — M6281 Muscle weakness (generalized): Secondary | ICD-10-CM

## 2017-05-22 DIAGNOSIS — R2681 Unsteadiness on feet: Secondary | ICD-10-CM

## 2017-05-22 NOTE — Therapy (Signed)
[facility] [place], [place], [address] Phone: [phone]   Fax:  [phone]  Physical Therapy Treatment  Patient Details  Name: Jenna Ramos MRN: [id number] Date of Birth: March 14, 1928 Referring Provider: Remo Lipps Case MD   Encounter Date: 05/22/2017    Past Medical History:  Diagnosis Date  . Anemia    takes Iron pill daily  . Arthritis   . Carotid artery occlusion   . Fibromyalgia   . Fibromyalgia   . Glaucoma    uses Eye Drops daily  . HOH (hard of hearing)   . Hyperlipidemia    takes Atorvastatin daily  . Hypertension    takes Benazepril daily  . Hypothyroidism    takes Synthroid daily  . Macular degeneration    wet and gets injections in both eyes  . Nocturia   . Peripheral vascular disease ([place])   . Rectal incontinence   . Stroke [facility])     Past Surgical History:  Procedure Laterality Date  . APPENDECTOMY    . CAROTID ENDARTERECTOMY    . cataract surgery Bilateral   . COLONOSCOPY    . ENDARTERECTOMY Left 02/19/2014   Procedure: ENDARTERECTOMY CAROTID;  Surgeon: Conrad [place], MD;  Location: [place];  Service: Vascular;  Laterality: Left;  . ESOPHAGOGASTRODUODENOSCOPY    . TONSILLECTOMY    . TOTAL HIP ARTHROPLASTY Left 10/2013  . TUBAL LIGATION    . WRIST SURGERY     pins and screws    There were no vitals filed for this visit.  Subjective Assessment - 05/22/17 1913    Subjective  Patient reported no new complaints.    Pertinent History  Left total hip replacement.    Limitations  Walking    How long can you walk comfortably?  Short distance but I feel I stagger.    Patient Stated Goals  Get outside and do more yardwork.         [facility] PT Assessment - 05/22/17 0001      Assessment   Medical Diagnosis  Poor balance.                  [facility] Adult PT Treatment/Exercise - 05/22/17 0001      Knee/Hip Exercises: Aerobic   Stationary Bike  Level 3 x15 minutes; RPM around  50-55      Knee/Hip Exercises: Machines for Strengthening   Cybex Knee Extension  10#  3 minutes    Cybex Knee Flexion  30# 3 minutes          Balance Exercises - 05/22/17 1906      Balance Exercises: Standing   SLS  Eyes open;Solid surface;1 rep;Intermittent upper extremity support 1 minute hold each with intermittent toe touch down    Tandem Gait  Forward;Intermittent upper extremity support;4 reps along kitchen counter    Sidestepping  Upper extremity support 2 minutes    Marching Limitations  slow marching with intermittent UE support x 30    Heel Raises Limitations  2x10          PT Short Term Goals - 05/17/17 1354      PT SHORT TERM GOAL #1   Title  Independent with an intial HEP.    Time  2    Period  Weeks    Status  Achieved      PT SHORT TERM GOAL #2   Title  Improve Berg score to 48/56.    Time  2    Period  Weeks    Status  Achieved 51/56 05/17/17        PT Long Term Goals - 05/17/17 1354      PT LONG TERM GOAL #1   Title  Independent with an advanced HEP.    Time  8    Period  Weeks    Status  On-going      PT LONG TERM GOAL #2   Title  Improve Berg score to 51-52/56.    Time  8    Period  Weeks    Status  Achieved 51/56 05/17/17      PT LONG TERM GOAL #3   Title  Improve bilateral hip strength to 4+ to 5/5 to increase stability for walking and functional activites.    Time  8    Period  Weeks    Status  On-going            Plan - 05/22/17 1909    Clinical Impression Statement  Patient was able to complete treatment well. Patient able to complete balance exercises with minimal sway but required toe touchdown and intermittent UE support. Patient had one loss of balance which required PT contact guard to prevent a fall during tandem walking. Patient was able to continue with just minimal sway and arms in high guard.     Clinical Presentation  Evolving    Clinical Decision Making  Moderate    Rehab Potential  Excellent    PT Frequency   2x / week    PT Duration  8 weeks    PT Treatment/Interventions  ADLs/Self Care Home Management;Gait training;Stair training;Functional mobility training;Therapeutic activities;Therapeutic exercise;Balance training;Neuromuscular re-education;Patient/family education    PT Next Visit Plan  Core exercise/bilateral hip strengthening/ gait activites.    Consulted and Agree with Plan of Care  Patient       Patient will benefit from skilled therapeutic intervention in order to improve the following deficits and impairments:  Abnormal gait, Decreased activity tolerance, Decreased balance, Decreased strength  Visit Diagnosis: Muscle weakness (generalized)  Unsteadiness on feet     Problem List Patient Active Problem List   Diagnosis Date Noted  . Pre-diabetes 12/19/2016  . Hypothyroidism   . HLD (hyperlipidemia)   . Essential hypertension 05/04/2014  . Hip pain 03/31/2014  . TIA (transient ischemic attack) 01/23/2014  . Carotid artery disease ([place]) 01/14/2014  . 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 Fibromyalgia muscle pain 10/28/2013  . Acute blood loss anemia 10/28/2013  . Hip fracture requiring operative repair ([place]) 10/17/2013  . Anemia    Gabriela Eves, PT, DPT 05/22/2017, 7:13 PM  [facility] [address] [place], [place], [address] Phone: [phone]   Fax:  [phone]  Name: Jenna Ramos MRN: [id number] Date of Birth: 10-31-1928

## 2017-05-24 ENCOUNTER — Other Ambulatory Visit: Payer: Self-pay | Admitting: *Deleted

## 2017-05-24 ENCOUNTER — Encounter: Payer: Self-pay | Admitting: Physical Therapy

## 2017-05-24 ENCOUNTER — Ambulatory Visit: Payer: Federal, State, Local not specified - PPO | Admitting: Physical Therapy

## 2017-05-24 DIAGNOSIS — M6281 Muscle weakness (generalized): Secondary | ICD-10-CM

## 2017-05-24 DIAGNOSIS — R2681 Unsteadiness on feet: Secondary | ICD-10-CM

## 2017-05-24 MED ORDER — AMLODIPINE BESYLATE 5 MG PO TABS: 5.0000 mg | ORAL_TABLET | Freq: Every day | ORAL | 0 refills | Status: DC

## 2017-05-24 NOTE — Therapy (Addendum)
[facility] [address] [place], [place], [address] Phone: [phone]   Fax:  [phone]  Physical Therapy Treatment  Patient Details  Name: Jenna Ramos MRN: [id number] Date of Birth: 05/23/1928 Referring Provider: Steven Case MD   Encounter Date: 05/24/2017  PT End of Session - 05/24/17 1320    Visit Number  8    Number of Visits  16    Date for PT Re-Evaluation  07/25/17    PT Start Time  1300    PT Stop Time  1347    PT Time Calculation (min)  47 min    Activity Tolerance  Patient tolerated treatment well    Behavior During Therapy  WFL for tasks assessed/performed       Past Medical History:  Diagnosis Date  . Anemia    takes Iron pill daily  . Arthritis   . Carotid artery occlusion   . Fibromyalgia   . Fibromyalgia   . Glaucoma    uses Eye Drops daily  . HOH (hard of hearing)   . Hyperlipidemia    takes Atorvastatin daily  . Hypertension    takes Benazepril daily  . Hypothyroidism    takes Synthroid daily  . Macular degeneration    wet and gets injections in both eyes  . Nocturia   . Peripheral vascular disease (HCC)   . Rectal incontinence   . Stroke (HCC)     Past Surgical History:  Procedure Laterality Date  . APPENDECTOMY    . CAROTID ENDARTERECTOMY    . cataract surgery Bilateral   . COLONOSCOPY    . ENDARTERECTOMY Left 02/19/2014   Procedure: ENDARTERECTOMY CAROTID;  Surgeon: Brian L Chen, MD;  Location: [facility] OR;  Service: Vascular;  Laterality: Left;  . ESOPHAGOGASTRODUODENOSCOPY    . TONSILLECTOMY    . TOTAL HIP ARTHROPLASTY Left 10/2013  . TUBAL LIGATION    . WRIST SURGERY     pins and screws    There were no vitals filed for this visit.  Subjective Assessment - 05/24/17 1319    Subjective  Denies any new falls but very fatigued following L5 on nustep.    Pertinent History  Left total hip replacement.    Limitations  Walking    How long can you walk comfortably?  Short distance  but I feel I stagger.    Patient Stated Goals  Get outside and do more yardwork.    Currently in Pain?  No/denies         [facility] PT Assessment - 05/24/17 0001      Assessment   Medical Diagnosis  Poor balance.      ROM / Strength   AROM / PROM / Strength  Strength      Strength   Overall Strength  Within functional limits for tasks performed    Strength Assessment Site  Hip;Knee    Right/Left Hip  Right;Left    Right Hip Flexion  4+/5    Right Hip ABduction  4+/5    Left Hip Flexion  4+/5    Left Hip ABduction  4+/5    Right/Left Knee  Right;Left    Right Knee Flexion  4+/5    Right Knee Extension  5/5    Left Knee Flexion  5/5    Left Knee Extension  5/5                  [facility] Adult PT Treatment/Exercise - 05/24/17 0001        Knee/Hip Exercises: Aerobic   Nustep  L5 x15 min      Knee/Hip Exercises: Machines for Strengthening   Cybex Knee Extension  20# 2x10 reps    Cybex Knee Flexion  40# 2x10 reps      Knee/Hip Exercises: Standing   Heel Raises  Both;20 reps    Hip Flexion  AROM;Both;20 reps;Knee bent to 8" step; for hip strengthening/balance    Hip Abduction  Stengthening;Both;20 reps;Knee straight    Functional Squat  10 reps      Knee/Hip Exercises: Supine   Bridges with Ball Squeeze  Strengthening;2 sets;10 reps      Knee/Hip Exercises: Sidelying   Hip ABduction  Strengthening;Both;2 sets;10 reps             PT Education - 05/24/17 1352    Education provided  Yes    Education Details  HEP- SL hip abduction, squat    Person(s) Educated  Patient    Methods  Explanation;Handout    Comprehension  Verbalized understanding       PT Short Term Goals - 05/17/17 1354      PT SHORT TERM GOAL #1   Title  Independent with an intial HEP.    Time  2    Period  Weeks    Status  Achieved      PT SHORT TERM GOAL #2   Title  Improve Berg score to 48/56.    Time  2    Period  Weeks    Status  Achieved 51/56 05/17/17        PT Long Term  Goals - 05/24/17 1352      PT LONG TERM GOAL #1   Title  Independent with an advanced HEP.    Time  8    Period  Weeks    Status  Deferred Advanced HEP provided at discharge      PT LONG TERM GOAL #2   Title  Improve Berg score to 51-52/56.    Time  8    Period  Weeks    Status  Achieved 51/56 05/17/17      PT LONG TERM GOAL #3   Title  Improve bilateral hip strength to 4+ to 5/5 to increase stability for walking and functional activites.    Time  8    Period  Weeks    Status  Achieved            Plan - 05/24/17 1355    Clinical Impression Statement  Patient has progressed well in regards to balance as well as hip strengthening. Patient has achieved all goals set at evaluation. Patient's B hip strength assessed as within goal range. Patient able to complete more advanced hip strengthening in various positiions well with rest breaks. Patient consented to discharge from PT due to goal achievement. Patient provided new HEP with instructions for rest breaks if required.     Rehab Potential  Excellent    PT Frequency  2x / week    PT Duration  8 weeks    PT Treatment/Interventions  ADLs/Self Care Home Management;Gait training;Stair training;Functional mobility training;Therapeutic activities;Therapeutic exercise;Balance training;Neuromuscular re-education;Patient/family education    PT Next Visit Plan  D/C summary required.    Consulted and Agree with Plan of Care  Patient       Patient will benefit from skilled therapeutic intervention in order to improve the following deficits and impairments:  Abnormal gait, Decreased activity tolerance, Decreased balance, Decreased strength  Visit Diagnosis: Muscle weakness (  generalized)  Unsteadiness on feet     Problem List Patient Active Problem List   Diagnosis Date Noted  . Pre-diabetes 12/19/2016  . Hypothyroidism   . HLD (hyperlipidemia)   . Essential hypertension 05/04/2014  . Hip pain 03/31/2014  . TIA (transient  ischemic attack) 01/23/2014  . Carotid artery disease ([place]) 01/14/2014  . 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 Fibromyalgia muscle pain 10/28/2013  . Acute blood loss anemia 10/28/2013  . Hip fracture requiring operative repair ([place]) 10/17/2013  . Anemia     Standley Brooking, PTA 05/24/17 2:34 PM   [facility], [place], [address] Phone: [phone]   Fax:  [phone]  Name: PEIGHTYN ROBERSON MRN: [id number] Date of Birth: 12/18/1928  PHYSICAL THERAPY DISCHARGE SUMMARY  Visits from Start of Care: 8.  Current functional level related to goals / functional outcomes: See above.   Remaining deficits: All goals met.   Education / Equipment: HEP. Plan: Patient agrees to discharge.  Patient goals were met. Patient is being discharged due to meeting the stated rehab goals.  ?????         Mali Applegate MPT

## 2017-06-08 ENCOUNTER — Encounter (INDEPENDENT_AMBULATORY_CARE_PROVIDER_SITE_OTHER): Payer: Federal, State, Local not specified - PPO | Admitting: Ophthalmology

## 2017-06-08 DIAGNOSIS — H353231 Exudative age-related macular degeneration, bilateral, with active choroidal neovascularization: Secondary | ICD-10-CM

## 2017-06-08 DIAGNOSIS — H35033 Hypertensive retinopathy, bilateral: Secondary | ICD-10-CM

## 2017-06-08 DIAGNOSIS — I10 Essential (primary) hypertension: Secondary | ICD-10-CM | POA: Diagnosis not present

## 2017-06-08 DIAGNOSIS — H43813 Vitreous degeneration, bilateral: Secondary | ICD-10-CM

## 2017-06-19 ENCOUNTER — Ambulatory Visit: Payer: Federal, State, Local not specified - PPO | Admitting: Family Medicine

## 2017-06-30 ENCOUNTER — Other Ambulatory Visit: Payer: Self-pay | Admitting: Family Medicine

## 2017-07-05 ENCOUNTER — Encounter: Payer: Self-pay | Admitting: Family Medicine

## 2017-07-05 ENCOUNTER — Ambulatory Visit: Payer: Federal, State, Local not specified - PPO | Admitting: Family Medicine

## 2017-07-05 VITALS — BP 152/72 | HR 76 | Temp 97.3°F | Ht 63.0 in | Wt 130.4 lb

## 2017-07-05 DIAGNOSIS — I10 Essential (primary) hypertension: Secondary | ICD-10-CM | POA: Diagnosis not present

## 2017-07-05 DIAGNOSIS — R7303 Prediabetes: Secondary | ICD-10-CM | POA: Diagnosis not present

## 2017-07-05 DIAGNOSIS — E039 Hypothyroidism, unspecified: Secondary | ICD-10-CM

## 2017-07-05 LAB — BAYER DCA HB A1C WAIVED: HB A1C: 5.7 % (ref <7.0)

## 2017-07-05 NOTE — Patient Instructions (Signed)
Great to see you!  Come back to see Dr. Lajuana Ripple in 6 months unless you need [place] sooner.

## 2017-07-05 NOTE — Progress Notes (Signed)
   HPI  Patient presents today here for follow-up chronic medical conditions.  Hypothyroidism Asymptomatic Good medication compliance.  Hypertension Patient states that her average blood pressure at home is in the 1 teens and 120s. No headache or chest pain.  She has a history of prediabetes which she is aware of, watching diet minimally   PMH: Smoking status noted ROS: Per HPI  Objective: BP (!) 152/72   Pulse 76   Temp (!) 97.3 F (36.3 C) (Oral)   Ht '5\' 3"'$  (1.6 m)   Wt 130 lb 6.4 oz (59.1 kg)   BMI 23.10 kg/m  Gen: NAD, alert, cooperative with exam HEENT: NCAT CV: RRR, good S1/S2, no murmur Resp: CTABL, no wheezes, non-labored Ext: No edema, warm Neuro: Alert and oriented, No gross deficits  Assessment and plan:  #Hypertension Elevated today, however controlled at home and considering age of 10,  152/72 is not too far out of her desired range. No changes, continue amlodipine, benazepril  #Hypothyroidism TSH previously very low, Synthroid was adjusted, repeat labs  #Prediabetes Diet changes only, clinically stable A1c    Orders Placed This Encounter  Procedures  . CMP14+EGFR  . CBC with Differential/Platelet  . Lipid panel  . TSH  . [facility] Hb A1c Carney Bern, MD [facility] 07/05/2017, 11:06 AM

## 2017-07-06 LAB — CBC WITH DIFFERENTIAL/PLATELET
BASOS: 0 %
Basophils Absolute: 0 10*3/uL (ref 0.0–0.2)
EOS (ABSOLUTE): 0.1 10*3/uL (ref 0.0–0.4)
EOS: 2 %
HEMATOCRIT: 36.7 % (ref 34.0–46.6)
HEMOGLOBIN: 12 g/dL (ref 11.1–15.9)
IMMATURE GRANS (ABS): 0 10*3/uL (ref 0.0–0.1)
Immature Granulocytes: 0 %
LYMPHS ABS: 1.8 10*3/uL (ref 0.7–3.1)
LYMPHS: 26 %
MCH: 28.5 pg (ref 26.6–33.0)
MCHC: 32.7 g/dL (ref 31.5–35.7)
MCV: 87 fL (ref 79–97)
MONOCYTES: 10 %
Monocytes Absolute: 0.7 10*3/uL (ref 0.1–0.9)
NEUTROS ABS: 4.4 10*3/uL (ref 1.4–7.0)
Neutrophils: 62 %
Platelets: 319 10*3/uL (ref 150–379)
RBC: 4.21 x10E6/uL (ref 3.77–5.28)
RDW: 14.4 % (ref 12.3–15.4)
WBC: 7.1 10*3/uL (ref 3.4–10.8)

## 2017-07-06 LAB — CMP14+EGFR
A/G RATIO: 1.6 (ref 1.2–2.2)
ALBUMIN: 4.4 g/dL (ref 3.5–4.7)
ALT: 16 IU/L (ref 0–32)
AST: 21 IU/L (ref 0–40)
Alkaline Phosphatase: 67 IU/L (ref 39–117)
BILIRUBIN TOTAL: 0.3 mg/dL (ref 0.0–1.2)
BUN / CREAT RATIO: 30 — AB (ref 12–28)
BUN: 23 mg/dL (ref 8–27)
CALCIUM: 9.6 mg/dL (ref 8.7–10.3)
CO2: 25 mmol/L (ref 20–29)
Chloride: 101 mmol/L (ref 96–106)
Creatinine, Ser: 0.77 mg/dL (ref 0.57–1.00)
GFR calc Af Amer: 80 mL/min/{1.73_m2} (ref >59)
GFR, EST NON AFRICAN AMERICAN: 70 mL/min/{1.73_m2} (ref >59)
GLUCOSE: 122 mg/dL — AB (ref 65–99)
Globulin, Total: 2.8 g/dL (ref 1.5–4.5)
Potassium: 4.3 mmol/L (ref 3.5–5.2)
Sodium: 141 mmol/L (ref 134–144)
Total Protein: 7.2 g/dL (ref 6.0–8.5)

## 2017-07-06 LAB — LIPID PANEL
CHOL/HDL RATIO: 5.6 ratio — AB (ref 0.0–4.4)
Cholesterol, Total: 231 mg/dL — ABNORMAL HIGH (ref 100–199)
HDL: 41 mg/dL (ref >39)
LDL CALC: 117 mg/dL — AB (ref 0–99)
Triglycerides: 365 mg/dL — ABNORMAL HIGH (ref 0–149)
VLDL CHOLESTEROL CAL: 73 mg/dL — AB (ref 5–40)

## 2017-07-06 LAB — TSH: TSH: 0.863 u[IU]/mL (ref 0.450–4.500)

## 2017-07-17 ENCOUNTER — Encounter (INDEPENDENT_AMBULATORY_CARE_PROVIDER_SITE_OTHER): Payer: Federal, State, Local not specified - PPO | Admitting: Ophthalmology

## 2017-07-17 DIAGNOSIS — H43813 Vitreous degeneration, bilateral: Secondary | ICD-10-CM | POA: Diagnosis not present

## 2017-07-17 DIAGNOSIS — I10 Essential (primary) hypertension: Secondary | ICD-10-CM

## 2017-07-17 DIAGNOSIS — H35033 Hypertensive retinopathy, bilateral: Secondary | ICD-10-CM

## 2017-07-17 DIAGNOSIS — H353231 Exudative age-related macular degeneration, bilateral, with active choroidal neovascularization: Secondary | ICD-10-CM | POA: Diagnosis not present

## 2017-07-24 ENCOUNTER — Encounter: Payer: Self-pay | Admitting: Family Medicine

## 2017-07-24 ENCOUNTER — Ambulatory Visit: Payer: Federal, State, Local not specified - PPO | Admitting: Family Medicine

## 2017-07-24 VITALS — BP 177/70 | HR 72 | Temp 97.3°F | Ht 63.0 in | Wt 129.6 lb

## 2017-07-24 DIAGNOSIS — H5789 Other specified disorders of eye and adnexa: Secondary | ICD-10-CM

## 2017-07-24 NOTE — Progress Notes (Signed)
   HPI  Patient presents today for red eye.  Patient explains she has had to 5 days of bilateral eye itching and irritation.  5 days ago she was treated with eye injection for macular degeneration by her ophthalmologist.  This morning she woke up with her right eye matted shut. She states last night the itching was much worse than it had been for the last few days. She used some over-the-counter itching eyedrops which were effective last night.  She denies fever, chills, sweats or sick contacts  PMH: Smoking status noted ROS: Per HPI  Objective: BP (!) 177/70   Pulse 72   Temp (!) 97.3 F (36.3 C) (Oral)   Ht 5\' 3"  (1.6 m)   Wt 129 lb 9.6 oz (58.8 kg)   BMI 22.96 kg/m  Gen: NAD, alert, cooperative with exam HEENT: NCAT, PERRLA, EOMI, conjunctival injection of the right eye CV: RRR, good S1/S2, no murmur Resp: CTABL, no wheezes, non-labored Ext: No edema, warm Neuro: Alert and oriented, No gross deficits  Assessment and plan:  # Red eye Patient has red eye with irritation that started the day after her previous injections. Possible allergic conjunctivitis versus bacterial conjunctivitis Recommended that she see her ophthalmologist today given temporal relation to the injections. If she is unable to be seen today I would go ahead and cover with antibiotic drops, however I am confident that she will be seen today by her ophthalmologist.   Laroy Apple, MD [facility] 07/24/2017, 10:17 AM

## 2017-08-05 ENCOUNTER — Other Ambulatory Visit: Payer: Self-pay | Admitting: Family Medicine

## 2017-08-16 ENCOUNTER — Encounter (INDEPENDENT_AMBULATORY_CARE_PROVIDER_SITE_OTHER): Payer: Federal, State, Local not specified - PPO | Admitting: Ophthalmology

## 2017-08-16 DIAGNOSIS — H43813 Vitreous degeneration, bilateral: Secondary | ICD-10-CM | POA: Diagnosis not present

## 2017-08-16 DIAGNOSIS — I10 Essential (primary) hypertension: Secondary | ICD-10-CM

## 2017-08-16 DIAGNOSIS — H35033 Hypertensive retinopathy, bilateral: Secondary | ICD-10-CM | POA: Diagnosis not present

## 2017-08-16 DIAGNOSIS — H353231 Exudative age-related macular degeneration, bilateral, with active choroidal neovascularization: Secondary | ICD-10-CM

## 2017-08-31 ENCOUNTER — Other Ambulatory Visit: Payer: Self-pay | Admitting: Family Medicine

## 2017-09-13 ENCOUNTER — Encounter (INDEPENDENT_AMBULATORY_CARE_PROVIDER_SITE_OTHER): Payer: Federal, State, Local not specified - PPO | Admitting: Ophthalmology

## 2017-09-13 DIAGNOSIS — H353231 Exudative age-related macular degeneration, bilateral, with active choroidal neovascularization: Secondary | ICD-10-CM

## 2017-09-13 DIAGNOSIS — H43813 Vitreous degeneration, bilateral: Secondary | ICD-10-CM | POA: Diagnosis not present

## 2017-09-13 DIAGNOSIS — H35033 Hypertensive retinopathy, bilateral: Secondary | ICD-10-CM

## 2017-09-13 DIAGNOSIS — I10 Essential (primary) hypertension: Secondary | ICD-10-CM

## 2017-09-30 ENCOUNTER — Other Ambulatory Visit: Payer: Self-pay | Admitting: Family Medicine

## 2017-10-16 ENCOUNTER — Encounter (INDEPENDENT_AMBULATORY_CARE_PROVIDER_SITE_OTHER): Payer: Federal, State, Local not specified - PPO | Admitting: Ophthalmology

## 2017-10-16 DIAGNOSIS — I10 Essential (primary) hypertension: Secondary | ICD-10-CM | POA: Diagnosis not present

## 2017-10-16 DIAGNOSIS — H43813 Vitreous degeneration, bilateral: Secondary | ICD-10-CM | POA: Diagnosis not present

## 2017-10-16 DIAGNOSIS — H353231 Exudative age-related macular degeneration, bilateral, with active choroidal neovascularization: Secondary | ICD-10-CM

## 2017-10-16 DIAGNOSIS — H35033 Hypertensive retinopathy, bilateral: Secondary | ICD-10-CM

## 2017-10-22 ENCOUNTER — Other Ambulatory Visit: Payer: Self-pay | Admitting: Family Medicine

## 2017-11-09 ENCOUNTER — Ambulatory Visit: Payer: Federal, State, Local not specified - PPO | Admitting: Family Medicine

## 2017-11-09 ENCOUNTER — Encounter: Payer: Self-pay | Admitting: Family Medicine

## 2017-11-09 VITALS — BP 178/61 | HR 66 | Temp 96.9°F | Ht 63.0 in | Wt 128.0 lb

## 2017-11-09 DIAGNOSIS — R011 Cardiac murmur, unspecified: Secondary | ICD-10-CM | POA: Diagnosis not present

## 2017-11-09 DIAGNOSIS — E039 Hypothyroidism, unspecified: Secondary | ICD-10-CM | POA: Diagnosis not present

## 2017-11-09 DIAGNOSIS — I10 Essential (primary) hypertension: Secondary | ICD-10-CM

## 2017-11-09 MED ORDER — AMLODIPINE BESYLATE 10 MG PO TABS: 10.0000 mg | ORAL_TABLET | Freq: Every day | ORAL | 1 refills | Status: DC

## 2017-11-09 NOTE — Patient Instructions (Addendum)
I would recommend only using the Triamcinolone for about 7-10 days  Per flare.    Your blood pressure was persistently elevated. Given your history of TIA, I have increased your blood pressure medication amlodipine to 10 mg daily.  You may double up on the 5 mg tablets until you are done with your current bottle and then switch over to the new strength that would be 1 tablet a day.  See me back in 1 month for recheck of your blood pressure.  You had labs performed today.  You will be contacted with the results of the labs once they are available, usually in the next 3 business days for routine lab work.   You had a very soft heart murmur today.  I read your last echocardiogram from 2015 and it appears that you had mild stenosis and mild regurgitation of your aortic valve.  This is likely the explanation for this heart murmur.  If you develop symptoms, we should consider having this rechecked.   Heart Murmur A heart murmur is an extra sound that is caused by chaotic blood flow. The murmur can be heard as a "hum" or "whoosh" sound when blood flows through the heart. The heart has four areas called chambers. Valves separate the upper and lower chambers from each other (tricuspid valve and mitral valve) and separate the lower chambers of the heart from pathways that lead away from the heart (aortic valve and pulmonary valve). Normally, the valves open to let blood flow through or out of your heart, and then they shut to keep the blood from flowing backward. There are two types of heart murmurs:  Innocent murmurs. Most people with this type of heart murmur do not have a heart problem. Many children have innocent heart murmurs. Your health care provider may suggest some basic testing to find out whether your murmur is an innocent murmur. If an innocent heart murmur is found, there is no need for further tests or treatment and no need to restrict activities or stop playing sports.  Abnormal murmurs. These  types of murmurs can occur in children and adults. Abnormal murmurs may be a sign of a more serious heart condition, such as a heart defect present at birth (congenital defect) or heart valve disease.  What are the causes? This condition is caused by heart valves that are not working properly. In children, abnormal heart murmurs are typically caused by congenital defects. In adults, abnormal murmurs are usually from heart valve problems caused by disease, infection, or aging. Three types of heart valve defects can cause a murmur:  Regurgitation. This is when blood leaks back through the valve in the wrong direction.  Mitral valve prolapse. This is when the mitral valve of the heart has a loose flap and does not close tightly.  Stenosis. This is when a valve does not open enough and blocks blood flow.  This condition may also be caused by:  Pregnancy.  Fever.  Overactive thyroid gland.  Anemia.  Exercise.  Rapid growth spurts (in children).  What are the signs or symptoms? Innocent murmurs do not cause symptoms, and many people with abnormal murmurs may or may not have symptoms. If symptoms do develop, they may include:  Shortness of breath.  Blue coloring of the skin, especially on the fingertips.  Chest pain.  Palpitations, or feeling a fluttering or skipped heartbeat.  Fainting.  Persistent cough.  Getting tired much faster than expected.  Swelling in the abdomen, feet, or ankles.  How is this diagnosed? This condition may be diagnosed during a routine physical or other exam. If your health care provider hears a murmur with a stethoscope, he or she will listen for:  Where the murmur is located in your heart.  How long the murmur lasts (duration).  When the murmur is heard during the heartbeat.  How loud the murmur is. This may help the health care provider figure out what is causing the murmur.  You may be referred to a heart specialist (cardiologist). You may  also have other tests, including:  Electrocardiogram (ECG or EKG). This test measures the electrical activity of your heart.  Echocardiogram. This test uses high frequency sound waves to make pictures of your heart.  MRI or chest X-ray.  Cardiac catheterization. This test looks at blood flow through the heart.  For children and adults who have an abnormal heart murmur and want to stay active, it is important to complete testing, review test results, and receive recommendations from your health care provider. If heart disease is present, it may not be safe to play or be active. How is this treated? Heart murmurs themselves do not need treatment. In some cases, a heart murmur may go away on its own. If an underlying problem or disease is causing the murmur, you may need treatment. If treatment is needed, it will depend on the type and severity of the disease or heart problem causing the murmur. Treatment may include:  Medicine.  Surgery.  Dietary and lifestyle changes.  Follow these instructions at home:  Talk with your health care provider before participating in sports or other activities that require a lot of effort and energy (are strenuous).  Learn as much as possible about your condition and any related diseases. Ask your health care provider if you may at risk for any medical emergencies.  Talk with your health care provider about what symptoms you should look out for.  It is up to you to get your test results. Ask your health care provider, or the department that is doing the test, when your results will be ready.  Keep all follow-up visits as told by your health care provider. This is important. Contact a health care provider if:  You feel light-headed.  You are frequently short of breath.  You feel more tired than usual.  You are having a hard time keeping up with normal activities or fitness routines.  You have swelling in your ankles or feet.  You have chest  pain.  You notice that your heart often beats irregularly.  You develop any new symptoms. Get help right away if:  You develop severe chest pain.  You are having trouble breathing.  You have fainting spells.  Your symptoms suddenly get worse. These symptoms may represent a serious problem that is an emergency. Do not wait to see if the symptoms will go away. Get medical help right away. Call your local emergency services (911 in the [place] Do not drive yourself to the hospital. Summary  Normally, the heart valves open to let blood flow through or out of your heart, and then they shut to keep the blood from flowing backward.  Heart murmur is caused by heart valves that are not working properly.  You may need treatment if an underlying problem or disease is causing the heart murmur. Treatment may include medicine, surgery, or dietary and lifestyle changes.  Talk with your health care provider before participating in sports or other activities that require  a lot of effort and energy (are strenuous).  Talk with your health care provider about what symptoms you should watch out for. This information is not intended to replace advice given to you by your health care provider. Make sure you discuss any questions you have with your health care provider. Document Released: 03/30/2004 Document Revised: 02/09/2016 Document Reviewed: 02/09/2016 Elsevier Interactive Patient Education  [employer].

## 2017-11-09 NOTE — Progress Notes (Signed)
Subjective: CC: HTN PCP: Timmothy Euler, MD Jenna Ramos is a 84 y.o. female presenting to clinic today for:  1. Hypertension Patient reports Blood pressure at home: 130/60-70; Meds: Compliant with Norvasc 5mg  and Benazapril 20, Side effects: none  ROS: Denies headache, dizziness, visual changes, nausea, vomiting, chest pain, LE swelling, abdominal pain or shortness of breath.  2. Hypothyroidism Reports being diagnosed in her 78s.  She reports compliance with her Synthroid 75 mcg daily.  Denies any heart palpitations, tremors, diarrhea.  She does report chronic constipation.   ROS: Per HPI  Allergies  Allergen Reactions  . Aggrenox [Aspirin-Dipyridamole Er] Anaphylaxis    Severe headache  . Codeine Other (See Comments)    Feet burn and agitation  . Morphine And Related Other (See Comments)    Feet burn and agitation   Past Medical History:  Diagnosis Date  . Anemia    takes Iron pill daily  . Arthritis   . Carotid artery occlusion   . Fibromyalgia   . Fibromyalgia   . Glaucoma    uses Eye Drops daily  . HOH (hard of hearing)   . Hyperlipidemia    takes Atorvastatin daily  . Hypertension    takes Benazepril daily  . Hypothyroidism    takes Synthroid daily  . Macular degeneration    wet and gets injections in both eyes  . Nocturia   . Peripheral vascular disease ([place])   . Rectal incontinence   . Stroke [facility])     Current Outpatient Medications:  .  amLODipine (NORVASC) 5 MG tablet, TAKE 1 TABLET BY MOUTH EVERY DAY, Disp: 90 tablet, Rfl: 1 .  aspirin 325 MG EC tablet, Take 325 mg by mouth., Disp: , Rfl:  .  benazepril (LOTENSIN) 20 MG tablet, TAKE 1 TABLET BY MOUTH EVERY DAY, Disp: 90 tablet, Rfl: 1 .  BESIVANCE 0.6 % SUSP, Place 1 drop into both eyes See admin instructions. Take day of eye shots and the day after, Disp: , Rfl:  .  bimatoprost (LUMIGAN) 0.03 % ophthalmic solution, Place 1 drop into both eyes at bedtime., Disp: , Rfl:  .  Calcium  Carbonate-Vitamin D (CALCIUM 600/VITAMIN D) 600-400 MG-UNIT per tablet, Take 1 tablet by mouth daily. , Disp: , Rfl:  .  iron polysaccharides (FERREX 150) 150 MG capsule, Take 1 capsule (150 mg total) by mouth daily., Disp: 60 capsule, Rfl: 3 .  levothyroxine (SYNTHROID, LEVOTHROID) 75 MCG tablet, TAKE 1 TABLET (75 MCG TOTAL) BY MOUTH DAILY BEFORE BREAKFAST., Disp: 90 tablet, Rfl: 2 .  Multiple Vitamins-Minerals (MULTIVITAMINS THER. W/MINERALS) TABS, Take 1 tablet by mouth daily., Disp: , Rfl:  .  Omega-3 Fatty Acids (FISH OIL) 1200 MG CAPS, Take 1 capsule by mouth daily. , Disp: , Rfl:  .  triamcinolone cream (KENALOG) 0.1 %, Apply 1 application topically 2 (two) times daily., Disp: 30 g, Rfl: 0 Social History   Socioeconomic History  . Marital status: Widowed    Spouse name: Not on file  . Number of children: Not on file  . Years of education: Not on file  . Highest education level: Not on file  Occupational History  . Not on file  Social Needs  . Financial resource strain: Not on file  . Food insecurity:    Worry: Not on file    Inability: Not on file  . Transportation needs:    Medical: Not on file    Non-medical: Not on file  Tobacco Use  .  Smoking status: Former Research scientist (life sciences)  . Smokeless tobacco: Never Used  . Tobacco comment: quit smoking 7yrs ago  Substance and Sexual Activity  . Alcohol use: No    Alcohol/week: 0.0 standard drinks  . Drug use: No  . Sexual activity: Yes    Birth control/protection: Post-menopausal  Lifestyle  . Physical activity:    Days per week: Not on file    Minutes per session: Not on file  . Stress: Not on file  Relationships  . Social connections:    Talks on phone: Not on file    Gets together: Not on file    Attends religious service: Not on file    Active member of club or organization: Not on file    Attends meetings of clubs or organizations: Not on file    Relationship status: Not on file  . Intimate partner violence:    Fear of  current or ex partner: Not on file    Emotionally abused: Not on file    Physically abused: Not on file    Forced sexual activity: Not on file  Other Topics Concern  . Not on file  Social History Narrative  . Not on file   Family History  Problem Relation Age of Onset  . Prostate cancer Father   . Cancer Father   . Lung disease Sister   . Cancer Brother     Objective: Office vital signs reviewed. BP (!) 178/61   Pulse 66   Temp (!) 96.9 F (36.1 C) (Oral)   Ht 5\' 3"  (1.6 m)   Wt 128 lb (58.1 kg)   BMI 22.67 kg/m   Physical Examination:  General: Awake, alert, well nourished, No acute distress HEENT: Normal, sclera white, MMM Cardio: regular rate and rhythm, S1S2 heard, soft systolic murmur appreciated at RSB Pulm: clear to auscultation bilaterally, no wheezes, rhonchi or rales; normal work of breathing on room air Extremities: warm, well perfused, No edema, cyanosis or clubbing; +2 pulses bilaterally MSK: normal gait and normal station Skin: dry; intact; no rashes or lesions Neuro: no resting tremor   Assessment/ Plan: 84 y.o. female   1. Essential hypertension Blood pressure not controlled on today's visit.  This was rechecked electronically and manually.  I am somewhat confounded by her reports of normal blood pressures at home.  She reportedly had her cuff tested against our blood pressure cuff about a year ago and it seemed consistent.  I recommended that she increase the Norvasc to 10 mg daily.  A new prescription has been sent.  Continue benazepril.  Monitor blood pressures daily after taking blood pressure medications.  Goal blood pressure less than 150/90, ideally less than 140/90 given history of TIA.  She is to contact me if she is having any low blood pressures.  She will follow-up in the next 4 weeks for blood pressure recheck and medication management. - Basic Metabolic Panel  2. Hypothyroidism, unspecified type Check TSH - TSH  3. Newly recognized heart  murmur Asymptomatic.  Soft systolic heart murmur heard at the right sternal border on today's exam.  I reviewed her echocardiogram from 2015 and apparently she had mild aortic stenosis and regurgitation on that exam.  We discussed that we could consider repeating this at some point, particularly if she becomes symptomatic.  For now we will monitor closely.   Orders Placed This Encounter  Procedures  . Basic Metabolic Panel  . TSH   Meds ordered this encounter  Medications  . amLODipine (  NORVASC) 10 MG tablet    Sig: Take 1 tablet (10 mg total) by mouth daily.    Dispense:  90 tablet    Refill:  [place], [place] [phone]

## 2017-11-10 LAB — BASIC METABOLIC PANEL
BUN/Creatinine Ratio: 24 (ref 12–28)
BUN: 19 mg/dL (ref 8–27)
CALCIUM: 10.1 mg/dL (ref 8.7–10.3)
CHLORIDE: 104 mmol/L (ref 96–106)
CO2: 25 mmol/L (ref 20–29)
Creatinine, Ser: 0.8 mg/dL (ref 0.57–1.00)
GFR calc Af Amer: 77 mL/min/{1.73_m2} (ref >59)
GFR calc non Af Amer: 67 mL/min/{1.73_m2} (ref >59)
GLUCOSE: 112 mg/dL — AB (ref 65–99)
POTASSIUM: 4.2 mmol/L (ref 3.5–5.2)
Sodium: 144 mmol/L (ref 134–144)

## 2017-11-10 LAB — TSH: TSH: 0.624 u[IU]/mL (ref 0.450–4.500)

## 2017-11-15 ENCOUNTER — Encounter (INDEPENDENT_AMBULATORY_CARE_PROVIDER_SITE_OTHER): Payer: Federal, State, Local not specified - PPO | Admitting: Ophthalmology

## 2017-11-15 DIAGNOSIS — H35033 Hypertensive retinopathy, bilateral: Secondary | ICD-10-CM

## 2017-11-15 DIAGNOSIS — H43813 Vitreous degeneration, bilateral: Secondary | ICD-10-CM | POA: Diagnosis not present

## 2017-11-15 DIAGNOSIS — H353231 Exudative age-related macular degeneration, bilateral, with active choroidal neovascularization: Secondary | ICD-10-CM

## 2017-11-15 DIAGNOSIS — I10 Essential (primary) hypertension: Secondary | ICD-10-CM | POA: Diagnosis not present

## 2017-12-03 ENCOUNTER — Other Ambulatory Visit: Payer: Self-pay

## 2017-12-03 ENCOUNTER — Observation Stay (HOSPITAL_COMMUNITY): Payer: Federal, State, Local not specified - PPO

## 2017-12-03 ENCOUNTER — Encounter (HOSPITAL_COMMUNITY): Payer: Self-pay | Admitting: Emergency Medicine

## 2017-12-03 ENCOUNTER — Telehealth: Payer: Self-pay | Admitting: Family Medicine

## 2017-12-03 ENCOUNTER — Emergency Department (HOSPITAL_COMMUNITY): Payer: Federal, State, Local not specified - PPO

## 2017-12-03 ENCOUNTER — Observation Stay (HOSPITAL_COMMUNITY)
Admission: EM | Admit: 2017-12-03 | Discharge: 2017-12-04 | Disposition: A | Discharge status: Home / Self Care | Payer: Federal, State, Local not specified - PPO | Attending: Internal Medicine | Admitting: Internal Medicine

## 2017-12-03 DIAGNOSIS — I739 Peripheral vascular disease, unspecified: Secondary | ICD-10-CM | POA: Insufficient documentation

## 2017-12-03 DIAGNOSIS — E039 Hypothyroidism, unspecified: Secondary | ICD-10-CM | POA: Insufficient documentation

## 2017-12-03 DIAGNOSIS — Z79899 Other long term (current) drug therapy: Secondary | ICD-10-CM | POA: Insufficient documentation

## 2017-12-03 DIAGNOSIS — Z886 Allergy status to analgesic agent status: Secondary | ICD-10-CM | POA: Diagnosis not present

## 2017-12-03 DIAGNOSIS — H409 Unspecified glaucoma: Secondary | ICD-10-CM | POA: Diagnosis not present

## 2017-12-03 DIAGNOSIS — M797 Fibromyalgia: Secondary | ICD-10-CM | POA: Diagnosis not present

## 2017-12-03 DIAGNOSIS — R2689 Other abnormalities of gait and mobility: Secondary | ICD-10-CM | POA: Diagnosis not present

## 2017-12-03 DIAGNOSIS — R200 Anesthesia of skin: Secondary | ICD-10-CM | POA: Diagnosis not present

## 2017-12-03 DIAGNOSIS — D509 Iron deficiency anemia, unspecified: Secondary | ICD-10-CM | POA: Diagnosis not present

## 2017-12-03 DIAGNOSIS — G459 Transient cerebral ischemic attack, unspecified: Principal | ICD-10-CM | POA: Insufficient documentation

## 2017-12-03 DIAGNOSIS — R0789 Other chest pain: Secondary | ICD-10-CM | POA: Diagnosis present

## 2017-12-03 DIAGNOSIS — Z885 Allergy status to narcotic agent status: Secondary | ICD-10-CM | POA: Diagnosis not present

## 2017-12-03 DIAGNOSIS — Z7982 Long term (current) use of aspirin: Secondary | ICD-10-CM | POA: Insufficient documentation

## 2017-12-03 DIAGNOSIS — R202 Paresthesia of skin: Secondary | ICD-10-CM | POA: Diagnosis present

## 2017-12-03 DIAGNOSIS — E611 Iron deficiency: Secondary | ICD-10-CM

## 2017-12-03 DIAGNOSIS — E785 Hyperlipidemia, unspecified: Secondary | ICD-10-CM | POA: Diagnosis not present

## 2017-12-03 DIAGNOSIS — R7303 Prediabetes: Secondary | ICD-10-CM | POA: Diagnosis not present

## 2017-12-03 DIAGNOSIS — I10 Essential (primary) hypertension: Secondary | ICD-10-CM | POA: Insufficient documentation

## 2017-12-03 DIAGNOSIS — Z87891 Personal history of nicotine dependence: Secondary | ICD-10-CM | POA: Diagnosis not present

## 2017-12-03 LAB — COMPREHENSIVE METABOLIC PANEL
ALK PHOS: 59 U/L (ref 38–126)
ALT: 21 U/L (ref 0–44)
AST: 23 U/L (ref 15–41)
Albumin: 4.6 g/dL (ref 3.5–5.0)
Anion gap: 12 (ref 5–15)
BILIRUBIN TOTAL: 0.6 mg/dL (ref 0.3–1.2)
BUN: 17 mg/dL (ref 8–23)
CO2: 25 mmol/L (ref 22–32)
Calcium: 10.2 mg/dL (ref 8.9–10.3)
Chloride: 105 mmol/L (ref 98–111)
Creatinine, Ser: 0.86 mg/dL (ref 0.44–1.00)
GFR calc Af Amer: >60 mL/min (ref >60)
GFR, EST NON AFRICAN AMERICAN: 59 mL/min — AB (ref >60)
GLUCOSE: 117 mg/dL — AB (ref 70–99)
Potassium: 3.6 mmol/L (ref 3.5–5.1)
Sodium: 142 mmol/L (ref 135–145)
TOTAL PROTEIN: 8.4 g/dL — AB (ref 6.5–8.1)

## 2017-12-03 LAB — I-STAT CHEM 8, ED
BUN: 21 mg/dL (ref 8–23)
CHLORIDE: 106 mmol/L (ref 98–111)
Calcium, Ion: 1.18 mmol/L (ref 1.15–1.40)
Creatinine, Ser: 0.8 mg/dL (ref 0.44–1.00)
GLUCOSE: 120 mg/dL — AB (ref 70–99)
HCT: 41 % (ref 36.0–46.0)
HEMOGLOBIN: 13.9 g/dL (ref 12.0–15.0)
POTASSIUM: 3.8 mmol/L (ref 3.5–5.1)
SODIUM: 141 mmol/L (ref 135–145)
TCO2: 26 mmol/L (ref 22–32)

## 2017-12-03 LAB — DIFFERENTIAL
Abs Immature Granulocytes: 0 10*3/uL (ref 0.0–0.1)
Basophils Absolute: 0.1 10*3/uL (ref 0.0–0.1)
Basophils Relative: 1 %
EOS ABS: 0.1 10*3/uL (ref 0.0–0.7)
Eosinophils Relative: 1 %
Immature Granulocytes: 0 %
LYMPHS ABS: 1.8 10*3/uL (ref 0.7–4.0)
LYMPHS PCT: 19 %
MONOS PCT: 8 %
Monocytes Absolute: 0.8 10*3/uL (ref 0.1–1.0)
NEUTROS ABS: 6.9 10*3/uL (ref 1.7–7.7)
Neutrophils Relative %: 71 %

## 2017-12-03 LAB — CBC
HCT: 42.5 % (ref 36.0–46.0)
Hemoglobin: 13.2 g/dL (ref 12.0–15.0)
MCH: 27.6 pg (ref 26.0–34.0)
MCHC: 31.1 g/dL (ref 30.0–36.0)
MCV: 88.7 fL (ref 78.0–100.0)
PLATELETS: 342 10*3/uL (ref 150–400)
RBC: 4.79 MIL/uL (ref 3.87–5.11)
RDW: 14.1 % (ref 11.5–15.5)
WBC: 9.6 10*3/uL (ref 4.0–10.5)

## 2017-12-03 LAB — PROTIME-INR
INR: 1.07
PROTHROMBIN TIME: 13.8 s (ref 11.4–15.2)

## 2017-12-03 LAB — CBG MONITORING, ED: GLUCOSE-CAPILLARY: 105 mg/dL — AB (ref 70–99)

## 2017-12-03 LAB — I-STAT TROPONIN, ED: Troponin i, poc: 0 ng/mL (ref 0.00–0.08)

## 2017-12-03 LAB — APTT: aPTT: 31 seconds (ref 24–36)

## 2017-12-03 MED ORDER — ONDANSETRON HCL 4 MG/2ML IJ SOLN: 4.0000 mg | Freq: Three times a day (TID) | INTRAMUSCULAR | Status: DC | PRN

## 2017-12-03 MED ORDER — ZOLPIDEM TARTRATE 5 MG PO TABS: 5.0000 mg | ORAL_TABLET | Freq: Every evening | ORAL | Status: DC | PRN

## 2017-12-03 MED ORDER — SENNOSIDES-DOCUSATE SODIUM 8.6-50 MG PO TABS: 1.0000 | ORAL_TABLET | Freq: Every evening | ORAL | Status: DC | PRN

## 2017-12-03 MED ORDER — NITROGLYCERIN 0.4 MG SL SUBL: 0.4000 mg | SUBLINGUAL_TABLET | SUBLINGUAL | Status: DC | PRN

## 2017-12-03 MED ORDER — HYDRALAZINE HCL 20 MG/ML IJ SOLN: 5.0000 mg | INTRAMUSCULAR | Status: DC | PRN

## 2017-12-03 MED ORDER — LATANOPROST 0.005 % OP SOLN
1.0000 [drp] | Freq: Every day | OPHTHALMIC | Status: DC
  Administered 2017-12-03: 1 [drp] via OPHTHALMIC
  Filled 2017-12-03: qty 2.5

## 2017-12-03 MED ORDER — ENOXAPARIN SODIUM 40 MG/0.4ML ~~LOC~~ SOLN
40.0000 mg | Freq: Every day | SUBCUTANEOUS | Status: DC
  Administered 2017-12-03: 40 mg via SUBCUTANEOUS
  Filled 2017-12-03: qty 0.4

## 2017-12-03 MED ORDER — POLYSACCHARIDE IRON COMPLEX 150 MG PO CAPS
150.0000 mg | ORAL_CAPSULE | Freq: Every day | ORAL | Status: DC
  Administered 2017-12-04: 150 mg via ORAL
  Filled 2017-12-03: qty 1

## 2017-12-03 MED ORDER — AMLODIPINE BESYLATE 10 MG PO TABS
10.0000 mg | ORAL_TABLET | Freq: Every day | ORAL | Status: DC
  Administered 2017-12-04: 10 mg via ORAL
  Filled 2017-12-03: qty 1

## 2017-12-03 MED ORDER — STROKE: EARLY STAGES OF RECOVERY BOOK
Freq: Once | Status: AC
  Administered 2017-12-03
  Filled 2017-12-03 (×2): qty 1

## 2017-12-03 MED ORDER — ASPIRIN EC 325 MG PO TBEC
325.0000 mg | DELAYED_RELEASE_TABLET | Freq: Every day | ORAL | Status: DC
  Administered 2017-12-04: 325 mg via ORAL
  Filled 2017-12-03: qty 1

## 2017-12-03 MED ORDER — TRIAMCINOLONE ACETONIDE 0.1 % EX CREA
1.0000 "application " | TOPICAL_CREAM | Freq: Two times a day (BID) | CUTANEOUS | Status: DC | PRN
  Filled 2017-12-03: qty 15

## 2017-12-03 MED ORDER — ATORVASTATIN CALCIUM 40 MG PO TABS
40.0000 mg | ORAL_TABLET | Freq: Every day | ORAL | Status: DC
  Administered 2017-12-03: 40 mg via ORAL
  Filled 2017-12-03: qty 1

## 2017-12-03 MED ORDER — ADULT MULTIVITAMIN W/MINERALS CH
1.0000 | ORAL_TABLET | Freq: Every day | ORAL | Status: DC
  Administered 2017-12-04: 1 via ORAL
  Filled 2017-12-03: qty 1

## 2017-12-03 MED ORDER — ACETAMINOPHEN 650 MG RE SUPP: 650.0000 mg | RECTAL | Status: DC | PRN

## 2017-12-03 MED ORDER — BENAZEPRIL HCL 20 MG PO TABS
20.0000 mg | ORAL_TABLET | Freq: Every day | ORAL | Status: DC
  Administered 2017-12-04: 20 mg via ORAL
  Filled 2017-12-03: qty 1

## 2017-12-03 MED ORDER — LEVOTHYROXINE SODIUM 75 MCG PO TABS: 75.0000 ug | ORAL_TABLET | Freq: Every day | ORAL | Status: DC

## 2017-12-03 MED ORDER — OMEGA-3-ACID ETHYL ESTERS 1 G PO CAPS
1.0000 g | ORAL_CAPSULE | Freq: Every day | ORAL | Status: DC
  Administered 2017-12-04: 1 g via ORAL
  Filled 2017-12-03: qty 1

## 2017-12-03 MED ORDER — ACETAMINOPHEN 325 MG PO TABS
650.0000 mg | ORAL_TABLET | ORAL | Status: DC | PRN
  Administered 2017-12-04: 650 mg via ORAL
  Filled 2017-12-03: qty 2

## 2017-12-03 MED ORDER — ACETAMINOPHEN 160 MG/5ML PO SOLN: 650.0000 mg | ORAL | Status: DC | PRN

## 2017-12-03 MED ORDER — SODIUM CHLORIDE 0.9 % IV SOLN
INTRAVENOUS | Status: DC
  Administered 2017-12-03: 21:00:00 via INTRAVENOUS

## 2017-12-03 MED ORDER — CALCIUM CARBONATE-VITAMIN D 500-200 MG-UNIT PO TABS
1.0000 | ORAL_TABLET | Freq: Every day | ORAL | Status: DC
  Filled 2017-12-03: qty 1

## 2017-12-03 NOTE — Telephone Encounter (Signed)
PT feels fuzzy all over, head feels weird, when she talks like she is talking into a vacuum been like this for hour. Denies SOB, chest pain. Transferring call to Triage.

## 2017-12-03 NOTE — ED Provider Notes (Addendum)
[facility] EMERGENCY DEPARTMENT Provider Note   CSN: [id number] Arrival date & time: 12/03/17  1548     History   Chief Complaint Chief Complaint  Patient presents with  . Tingling  . Weakness    HPI Jenna Ramos is a 84 y.o. female.  HPI  Patient is 84yo female with PMHx of HTN, HLD, TIA, hypothyroidism, and carotid artery occlusion s/p b/l endarterectomy who presents with intermittent whole body heaviness x 2-3 days.  Episodes resolve spontaneously however today she noted "breast heaviness and foggy head."  She felt "jittery" and had left arm tingling and right lower leg tingling.  Episodes resolved spontaneously and lasted several hours.  She is asymptomatic currently.  She reports symptoms worsened upon standing.  No alleviating or aggravating factors.  Daughter at bedside notes pedal and hand edema is new.    Patients denies CP, SOB, N/V/D, abdominal pain, urinary complaints, focal weakness, vision changes, and difficulty swallowing.    Past Medical History:  Diagnosis Date  . Anemia    takes Iron pill daily  . Arthritis   . Carotid artery occlusion   . Fibromyalgia   . Fibromyalgia   . Glaucoma    uses Eye Drops daily  . HOH (hard of hearing)   . Hyperlipidemia    takes Atorvastatin daily  . Hypertension    takes Benazepril daily  . Hypothyroidism    takes Synthroid daily  . Macular degeneration    wet and gets injections in both eyes  . Nocturia   . Peripheral vascular disease ([place])   . Rectal incontinence   . Stroke [facility])     Patient Active Problem List   Diagnosis Date Noted  . Iron deficiency anemia 12/03/2017  . Numbness and tingling in left hand 12/03/2017  . Chest heaviness 12/03/2017  . Newly recognized heart murmur 11/09/2017  . Pre-diabetes 12/19/2016  . Hypothyroidism   . HLD (hyperlipidemia)   . Essential hypertension 05/04/2014  . Hip pain 03/31/2014  . TIA (transient ischemic attack) 01/23/2014  . Carotid  artery disease ([place]) 01/14/2014  . 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 Fibromyalgia muscle pain 10/28/2013  . Acute blood loss anemia 10/28/2013  . Hip fracture requiring operative repair ([place]) 10/17/2013  . Anemia     Past Surgical History:  Procedure Laterality Date  . APPENDECTOMY    . CAROTID ENDARTERECTOMY    . cataract surgery Bilateral   . COLONOSCOPY    . ENDARTERECTOMY Left 02/19/2014   Procedure: ENDARTERECTOMY CAROTID;  Surgeon: Conrad Heilwood, MD;  Location: [place];  Service: Vascular;  Laterality: Left;  . ESOPHAGOGASTRODUODENOSCOPY    . TONSILLECTOMY    . TOTAL HIP ARTHROPLASTY Left 10/2013  . TUBAL LIGATION    . WRIST SURGERY     pins and screws     OB History   None      Home Medications    Prior to Admission medications   Medication Sig Start Date End Date Taking? Authorizing Provider  Acetaminophen (TYLENOL PO) Take 1-2 tablets by mouth every 6 (six) hours as needed (pain/headache).   Yes [provider]  amLODipine (NORVASC) 10 MG tablet Take 1 tablet (10 mg total) by mouth daily. 11/09/17  Yes Ronnie Doss M, DO  aspirin 325 MG EC tablet Take 325 mg by mouth daily.    Yes [provider]  benazepril (LOTENSIN) 20 MG tablet TAKE 1 TABLET BY MOUTH EVERY DAY Patient taking  differently: Take 20 mg by mouth daily.  08/06/17  Yes Timmothy Euler, MD  Besifloxacin HCl (BESIVANCE) 0.6 % SUSP Place 1 drop into both eyes See admin instructions. Instill one drop into both eyes 4 times daily on the day of and the day after eye injections - once a month   Yes [provider]  bimatoprost (LUMIGAN) 0.01 % SOLN Place 1 drop into both eyes at bedtime.   Yes [provider]  Calcium Carbonate-Vitamin D (CALCIUM 600/VITAMIN D) 600-400 MG-UNIT per tablet Take 1 tablet by mouth daily with lunch.    Yes [provider]  iron polysaccharides (FERREX 150) 150 MG capsule Take 1 capsule (150 mg total)  by mouth daily. Patient taking differently: Take 150 mg by mouth daily before lunch.  06/12/16  Yes Timmothy Euler, MD  levothyroxine (SYNTHROID, LEVOTHROID) 75 MCG tablet TAKE 1 TABLET (75 MCG TOTAL) BY MOUTH DAILY BEFORE BREAKFAST. Patient taking differently: Take 75 mcg by mouth at bedtime.  10/01/17  Yes Ronnie Doss M, DO  Multiple Vitamin (MULTIVITAMIN WITH MINERALS) TABS tablet Take 1 tablet by mouth daily.   Yes [provider]  Omega-3 Fatty Acids (FISH OIL) 1200 MG CAPS Take 1,200 mg by mouth daily with supper.    Yes [provider]  triamcinolone cream (KENALOG) 0.1 % Apply 1 application topically 2 (two) times daily. Patient taking differently: Apply 1 application topically 2 (two) times daily as needed (scalp itching).  12/19/16  Yes Timmothy Euler, MD    Family History Family History  Problem Relation Age of Onset  . Prostate cancer Father   . Cancer Father   . Lung disease Sister   . Cancer Brother     Social History Social History   Tobacco Use  . Smoking status: Former Research scientist (life sciences)  . Smokeless tobacco: Never Used  . Tobacco comment: quit smoking 28yrs ago  Substance Use Topics  . Alcohol use: No    Alcohol/week: 0.0 standard drinks  . Drug use: No     Allergies   Aggrenox [aspirin-dipyridamole er]; Codeine; and Morphine and related   Review of Systems Review of Systems  Constitutional: Negative for chills and fever.  HENT: Negative for sore throat.   Eyes: Negative for pain and visual disturbance.  Respiratory: Negative for cough and shortness of breath.   Cardiovascular: Negative for chest pain and palpitations.  Gastrointestinal: Negative for abdominal pain, diarrhea, nausea and vomiting.  Genitourinary: Negative for dysuria and hematuria.  Musculoskeletal: Negative for arthralgias and back pain.  Skin: Negative for color change and rash.  Neurological: Positive for numbness (left arm and right leg). Negative for seizures,  syncope, speech difficulty and weakness.       +Heaviness  Psychiatric/Behavioral: Negative for confusion.  All other systems reviewed and are negative.    Physical Exam Updated Vital Signs BP (!) 149/60 (BP Location: Left Arm)   Pulse 68   Temp 99.2 F (37.3 C) (Oral)   Resp 12   Ht 5' 3.5" (1.613 m)   Wt 58.3 kg   SpO2 98%   BMI 22.41 kg/m   Physical Exam  Constitutional: She is oriented to person, place, and time. She appears well-developed and well-nourished. No distress.  HENT:  Head: Normocephalic and atraumatic.  Mouth/Throat: Oropharynx is clear and moist.  Eyes: Pupils are equal, round, and reactive to light. Conjunctivae and EOM are normal.  Neck: Normal range of motion. Neck supple.  Cardiovascular: Normal rate, regular rhythm and intact  distal pulses.  No murmur heard. Pulmonary/Chest: Effort normal and breath sounds normal. No respiratory distress.  Abdominal: Soft. She exhibits no distension. There is no tenderness. There is no guarding.  Musculoskeletal: Normal range of motion. She exhibits edema (trace pitting pedal bilaterally).  Neurological: She is alert and oriented to person, place, and time. She has normal strength. A sensory deficit (subjective right lower leg) is present. No cranial nerve deficit. Coordination and gait normal. GCS eye subscore is 4. GCS verbal subscore is 5. GCS motor subscore is 6.  Skin: Skin is warm and dry. Capillary refill takes less than 2 seconds. No rash noted.  Psychiatric: She has a normal mood and affect.  Nursing note and vitals reviewed.    ED Treatments / Results  Labs (all labs ordered are listed, but only abnormal results are displayed) Labs Reviewed  COMPREHENSIVE METABOLIC PANEL - Abnormal; Notable for the following components:      Result Value   Glucose, Bld 117 (*)    Total Protein 8.4 (*)    GFR calc non Af Amer 59 (*)    All other components within normal limits  LIPID PANEL - Abnormal; Notable for the  following components:   Cholesterol 260 (*)    Triglycerides 353 (*)    VLDL 71 (*)    LDL Cholesterol 140 (*)    All other components within normal limits  CREATININE, SERUM - Abnormal; Notable for the following components:   GFR calc non Af Amer 59 (*)    All other components within normal limits  CBG MONITORING, ED - Abnormal; Notable for the following components:   Glucose-Capillary 105 (*)    All other components within normal limits  I-STAT CHEM 8, ED - Abnormal; Notable for the following components:   Glucose, Bld 120 (*)    All other components within normal limits  URINE CULTURE  GRAM STAIN  PROTIME-INR  APTT  CBC  DIFFERENTIAL  TROPONIN I  VITAMIN B12  TSH  FOLATE  URINALYSIS, ROUTINE W REFLEX MICROSCOPIC  HEMOGLOBIN A1C  I-STAT TROPONIN, ED    EKG EKG Interpretation  Date/Time:  Monday December 03 2017 16:32:47 EDT Ventricular Rate:  81 PR Interval:  150 QRS Duration: 80 QT Interval:  390 QTC Calculation: 453 R Axis:   38 Text Interpretation:  Normal sinus rhythm Normal ECG Baseline wander No previous tracing Confirmed by Blanchie Dessert [id number]) on 12/03/2017 5:10:46 PM   Radiology Ct Head Wo Contrast  Result Date: 12/03/2017 CLINICAL DATA:  84 year old female with jittery feeling and tingling left arm with tightness in right leg. Initial encounter. EXAM: CT HEAD WITHOUT CONTRAST TECHNIQUE: Contiguous axial images were obtained from the base of the skull through the vertex without intravenous contrast. COMPARISON:  01/06/2014 head CT. FINDINGS: Brain: No intracranial hemorrhage or CT evidence of large acute infarct. Prominent chronic microvascular changes. Moderate to marked global atrophy. No intracranial mass lesion noted on this unenhanced exam. Vascular: Vascular calcifications. Skull: Negative. Sinuses/Orbits: Post lens replacement. No acute orbital abnormality. Minimal mucosal thickening ethmoid sinus air cells. Remainder of visualized paranasal  sinuses are clear. Other: Mastoid air cells and middle ear cavities are clear. IMPRESSION: 1. No acute intracranial abnormality noted. 2. Prominent chronic microvascular changes. This limits detection of small acute infarct. 3. Global atrophy. Electronically Signed   By: Genia Del M.D.   On: 12/03/2017 18:08   Mr Jeri Cos CX Contrast  Result Date: 12/04/2017 CLINICAL DATA:  Body heaviness yesterday, RIGHT leg tightness today.  Mental fogging with LEFT hand tingling for several hours. Symptoms now resolved. History of stroke, carotid artery occlusion hypertension and hyperlipidemia. EXAM: MRI HEAD WITHOUT AND WITH CONTRAST TECHNIQUE: Multiplanar, multiecho pulse sequences of the brain and surrounding structures were obtained without and with intravenous contrast. CONTRAST:  5.5 cc Gadavist COMPARISON:  CT HEAD December 03, 2017 MRI head January 05, 2014 FINDINGS: INTRACRANIAL CONTENTS: No reduced diffusion to suggest acute ischemia. Scattered chronic microhemorrhages associated with chronic hypertension. Mild advanced parietal atrophy. No hydrocephalus. Confluent supratentorial white matter FLAIR T2 hyperintensities. No suspicious parenchymal signal, masses, mass effect. Prominent basal ganglia perivascular spaces associated with chronic small vessel ischemic changes. Old LEFT thalamus to internal capsule infarct. No abnormal intraparenchymal or extra-axial enhancement. No abnormal extra-axial fluid collections. No extra-axial masses. VASCULAR: Normal major intracranial vascular flow voids present at skull base. SKULL AND UPPER CERVICAL SPINE: No abnormal sellar expansion. No suspicious calvarial bone marrow signal. Craniocervical junction maintained. SINUSES/ORBITS: Small bilateral mastoid effusions. Paranasal sinuses are well aerated.The included ocular globes and orbital contents are non-suspicious. Status post bilateral ocular lens implants. OTHER: None. IMPRESSION: 1. No acute intracranial process. 2.  Severe chronic small vessel ischemic changes. 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 3. Disproportionately advanced parietal atrophy associated with neuro degenerative syndromes. Electronically Signed   By: Elon Alas M.D.   On: 12/04/2017 00:57    Procedures Procedures (including critical care time)  Medications Ordered in ED Medications  aspirin EC tablet 325 mg (325 mg Oral Given 12/04/17 0910)  amLODipine (NORVASC) tablet 10 mg (10 mg Oral Given 12/04/17 0910)  benazepril (LOTENSIN) tablet 20 mg (20 mg Oral Given 12/04/17 0910)  levothyroxine (SYNTHROID, LEVOTHROID) tablet 75 mcg (has no administration in time range)  iron polysaccharides (NIFEREX) capsule 150 mg (has no administration in time range)  multivitamin with minerals tablet 1 tablet (1 tablet Oral Given 12/04/17 0910)  omega-3 acid ethyl esters (LOVAZA) capsule 1 g (1 g Oral Given 12/04/17 0910)  latanoprost (XALATAN) 0.005 % ophthalmic solution 1 drop (1 drop Both Eyes Given 12/03/17 2301)  triamcinolone cream (KENALOG) 0.1 % 1 application (has no administration in time range)  0.9 %  sodium chloride infusion ( Intravenous Transfusing/Transfer 12/03/17 2144)  atorvastatin (LIPITOR) tablet 40 mg (40 mg Oral Given 12/03/17 2300)  acetaminophen (TYLENOL) tablet 650 mg (has no administration in time range)    Or  acetaminophen (TYLENOL) solution 650 mg (has no administration in time range)    Or  acetaminophen (TYLENOL) suppository 650 mg (has no administration in time range)  senna-docusate (Senokot-S) tablet 1 tablet (has no administration in time range)  enoxaparin (LOVENOX) injection 40 mg (40 mg Subcutaneous Given 12/03/17 2300)  ondansetron (ZOFRAN) injection 4 mg (has no administration in time range)  hydrALAZINE (APRESOLINE) injection 5 mg (has no administration in time range)  zolpidem (AMBIEN) tablet 5 mg (has no administration in time range)  nitroGLYCERIN (NITROSTAT) SL tablet 0.4 mg (has no administration in  time range)  calcium-vitamin D (OSCAL WITH D) 500-200 MG-UNIT per tablet 1 tablet (has no administration in time range)   stroke: mapping our early stages of recovery book ( Does not apply Given 12/03/17 2338)  gadobutrol (GADAVIST) 1 MMOL/ML injection 7.5 mL (5.5 mLs Intravenous Contrast Given 12/04/17 0043)     Initial Impression / Assessment and Plan / ED Course  I have reviewed the triage vital signs and the nursing notes.  Pertinent labs & imaging results that were available during my care of the patient were reviewed  by me and considered in my medical decision making (see chart for details).     Patient is 84yo female with PMHx of HTN, HLD, TIA, hypothyroidism, and carotid artery occlusion s/p b/l endarterectomy who presents with intermittent whole body heaviness x 2-3 days along with LUE and RLE numbness.  On arrival she HDS in NAD.  Exam as above unremarkable without focal neurologic deficits.  CTH without acute intracranial abnormality.  Labs without toxic, metabolic, or infectious process to explain symptoms.  Troponin negative.  EKG shows NSR with a rate of 81 and no evidence of acute ischemic changes, abnormal intervals, or dysrhythmia.  Will need observation for TIA work-up and chest heaviness r/o.    Discussed case with hospitalist who will admit for further work-up.  Family updated at bedside who verbalized their understanding and agreement with plan.  Final Clinical Impressions(s) / ED Diagnoses   Final diagnoses:  Tingling    ED Discharge Orders    None           Fabian November, MD 12/04/17 [place], MD 12/04/17 1340

## 2017-12-03 NOTE — ED Notes (Signed)
Pt ambulatory with standby assist.

## 2017-12-03 NOTE — ED Triage Notes (Signed)
Pt presents feeling "jittery", tingling to L arm, tighness to R leg, and feeling "foggy" mentally; pt reports hx of TIA; pt denies speech changes, one sided weakness, vision changes; pt also states that symptoms have been intermittent over last few days, feeling "funny"

## 2017-12-03 NOTE — H&P (Addendum)
History and Physical    Jenna Ramos AST:[id number] DOB: Feb 28, 1929 DOA: 12/03/2017  Referring MD/NP/PA:   PCP: [name], DO   Patient coming from:  The patient is coming from home.  At baseline, pt is independent for most of ADL.   Chief Complaint: left hand tingling, abnormal feeling  HPI: Jenna Ramos is a 84 y.o. female with medical history significant of hypertension, hyperlipidemia, prediabetes, TIA, hypothyroidism, iron deficiency anemia, carotid artery occlusion (s/p of bilateral endarterectomy), fibromyalgia, HOH, who presents with left hand tingling, abnormal feeling  Patient states that she had a one episode of whole-body heavy feeling yesterday, which lasted for several hours, then resolved spontaneously.  She states that the heaviness include chest heaviness, but no CP. Today at about a 2 PM, she started feeling abnormal, including feeling "jittery", tighghness to R leg, and "foggy" mentally and left hand tingling/tingling, which lasted for several hours, currently resolved. Patient denies vision change or hearing loss.  No facial droop.  She seems to have mild difficulty speaking out on my interview.  Denies chest pain, shortness breath, cough, fever or chills.  No nausea, vomiting, diarrhea, abdominal pain.  Denies symptoms of UTI.  ED Course: pt was found to have WBC 9.6, INR 1.07, PTT 31, pending urinalysis, electrolytes renal function okay, temperature normal, no tachycardia, has tachypnea, oxygen saturation 97% on room air, CT head is negative for acute intracranial abnormalities.  Review of Systems:   General: no fevers, chills, no body weight gain, has poor appetite, has fatigue HEENT: no blurry vision, hearing changes or sore throat Respiratory: no dyspnea, coughing, wheezing CV: no chest pain, no palpitations GI: no nausea, vomiting, abdominal pain, diarrhea, constipation GU: no dysuria, burning on urination, increased urinary frequency,  hematuria  Ext: no leg edema Neuro: has left hand tingling, abnormal feeling Skin: no rash, no skin tear. MSK: No muscle spasm, no deformity, no limitation of range of movement in spin Heme: No easy bruising.  Travel history: No recent long distant travel.  Allergy:  Allergies  Allergen Reactions  . Aggrenox [Aspirin-Dipyridamole Er] Anaphylaxis    Severe headache  . Codeine Other (See Comments)    Feet burn and agitation  . Morphine And Related Other (See Comments)    Feet burn and agitation    Past Medical History:  Diagnosis Date  . Anemia    takes Iron pill daily  . Arthritis   . Carotid artery occlusion   . Fibromyalgia   . Fibromyalgia   . Glaucoma    uses Eye Drops daily  . HOH (hard of hearing)   . Hyperlipidemia    takes Atorvastatin daily  . Hypertension    takes Benazepril daily  . Hypothyroidism    takes Synthroid daily  . Macular degeneration    wet and gets injections in both eyes  . Nocturia   . Peripheral vascular disease ([place])   . Rectal incontinence   . Stroke [facility])     Past Surgical History:  Procedure Laterality Date  . APPENDECTOMY    . CAROTID ENDARTERECTOMY    . cataract surgery Bilateral   . COLONOSCOPY    . ENDARTERECTOMY Left 02/19/2014   Procedure: ENDARTERECTOMY CAROTID;  Surgeon: Conrad Eskridge, MD;  Location: [facility];  Service: Vascular;  Laterality: Left;  . ESOPHAGOGASTRODUODENOSCOPY    . TONSILLECTOMY    . TOTAL HIP ARTHROPLASTY Left 10/2013  . TUBAL LIGATION    . WRIST SURGERY     pins and  screws    Social History:  reports that she has quit smoking. She has never used smokeless tobacco. She reports that she does not drink alcohol or use drugs.  Family History:  Family History  Problem Relation Age of Onset  . Prostate cancer Father   . Cancer Father   . Lung disease Sister   . Cancer Brother      Prior to Admission medications   Medication Sig Start Date End Date Taking? Authorizing Provider  Acetaminophen (TYLENOL  PO) Take 1-2 tablets by mouth every 6 (six) hours as needed (pain/headache).   Yes [provider]  amLODipine (NORVASC) 10 MG tablet Take 1 tablet (10 mg total) by mouth daily. 11/09/17  Yes Ronnie Doss M, DO  aspirin 325 MG EC tablet Take 325 mg by mouth daily.    Yes [provider]  benazepril (LOTENSIN) 20 MG tablet TAKE 1 TABLET BY MOUTH EVERY DAY Patient taking differently: Take 20 mg by mouth daily.  08/06/17  Yes Timmothy Euler, MD  Besifloxacin HCl (BESIVANCE) 0.6 % SUSP Place 1 drop into both eyes See admin instructions. Instill one drop into both eyes 4 times daily on the day of and the day after eye injections - once a month   Yes [provider]  bimatoprost (LUMIGAN) 0.01 % SOLN Place 1 drop into both eyes at bedtime.   Yes [provider]  Calcium Carbonate-Vitamin D (CALCIUM 600/VITAMIN D) 600-400 MG-UNIT per tablet Take 1 tablet by mouth daily with lunch.    Yes [provider]  iron polysaccharides (FERREX 150) 150 MG capsule Take 1 capsule (150 mg total) by mouth daily. Patient taking differently: Take 150 mg by mouth daily before lunch.  06/12/16  Yes Timmothy Euler, MD  levothyroxine (SYNTHROID, LEVOTHROID) 75 MCG tablet TAKE 1 TABLET (75 MCG TOTAL) BY MOUTH DAILY BEFORE BREAKFAST. Patient taking differently: Take 75 mcg by mouth at bedtime.  10/01/17  Yes Ronnie Doss M, DO  Multiple Vitamin (MULTIVITAMIN WITH MINERALS) TABS tablet Take 1 tablet by mouth daily.   Yes [provider]  Omega-3 Fatty Acids (FISH OIL) 1200 MG CAPS Take 1,200 mg by mouth daily with supper.    Yes [provider]  triamcinolone cream (KENALOG) 0.1 % Apply 1 application topically 2 (two) times daily. Patient taking differently: Apply 1 application topically 2 (two) times daily as needed (scalp itching).  12/19/16  Yes Timmothy Euler, MD    Physical Exam: Vitals:   12/03/17 1830 12/03/17 1900 12/03/17 1930 12/03/17 1945    BP: (!) 163/62 (!) 159/67 (!) 170/75 (!) 176/68  Pulse: 69 78 76 77  Resp: 13 10 14 16   Temp:      TempSrc:      SpO2: 100% 100% 100% 100%  Weight:      Height:       General: Not in acute distress HEENT:       Eyes: PERRL, EOMI, no scleral icterus.       ENT: No discharge from the ears and nose, no pharynx injection, no tonsillar enlargement.        Neck: No JVD, no bruit, no mass felt. Heme: No neck lymph node enlargement. Cardiac: S1/S2, RRR, No murmurs, No gallops or rubs. Respiratory: No rales, wheezing, rhonchi or rubs. GI: Soft, nondistended, nontender, no rebound pain, no organomegaly, BS present. GU: No hematuria Ext: No pitting leg edema bilaterally. 2+DP/PT pulse bilaterally. Musculoskeletal: No joint deformities, No joint redness or warmth,  no limitation of ROM in spin. Skin: No rashes.  Neuro: Alert, oriented X3, cranial nerves II-XII grossly intact, moves all extremities normally. Muscle strength 5/5 in all extremities, sensation to light touch intact. Brachial reflex 2+ bilaterally. Knee reflex 1+ bilaterally. Negative Babinski's sign. Normal finger to nose test. Psych: Patient is not psychotic, no suicidal or hemocidal ideation.  Labs on Admission: I have personally reviewed following labs and imaging studies  CBC: Recent Labs  Lab 12/03/17 1641 12/03/17 1716  WBC 9.6  --   NEUTROABS 6.9  --   HGB 13.2 13.9  HCT 42.5 41.0  MCV 88.7  --   PLT 342  --    Basic Metabolic Panel: Recent Labs  Lab 12/03/17 1641 12/03/17 1716  NA 142 141  K 3.6 3.8  CL 105 106  CO2 25  --   GLUCOSE 117* 120*  BUN 17 21  CREATININE 0.86 0.80  CALCIUM 10.2  --    GFR: Estimated Creatinine Clearance: 41.9 mL/min (by C-G formula based on SCr of 0.8 mg/dL). Liver Function Tests: Recent Labs  Lab 12/03/17 1641  AST 23  ALT 21  ALKPHOS 59  BILITOT 0.6  PROT 8.4*  ALBUMIN 4.6   No results for input(s): LIPASE, AMYLASE in the last 168 hours. No results for  input(s): AMMONIA in the last 168 hours. Coagulation Profile: Recent Labs  Lab 12/03/17 1641  INR 1.07   Cardiac Enzymes: No results for input(s): CKTOTAL, CKMB, CKMBINDEX, TROPONINI in the last 168 hours. BNP (last 3 results) No results for input(s): PROBNP in the last 8760 hours. HbA1C: No results for input(s): HGBA1C in the last 72 hours. CBG: Recent Labs  Lab 12/03/17 1726  GLUCAP 105*   Lipid Profile: No results for input(s): CHOL, HDL, LDLCALC, TRIG, CHOLHDL, LDLDIRECT in the last 72 hours. Thyroid Function Tests: No results for input(s): TSH, T4TOTAL, FREET4, T3FREE, THYROIDAB in the last 72 hours. Anemia Panel: No results for input(s): VITAMINB12, FOLATE, FERRITIN, TIBC, IRON, RETICCTPCT in the last 72 hours. Urine analysis:    Component Value Date/Time   COLORURINE YELLOW 02/18/2014 1421   APPEARANCEUR CLEAR 02/18/2014 1421   LABSPEC 1.011 02/18/2014 1421   PHURINE 7.0 02/18/2014 1421   GLUCOSEU NEGATIVE 02/18/2014 1421   HGBUR NEGATIVE 02/18/2014 1421   BILIRUBINUR NEGATIVE 02/18/2014 1421   KETONESUR NEGATIVE 02/18/2014 1421   PROTEINUR NEGATIVE 02/18/2014 1421   UROBILINOGEN 0.2 02/18/2014 1421   NITRITE NEGATIVE 02/18/2014 1421   LEUKOCYTESUR SMALL (A) 02/18/2014 1421   Sepsis Labs: @LABRCNTIP (procalcitonin:4,lacticidven:4) )No results found for this or any previous visit (from the past 240 hour(s)).   Radiological Exams on Admission: Ct Head Wo Contrast  Result Date: 12/03/2017 CLINICAL DATA:  84 year old female with jittery feeling and tingling left arm with tightness in right leg. Initial encounter. EXAM: CT HEAD WITHOUT CONTRAST TECHNIQUE: Contiguous axial images were obtained from the base of the skull through the vertex without intravenous contrast. COMPARISON:  01/06/2014 head CT. FINDINGS: Brain: No intracranial hemorrhage or CT evidence of large acute infarct. Prominent chronic microvascular changes. Moderate to marked global atrophy. No  intracranial mass lesion noted on this unenhanced exam. Vascular: Vascular calcifications. Skull: Negative. Sinuses/Orbits: Post lens replacement. No acute orbital abnormality. Minimal mucosal thickening ethmoid sinus air cells. Remainder of visualized paranasal sinuses are clear. Other: Mastoid air cells and middle ear cavities are clear. IMPRESSION: 1. No acute intracranial abnormality noted. 2. Prominent chronic microvascular changes. This limits detection of small acute infarct. 3. Global atrophy. Electronically  Signed   By: Genia Del M.D.   On: 12/03/2017 18:08     EKG: Independently reviewed.  Sinus rhythm, QTC 453, no ischemic change.   Assessment/Plan Principal Problem:   Numbness and tingling in left hand Active Problems:   TIA (transient ischemic attack)   Essential hypertension   Hypothyroidism   HLD (hyperlipidemia)   Iron deficiency anemia   Chest heaviness   Numbness and tingling in left hand and abnormal feeling: Etiology is not clear.  CT-head negative for acute intracranial abnormalities.  Pt seems to have mild difficulty speaking out on my interview. Will need to r/o TIA or stroke.   - Will place on tele bed for obs - will get MRI-brain. If positive for stroke-->will consult neuro - Continue ASA  - fasting lipid panel and HbA1c  - start lipitor  - swallowing screen - PT/OT consult - check orthostatic vital signs  Addendum: MRI negative for new stroke -will check TSH, vitamin b12 and Folate   Chest heaviness: -On aspirin, Lipitor - prn NTG - roponin x3 -Follow-up FLP and A1c  Hx of TIA (transient ischemic attack) - on ASA - started lipitor  HLD: - started lipitor 40 mg daily  HTN:  -Continue home medications: Amlodipine, constant -IV hydralazine prn  Hypothyroidism: Last TSH was 0.624 on 11/09/2016 -Continue home Synthroid  Iron deficiency anemia: Hemoglobin stable, 13.9. -Continue iron supplement    DVT ppx: SQ Lovenox Code Status: Full  code Family Communication: None at bed side.    Disposition Plan:  Anticipate discharge back to previous home environment Consults called:  none Admission status: Obs / tele     Date of Service 12/03/2017    Ivor Costa [facility] Pager [phone]  If 7PM-7AM, please contact night-coverage [email or web address] Password [email or web address] 12/03/2017, 9:08 PM

## 2017-12-03 NOTE — ED Notes (Signed)
Pt has swelling to both feet, reports that is new since she has been here at the ED, also reports noticing her ring being tight on her finger.

## 2017-12-03 NOTE — ED Notes (Signed)
ED Provider at bedside. 

## 2017-12-04 ENCOUNTER — Ambulatory Visit (HOSPITAL_BASED_OUTPATIENT_CLINIC_OR_DEPARTMENT_OTHER): Payer: Federal, State, Local not specified - PPO

## 2017-12-04 ENCOUNTER — Observation Stay (HOSPITAL_BASED_OUTPATIENT_CLINIC_OR_DEPARTMENT_OTHER): Payer: Federal, State, Local not specified - PPO

## 2017-12-04 DIAGNOSIS — E785 Hyperlipidemia, unspecified: Secondary | ICD-10-CM | POA: Diagnosis not present

## 2017-12-04 DIAGNOSIS — I5030 Unspecified diastolic (congestive) heart failure: Secondary | ICD-10-CM | POA: Diagnosis not present

## 2017-12-04 DIAGNOSIS — I10 Essential (primary) hypertension: Secondary | ICD-10-CM | POA: Diagnosis not present

## 2017-12-04 DIAGNOSIS — G459 Transient cerebral ischemic attack, unspecified: Secondary | ICD-10-CM | POA: Diagnosis not present

## 2017-12-04 DIAGNOSIS — R202 Paresthesia of skin: Secondary | ICD-10-CM | POA: Diagnosis not present

## 2017-12-04 DIAGNOSIS — R200 Anesthesia of skin: Secondary | ICD-10-CM | POA: Diagnosis not present

## 2017-12-04 LAB — TSH: TSH: 1.365 u[IU]/mL (ref 0.350–4.500)

## 2017-12-04 LAB — LIPID PANEL
CHOL/HDL RATIO: 5.3 ratio
CHOLESTEROL: 260 mg/dL — AB (ref 0–200)
HDL: 49 mg/dL (ref >40)
LDL Cholesterol: 140 mg/dL — ABNORMAL HIGH (ref 0–99)
TRIGLYCERIDES: 353 mg/dL — AB (ref <150)
VLDL: 71 mg/dL — ABNORMAL HIGH (ref 0–40)

## 2017-12-04 LAB — VITAMIN B12: VITAMIN B 12: 526 pg/mL (ref 180–914)

## 2017-12-04 LAB — ECHOCARDIOGRAM COMPLETE
HEIGHTINCHES: 63.5 in
WEIGHTICAEL: 2056 [oz_av]

## 2017-12-04 LAB — CREATININE, SERUM
CREATININE: 0.86 mg/dL (ref 0.44–1.00)
GFR calc Af Amer: >60 mL/min (ref >60)
GFR calc non Af Amer: 59 mL/min — ABNORMAL LOW (ref >60)

## 2017-12-04 LAB — FOLATE: FOLATE: 39 ng/mL (ref >5.9)

## 2017-12-04 LAB — TROPONIN I: Troponin I: <0.03 ng/mL (ref <0.03)

## 2017-12-04 MED ORDER — TRIAMCINOLONE ACETONIDE 0.1 % EX CREA: 1.0000 "application " | TOPICAL_CREAM | Freq: Two times a day (BID) | CUTANEOUS | Status: DC | PRN

## 2017-12-04 MED ORDER — CALCIUM CARBONATE-VITAMIN D 500-200 MG-UNIT PO TABS
1.0000 | ORAL_TABLET | Freq: Every day | ORAL | Status: DC
  Administered 2017-12-04: 1 via ORAL
  Filled 2017-12-04: qty 1

## 2017-12-04 MED ORDER — ATORVASTATIN CALCIUM 20 MG PO TABS: 20.0000 mg | ORAL_TABLET | Freq: Every day | ORAL | 0 refills | Status: DC

## 2017-12-04 MED ORDER — GADOBUTROL 1 MMOL/ML IV SOLN
7.5000 mL | Freq: Once | INTRAVENOUS | Status: AC | PRN
  Administered 2017-12-04: 5.5 mL via INTRAVENOUS

## 2017-12-04 NOTE — Plan of Care (Signed)
Adequate for discharge.

## 2017-12-04 NOTE — Progress Notes (Signed)
  Echocardiogram 2D Echocardiogram has been performed.  Carsynn Bethune L Androw 12/04/2017, 2:23 PM

## 2017-12-04 NOTE — Evaluation (Addendum)
Physical Therapy Evaluation & Discharge Patient Details Name: Jenna Ramos MRN: [id number] DOB: 08-May-1928 Today's Date: 12/04/2017   History of Present Illness  Pt is an 84 y.o. female admitted 12/03/17 with c/o L hand tingling; etiology unclear. 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 PMH includes HTN, HLD, pre-DM, TIA, fibromyalgia, HOH, carotid artery occlusion.    Clinical Impression  Patient evaluated by Physical Therapy with no further acute PT needs identified. PTA, pt indep and lives alone; remains active, family assist PRN. Today, pt indep with mobility; VSS (see b elow).Reviewed stroke handbook, including stroke symptoms. All education has been completed and the patient has no further questions. Encouraged continued ambulation during hospital admission. PT is signing off. Thank you for this referral.  Orthostatic BPs  Supine 164/67  Sitting 169/67  Standing 172/77      Follow Up Recommendations No PT follow up    Equipment Recommendations  None recommended by PT    Recommendations for Other Services       Precautions / Restrictions Precautions Precautions: None Restrictions Weight Bearing Restrictions: No      Mobility  Bed Mobility Overal bed mobility: Independent                Transfers Overall transfer level: Independent Equipment used: None                Ambulation/Gait Ambulation/Gait assistance: Independent Gait Distance (Feet): 250 Feet Assistive device: None Gait Pattern/deviations: Step-through pattern;Decreased stride length   Gait velocity interpretation: 1.31 - 2.62 ft/sec, indicative of limited community Conservation officer, historic buildings Rankin (Stroke Patients Only) Modified Rankin (Stroke Patients Only) Pre-Morbid Rankin Score: No symptoms Modified Rankin: No symptoms     Balance Overall balance assessment: No  apparent balance deficits (not formally assessed)                                           Pertinent Vitals/Pain Pain Assessment: No/denies pain    Home Living Family/patient expects to be discharged to:: Private residence Living Arrangements: Alone Available Help at Discharge: Family;Available PRN/intermittently Type of Home: House Home Access: Stairs to enter Entrance Stairs-Rails: Psychiatric nurse of Steps: 4 Home Layout: One level Home Equipment: Shower seat;Grab bars - tub/shower;Walker - 2 wheels;Cane - single point      Prior Function Level of Independence: Independent         Comments: 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     Hand Dominance   Dominant Hand: Right    Extremity/Trunk Assessment   Upper Extremity Assessment Upper Extremity Assessment: Overall WFL for tasks assessed    Lower Extremity Assessment Lower Extremity Assessment: Overall WFL for tasks assessed       Communication   Communication: HOH  Cognition Arousal/Alertness: Awake/alert Behavior During Therapy: WFL for tasks assessed/performed Overall Cognitive Status: No family/caregiver present to determine baseline cognitive functioning Area of Impairment: Attention                   Current Attention Level: Alternating           General Comments: Likely baseline cognition      General Comments General comments (skin integrity, edema, etc.): Supine BP 164/67, sitting BP 169/79, standing BP  172/88    Exercises     Assessment/Plan    PT Assessment Patent does not need any further PT services  PT Problem List         PT Treatment Interventions      PT Goals (Current goals can be found in the Care Plan section)  Acute Rehab PT Goals PT Goal Formulation: All assessment and education complete, DC therapy    Frequency     Barriers to discharge        Co-evaluation               AM-PAC PT "6 Clicks" Daily  Activity  Outcome Measure Difficulty turning over in bed (including adjusting bedclothes, sheets and blankets)?: None Difficulty moving from lying on back to sitting on the side of the bed? : None Difficulty sitting down on and standing up from a chair with arms (e.g., wheelchair, bedside commode, etc,.)?: None Help needed moving to and from a bed to chair (including a wheelchair)?: None Help needed walking in hospital room?: None Help needed climbing 3-5 steps with a railing? : A Little 6 Click Score: 23    End of Session Equipment Utilized During Treatment: Gait belt Activity Tolerance: Patient tolerated treatment well Patient left: in chair;with call bell/phone within reach Nurse Communication: Mobility status PT Visit Diagnosis: Other abnormalities of gait and mobility (R26.89)    Time: 9628-3662 PT Time Calculation (min) (ACUTE ONLY): 24 min   Charges:   PT Evaluation $PT Eval Moderate Complexity: 1 Mod PT Treatments $Gait Training: 8-22 mins       Mabeline Caras, PT, DPT Acute Rehabilitation Services  Pager [phone] Office [phone]  Derry Lory 12/04/2017, 9:03 AM

## 2017-12-04 NOTE — Progress Notes (Signed)
OT Cancellation Note  Patient Details Name: Jenna Ramos MRN: [id number] DOB: 06-26-1928   Cancelled Treatment:    Reason Eval/Treat Not Completed: OT screened, no needs identified, will sign off.  Pt reports she is back to baseline.   MRI negative for acute intracranial abnormality.  Lucille Passy, OTR/L [place] Pager [phone] Office [phone]   Lucille Passy M 12/04/2017, 10:27 AM

## 2017-12-04 NOTE — Progress Notes (Signed)
Preliminary notes--Bilateral carotid duplex exam completed. Bilateral ICAs 1-39% stenosis according to the category of velocity. Bilateral vertebral arteries patent with antegrade flow. Scattered calcifications noticed bilateral CCA, ECA and Prox ICAs.  Hongying Wildon Cuevas (RDMS RVT) 12/04/17 2:04 PM

## 2017-12-04 NOTE — Discharge Summary (Signed)
Physician Discharge Summary  JASKIRAN PATA VOH:[id number] DOB: Apr 09, 1928 DOA: 12/03/2017  PCP: [name], DO  Admit date: 12/03/2017 Discharge date: 12/04/2017  Admitted From: home Discharge disposition: home   Recommendations for Outpatient Follow-Up:   1. Referral to neurology outpatient for suspected TIA 2. Better control of cholesterol    Discharge Diagnosis:   Principal Problem:   Numbness and tingling in left hand Active Problems:   TIA (transient ischemic attack)   Essential hypertension   Hypothyroidism   HLD (hyperlipidemia)   Iron deficiency anemia   Chest heaviness    Discharge Condition: Improved.  Diet recommendation: Low sodium, heart healthy  Wound care: None.  Code status: Full.   History of Present Illness:   HANNE KEGG is a 84 y.o. female with medical history significant of hypertension, hyperlipidemia, prediabetes, TIA, hypothyroidism, iron deficiency anemia, carotid artery occlusion (s/p of bilateral endarterectomy), fibromyalgia, HOH, who presents with left hand tingling, abnormal feeling  Patient states that she had a one episode of whole-body heavy feeling yesterday, which lasted for several hours, then resolved spontaneously.  She states that the heaviness include chest heaviness, but no CP. Today at about a 2 PM, she started feeling abnormal, including feeling "jittery", tighghness to R leg, and "foggy" mentally and left hand tingling/tingling, which lasted for several hours, currently resolved. Patient denies vision change or hearing loss.  No facial droop.  She seems to have mild difficulty speaking out on my interview.  Denies chest pain, shortness breath, cough, fever or chills.  No nausea, vomiting, diarrhea, abdominal pain.  Denies symptoms of UTI.   Hospital Course by Problem:   Numbness and tingling in left hand and abnormal feeling:  Appears to be TIA-- similar to prior TIA -MRI  negative -LDL>70--- start lipitor  -ASA -did well with PT/OT -carotids <40% b/l Echo: - Left ventricle: The cavity size was normal. Wall thickness was   normal. Systolic function was vigorous. The estimated ejection   fraction was in the range of 65% to 70%. Wall motion was normal;   there were no regional wall motion abnormalities. Features are   consistent with a pseudonormal left ventricular filling pattern,   with concomitant abnormal relaxation and increased filling   pressure (grade 2 diastolic dysfunction). - Aortic valve: Valve area (VTI): 1.68 cm^2. Valve area (Vmax):   1.52 cm^2. Valve area (Vmean): 1.51 cm^2. -no a fib seen on tele   HLD: - started lipitor 40 mg daily  HTN:  -Continue home medications: Amlodipine, constant -IV hydralazine prn  Hypothyroidism: Last TSH was 0.624 on 11/09/2016 -Continue home Synthroid  Iron deficiency anemia: Hemoglobin stable, 13.9. -Continue iron supplement      Medical Consultants:      Discharge Exam:   Vitals:   12/04/17 0556 12/04/17 1021  BP: (!) 145/57 (!) 149/60  Pulse: 69 68  Resp: 16 12  Temp: 98 F (36.7 C) 99.2 F (37.3 C)  SpO2: 100% 98%   Vitals:   12/03/17 2045 12/03/17 2207 12/04/17 0556 12/04/17 1021  BP: (!) 154/60 (!) 156/66 (!) 145/57 (!) 149/60  Pulse: 69 75 69 68  Resp: 10 14 16 12   Temp:  98.7 F (37.1 C) 98 F (36.7 C) 99.2 F (37.3 C)  TempSrc:  Oral Oral Oral  SpO2: 100% 98% 100% 98%  Weight:      Height:        General exam: Appears calm and comfortable.   The results of  significant diagnostics from this hospitalization (including imaging, microbiology, ancillary and laboratory) are listed below for reference.     Procedures and Diagnostic Studies:   Ct Head Wo Contrast  Result Date: 12/03/2017 CLINICAL DATA:  84 year old female with jittery feeling and tingling left arm with tightness in right leg. Initial encounter. EXAM: CT HEAD WITHOUT CONTRAST TECHNIQUE:  Contiguous axial images were obtained from the base of the skull through the vertex without intravenous contrast. COMPARISON:  01/06/2014 head CT. FINDINGS: Brain: No intracranial hemorrhage or CT evidence of large acute infarct. Prominent chronic microvascular changes. Moderate to marked global atrophy. No intracranial mass lesion noted on this unenhanced exam. Vascular: Vascular calcifications. Skull: Negative. Sinuses/Orbits: Post lens replacement. No acute orbital abnormality. Minimal mucosal thickening ethmoid sinus air cells. Remainder of visualized paranasal sinuses are clear. Other: Mastoid air cells and middle ear cavities are clear. IMPRESSION: 1. No acute intracranial abnormality noted. 2. Prominent chronic microvascular changes. This limits detection of small acute infarct. 3. Global atrophy. Electronically Signed   By: Genia Del M.D.   On: 12/03/2017 18:08   Mr Jeri Cos YO Contrast  Result Date: 12/04/2017 CLINICAL DATA:  Body heaviness yesterday, RIGHT leg tightness today. Mental fogging with LEFT hand tingling for several hours. Symptoms now resolved. History of stroke, carotid artery occlusion hypertension and hyperlipidemia. EXAM: MRI HEAD WITHOUT AND WITH CONTRAST TECHNIQUE: Multiplanar, multiecho pulse sequences of the brain and surrounding structures were obtained without and with intravenous contrast. CONTRAST:  5.5 cc Gadavist COMPARISON:  CT HEAD December 03, 2017 MRI head January 05, 2014 FINDINGS: INTRACRANIAL CONTENTS: No reduced diffusion to suggest acute ischemia. Scattered chronic microhemorrhages associated with chronic hypertension. Mild advanced parietal atrophy. No hydrocephalus. Confluent supratentorial white matter FLAIR T2 hyperintensities. No suspicious parenchymal signal, masses, mass effect. Prominent basal ganglia perivascular spaces associated with chronic small vessel ischemic changes. Old LEFT thalamus to internal capsule infarct. No abnormal intraparenchymal or  extra-axial enhancement. No abnormal extra-axial fluid collections. No extra-axial masses. VASCULAR: Normal major intracranial vascular flow voids present at skull base. SKULL AND UPPER CERVICAL SPINE: No abnormal sellar expansion. No suspicious calvarial bone marrow signal. Craniocervical junction maintained. SINUSES/ORBITS: Small bilateral mastoid effusions. Paranasal sinuses are well aerated.The included ocular globes and orbital contents are non-suspicious. Status post bilateral ocular lens implants. OTHER: None. IMPRESSION: 1. No acute intracranial process. 2. Severe chronic small vessel ischemic changes. 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 3. Disproportionately advanced parietal atrophy associated with neuro degenerative syndromes. Electronically Signed   By: Elon Alas M.D.   On: 12/04/2017 00:57     Labs:   Basic Metabolic Panel: Recent Labs  Lab 12/03/17 1641 12/03/17 1716 12/03/17 2318  NA 142 141  --   K 3.6 3.8  --   CL 105 106  --   CO2 25  --   --   GLUCOSE 117* 120*  --   BUN 17 21  --   CREATININE 0.86 0.80 0.86  CALCIUM 10.2  --   --    GFR Estimated Creatinine Clearance: 39 mL/min (by C-G formula based on SCr of 0.86 mg/dL). Liver Function Tests: Recent Labs  Lab 12/03/17 1641  AST 23  ALT 21  ALKPHOS 59  BILITOT 0.6  PROT 8.4*  ALBUMIN 4.6   No results for input(s): LIPASE, AMYLASE in the last 168 hours. No results for input(s): AMMONIA in the last 168 hours. Coagulation profile Recent Labs  Lab 12/03/17 1641  INR 1.07    CBC: Recent Labs  Lab 12/03/17 1641 12/03/17 1716  WBC 9.6  --   NEUTROABS 6.9  --   HGB 13.2 13.9  HCT 42.5 41.0  MCV 88.7  --   PLT 342  --    Cardiac Enzymes: Recent Labs  Lab 12/03/17 2318  TROPONINI <0.03   BNP: Invalid input(s): POCBNP CBG: Recent Labs  Lab 12/03/17 1726  GLUCAP 105*   D-Dimer No results for input(s): DDIMER in the last 72 hours. Hgb A1c No results for input(s): HGBA1C in  the last 72 hours. Lipid Profile Recent Labs    12/04/17 0716  CHOL 260*  HDL 49  LDLCALC 140*  TRIG 353*  CHOLHDL 5.3   Thyroid function studies Recent Labs    12/04/17 0716  TSH 1.365   Anemia work up Recent Labs    12/04/17 0716  VITAMINB12 526  FOLATE 39.0   Microbiology No results found for this or any previous visit (from the past 240 hour(s)).   Discharge Instructions:   Discharge Instructions    Ambulatory referral to Neurology   Complete by:  As directed    An appointment is requested in approximately: 2-3 weeks     Allergies as of 12/04/2017      Reactions   Aggrenox [aspirin-dipyridamole Er] Anaphylaxis   Severe headache   Codeine Other (See Comments)   Feet burn and agitation   Morphine And Related Other (See Comments)   Feet burn and agitation      Medication List    TAKE these medications   amLODipine 10 MG tablet Commonly known as:  NORVASC Take 1 tablet (10 mg total) by mouth daily.   aspirin 325 MG EC tablet Take 325 mg by mouth daily.   atorvastatin 20 MG tablet Commonly known as:  LIPITOR Take 1 tablet (20 mg total) by mouth daily at 6 PM.   benazepril 20 MG tablet Commonly known as:  LOTENSIN TAKE 1 TABLET BY MOUTH EVERY DAY   BESIVANCE 0.6 % Susp Generic drug:  Besifloxacin HCl Place 1 drop into both eyes See admin instructions. Instill one drop into both eyes 4 times daily on the day of and the day after eye injections - once a month   bimatoprost 0.01 % Soln Commonly known as:  LUMIGAN Place 1 drop into both eyes at bedtime.   CALCIUM 600/VITAMIN D 600-400 MG-UNIT tablet Generic drug:  Calcium Carbonate-Vitamin D Take 1 tablet by mouth daily with lunch.   Fish Oil 1200 MG Caps Take 1,200 mg by mouth daily with supper.   iron polysaccharides 150 MG capsule Commonly known as:  NIFEREX Take 1 capsule (150 mg total) by mouth daily. What changed:  when to take this   levothyroxine 75 MCG tablet Commonly known as:   SYNTHROID, LEVOTHROID TAKE 1 TABLET (75 MCG TOTAL) BY MOUTH DAILY BEFORE BREAKFAST. What changed:  when to take this   multivitamin with minerals Tabs tablet Take 1 tablet by mouth daily.   triamcinolone cream 0.1 % Commonly known as:  KENALOG Apply 1 application topically 2 (two) times daily as needed (scalp itching).   TYLENOL PO Take 1-2 tablets by mouth every 6 (six) hours as needed (pain/headache).      Follow-up Information    Ronnie Doss M, DO Follow up in 1 week(s).   Specialty:  Family Medicine Contact information: [place] [address] [phone]            Time coordinating discharge: 25 min  Signed:  Geradine Girt  [facility] 12/04/2017, 3:57 PM

## 2017-12-05 LAB — HEMOGLOBIN A1C
HEMOGLOBIN A1C: 6.1 % — AB (ref 4.8–5.6)
Mean Plasma Glucose: 128 mg/dL

## 2017-12-10 ENCOUNTER — Ambulatory Visit: Payer: Federal, State, Local not specified - PPO | Admitting: Family Medicine

## 2017-12-10 ENCOUNTER — Encounter: Payer: Self-pay | Admitting: Family Medicine

## 2017-12-10 VITALS — BP 142/58 | HR 71 | Temp 98.4°F | Ht 63.0 in | Wt 131.0 lb

## 2017-12-10 DIAGNOSIS — I10 Essential (primary) hypertension: Secondary | ICD-10-CM

## 2017-12-10 DIAGNOSIS — Z23 Encounter for immunization: Secondary | ICD-10-CM

## 2017-12-10 DIAGNOSIS — Z09 Encounter for follow-up examination after completed treatment for conditions other than malignant neoplasm: Secondary | ICD-10-CM

## 2017-12-10 DIAGNOSIS — E782 Mixed hyperlipidemia: Secondary | ICD-10-CM

## 2017-12-10 DIAGNOSIS — G459 Transient cerebral ischemic attack, unspecified: Secondary | ICD-10-CM | POA: Diagnosis not present

## 2017-12-10 MED ORDER — ATORVASTATIN CALCIUM 20 MG PO TABS: 20.0000 mg | ORAL_TABLET | Freq: Every day | ORAL | 0 refills | Status: DC

## 2017-12-10 NOTE — Progress Notes (Signed)
Subjective: CC: BP check/ Hospital follow up PCP: [name], DO FXT:[id number] N Jenna Ramos is a 84 y.o. female presenting to clinic today for:  1. Hospital follow up TIA/ HTN/ HLD Patient was admitted to the hospital 12/03/2017 for slurred speech and numbness and tingling of left hand.  It was determined that her symptoms are related to a TIA.  Her MRI was negative.  She had carotid ultrasounds which demonstrated less than 40% stenosis bilaterally.  Echocardiogram demonstrated normal systolic function with grade 2 diastolic dysfunction.  Her LDL was noted to be above 70 and therefore Lipitor 40 mg was started. She reports good tolerance of the Lipitor.  Denies any myalgia.  She still does not quite feel her normal self but overall feels much better than when she was seen in the emergency department.  No falls.  No visual disturbance.  She is followed by ophthalmology for regular eye injections.  Speech has been normal.  Blood pressure readings have been good.  She brings a blood pressure log which shows blood pressures typically around 120/60-70s.  No chest pain, shortness of breath.   ROS: Per HPI  Allergies  Allergen Reactions  . Aggrenox [Aspirin-Dipyridamole Er] Anaphylaxis    Severe headache  . Codeine Other (See Comments)    Feet burn and agitation  . Morphine And Related Other (See Comments)    Feet burn and agitation   Past Medical History:  Diagnosis Date  . Anemia    takes Iron pill daily  . Arthritis   . Carotid artery occlusion   . Fibromyalgia   . Fibromyalgia   . Glaucoma    uses Eye Drops daily  . HOH (hard of hearing)   . Hyperlipidemia    takes Atorvastatin daily  . Hypertension    takes Benazepril daily  . Hypothyroidism    takes Synthroid daily  . Macular degeneration    wet and gets injections in both eyes  . Nocturia   . Peripheral vascular disease ([place])   . Rectal incontinence   . Stroke [facility])     Current Outpatient Medications:  .   Acetaminophen (TYLENOL PO), Take 1-2 tablets by mouth every 6 (six) hours as needed (pain/headache)., Disp: , Rfl:  .  amLODipine (NORVASC) 10 MG tablet, Take 1 tablet (10 mg total) by mouth daily., Disp: 90 tablet, Rfl: 1 .  aspirin 325 MG EC tablet, Take 325 mg by mouth daily. , Disp: , Rfl:  .  atorvastatin (LIPITOR) 20 MG tablet, Take 1 tablet (20 mg total) by mouth daily at 6 PM., Disp: 30 tablet, Rfl: 0 .  benazepril (LOTENSIN) 20 MG tablet, TAKE 1 TABLET BY MOUTH EVERY DAY (Patient taking differently: Take 20 mg by mouth daily. ), Disp: 90 tablet, Rfl: 1 .  Besifloxacin HCl (BESIVANCE) 0.6 % SUSP, Place 1 drop into both eyes See admin instructions. Instill one drop into both eyes 4 times daily on the day of and the day after eye injections - once a month, Disp: , Rfl:  .  bimatoprost (LUMIGAN) 0.01 % SOLN, Place 1 drop into both eyes at bedtime., Disp: , Rfl:  .  Calcium Carbonate-Vitamin D (CALCIUM 600/VITAMIN D) 600-400 MG-UNIT per tablet, Take 1 tablet by mouth daily with lunch. , Disp: , Rfl:  .  iron polysaccharides (FERREX 150) 150 MG capsule, Take 1 capsule (150 mg total) by mouth daily. (Patient taking differently: Take 150 mg by mouth daily before lunch. ), Disp: 60 capsule, Rfl: 3 .  levothyroxine (SYNTHROID, LEVOTHROID) 75 MCG tablet, TAKE 1 TABLET (75 MCG TOTAL) BY MOUTH DAILY BEFORE BREAKFAST. (Patient taking differently: Take 75 mcg by mouth at bedtime. ), Disp: 90 tablet, Rfl: 2 .  Multiple Vitamin (MULTIVITAMIN WITH MINERALS) TABS tablet, Take 1 tablet by mouth daily., Disp: , Rfl:  .  Omega-3 Fatty Acids (FISH OIL) 1200 MG CAPS, Take 1,200 mg by mouth daily with supper. , Disp: , Rfl:  .  triamcinolone cream (KENALOG) 0.1 %, Apply 1 application topically 2 (two) times daily as needed (scalp itching)., Disp: , Rfl:  Social History   Socioeconomic History  . Marital status: Widowed    Spouse name: Not on file  . Number of children: Not on file  . Years of education: Not on  file  . Highest education level: Not on file  Occupational History  . Not on file  Social Needs  . Financial resource strain: Not on file  . Food insecurity:    Worry: Not on file    Inability: Not on file  . Transportation needs:    Medical: Not on file    Non-medical: Not on file  Tobacco Use  . Smoking status: Former Research scientist (life sciences)  . Smokeless tobacco: Never Used  . Tobacco comment: quit smoking 58yrs ago  Substance and Sexual Activity  . Alcohol use: No    Alcohol/week: 0.0 standard drinks  . Drug use: No  . Sexual activity: Yes    Birth control/protection: Post-menopausal  Lifestyle  . Physical activity:    Days per week: Not on file    Minutes per session: Not on file  . Stress: Not on file  Relationships  . Social connections:    Talks on phone: Not on file    Gets together: Not on file    Attends religious service: Not on file    Active member of club or organization: Not on file    Attends meetings of clubs or organizations: Not on file    Relationship status: Not on file  . Intimate partner violence:    Fear of current or ex partner: Not on file    Emotionally abused: Not on file    Physically abused: Not on file    Forced sexual activity: Not on file  Other Topics Concern  . Not on file  Social History Narrative  . Not on file   Family History  Problem Relation Age of Onset  . Prostate cancer Father   . Cancer Father   . Lung disease Sister   . Cancer Brother     Objective: Office vital signs reviewed. BP (!) 148/60   Pulse 71   Temp 98.4 F (36.9 C) (Oral)   Ht 5\' 3"  (1.6 m)   Wt 131 lb (59.4 kg)   BMI 23.21 kg/m   Physical Examination:  General: Awake, alert, well nourished, No acute distress HEENT: Normal    Eyes: PERRLA, extraocular membranes intact, sclera white Cardio: regular rate and rhythm, S1S2 heard, no murmurs Pulm: clear to auscultation bilaterally, no wheezes, rhonchi or rales; normal work of breathing on room air Neuro: Alert  and oriented x3.  Normal upper extremity and lower extremity cerebellar testing.  Negative Romberg.  Assessment/ Plan: 84 y.o. female   1. Hospital discharge follow-up Doing well since discharge.  I do not see where she has a neurology appointment scheduled therefore referral has been placed.  We discussed needing to maximize blood pressure control and cholesterol control to avoid future brain injury  and cardiovascular complications. - Ambulatory referral to Neurology  2. TIA (transient ischemic attack) - Ambulatory referral to Neurology  3. Essential hypertension Systolic blood pressure is slightly borderline for age.  Ever, Home blood pressure readings are appropriate.  Continue current regimen.  Follow-up in 3 months.  4. Mixed hyperlipidemia Plan to recheck lipid panel in 3 months.  Continue Lipitor 40.   Orders Placed This Encounter  Procedures  . Ambulatory referral to Neurology    Referral Priority:   Routine    Referral Type:   Consultation    Referral Reason:   Specialty Services Required    Requested Specialty:   Neurology    Number of Visits Requested:   1   Meds ordered this encounter  Medications  . atorvastatin (LIPITOR) 20 MG tablet    Sig: Take 1 tablet (20 mg total) by mouth daily at 6 PM.    Dispense:  90 tablet    Refill:  0     Blessed Girdner Windell Moulding, DO [place] [phone]

## 2017-12-10 NOTE — Patient Instructions (Signed)
Follow up in 3 months for blood pressure and cholesterol.   Transient Ischemic Attack A transient ischemic attack (TIA) is a "warning stroke" that causes stroke-like symptoms. A TIA does not cause lasting damage to the brain. The symptoms of a TIA can happen fast and do not last long. It is important to know the symptoms of a TIA and what to do. This can help prevent stroke or death. Follow these instructions at home:  Take medicines only as told by your doctor. Make sure you understand all of the instructions.  You may need to take aspirin or warfarin medicine. Warfarin needs to be taken exactly as told. ? Taking too much or too little warfarin is dangerous. Blood tests must be done as often as told by your doctor. A PT blood test measures how long it takes for blood to clot. Your PT is used to calculate another value called an INR. Your PT and INR help your doctor adjust your warfarin dosage. He or she will make sure you are taking the right amount. ? Food can cause problems with warfarin and affect the results of your blood tests. This is true for foods high in vitamin K. Eat the same amount of foods high in vitamin K each day. Foods high in vitamin K include spinach, kale, broccoli, cabbage, collard and turnip greens, Brussels sprouts, peas, cauliflower, seaweed, and parsley. Other foods high in vitamin K include beef and pork liver, green tea, and soybean oil. Eat the same amount of foods high in vitamin K each day. Avoid big changes in your diet. Tell your doctor before changing your diet. Talk to a food specialist (dietitian) if you have questions. ? Many medicines can cause problems with warfarin and affect your PT and INR. Tell your doctor about all medicines you take. This includes vitamins and dietary pills (supplements). Do not take or stop taking any prescribed or over-the-counter medicines unless your doctor tells you to. ? Warfarin can cause more bruising or bleeding. Hold pressure over  any cuts for longer than normal. Talk to your doctor about other side effects of warfarin. ? Avoid sports or activities that may cause injury or bleeding. ? Be careful when you shave, floss, or use sharp objects. ? Avoid or drink very little alcohol while taking warfarin. Tell your doctor if you change how much alcohol you drink. ? Tell your dentist and other doctors that you take warfarin before any procedures.  Follow your diet program as told, if you are given one.  Keep a healthy weight.  Stay active. Try to get at least 30 minutes of activity on all or most days.  Do not use any tobacco products, including cigarettes, chewing tobacco, or electronic cigarettes. If you need help quitting, ask your doctor.  Limit alcohol intake to no more than 1 drink per day for nonpregnant women and 2 drinks per day for men. One drink equals 12 ounces of beer, 5 ounces of wine, or 1 ounces of hard liquor.  Do not abuse drugs.  Keep your home safe so you do not fall. You can do this by: ? Putting grab bars in the bedroom and bathroom. ? Raising toilet seats. ? Putting a seat in the shower.  Keep all follow-up visits as told by your doctor. This is important. Contact a doctor if:  Your personality changes.  You have trouble swallowing.  You have double vision.  You are dizzy.  You have a fever. Get help right away if:  These symptoms may be an emergency. Do not wait to see if the symptoms will go away. Get medical help right away. Call your local emergency services (911 in the [place] Do not drive yourself to the hospital.  You have sudden weakness or lose feeling (go numb), especially on one side of the body. This can affect your: ? Face. ? Arm. ? Leg.  You have sudden trouble walking.  You have sudden trouble moving your arms or legs.  You have sudden confusion.  You have trouble talking.  You have trouble understanding.  You have sudden trouble seeing in one or both  eyes.  You lose your balance.  Your movements are not smooth.  You have a sudden, very bad headache with no known cause.  You have new chest pain.  Your heartbeat is unsteady.  You are partly or totally unaware of what is going on around you.  This information is not intended to replace advice given to you by your health care provider. Make sure you discuss any questions you have with your health care provider. Document Released: 11/30/2007 Document Revised: 10/25/2015 Document Reviewed: 05/28/2013 Elsevier Interactive Patient Education  [employer].

## 2017-12-11 ENCOUNTER — Encounter (INDEPENDENT_AMBULATORY_CARE_PROVIDER_SITE_OTHER): Payer: Federal, State, Local not specified - PPO | Admitting: Ophthalmology

## 2017-12-11 DIAGNOSIS — H35033 Hypertensive retinopathy, bilateral: Secondary | ICD-10-CM | POA: Diagnosis not present

## 2017-12-11 DIAGNOSIS — H353231 Exudative age-related macular degeneration, bilateral, with active choroidal neovascularization: Secondary | ICD-10-CM

## 2017-12-11 DIAGNOSIS — H43813 Vitreous degeneration, bilateral: Secondary | ICD-10-CM | POA: Diagnosis not present

## 2017-12-11 DIAGNOSIS — I10 Essential (primary) hypertension: Secondary | ICD-10-CM

## 2017-12-17 ENCOUNTER — Encounter (HOSPITAL_COMMUNITY): Payer: Self-pay | Admitting: Emergency Medicine

## 2017-12-17 ENCOUNTER — Emergency Department (HOSPITAL_COMMUNITY)
Admission: EM | Admit: 2017-12-17 | Discharge: 2017-12-18 | Disposition: A | Discharge status: Home / Self Care | Payer: Federal, State, Local not specified - PPO | Attending: Emergency Medicine | Admitting: Emergency Medicine

## 2017-12-17 ENCOUNTER — Other Ambulatory Visit: Payer: Self-pay

## 2017-12-17 ENCOUNTER — Emergency Department (HOSPITAL_COMMUNITY): Payer: Federal, State, Local not specified - PPO

## 2017-12-17 DIAGNOSIS — Z79899 Other long term (current) drug therapy: Secondary | ICD-10-CM | POA: Insufficient documentation

## 2017-12-17 DIAGNOSIS — I10 Essential (primary) hypertension: Secondary | ICD-10-CM | POA: Insufficient documentation

## 2017-12-17 DIAGNOSIS — Z87891 Personal history of nicotine dependence: Secondary | ICD-10-CM | POA: Insufficient documentation

## 2017-12-17 DIAGNOSIS — R42 Dizziness and giddiness: Secondary | ICD-10-CM | POA: Diagnosis present

## 2017-12-17 DIAGNOSIS — Z862 Personal history of diseases of the blood and blood-forming organs and certain disorders involving the immune mechanism: Secondary | ICD-10-CM | POA: Diagnosis not present

## 2017-12-17 DIAGNOSIS — Z8673 Personal history of transient ischemic attack (TIA), and cerebral infarction without residual deficits: Secondary | ICD-10-CM | POA: Insufficient documentation

## 2017-12-17 DIAGNOSIS — E039 Hypothyroidism, unspecified: Secondary | ICD-10-CM | POA: Diagnosis not present

## 2017-12-17 DIAGNOSIS — Z7982 Long term (current) use of aspirin: Secondary | ICD-10-CM | POA: Insufficient documentation

## 2017-12-17 LAB — DIFFERENTIAL
ABS IMMATURE GRANULOCYTES: 0.05 10*3/uL (ref 0.00–0.07)
BASOS ABS: 0 10*3/uL (ref 0.0–0.1)
BASOS PCT: 0 %
EOS ABS: 0.1 10*3/uL (ref 0.0–0.5)
EOS PCT: 1 %
Immature Granulocytes: 1 %
LYMPHS ABS: 2 10*3/uL (ref 0.7–4.0)
Lymphocytes Relative: 22 %
MONO ABS: 0.8 10*3/uL (ref 0.1–1.0)
MONOS PCT: 8 %
NEUTROS PCT: 68 %
Neutro Abs: 6.3 10*3/uL (ref 1.7–7.7)

## 2017-12-17 LAB — I-STAT CHEM 8, ED
BUN: 22 mg/dL (ref 8–23)
CREATININE: 0.8 mg/dL (ref 0.44–1.00)
Calcium, Ion: 1.16 mmol/L (ref 1.15–1.40)
Chloride: 103 mmol/L (ref 98–111)
GLUCOSE: 211 mg/dL — AB (ref 70–99)
HEMATOCRIT: 41 % (ref 36.0–46.0)
HEMOGLOBIN: 13.9 g/dL (ref 12.0–15.0)
Potassium: 3.8 mmol/L (ref 3.5–5.1)
Sodium: 140 mmol/L (ref 135–145)
TCO2: 26 mmol/L (ref 22–32)

## 2017-12-17 LAB — CBC
HCT: 41.4 % (ref 36.0–46.0)
HEMOGLOBIN: 13.3 g/dL (ref 12.0–15.0)
MCH: 27.8 pg (ref 26.0–34.0)
MCHC: 32.1 g/dL (ref 30.0–36.0)
MCV: 86.4 fL (ref 80.0–100.0)
NRBC: 0 % (ref 0.0–0.2)
Platelets: 360 10*3/uL (ref 150–400)
RBC: 4.79 MIL/uL (ref 3.87–5.11)
RDW: 13.7 % (ref 11.5–15.5)
WBC: 9.2 10*3/uL (ref 4.0–10.5)

## 2017-12-17 LAB — COMPREHENSIVE METABOLIC PANEL
ALT: 21 U/L (ref 0–44)
AST: 27 U/L (ref 15–41)
Albumin: 4.4 g/dL (ref 3.5–5.0)
Alkaline Phosphatase: 60 U/L (ref 38–126)
Anion gap: 10 (ref 5–15)
BUN: 19 mg/dL (ref 8–23)
CHLORIDE: 103 mmol/L (ref 98–111)
CO2: 25 mmol/L (ref 22–32)
Calcium: 9.7 mg/dL (ref 8.9–10.3)
Creatinine, Ser: 0.85 mg/dL (ref 0.44–1.00)
GFR, EST NON AFRICAN AMERICAN: 60 mL/min — AB (ref >60)
Glucose, Bld: 215 mg/dL — ABNORMAL HIGH (ref 70–99)
POTASSIUM: 3.7 mmol/L (ref 3.5–5.1)
Sodium: 138 mmol/L (ref 135–145)
TOTAL PROTEIN: 8 g/dL (ref 6.5–8.1)
Total Bilirubin: 0.5 mg/dL (ref 0.3–1.2)

## 2017-12-17 LAB — I-STAT TROPONIN, ED: TROPONIN I, POC: 0 ng/mL (ref 0.00–0.08)

## 2017-12-17 LAB — PROTIME-INR
INR: 1.05
Prothrombin Time: 13.6 seconds (ref 11.4–15.2)

## 2017-12-17 LAB — APTT: APTT: 32 s (ref 24–36)

## 2017-12-17 NOTE — ED Triage Notes (Signed)
Pt reports having dizziness x 2 weeks. Pt reports she was seen 2 weeks ago and told she may have had a TIA. Pt reports she had some numbness to back of her neck around 7:30 that went away. Pt does not currently have any neuro deficits.

## 2017-12-18 LAB — URINALYSIS, ROUTINE W REFLEX MICROSCOPIC
Bilirubin Urine: NEGATIVE
GLUCOSE, UA: NEGATIVE mg/dL
Hgb urine dipstick: NEGATIVE
Ketones, ur: NEGATIVE mg/dL
LEUKOCYTES UA: NEGATIVE
Nitrite: NEGATIVE
Protein, ur: NEGATIVE mg/dL
Specific Gravity, Urine: 1.006 (ref 1.005–1.030)
pH: 8 (ref 5.0–8.0)

## 2017-12-18 NOTE — ED Notes (Signed)
Pt ambulated with no problem pt did feel like she was a little dizzy but was still walking with a steady gait.

## 2017-12-18 NOTE — ED Provider Notes (Signed)
TIME SEEN: 2:24 AM  CHIEF COMPLAINT: Lightheadedness  HPI: Patient is an 84 year old female with history of hypertension, hyperlipidemia, hypothyroidism who presents to the emergency department with 2 to 3 weeks of lightheadedness.  Denies any vertigo.  Symptoms are worse with standing and bending over.  She denies any numbness, tingling or focal weakness.  No vision changes or speech changes.  No chest pain or shortness of breath.  No fever.  Was admitted to the hospital on September 30 for possible TIA when she had similar symptoms.  At that time she also had other intermittent focal neurologic deficits and was thought to have a TIA.  MRI of her brain was normal, echocardiogram showed grade 2 diastolic dysfunction, carotid Doppler showed 40 percent stenosis bilaterally.  Patient will follow-up with neurology as an outpatient but has not seen them yet.  States symptoms started when her doctor increased her Norvasc from 5 mg to 10 mg and she was told to take her Norvasc and benazepril at the same time.  She states she used to take one in the morning and 1 at night.  ROS: See HPI Constitutional: no fever  Eyes: no drainage  ENT: no runny nose   Cardiovascular:  no chest pain  Resp: no SOB  GI: no vomiting GU: no dysuria Integumentary: no rash  Allergy: no hives  Musculoskeletal: no leg swelling  Neurological: no slurred speech ROS otherwise negative  PAST MEDICAL HISTORY/PAST SURGICAL HISTORY:  Past Medical History:  Diagnosis Date  . Anemia    takes Iron pill daily  . Arthritis   . Carotid artery occlusion   . Fibromyalgia   . Fibromyalgia   . Glaucoma    uses Eye Drops daily  . HOH (hard of hearing)   . Hyperlipidemia    takes Atorvastatin daily  . Hypertension    takes Benazepril daily  . Hypothyroidism    takes Synthroid daily  . Macular degeneration    wet and gets injections in both eyes  . Nocturia   . Peripheral vascular disease ([place])   . Rectal incontinence   .  Stroke ([place])   . 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 10/28/2013    MEDICATIONS:  Prior to Admission medications   Medication Sig Start Date End Date Taking? Authorizing Provider  Acetaminophen (TYLENOL PO) Take 1-2 tablets by mouth every 6 (six) hours as needed (pain/headache).    [provider]  amLODipine (NORVASC) 10 MG tablet Take 1 tablet (10 mg total) by mouth daily. 11/09/17   [name], DO  aspirin 325 MG EC tablet Take 325 mg by mouth daily.     [provider]  atorvastatin (LIPITOR) 20 MG tablet Take 1 tablet (20 mg total) by mouth daily at 6 PM. 12/10/17   Gottschalk, Ashly M, DO  benazepril (LOTENSIN) 20 MG tablet TAKE 1 TABLET BY MOUTH EVERY DAY Patient taking differently: Take 20 mg by mouth daily.  08/06/17   Timmothy Euler, MD  Besifloxacin HCl (BESIVANCE) 0.6 % SUSP Place 1 drop into both eyes See admin instructions. Instill one drop into both eyes 4 times daily on the day of and the day after eye injections - once a month    [provider]  bimatoprost (LUMIGAN) 0.01 % SOLN Place 1 drop into both eyes at bedtime.    [provider]  Calcium Carbonate-Vitamin D (CALCIUM 600/VITAMIN D) 600-400 MG-UNIT per tablet Take 1 tablet by mouth daily with lunch.  [provider]  iron polysaccharides (FERREX 150) 150 MG capsule Take 1 capsule (150 mg total) by mouth daily. Patient taking differently: Take 150 mg by mouth daily before lunch.  06/12/16   Timmothy Euler, MD  levothyroxine (SYNTHROID, LEVOTHROID) 75 MCG tablet TAKE 1 TABLET (75 MCG TOTAL) BY MOUTH DAILY BEFORE BREAKFAST. Patient taking differently: Take 75 mcg by mouth at bedtime.  10/01/17   [name], DO  Multiple Vitamin (MULTIVITAMIN WITH MINERALS) TABS tablet Take 1 tablet by mouth daily.    [provider]  Omega-3 Fatty Acids (FISH OIL) 1200 MG CAPS Take 1,200 mg by mouth daily with supper.     [provider]   triamcinolone cream (KENALOG) 0.1 % Apply 1 application topically 2 (two) times daily as needed (scalp itching). 12/04/17   Geradine Girt, DO    ALLERGIES:  Allergies  Allergen Reactions  . Aggrenox [Aspirin-Dipyridamole Er] Anaphylaxis    Severe headache  . Codeine Other (See Comments)    Feet burn and agitation  . Morphine And Related Other (See Comments)    Feet burn and agitation    SOCIAL HISTORY:  Social History   Tobacco Use  . Smoking status: Former Research scientist (life sciences)  . Smokeless tobacco: Never Used  . Tobacco comment: quit smoking 72yrs ago  Substance Use Topics  . Alcohol use: No    Alcohol/week: 0.0 standard drinks    FAMILY HISTORY: Family History  Problem Relation Age of Onset  . Prostate cancer Father   . Cancer Father   . Lung disease Sister   . Cancer Brother     EXAM: BP (!) 179/72 (BP Location: Left Arm)   Pulse 73   Temp 97.7 F (36.5 C) (Oral)   Resp 18   SpO2 97%  CONSTITUTIONAL: Alert and oriented and responds appropriately to questions. Well-appearing; well-nourished HEAD: Normocephalic EYES: Conjunctivae clear, pupils appear equal, EOMI ENT: normal nose; moist mucous membranes NECK: Supple, no meningismus, no nuchal rigidity, no LAD  CARD: RRR; S1 and S2 appreciated; no murmurs, no clicks, no rubs, no gallops RESP: Normal chest excursion without splinting or tachypnea; breath sounds clear and equal bilaterally; no wheezes, no rhonchi, no rales, no hypoxia or respiratory distress, speaking full sentences ABD/GI: Normal bowel sounds; non-distended; soft, non-tender, no rebound, no guarding, no peritoneal signs, no hepatosplenomegaly BACK:  The back appears normal and is non-tender to palpation, there is no CVA tenderness EXT: Normal ROM in all joints; non-tender to palpation; no edema; normal capillary refill; no cyanosis, no calf tenderness or swelling    SKIN: Normal color for age and race; warm; no rash NEURO: Moves all extremities equally,  strength 5/5 in all 4 extremities, cranial nerves II through XII intact, normal speech, normal gait PSYCH: The patient's mood and manner are appropriate. Grooming and personal hygiene are appropriate.  MEDICAL DECISION MAKING: Patient here with lightheadedness.  No vertiginous symptoms.  No focal neurologic deficits.  No infectious symptoms.  No chest pain.  Has had extensive work-up for the same which was unremarkable and is scheduled to follow-up with neurology as an outpatient.  Her orthostatic vital signs are normal but she did become very symptomatic with standing upright and I still think that this is related to orthostasis and perhaps changes in her blood pressure medications.  I have advised her to go back to taking her blood pressure medications one in the morning and one at night to see if this helps with symptoms and she will  follow-up closely with her PCP.  She is also found to be slightly hyperglycemic today.  She states she did just eat prior to arrival.  Hemoglobin A1c during the hospitalization was 6.1.  She will follow-up with her primary care physician to discuss if she needs to be started on medication.  She has no history of diabetes.  Doubt stroke, TIA today.  Doubt CAD, PE, dissection.  She does not appear dehydrated based on labs, urine.  No sign of infection.  Patient and daughter-in-law are comfortable with plan for outpatient follow-up.  Discussed at length return precautions.   At this time, I do not feel there is any life-threatening condition present. I have reviewed and discussed all results (EKG, imaging, lab, urine as appropriate) and exam findings with patient/family. I have reviewed nursing notes and appropriate previous records.  I feel the patient is safe to be discharged home without further emergent workup and can continue workup as an outpatient as needed. Discussed usual and customary return precautions. Patient/family verbalize understanding and are comfortable with  this plan.  Outpatient follow-up has been provided if needed. All questions have been answered.      EKG Interpretation  Date/Time:  Monday December 17 2017 21:48:58 EDT Ventricular Rate:  79 PR Interval:  148 QRS Duration: 80 QT Interval:  392 QTC Calculation: 449 R Axis:   56 Text Interpretation:  Normal sinus rhythm Normal ECG No significant change since last tracing Confirmed by Verda Mehta, Cyril Mourning [phone]) on 12/18/2017 2:24:40 AM         Cheney Ewart, Delice Bison, DO 12/18/17 2162

## 2017-12-18 NOTE — ED Notes (Signed)
Patient verbalizes understanding of medications and discharge instructions. No further questions at this time. VSS and patient ambulatory at discharge.   

## 2017-12-24 ENCOUNTER — Encounter: Payer: Self-pay | Admitting: Family Medicine

## 2017-12-24 ENCOUNTER — Ambulatory Visit (INDEPENDENT_AMBULATORY_CARE_PROVIDER_SITE_OTHER): Payer: Federal, State, Local not specified - PPO | Admitting: Family Medicine

## 2017-12-24 VITALS — BP 136/62 | HR 66 | Temp 97.2°F | Ht 63.0 in | Wt 130.0 lb

## 2017-12-24 DIAGNOSIS — I10 Essential (primary) hypertension: Secondary | ICD-10-CM

## 2017-12-24 DIAGNOSIS — K5904 Chronic idiopathic constipation: Secondary | ICD-10-CM

## 2017-12-24 DIAGNOSIS — R42 Dizziness and giddiness: Secondary | ICD-10-CM | POA: Diagnosis not present

## 2017-12-24 MED ORDER — POLYETHYLENE GLYCOL 3350 17 GM/SCOOP PO POWD: ORAL | 1 refills | Status: DC

## 2017-12-24 NOTE — Progress Notes (Signed)
Subjective: CC: Dizziness PCP: [name], DO Jenna Ramos is a 84 y.o. female presenting to clinic today for:  1. Dizziness/ HTN Patient reports resolution in symptoms after she split the amlodipine into the morning and the benazepril at night.  She continues to have intermittent episodes of dizziness with position changes after eating.  She notes this only last about 10 to 15 seconds.  She has an appointment scheduled with neurology for hospital follow-up on her TIA.  She had an episode of dizziness and her blood pressure was in the 170s.  She went to the emergency department and was evaluated there.  She has noticed some ankle edema since increase of Norvasc.  2.  Constipation Patient reports that she has been having pellet-like stools since starting Lipitor.  She is always had problems with bowel movements, citing that she will go every couple of days but typically they are soft and she does not need to strain.  However, recently she has been straining.  She is wondering what she can take to help with this.   ROS: Per HPI  Allergies  Allergen Reactions  . Aggrenox [Aspirin-Dipyridamole Er] Anaphylaxis    Severe headache  . Codeine Other (See Comments)    Feet burn and agitation  . Morphine And Related Other (See Comments)    Feet burn and agitation   Past Medical History:  Diagnosis Date  . Anemia    takes Iron pill daily  . Arthritis   . Carotid artery occlusion   . Fibromyalgia   . Fibromyalgia   . Glaucoma    uses Eye Drops daily  . HOH (hard of hearing)   . Hyperlipidemia    takes Atorvastatin daily  . Hypertension    takes Benazepril daily  . Hypothyroidism    takes Synthroid daily  . Macular degeneration    wet and gets injections in both eyes  . Nocturia   . Peripheral vascular disease ([place])   . Rectal incontinence   . Stroke ([place])   . 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 10/28/2013    Current Outpatient  Medications:  .  Acetaminophen (TYLENOL PO), Take 1-2 tablets by mouth every 6 (six) hours as needed (pain/headache)., Disp: , Rfl:  .  amLODipine (NORVASC) 10 MG tablet, Take 1 tablet (10 mg total) by mouth daily. (Patient taking differently: Take 10 mg by mouth every morning. ), Disp: 90 tablet, Rfl: 1 .  aspirin 325 MG EC tablet, Take 325 mg by mouth daily. , Disp: , Rfl:  .  atorvastatin (LIPITOR) 20 MG tablet, Take 1 tablet (20 mg total) by mouth daily at 6 PM., Disp: 90 tablet, Rfl: 0 .  benazepril (LOTENSIN) 20 MG tablet, TAKE 1 TABLET BY MOUTH EVERY DAY (Patient taking differently: Take 20 mg by mouth every evening. ), Disp: 90 tablet, Rfl: 1 .  Besifloxacin HCl (BESIVANCE) 0.6 % SUSP, Place 1 drop into both eyes See admin instructions. Instill one drop into both eyes 4 times daily on the day of and the day after eye injections - once a month, Disp: , Rfl:  .  bimatoprost (LUMIGAN) 0.01 % SOLN, Place 1 drop into both eyes at bedtime., Disp: , Rfl:  .  Calcium Carbonate-Vitamin D (CALCIUM 600/VITAMIN D) 600-400 MG-UNIT per tablet, Take 1 tablet by mouth daily with lunch. , Disp: , Rfl:  .  iron polysaccharides (FERREX 150) 150 MG capsule, Take 1 capsule (150 mg total) by mouth daily. (Patient  taking differently: Take 150 mg by mouth daily before lunch. ), Disp: 60 capsule, Rfl: 3 .  levothyroxine (SYNTHROID, LEVOTHROID) 75 MCG tablet, TAKE 1 TABLET (75 MCG TOTAL) BY MOUTH DAILY BEFORE BREAKFAST. (Patient taking differently: Take 75 mcg by mouth at bedtime. ), Disp: 90 tablet, Rfl: 2 .  Multiple Vitamin (MULTIVITAMIN WITH MINERALS) TABS tablet, Take 1 tablet by mouth daily., Disp: , Rfl:  .  Omega-3 Fatty Acids (FISH OIL) 1200 MG CAPS, Take 1,200 mg by mouth daily with supper. , Disp: , Rfl:  .  triamcinolone cream (KENALOG) 0.1 %, Apply 1 application topically 2 (two) times daily as needed (scalp itching)., Disp: , Rfl:  Social History   Socioeconomic History  . Marital status: Widowed     Spouse name: Not on file  . Number of children: Not on file  . Years of education: Not on file  . Highest education level: Not on file  Occupational History  . Not on file  Social Needs  . Financial resource strain: Not on file  . Food insecurity:    Worry: Not on file    Inability: Not on file  . Transportation needs:    Medical: Not on file    Non-medical: Not on file  Tobacco Use  . Smoking status: Former Research scientist (life sciences)  . Smokeless tobacco: Never Used  . Tobacco comment: quit smoking 22yrs ago  Substance and Sexual Activity  . Alcohol use: No    Alcohol/week: 0.0 standard drinks  . Drug use: No  . Sexual activity: Yes    Birth control/protection: Post-menopausal  Lifestyle  . Physical activity:    Days per week: Not on file    Minutes per session: Not on file  . Stress: Not on file  Relationships  . Social connections:    Talks on phone: Not on file    Gets together: Not on file    Attends religious service: Not on file    Active member of club or organization: Not on file    Attends meetings of clubs or organizations: Not on file    Relationship status: Not on file  . Intimate partner violence:    Fear of current or ex partner: Not on file    Emotionally abused: Not on file    Physically abused: Not on file    Forced sexual activity: Not on file  Other Topics Concern  . Not on file  Social History Narrative  . Not on file   Family History  Problem Relation Age of Onset  . Prostate cancer Father   . Cancer Father   . Lung disease Sister   . Cancer Brother     Objective: Office vital signs reviewed. BP 136/62   Pulse 66   Temp (!) 97.2 F (36.2 C) (Oral)   Ht 5\' 3"  (1.6 m)   Wt 130 lb (59 kg)   BMI 23.03 kg/m   Physical Examination:  General: Awake, alert, well nourished, No acute distress HEENT: Normal, MMM Extremities: warm, well perfused, trace ankle edema. No cyanosis or clubbing; +2 pulses bilaterally MSK: normal gait and station Neuro: no focal  neurologic deficits  Assessment/ Plan: 84 y.o. female   1. Essential hypertension Blood pressure under good control on current regimen.  We discussed decreasing Norvasc and adding an additional antihypertensive agent versus continuing the Norvasc and she wear compression hose and elevate feet.  She does not wish to add additional medication at this time.  She will follow-up with me  as needed.  2. Dizziness Resolved since splitting her medication to morning and evening doses.  She continues to have some intermittent orthostatic changes.  We discussed compression hose.  3. Chronic idiopathic constipation MiraLAX prescribed.  Instructions for use discussed with the patient.  Follow-up PRN.   No orders of the defined types were placed in this encounter.  Meds ordered this encounter  Medications  . polyethylene glycol powder (GLYCOLAX/MIRALAX) powder    Sig: Mix 1 capful (17g) in 8 ounces of water and drink 1 to 2 times daily as needed for constipation.    Dispense:  225 g    Refill:  [place], [place] [phone]

## 2017-12-24 NOTE — Patient Instructions (Signed)
I did not see where constipation was a side effect of Lipitor.  If anything, Lipitor would cause diarrhea.  I have prescribed you MiraLAX powder to use up to twice daily if needed for constipation.  We discussed that the amlodipine may be causing your swelling in the ankles.  We can either decrease the amlodipine and add a new medication or continue the amlodipine and you use compression hose to help the fluid in your ankles.   Constipation, Adult Constipation is when a person:  Poops (has a bowel movement) fewer times in a week than normal.  Has a hard time pooping.  Has poop that is dry, hard, or bigger than normal.  Follow these instructions at home: Eating and drinking   Eat foods that have a lot of fiber, such as: ? Fresh fruits and vegetables. ? Whole grains. ? Beans.  Eat less of foods that are high in fat, low in fiber, or overly processed, such as: ? [place] fries. ? Hamburgers. ? Cookies. ? Candy. ? Soda.  Drink enough fluid to keep your pee (urine) clear or pale yellow. General instructions  Exercise regularly or as told by your doctor.  Go to the restroom when you feel like you need to poop. Do not hold it in.  Take over-the-counter and prescription medicines only as told by your doctor. These include any fiber supplements.  Do pelvic floor retraining exercises, such as: ? Doing deep breathing while relaxing your lower belly (abdomen). ? Relaxing your pelvic floor while pooping.  Watch your condition for any changes.  Keep all follow-up visits as told by your doctor. This is important. Contact a doctor if:  You have pain that gets worse.  You have a fever.  You have not pooped for 4 days.  You throw up (vomit).  You are not hungry.  You lose weight.  You are bleeding from the anus.  You have thin, pencil-like poop (stool). Get help right away if:  You have a fever, and your symptoms suddenly get worse.  You leak poop or have blood in your  poop.  Your belly feels hard or bigger than normal (is bloated).  You have very bad belly pain.  You feel dizzy or you faint. This information is not intended to replace advice given to you by your health care provider. Make sure you discuss any questions you have with your health care provider. Document Released: 08/09/2007 Document Revised: 09/10/2015 Document Reviewed: 08/11/2015 Elsevier Interactive Patient Education  2018 [employer].  Edema Edema is when you have too much fluid in your body or under your skin. Edema may make your legs, feet, and ankles swell up. Swelling is also common in looser tissues, like around your eyes. This is a common condition. It gets more common as you get older. There are many possible causes of edema. Eating too much salt (sodium) and being on your feet or sitting for a long time can cause edema in your legs, feet, and ankles. Hot weather may make edema worse. Edema is usually painless. Your skin may look swollen or shiny. Follow these instructions at home:  Keep the swollen body part raised (elevated) above the level of your heart when you are sitting or lying down.  Do not sit still or stand for a long time.  Do not wear tight clothes. Do not wear garters on your upper legs.  Exercise your legs. This can help the swelling go down.  Wear elastic bandages or support stockings  as told by your doctor.  Eat a low-salt (low-sodium) diet to reduce fluid as told by your doctor.  Depending on the cause of your swelling, you may need to limit how much fluid you drink (fluid restriction).  Take over-the-counter and prescription medicines only as told by your doctor. Contact a doctor if:  Treatment is not working.  You have heart, liver, or kidney disease and have symptoms of edema.  You have sudden and unexplained weight gain. Get help right away if:  You have shortness of breath or chest pain.  You cannot breathe when you lie down.  You have  pain, redness, or warmth in the swollen areas.  You have heart, liver, or kidney disease and get edema all of a sudden.  You have a fever and your symptoms get worse all of a sudden. Summary  Edema is when you have too much fluid in your body or under your skin.  Edema may make your legs, feet, and ankles swell up. Swelling is also common in looser tissues, like around your eyes.  Raise (elevate) the swollen body part above the level of your heart when you are sitting or lying down.  Follow your doctor's instructions about diet and how much fluid you can drink (fluid restriction). This information is not intended to replace advice given to you by your health care provider. Make sure you discuss any questions you have with your health care provider. Document Released: 08/09/2007 Document Revised: 03/10/2016 Document Reviewed: 03/10/2016 Elsevier Interactive Patient Education  2017 [employer].

## 2018-01-08 ENCOUNTER — Encounter (INDEPENDENT_AMBULATORY_CARE_PROVIDER_SITE_OTHER): Payer: Federal, State, Local not specified - PPO | Admitting: Ophthalmology

## 2018-01-09 ENCOUNTER — Encounter (INDEPENDENT_AMBULATORY_CARE_PROVIDER_SITE_OTHER): Payer: Federal, State, Local not specified - PPO | Admitting: Ophthalmology

## 2018-01-09 DIAGNOSIS — I10 Essential (primary) hypertension: Secondary | ICD-10-CM

## 2018-01-09 DIAGNOSIS — H43813 Vitreous degeneration, bilateral: Secondary | ICD-10-CM

## 2018-01-09 DIAGNOSIS — H35033 Hypertensive retinopathy, bilateral: Secondary | ICD-10-CM

## 2018-01-09 DIAGNOSIS — H353231 Exudative age-related macular degeneration, bilateral, with active choroidal neovascularization: Secondary | ICD-10-CM

## 2018-01-22 ENCOUNTER — Ambulatory Visit: Payer: Federal, State, Local not specified - PPO | Admitting: Neurology

## 2018-01-22 ENCOUNTER — Encounter: Payer: Self-pay | Admitting: Neurology

## 2018-01-22 VITALS — BP 148/64 | HR 75 | Ht 63.0 in | Wt 130.0 lb

## 2018-01-22 DIAGNOSIS — G459 Transient cerebral ischemic attack, unspecified: Secondary | ICD-10-CM

## 2018-01-22 NOTE — Progress Notes (Signed)
[facility] [address] [place]. [place] [address] [phone]       OFFICE CONSULT NOTE  Jenna. Jenna Ramos Date of Birth:  December 06, 1928 Medical Record Number:  [id number]   Referring MD: Adam Phenix, DO  Reason for Referral: TIA  HPI: Jenna Ramos is a 84 year old pleasant Caucasian lady seen today for initial office consultation visit.  History is obtained from her as well as review of electronic medical records.  I personally reviewed imaging films in PACS.  Patient was admitted to [facility] on 12/03/2017 with symptoms of dizziness, lightheadedness, left hand tingling numbness and some speech difficulties.  She is convinced her symptoms are related to taking too blood pressure medication simultaneously as well she had increased her dose of blood pressure medicine just prior to that episode.  She was noted as dragging the right leg while walking but the patient states that she has fibromyalgia and at times she drags her leg.  MRI scan of the brain was obtained which showed old remote is left internal capsule/thalamic lacunar infarct.  No acute abnormality was noted.  There is changes of small vessel disease.  There also changes of several chronic micro bleeds on the gradient echo images from small vessel disease.  Carotid ultrasound on 12/04/2017 showed no significant extracranial stenosis and transthoracic echo was also unremarkable.  LDL cholesterol was 140 mg percent and hemoglobin A1c was 6.1.  Patient was started on aspirin for stroke prevention which is tolerating well without bruising or bleeding.  She denies significant episodes suggestive of stroke or TIA in the past but does state that in 2015 she went to the ER once when she had some trouble speaking.  Review of imaging studies at [facility] in 2015 showed remote is left internal capsule infarct no acute abnormality.  Patient does still complain of intermittent mild dizziness but she feels  this is improved after she has started spreading out her to antihypertensives and not taking them together.  She is living at home she is independent in activities of daily living.  She has no other new complaints today.  She was seen again in the emergency room at [facility] in the middle of October for lightheadedness which was blamed to be related to her blood pressure medicines.  ROS:   14 system review of systems is positive for hearing loss, ringing in the ears, constipation, decreased vision, dizziness and all other systems negative  PMH:  Past Medical History:  Diagnosis Date  . Anemia    takes Iron pill daily  . Arthritis   . Carotid artery occlusion   . Fibromyalgia   . Fibromyalgia   . Glaucoma    uses Eye Drops daily  . HOH (hard of hearing)   . Hyperlipidemia    takes Atorvastatin daily  . Hypertension    takes Benazepril daily  . Hypothyroidism    takes Synthroid daily  . Macular degeneration    wet and gets injections in both eyes  . Nocturia   . Peripheral vascular disease ([place])   . Rectal incontinence   . Stroke ([place])   . 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 10/28/2013    Social History:  Social History   Socioeconomic History  . Marital status: Widowed    Spouse name: Not on file  . Number of children: Not on file  . Years of education: Not on file  . Highest education level: Not on file  Occupational History  .  Not on file  Social Needs  . Financial resource strain: Not on file  . Food insecurity:    Worry: Not on file    Inability: Not on file  . Transportation needs:    Medical: Not on file    Non-medical: Not on file  Tobacco Use  . Smoking status: Former Research scientist (life sciences)  . Smokeless tobacco: Never Used  . Tobacco comment: quit smoking 88yrs ago  Substance and Sexual Activity  . Alcohol use: No    Alcohol/week: 0.0 standard drinks  . Drug use: No  . Sexual activity: Yes    Birth control/protection: Post-menopausal  Lifestyle   . Physical activity:    Days per week: Not on file    Minutes per session: Not on file  . Stress: Not on file  Relationships  . Social connections:    Talks on phone: Not on file    Gets together: Not on file    Attends religious service: Not on file    Active member of club or organization: Not on file    Attends meetings of clubs or organizations: Not on file    Relationship status: Not on file  . Intimate partner violence:    Fear of current or ex partner: Not on file    Emotionally abused: Not on file    Physically abused: Not on file    Forced sexual activity: Not on file  Other Topics Concern  . Not on file  Social History Narrative  . Not on file    Medications:   Current Outpatient Medications on File Prior to Visit  Medication Sig Dispense Refill  . Acetaminophen (TYLENOL PO) Take 1-2 tablets by mouth every 6 (six) hours as needed (pain/headache).    Marland Kitchen amLODipine (NORVASC) 10 MG tablet Take 1 tablet (10 mg total) by mouth daily. (Patient taking differently: Take 10 mg by mouth every morning. ) 90 tablet 1  . aspirin 325 MG EC tablet Take 325 mg by mouth daily.     Marland Kitchen atorvastatin (LIPITOR) 20 MG tablet Take 1 tablet (20 mg total) by mouth daily at 6 PM. 90 tablet 0  . benazepril (LOTENSIN) 20 MG tablet TAKE 1 TABLET BY MOUTH EVERY DAY (Patient taking differently: Take 20 mg by mouth every evening. ) 90 tablet 1  . Besifloxacin HCl (BESIVANCE) 0.6 % SUSP Place 1 drop into both eyes See admin instructions. Instill one drop into both eyes 4 times daily on the day of and the day after eye injections - once a month    . bimatoprost (LUMIGAN) 0.01 % SOLN Place 1 drop into both eyes at bedtime.    . Calcium Carbonate-Vitamin D (CALCIUM 600/VITAMIN D) 600-400 MG-UNIT per tablet Take 1 tablet by mouth daily with lunch.     . iron polysaccharides (FERREX 150) 150 MG capsule Take 1 capsule (150 mg total) by mouth daily. (Patient taking differently: Take 150 mg by mouth daily before  lunch. ) 60 capsule 3  . levothyroxine (SYNTHROID, LEVOTHROID) 75 MCG tablet TAKE 1 TABLET (75 MCG TOTAL) BY MOUTH DAILY BEFORE BREAKFAST. (Patient taking differently: Take 75 mcg by mouth at bedtime. ) 90 tablet 2  . Multiple Vitamin (MULTIVITAMIN WITH MINERALS) TABS tablet Take 1 tablet by mouth daily.    . Omega-3 Fatty Acids (FISH OIL) 1200 MG CAPS Take 1,200 mg by mouth daily with supper.     . polyethylene glycol powder (GLYCOLAX/MIRALAX) powder Mix 1 capful (17g) in 8 ounces of water and drink  1 to 2 times daily as needed for constipation. 225 g 1  . triamcinolone cream (KENALOG) 0.1 % Apply 1 application topically 2 (two) times daily as needed (scalp itching).     No current facility-administered medications on file prior to visit.     Allergies:   Allergies  Allergen Reactions  . Aggrenox [Aspirin-Dipyridamole Er] Anaphylaxis    Severe headache  . Codeine Other (See Comments)    Feet burn and agitation  . Morphine And Related Other (See Comments)    Feet burn and agitation    Physical Exam General: Frail petite elderly Caucasian lady, seated, in no evident distress Head: head normocephalic and atraumatic.   Neck: supple with soft right carotid   bruit Cardiovascular: regular rate and rhythm, no murmurs Musculoskeletal: no deformity Skin:  no rash/petichiae Vascular:  Normal pulses all extremities  Neurologic Exam Mental Status: Awake and fully alert. Oriented to place and time. Recent and remote memory intact. Attention span, concentration and fund of knowledge appropriate. Mood and affect appropriate.  Cranial Nerves: Fundoscopic exam reveals sharp disc margins. Pupils equal, briskly reactive to light. Extraocular movements full without nystagmus. Visual fields full to confrontation. Hearing intact. Facial sensation intact. Face, tongue, palate moves normally and symmetrically.  Motor: Normal bulk and tone. Normal strength in all tested extremity muscles. Sensory.: intact  to touch , pinprick , position and vibratory sensation.  Coordination: Rapid alternating movements normal in all extremities. Finger-to-nose and heel-to-shin performed accurately bilaterally. Gait and Station: Arises from chair without difficulty. Stance is normal. Gait demonstrates normal stride length and balance . Able to heel, toe and tandem walk with moderate t difficulty.  Reflexes: 1+ and symmetric. Toes downgoing.   NIHSS  0 Modified Rankin  1   ASSESSMENT: 84 year old lady with transient symptoms of tingling in the left hand and some trouble speaking in October 2019 possibly a TIA from small vessel disease though the patient seems convinced this was related to medication effect of taking 2 antihypertensives together.  Brain MRI shows remote age left internal capsule infarct.  Vascular risk factors of hypertension, hyperlipidemia and age.     PLAN: I had a long d/w patient about her remote silent lacunar stroke, risk for recurrent stroke/TIAs, personally independently reviewed imaging studies and stroke evaluation results and answered questions.Continue aspirin 325 mg daily  for secondary stroke prevention and maintain strict control of hypertension with blood pressure goal below 130/90, diabetes with hemoglobin A1c goal below 6.5% and lipids with LDL cholesterol goal below 70 mg/dL. I also advised the patient to eat a healthy diet with plenty of whole grains, cereals, fruits and vegetables, exercise regularly and maintain ideal body weight.  Greater than 50% time during this 45-minute consultation visit was spent on counseling and coordination of care about her remote silent lacunar infarct and discussion about stroke risk factors and stroke prevention and answering questions followup in the future with me only as necessary and no scheduled appointment was made Antony Contras, MD  [facility] [address] [place], [place] [address]  Phone [phone]  Fax [phone] Note: This document was prepared with digital dictation and possible smart phrase technology. Any transcriptional errors that result from this process are unintentional.

## 2018-01-22 NOTE — Patient Instructions (Signed)
I had a long d/w patient about her remote silent lacunar stroke, risk for recurrent stroke/TIAs, personally independently reviewed imaging studies and stroke evaluation results and answered questions.Continue aspirin 325 mg daily  for secondary stroke prevention and maintain strict control of hypertension with blood pressure goal below 130/90, diabetes with hemoglobin A1c goal below 6.5% and lipids with LDL cholesterol goal below 70 mg/dL. I also advised the patient to eat a healthy diet with plenty of whole grains, cereals, fruits and vegetables, exercise regularly and maintain ideal body weight Followup in the future with me only as necessary and no scheduled appointment was made  Stroke Prevention Some medical conditions and behaviors are associated with a higher chance of having a stroke. You can help prevent a stroke by making nutrition, lifestyle, and other changes, including managing any medical conditions you may have. What nutrition changes can be made?  Eat healthy foods. You can do this by: ? Choosing foods high in fiber, such as fresh fruits and vegetables and whole grains. ? Eating at least 5 or more servings of fruits and vegetables a day. Try to fill half of your plate at each meal with fruits and vegetables. ? Choosing lean protein foods, such as lean cuts of meat, poultry without skin, fish, tofu, beans, and nuts. ? Eating low-fat dairy products. ? Avoiding foods that are high in salt (sodium). This can help lower blood pressure. ? Avoiding foods that have saturated fat, trans fat, and cholesterol. This can help prevent high cholesterol. ? Avoiding processed and premade foods.  Follow your health care provider's specific guidelines for losing weight, controlling high blood pressure (hypertension), lowering high cholesterol, and managing diabetes. These may include: ? Reducing your daily calorie intake. ? Limiting your daily sodium intake to 1,500 milligrams (mg). ? Using only  healthy fats for cooking, such as olive oil, canola oil, or sunflower oil. ? Counting your daily carbohydrate intake. What lifestyle changes can be made?  Maintain a healthy weight. Talk to your health care provider about your ideal weight.  Get at least 30 minutes of moderate physical activity at least 5 days a week. Moderate activity includes brisk walking, biking, and swimming.  Do not use any products that contain nicotine or tobacco, such as cigarettes and e-cigarettes. If you need help quitting, ask your health care provider. It may also be helpful to avoid exposure to secondhand smoke.  Limit alcohol intake to no more than 1 drink a day for nonpregnant women and 2 drinks a day for men. One drink equals 12 oz of beer, 5 oz of wine, or 1 oz of hard liquor.  Stop any illegal drug use.  Avoid taking birth control pills. Talk to your health care provider about the risks of taking birth control pills if: ? You are over 8 years old. ? You smoke. ? You get migraines. ? You have ever had a blood clot. What other changes can be made?  Manage your cholesterol levels. ? Eating a healthy diet is important for preventing high cholesterol. If cholesterol cannot be managed through diet alone, you may also need to take medicines. ? Take any prescribed medicines to control your cholesterol as told by your health care provider.  Manage your diabetes. ? Eating a healthy diet and exercising regularly are important parts of managing your blood sugar. If your blood sugar cannot be managed through diet and exercise, you may need to take medicines. ? Take any prescribed medicines to control your diabetes as told  by your health care provider.  Control your hypertension. ? To reduce your risk of stroke, try to keep your blood pressure below 130/80. ? Eating a healthy diet and exercising regularly are an important part of controlling your blood pressure. If your blood pressure cannot be managed through  diet and exercise, you may need to take medicines. ? Take any prescribed medicines to control hypertension as told by your health care provider. ? Ask your health care provider if you should monitor your blood pressure at home. ? Have your blood pressure checked every year, even if your blood pressure is normal. Blood pressure increases with age and some medical conditions.  Get evaluated for sleep disorders (sleep apnea). Talk to your health care provider about getting a sleep evaluation if you snore a lot or have excessive sleepiness.  Take over-the-counter and prescription medicines only as told by your health care provider. Aspirin or blood thinners (antiplatelets or anticoagulants) may be recommended to reduce your risk of forming blood clots that can lead to stroke.  Make sure that any other medical conditions you have, such as atrial fibrillation or atherosclerosis, are managed. What are the warning signs of a stroke? The warning signs of a stroke can be easily remembered as BEFAST.  B is for balance. Signs include: ? Dizziness. ? Loss of balance or coordination. ? Sudden trouble walking.  E is for eyes. Signs include: ? A sudden change in vision. ? Trouble seeing.  F is for face. Signs include: ? Sudden weakness or numbness of the face. ? The face or eyelid drooping to one side.  A is for arms. Signs include: ? Sudden weakness or numbness of the arm, usually on one side of the body.  S is for speech. Signs include: ? Trouble speaking (aphasia). ? Trouble understanding.  T is for time. ? These symptoms may represent a serious problem that is an emergency. Do not wait to see if the symptoms will go away. Get medical help right away. Call your local emergency services (911 in the [place] Do not drive yourself to the hospital.  Other signs of stroke may include: ? A sudden, severe headache with no known cause. ? Nausea or vomiting. ? Seizure.  Where to find more  information: For more information, visit:  American Stroke Association: www.strokeassociation.org  National Stroke Association: www.stroke.org  Summary  You can prevent a stroke by eating healthy, exercising, not smoking, limiting alcohol intake, and managing any medical conditions you may have.  Do not use any products that contain nicotine or tobacco, such as cigarettes and e-cigarettes. If you need help quitting, ask your health care provider. It may also be helpful to avoid exposure to secondhand smoke.  Remember BEFAST for warning signs of stroke. Get help right away if you or a loved one has any of these signs. This information is not intended to replace advice given to you by your health care provider. Make sure you discuss any questions you have with your health care provider. Document Released: 03/30/2004 Document Revised: 03/28/2016 Document Reviewed: 03/28/2016 Elsevier Interactive Patient Education  [employer].

## 2018-01-30 ENCOUNTER — Other Ambulatory Visit: Payer: Self-pay | Admitting: *Deleted

## 2018-01-30 MED ORDER — BENAZEPRIL HCL 20 MG PO TABS: 20.0000 mg | ORAL_TABLET | Freq: Every day | ORAL | 1 refills | Status: DC

## 2018-02-01 ENCOUNTER — Other Ambulatory Visit: Payer: Self-pay | Admitting: Family Medicine

## 2018-02-06 ENCOUNTER — Encounter (INDEPENDENT_AMBULATORY_CARE_PROVIDER_SITE_OTHER): Payer: Federal, State, Local not specified - PPO | Admitting: Ophthalmology

## 2018-02-07 ENCOUNTER — Encounter (INDEPENDENT_AMBULATORY_CARE_PROVIDER_SITE_OTHER): Payer: Federal, State, Local not specified - PPO | Admitting: Ophthalmology

## 2018-02-13 ENCOUNTER — Encounter (INDEPENDENT_AMBULATORY_CARE_PROVIDER_SITE_OTHER): Payer: Federal, State, Local not specified - PPO | Admitting: Ophthalmology

## 2018-02-13 DIAGNOSIS — H43813 Vitreous degeneration, bilateral: Secondary | ICD-10-CM

## 2018-02-13 DIAGNOSIS — H353231 Exudative age-related macular degeneration, bilateral, with active choroidal neovascularization: Secondary | ICD-10-CM | POA: Diagnosis not present

## 2018-02-13 DIAGNOSIS — I10 Essential (primary) hypertension: Secondary | ICD-10-CM

## 2018-02-13 DIAGNOSIS — H35033 Hypertensive retinopathy, bilateral: Secondary | ICD-10-CM | POA: Diagnosis not present

## 2018-03-13 ENCOUNTER — Encounter (INDEPENDENT_AMBULATORY_CARE_PROVIDER_SITE_OTHER): Payer: Federal, State, Local not specified - PPO | Admitting: Ophthalmology

## 2018-03-13 ENCOUNTER — Ambulatory Visit: Payer: Federal, State, Local not specified - PPO | Admitting: Family Medicine

## 2018-03-13 DIAGNOSIS — H353231 Exudative age-related macular degeneration, bilateral, with active choroidal neovascularization: Secondary | ICD-10-CM

## 2018-03-13 DIAGNOSIS — I10 Essential (primary) hypertension: Secondary | ICD-10-CM

## 2018-03-13 DIAGNOSIS — H35033 Hypertensive retinopathy, bilateral: Secondary | ICD-10-CM | POA: Diagnosis not present

## 2018-03-13 DIAGNOSIS — H43813 Vitreous degeneration, bilateral: Secondary | ICD-10-CM

## 2018-03-18 ENCOUNTER — Encounter: Payer: Self-pay | Admitting: Family Medicine

## 2018-03-18 ENCOUNTER — Ambulatory Visit: Payer: Federal, State, Local not specified - PPO | Admitting: Family Medicine

## 2018-03-18 VITALS — BP 140/52 | HR 73 | Temp 97.0°F | Ht 63.0 in | Wt 132.0 lb

## 2018-03-18 DIAGNOSIS — Z23 Encounter for immunization: Secondary | ICD-10-CM | POA: Diagnosis not present

## 2018-03-18 DIAGNOSIS — B9689 Other specified bacterial agents as the cause of diseases classified elsewhere: Secondary | ICD-10-CM | POA: Diagnosis not present

## 2018-03-18 DIAGNOSIS — N760 Acute vaginitis: Secondary | ICD-10-CM

## 2018-03-18 DIAGNOSIS — N898 Other specified noninflammatory disorders of vagina: Secondary | ICD-10-CM | POA: Diagnosis not present

## 2018-03-18 DIAGNOSIS — I10 Essential (primary) hypertension: Secondary | ICD-10-CM

## 2018-03-18 LAB — WET PREP FOR TRICH, YEAST, CLUE
Clue Cell Exam: POSITIVE — AB
TRICHOMONAS EXAM: NEGATIVE
YEAST EXAM: POSITIVE — AB

## 2018-03-18 MED ORDER — METRONIDAZOLE 500 MG PO TABS: 500.0000 mg | ORAL_TABLET | Freq: Two times a day (BID) | ORAL | 0 refills | Status: DC

## 2018-03-18 NOTE — Progress Notes (Signed)
Subjective: CC: Vaginal odor PCP: Jenna Norlander, DO FXT:[id number] [name] is a 85 y.o. female presenting to clinic today for:  1. Vaginal odor Patient reports onset of mild vaginal odor about a year ago.  She notes after the last several weeks it has gotten much worse.  She describes it as a foul odor but not necessarily fishy.  Denies any abnormal vaginal discharge or bleeding.  Denies any dysuria or hematuria.  She has been using a vaginal wash or antibacterial soap in the affected area which does seem to relieve it but it continues to return.  Denies any douching.  She is not sexually active and has not been since the passing of her husband several years ago.  2.  Hypertension Patient reports compliance with antihypertensives.  Denies any chest pain, shortness of breath or strokelike symptoms.   ROS: Per HPI  Allergies  Allergen Reactions  . Aggrenox [Aspirin-Dipyridamole Er] Anaphylaxis    Severe headache  . Codeine Other (See Comments)    Feet burn and agitation  . Morphine And Related Other (See Comments)    Feet burn and agitation   Past Medical History:  Diagnosis Date  . Anemia    takes Iron pill daily  . Arthritis   . Carotid artery occlusion   . Fibromyalgia   . Fibromyalgia   . Glaucoma    uses Eye Drops daily  . HOH (hard of hearing)   . Hyperlipidemia    takes Atorvastatin daily  . Hypertension    takes Benazepril daily  . Hypothyroidism    takes Synthroid daily  . Macular degeneration    wet and gets injections in both eyes  . Nocturia   . Peripheral vascular disease ([place])   . Rectal incontinence   . Stroke ([place])   . 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 10/28/2013    Current Outpatient Medications:  .  Acetaminophen (TYLENOL PO), Take 1-2 tablets by mouth every 6 (six) hours as needed (pain/headache)., Disp: , Rfl:  .  amLODipine (NORVASC) 10 MG tablet, Take 1 tablet (10 mg total) by mouth daily. (Patient taking  differently: Take 10 mg by mouth every morning. ), Disp: 90 tablet, Rfl: 1 .  aspirin 325 MG EC tablet, Take 325 mg by mouth daily. , Disp: , Rfl:  .  atorvastatin (LIPITOR) 20 MG tablet, Take 1 tablet (20 mg total) by mouth daily at 6 PM., Disp: 90 tablet, Rfl: 0 .  benazepril (LOTENSIN) 20 MG tablet, Take 1 tablet (20 mg total) by mouth daily., Disp: 90 tablet, Rfl: 1 .  Besifloxacin HCl (BESIVANCE) 0.6 % SUSP, Place 1 drop into both eyes See admin instructions. Instill one drop into both eyes 4 times daily on the day of and the day after eye injections - once a month, Disp: , Rfl:  .  bimatoprost (LUMIGAN) 0.01 % SOLN, Place 1 drop into both eyes at bedtime., Disp: , Rfl:  .  Calcium Carbonate-Vitamin D (CALCIUM 600/VITAMIN D) 600-400 MG-UNIT per tablet, Take 1 tablet by mouth daily with lunch. , Disp: , Rfl:  .  iron polysaccharides (FERREX 150) 150 MG capsule, TAKE 1 CAPSULE BY MOUTH TWICE A DAY BEFORE LUNCH AND SUPPER AS INSTRUCTED, Disp: 180 capsule, Rfl: 0 .  levothyroxine (SYNTHROID, LEVOTHROID) 75 MCG tablet, TAKE 1 TABLET (75 MCG TOTAL) BY MOUTH DAILY BEFORE BREAKFAST. (Patient taking differently: Take 75 mcg by mouth at bedtime. ), Disp: 90 tablet, Rfl: 2 .  Multiple Vitamin (  MULTIVITAMIN WITH MINERALS) TABS tablet, Take 1 tablet by mouth daily., Disp: , Rfl:  .  Omega-3 Fatty Acids (FISH OIL) 1200 MG CAPS, Take 1,200 mg by mouth daily with supper. , Disp: , Rfl:  .  polyethylene glycol powder (GLYCOLAX/MIRALAX) powder, Mix 1 capful (17g) in 8 ounces of water and drink 1 to 2 times daily as needed for constipation., Disp: 225 g, Rfl: 1 .  triamcinolone cream (KENALOG) 0.1 %, Apply 1 application topically 2 (two) times daily as needed (scalp itching)., Disp: , Rfl:  Social History   Socioeconomic History  . Marital status: Widowed    Spouse name: Not on file  . Number of children: Not on file  . Years of education: Not on file  . Highest education level: Not on file  Occupational  History  . Not on file  Social Needs  . Financial resource strain: Not on file  . Food insecurity:    Worry: Not on file    Inability: Not on file  . Transportation needs:    Medical: Not on file    Non-medical: Not on file  Tobacco Use  . Smoking status: Former Research scientist (life sciences)  . Smokeless tobacco: Never Used  . Tobacco comment: quit smoking 40yrs ago  Substance and Sexual Activity  . Alcohol use: No    Alcohol/week: 0.0 standard drinks  . Drug use: No  . Sexual activity: Yes    Birth control/protection: Post-menopausal  Lifestyle  . Physical activity:    Days per week: Not on file    Minutes per session: Not on file  . Stress: Not on file  Relationships  . Social connections:    Talks on phone: Not on file    Gets together: Not on file    Attends religious service: Not on file    Active member of club or organization: Not on file    Attends meetings of clubs or organizations: Not on file    Relationship status: Not on file  . Intimate partner violence:    Fear of current or ex partner: Not on file    Emotionally abused: Not on file    Physically abused: Not on file    Forced sexual activity: Not on file  Other Topics Concern  . Not on file  Social History Narrative  . Not on file   Family History  Problem Relation Age of Onset  . Prostate cancer Father   . Cancer Father   . Lung disease Sister   . Cancer Brother     Objective: Office vital signs reviewed. BP (!) 140/52   Pulse 73   Temp (!) 97 F (36.1 C) (Oral)   Ht 5\' 3"  (1.6 m)   Wt 132 lb (59.9 kg)   BMI 23.38 kg/m   Physical Examination:  General: Awake, alert, well nourished, No acute distress GI: soft, non-tender, non-distended GU: external vaginal tissue atrophic, cervix midline, no punctate lesions on cervix appreciated, scant yellow creamy discharge from cervical os, no bleeding, no cervical motion tenderness, no abdominal/ adnexal masses  Assessment/ Plan: 85 y.o. female   1. Bacterial  vaginosis Wet prep with evidence of bacterial vaginosis.  Flagyl 500 mg p.o. twice daily for the next 7 days prescribed.  She will follow-up PRN  2. Vaginal odor Looks to be secondary to bacterial vaginosis - WET PREP FOR TRICH, YEAST, CLUE  3. Essential hypertension Blood pressure under good control.  Continue current medications.  No changes made.   Orders Placed  This Encounter  Procedures  . WET PREP FOR TRICH, YEAST, CLUE   Meds ordered this encounter  Medications  . metroNIDAZOLE (FLAGYL) 500 MG tablet    Sig: Take 1 tablet (500 mg total) by mouth 2 (two) times daily.    Dispense:  14 tablet    Refill:  [place], DO [place] [phone]

## 2018-03-18 NOTE — Addendum Note (Signed)
Addended byCarrolyn Leigh on: 03/18/2018 02:41 PM   Modules accepted: Orders

## 2018-03-30 ENCOUNTER — Other Ambulatory Visit: Payer: Self-pay | Admitting: Family Medicine

## 2018-04-12 ENCOUNTER — Encounter (INDEPENDENT_AMBULATORY_CARE_PROVIDER_SITE_OTHER): Payer: Federal, State, Local not specified - PPO | Admitting: Ophthalmology

## 2018-04-12 DIAGNOSIS — H43813 Vitreous degeneration, bilateral: Secondary | ICD-10-CM

## 2018-04-12 DIAGNOSIS — H353231 Exudative age-related macular degeneration, bilateral, with active choroidal neovascularization: Secondary | ICD-10-CM | POA: Diagnosis not present

## 2018-04-12 DIAGNOSIS — I10 Essential (primary) hypertension: Secondary | ICD-10-CM | POA: Diagnosis not present

## 2018-04-12 DIAGNOSIS — H35033 Hypertensive retinopathy, bilateral: Secondary | ICD-10-CM | POA: Diagnosis not present

## 2018-04-19 ENCOUNTER — Encounter: Payer: Self-pay | Admitting: Family

## 2018-04-19 ENCOUNTER — Ambulatory Visit (HOSPITAL_COMMUNITY)
Admission: RE | Admit: 2018-04-19 | Discharge: 2018-04-19 | Disposition: A | Discharge status: Home / Self Care | Payer: Federal, State, Local not specified - PPO | Source: Ambulatory Visit | Attending: Vascular Surgery | Admitting: Vascular Surgery

## 2018-04-19 ENCOUNTER — Other Ambulatory Visit: Payer: Self-pay

## 2018-04-19 ENCOUNTER — Ambulatory Visit: Payer: Federal, State, Local not specified - PPO | Admitting: Physician Assistant

## 2018-04-19 VITALS — BP 162/66 | HR 75 | Resp 18 | Ht 63.0 in | Wt 132.0 lb

## 2018-04-19 DIAGNOSIS — I739 Peripheral vascular disease, unspecified: Secondary | ICD-10-CM | POA: Diagnosis not present

## 2018-04-19 DIAGNOSIS — Z9889 Other specified postprocedural states: Secondary | ICD-10-CM | POA: Diagnosis not present

## 2018-04-19 DIAGNOSIS — I779 Disorder of arteries and arterioles, unspecified: Secondary | ICD-10-CM

## 2018-04-19 DIAGNOSIS — I6523 Occlusion and stenosis of bilateral carotid arteries: Secondary | ICD-10-CM

## 2018-04-19 NOTE — Progress Notes (Signed)
History of Present Illness:  Patient is a 85 y.o. year old female who presents for evaluation of carotid stenosis.  The patient denies symptoms of TIA, amaurosis, or stroke.  She is s/p left CEA by Dr. Bridgett Larsson Patient has history of TIA with recepetive aphasia.  The patient has never had amaurosis fugax or monocular blindness.  The patient has never had facial drooping or hemiplegia.  The patient has never had expressive aphasia.      Past Medical History:  Diagnosis Date  . Anemia    takes Iron pill daily  . Arthritis   . Carotid artery occlusion   . Fibromyalgia   . Fibromyalgia   . Glaucoma    uses Eye Drops daily  . HOH (hard of hearing)   . Hyperlipidemia    takes Atorvastatin daily  . Hypertension    takes Benazepril daily  . Hypothyroidism    takes Synthroid daily  . Macular degeneration    wet and gets injections in both eyes  . Nocturia   . Peripheral vascular disease ([place])   . Rectal incontinence   . Stroke ([place])   . 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 APPENDECTOMY    . CAROTID ENDARTERECTOMY    . cataract surgery Bilateral   . COLONOSCOPY    . ENDARTERECTOMY Left 02/19/2014   Procedure: ENDARTERECTOMY CAROTID;  Surgeon: Conrad Humphrey, MD;  Location: [facility];  Service: Vascular;  Laterality: Left;  . ESOPHAGOGASTRODUODENOSCOPY    . TONSILLECTOMY    . TOTAL HIP ARTHROPLASTY Left 10/2013  . TUBAL LIGATION    . WRIST SURGERY     pins and screws     Social History Social History   Tobacco Use  . Smoking status: Former Research scientist (life sciences)  . Smokeless tobacco: Never Used  . Tobacco comment: quit smoking 23yrs ago  Substance Use Topics  . Alcohol use: No    Alcohol/week: 0.0 standard drinks  . Drug use: No    Family History Family History  Problem Relation Age of Onset  . Prostate cancer Father   . Cancer Father   . Lung disease Sister   . Cancer Brother      Allergies  Allergies  Allergen Reactions  . Aggrenox [Aspirin-Dipyridamole Er] Anaphylaxis    Severe headache  . Codeine Other (See Comments)    Feet burn and agitation  . Morphine And Related Other (See Comments)    Feet burn and agitation     Current Outpatient Medications  Medication Sig Dispense Refill  . Acetaminophen (TYLENOL PO) Take 1-2 tablets by mouth every 6 (six) hours as needed (pain/headache).    Marland Kitchen amLODipine (NORVASC) 10 MG tablet Take 1 tablet (10 mg total) by mouth daily. (Patient taking differently: Take 10 mg by mouth every morning. ) 90 tablet 1  . aspirin 325 MG EC tablet Take 325 mg by mouth daily.     Marland Kitchen atorvastatin (LIPITOR) 20 MG tablet TAKE 1 TABLET (20 MG TOTAL) BY MOUTH DAILY AT 6 PM. 90 tablet 0  . benazepril (LOTENSIN) 20 MG tablet Take 1 tablet (20 mg total) by mouth daily. 90 tablet 1  . Besifloxacin HCl (BESIVANCE) 0.6 % SUSP Place 1 drop into both eyes See admin instructions. Instill one drop into both eyes 4 times daily on the day of and the day after eye injections - once a month    .  bimatoprost (LUMIGAN) 0.01 % SOLN Place 1 drop into both eyes at bedtime.    . Calcium Carbonate-Vitamin D (CALCIUM 600/VITAMIN D) 600-400 MG-UNIT per tablet Take 1 tablet by mouth daily with lunch.     . iron polysaccharides (FERREX 150) 150 MG capsule TAKE 1 CAPSULE BY MOUTH TWICE A DAY BEFORE LUNCH AND SUPPER AS INSTRUCTED 180 capsule 0  . levothyroxine (SYNTHROID, LEVOTHROID) 75 MCG tablet TAKE 1 TABLET (75 MCG TOTAL) BY MOUTH DAILY BEFORE BREAKFAST. (Patient taking differently: Take 75 mcg by mouth at bedtime. ) 90 tablet 2  . metroNIDAZOLE (FLAGYL) 500 MG tablet Take 1 tablet (500 mg total) by mouth 2 (two) times daily. 14 tablet 0  . Multiple Vitamin (MULTIVITAMIN WITH MINERALS) TABS tablet Take 1 tablet by mouth daily.    . Omega-3 Fatty Acids (FISH OIL) 1200 MG CAPS Take 1,200 mg by mouth daily with supper.     . polyethylene glycol powder (GLYCOLAX/MIRALAX)  powder Mix 1 capful (17g) in 8 ounces of water and drink 1 to 2 times daily as needed for constipation. 225 g 1  . triamcinolone cream (KENALOG) 0.1 % Apply 1 application topically 2 (two) times daily as needed (scalp itching).     No current facility-administered medications for this visit.     ROS:   General:  No weight loss, Fever, chills  HEENT: No recent headaches, no nasal bleeding, no visual changes, no sore throat  Neurologic: No dizziness, blackouts, seizures. No recent symptoms of stroke or mini- stroke. No recent episodes of slurred speech, or temporary blindness.  Cardiac: No recent episodes of chest pain/pressure, no shortness of breath at rest.  No shortness of breath with exertion.  Denies history of atrial fibrillation or irregular heartbeat  Vascular: No history of rest pain in feet.  No history of claudication.  No history of non-healing ulcer, No history of DVT   Pulmonary: No home oxygen, no productive cough, no hemoptysis,  No asthma or wheezing  Musculoskeletal:  [x ] Arthritis, [ ]  Low back pain,  [x ] Joint pain  Hematologic:No history of hypercoagulable state.  No history of easy bleeding.  No history of anemia  Gastrointestinal: No hematochezia or melena,  No gastroesophageal reflux, no trouble swallowing  Urinary: [ ]  chronic Kidney disease, [ ]  on HD - [ ]  MWF or [ ]  TTHS, [ ]  Burning with urination, [ ]  Frequent urination, [ ]  Difficulty urinating;   Skin: No rashes  Psychological: No history of anxiety,  No history of depression   Physical Examination  Vitals:   04/19/18 1136  BP: (!) 162/66  Pulse: 75  Resp: 18  SpO2: 100%  Weight: 132 lb (59.9 kg)  Height: 5\' 3"  (1.6 m)    Body mass index is 23.38 kg/m.  General:  Alert and oriented, no acute distress HEENT: Normal, normocephalic Neck: No bruit or JVD Pulmonary: Clear to auscultation bilaterally Cardiac: Regular Rate and Rhythm without murmur Gastrointestinal: Soft, non-tender,  non-distended, no mass, no scars Skin: No rash Extremity Pulses:  2+ radial, brachial, femoral, dorsalis pedis, posterior tibial pulses bilaterally Musculoskeletal: No deformity or edema  Neurologic: Upper and lower extremity motor 5/5 and symmetric  DATA:  Right Carotid Findings: +----------+--------+--------+--------+------------+--------+           PSV cm/sEDV cm/sStenosisDescribe    Comments +----------+--------+--------+--------+------------+--------+ CCA Prox  85      10                                   +----------+--------+--------+--------+------------+--------+  CCA Mid   79      12              heterogenous         +----------+--------+--------+--------+------------+--------+ CCA Distal83      12                                   +----------+--------+--------+--------+------------+--------+ ICA Prox  95      12              heterogenous         +----------+--------+--------+--------+------------+--------+ ICA Mid   111     16                                   +----------+--------+--------+--------+------------+--------+ ICA Distal116     12                          tortuous +----------+--------+--------+--------+------------+--------+ ECA       337     21      >50%    heterogenous         +----------+--------+--------+--------+------------+--------+  +----------+--------+-------+----------------+-------------------+           PSV cm/sEDV cmsDescribe        Arm Pressure (mmHG) +----------+--------+-------+----------------+-------------------+ Subclavian141     2      Multiphasic, WNL                    +----------+--------+-------+----------------+-------------------+  +---------+--------+--+--------+-+---------+ VertebralPSV cm/s41EDV cm/s4Antegrade +---------+--------+--+--------+-+---------+    Left Carotid Findings: +----------+--------+--------+--------+------------+--------+            PSV cm/sEDV cm/sStenosisDescribe    Comments +----------+--------+--------+--------+------------+--------+ CCA Prox  103     12              heterogenous         +----------+--------+--------+--------+------------+--------+ CCA Mid   97      10              heterogenous         +----------+--------+--------+--------+------------+--------+ CCA Distal104     9                                    +----------+--------+--------+--------+------------+--------+ ICA Prox  124     19                                   +----------+--------+--------+--------+------------+--------+ ICA Mid   140     10                                   +----------+--------+--------+--------+------------+--------+ ICA Distal71      8                                    +----------+--------+--------+--------+------------+--------+ ECA       241     15              heterogenous         +----------+--------+--------+--------+------------+--------+  +----------+--------+--------+----------------+-------------------+ SubclavianPSV cm/sEDV cm/sDescribe  Arm Pressure (mmHG) +----------+--------+--------+----------------+-------------------+           188     4       Multiphasic, WNL                    +----------+--------+--------+----------------+-------------------+  +---------+--------+--+--------+-+---------+ VertebralPSV cm/s56EDV cm/s8Antegrade +---------+--------+--+--------+-+---------+    Summary: Right Carotid: Velocities in the right ICA are consistent with a 1-39% stenosis.                The ECA appears >50% stenosed.  Left Carotid: Velocities in the left ICA are consistent with a 1-39% stenosis,               status post carotid endarterectomy.  Vertebrals:  Bilateral vertebral arteries demonstrate antegrade flow. Subclavians: Normal flow hemodynamics were seen in bilateral subclavian              arteries.  ASSESSMENT:   Carotid stenosis S/P left CEA 2015 by Dr. Bridgett Larsson She is doing well without symptoms of stroke or TIA.  Both ICA's are patent with <39% stenosis and no restenosis of the left ICA.   PLAN: Carotid stenosis stable.  She will f/u in 2 years for repeat carotid duplex B.  If she develop symptoms of stroke or TIA she will call 911.    She is on a daily statin and 325 aspirin.   Roxy Horseman PA-C [facility] Office: [phone]  MD in clinic [place]

## 2018-05-10 ENCOUNTER — Encounter (INDEPENDENT_AMBULATORY_CARE_PROVIDER_SITE_OTHER): Payer: Federal, State, Local not specified - PPO | Admitting: Ophthalmology

## 2018-05-10 DIAGNOSIS — I10 Essential (primary) hypertension: Secondary | ICD-10-CM | POA: Diagnosis not present

## 2018-05-10 DIAGNOSIS — H353231 Exudative age-related macular degeneration, bilateral, with active choroidal neovascularization: Secondary | ICD-10-CM | POA: Diagnosis not present

## 2018-05-10 DIAGNOSIS — H43813 Vitreous degeneration, bilateral: Secondary | ICD-10-CM | POA: Diagnosis not present

## 2018-05-10 DIAGNOSIS — H35033 Hypertensive retinopathy, bilateral: Secondary | ICD-10-CM

## 2018-05-15 ENCOUNTER — Other Ambulatory Visit: Payer: Self-pay | Admitting: Family Medicine

## 2018-05-15 MED ORDER — AMLODIPINE BESYLATE 10 MG PO TABS: 10.0000 mg | ORAL_TABLET | Freq: Every day | ORAL | 0 refills | Status: DC

## 2018-05-15 NOTE — Telephone Encounter (Signed)
NA, phone hangs up after connected, refill sent to pharmacy

## 2018-06-06 ENCOUNTER — Encounter (INDEPENDENT_AMBULATORY_CARE_PROVIDER_SITE_OTHER): Payer: Federal, State, Local not specified - PPO | Admitting: Ophthalmology

## 2018-06-06 ENCOUNTER — Other Ambulatory Visit: Payer: Self-pay

## 2018-06-06 DIAGNOSIS — H43813 Vitreous degeneration, bilateral: Secondary | ICD-10-CM | POA: Diagnosis not present

## 2018-06-06 DIAGNOSIS — H35033 Hypertensive retinopathy, bilateral: Secondary | ICD-10-CM

## 2018-06-06 DIAGNOSIS — I10 Essential (primary) hypertension: Secondary | ICD-10-CM

## 2018-06-06 DIAGNOSIS — H353231 Exudative age-related macular degeneration, bilateral, with active choroidal neovascularization: Secondary | ICD-10-CM | POA: Diagnosis not present

## 2018-06-23 ENCOUNTER — Other Ambulatory Visit: Payer: Self-pay | Admitting: Family Medicine

## 2018-06-25 ENCOUNTER — Other Ambulatory Visit: Payer: Self-pay | Admitting: Family Medicine

## 2018-06-30 ENCOUNTER — Other Ambulatory Visit: Payer: Self-pay | Admitting: Physician Assistant

## 2018-07-01 ENCOUNTER — Other Ambulatory Visit: Payer: Self-pay

## 2018-07-01 ENCOUNTER — Encounter: Payer: Self-pay | Admitting: Family Medicine

## 2018-07-01 ENCOUNTER — Ambulatory Visit (INDEPENDENT_AMBULATORY_CARE_PROVIDER_SITE_OTHER): Payer: Federal, State, Local not specified - PPO | Admitting: Family Medicine

## 2018-07-01 ENCOUNTER — Ambulatory Visit (INDEPENDENT_AMBULATORY_CARE_PROVIDER_SITE_OTHER): Payer: Federal, State, Local not specified - PPO

## 2018-07-01 VITALS — BP 138/83 | HR 80 | Temp 97.7°F | Ht 63.0 in | Wt 121.0 lb

## 2018-07-01 DIAGNOSIS — R739 Hyperglycemia, unspecified: Secondary | ICD-10-CM | POA: Diagnosis not present

## 2018-07-01 DIAGNOSIS — R0602 Shortness of breath: Secondary | ICD-10-CM

## 2018-07-01 DIAGNOSIS — D721 Eosinophilia: Secondary | ICD-10-CM

## 2018-07-01 DIAGNOSIS — I700 Atherosclerosis of aorta: Secondary | ICD-10-CM | POA: Insufficient documentation

## 2018-07-01 DIAGNOSIS — D7219 Other eosinophilia: Secondary | ICD-10-CM

## 2018-07-01 DIAGNOSIS — I499 Cardiac arrhythmia, unspecified: Secondary | ICD-10-CM

## 2018-07-01 DIAGNOSIS — R748 Abnormal levels of other serum enzymes: Secondary | ICD-10-CM

## 2018-07-01 NOTE — Progress Notes (Addendum)
Subjective:  Patient ID: Jenna Ramos, female    DOB: [birth year], 85 y.o.   MRN: [id number]  Chief Complaint:  Tachycardia   HPI: Jenna Ramos is a 85 y.o. female presenting on 07/01/2018 for Tachycardia  Pt presents today with complaints of tachycardia yesterday. Pt states this was persistent most of the day. States her HR ranged from 100-140s. She denies chest pain, arm pain, abdominal pain, jaw pain, neck pain, diaphoresis, nausea, or vomiting. States she has had some slight weakness in her legs and light headedness. She did have slight exertional shortness of breath yesterday. States all of these symptoms have sine subsided. She denies any abnormal bruising or bleeding. No melena or hematochezia. No vaginal bleeding or hematuria. She is on levothyroxine and does not take brand replacement. States she has just started a new bottle of pills. States she has never had an issue with generic in the past.  She denies stimulants or over the counter cold medications. She does drink two cups of coffee per day and sweat tea.   Relevant past medical, surgical, family, and social history reviewed and updated as indicated.  Allergies and medications reviewed and updated.   Past Medical History:  Diagnosis Date  . Anemia    takes Iron pill daily  . Arthritis   . Carotid artery occlusion   . Fibromyalgia   . Fibromyalgia   . Glaucoma    uses Eye Drops daily  . HOH (hard of hearing)   . Hyperlipidemia    takes Atorvastatin daily  . Hypertension    takes Benazepril daily  . Hypothyroidism    takes Synthroid daily  . Macular degeneration    wet and gets injections in both eyes  . Nocturia   . Peripheral vascular disease ([place])   . Rectal incontinence   . Stroke ([place])   . 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 APPENDECTOMY    . CAROTID ENDARTERECTOMY    . cataract surgery Bilateral    . COLONOSCOPY    . ENDARTERECTOMY Left 02/19/2014   Procedure: ENDARTERECTOMY CAROTID;  Surgeon: Conrad Coleman, MD;  Location: [facility];  Service: Vascular;  Laterality: Left;  . ESOPHAGOGASTRODUODENOSCOPY    . TONSILLECTOMY    . TOTAL HIP ARTHROPLASTY Left 10/2013  . TUBAL LIGATION    . WRIST SURGERY     pins and screws    Social History   Socioeconomic History  . Marital status: Widowed    Spouse name: Not on file  . Number of children: Not on file  . Years of education: Not on file  . Highest education level: Not on file  Occupational History  . Not on file  Social Needs  . Financial resource strain: Not on file  . Food insecurity:    Worry: Not on file    Inability: Not on file  . Transportation needs:    Medical: Not on file    Non-medical: Not on file  Tobacco Use  . Smoking status: Former Research scientist (life sciences)  . Smokeless tobacco: Never Used  . Tobacco comment: quit smoking 27yr ago  Substance and Sexual Activity  . Alcohol use: No    Alcohol/week: 0.0 standard drinks  . Drug use: No  . Sexual activity: Yes    Birth control/protection: Post-menopausal  Lifestyle  . Physical activity:    Days per week: Not on file    Minutes  per session: Not on file  . Stress: Not on file  Relationships  . Social connections:    Talks on phone: Not on file    Gets together: Not on file    Attends religious service: Not on file    Active member of club or organization: Not on file    Attends meetings of clubs or organizations: Not on file    Relationship status: Not on file  . Intimate partner violence:    Fear of current or ex partner: Not on file    Emotionally abused: Not on file    Physically abused: Not on file    Forced sexual activity: Not on file  Other Topics Concern  . Not on file  Social History Narrative  . Not on file    Outpatient Encounter Medications as of 07/01/2018  Medication Sig  . Acetaminophen (TYLENOL PO) Take 1-2 tablets by mouth every 6 (six) hours as  needed (pain/headache).  Marland Kitchen amLODipine (NORVASC) 10 MG tablet Take 1 tablet (10 mg total) by mouth daily.  Marland Kitchen aspirin 325 MG EC tablet Take 325 mg by mouth daily.   Marland Kitchen atorvastatin (LIPITOR) 20 MG tablet Take 1 tablet (20 mg total) by mouth daily at 6 PM. (Please make 6 mos fu)  . benazepril (LOTENSIN) 20 MG tablet Take 1 tablet (20 mg total) by mouth daily.  Marland Kitchen Besifloxacin HCl (BESIVANCE) 0.6 % SUSP Place 1 drop into both eyes See admin instructions. Instill one drop into both eyes 4 times daily on the day of and the day after eye injections - once a month  . bimatoprost (LUMIGAN) 0.01 % SOLN Place 1 drop into both eyes at bedtime.  . Calcium Carbonate-Vitamin D (CALCIUM 600/VITAMIN D) 600-400 MG-UNIT per tablet Take 1 tablet by mouth daily with lunch.   . iron polysaccharides (FERREX 150) 150 MG capsule TAKE 1 CAPSULE BY MOUTH TWICE A DAY BEFORE LUNCH AND SUPPER AS INSTRUCTED  . levothyroxine (SYNTHROID) 75 MCG tablet TAKE 1 TABLET (75 MCG TOTAL) BY MOUTH DAILY BEFORE BREAKFAST.  Marland Kitchen metroNIDAZOLE (FLAGYL) 500 MG tablet Take 1 tablet (500 mg total) by mouth 2 (two) times daily.  . Multiple Vitamin (MULTIVITAMIN WITH MINERALS) TABS tablet Take 1 tablet by mouth daily.  . Omega-3 Fatty Acids (FISH OIL) 1200 MG CAPS Take 1,200 mg by mouth daily with supper.   . polyethylene glycol powder (GLYCOLAX/MIRALAX) powder Mix 1 capful (17g) in 8 ounces of water and drink 1 to 2 times daily as needed for constipation.  . triamcinolone cream (KENALOG) 0.1 % Apply 1 application topically 2 (two) times daily as needed (scalp itching).   No facility-administered encounter medications on file as of 07/01/2018.     Allergies  Allergen Reactions  . Aggrenox [Aspirin-Dipyridamole Er] Anaphylaxis    Severe headache  . Codeine Other (See Comments)    Feet burn and agitation  . Morphine And Related Other (See Comments)    Feet burn and agitation    Review of Systems  Constitutional: Negative for activity change,  appetite change, chills, diaphoresis, fatigue, fever and unexpected weight change.  Eyes: Negative for photophobia and visual disturbance.  Respiratory: Positive for shortness of breath (yesterday). Negative for cough, chest tightness and wheezing.   Cardiovascular: Positive for palpitations. Negative for chest pain and leg swelling.  Gastrointestinal: Negative for abdominal distention, abdominal pain, anal bleeding, blood in stool, constipation, diarrhea, nausea and vomiting.  Endocrine: Negative for cold intolerance, heat intolerance, polydipsia, polyphagia and polyuria.  Genitourinary:  Negative for decreased urine volume, difficulty urinating, hematuria and vaginal bleeding.  Musculoskeletal: Negative for arthralgias and myalgias.  Neurological: Positive for weakness (slight general weakness) and light-headedness. Negative for dizziness and headaches.  Psychiatric/Behavioral: Negative for confusion.        Objective:  BP 138/83   Pulse 80   Temp 97.7 F (36.5 C) (Oral)   Ht 5' 3"  (1.6 m)   Wt 121 lb (54.9 kg)   BMI 21.43 kg/m    Wt Readings from Last 3 Encounters:  07/01/18 121 lb (54.9 kg)  04/19/18 132 lb (59.9 kg)  03/18/18 132 lb (59.9 kg)    Physical Exam Vitals signs and nursing note reviewed.  Constitutional:      General: She is not in acute distress.    Appearance: Normal appearance. She is well-developed and well-groomed. She is not ill-appearing or toxic-appearing.  HENT:     Head: Normocephalic and atraumatic.     Right Ear: Decreased hearing (chronic) noted.     Left Ear: Decreased hearing (chronic) noted.     Mouth/Throat:     Mouth: Mucous membranes are moist.     Pharynx: Oropharynx is clear.  Eyes:     Extraocular Movements: Extraocular movements intact.     Conjunctiva/sclera: Conjunctivae normal.     Pupils: Pupils are equal, round, and reactive to light.  Neck:     Musculoskeletal: Normal range of motion and neck supple.     Vascular: No carotid  bruit.  Cardiovascular:     Rate and Rhythm: Normal rate. Rhythm irregularly irregular.     Pulses: Normal pulses.     Heart sounds: Normal heart sounds. No murmur. No friction rub. No gallop.   Pulmonary:     Effort: Pulmonary effort is normal. No respiratory distress.     Breath sounds: Rhonchi (scattered) present.  Abdominal:     General: Bowel sounds are normal. There is no distension.     Palpations: Abdomen is soft.     Tenderness: There is no abdominal tenderness. There is no right CVA tenderness or left CVA tenderness.  Musculoskeletal:     Right lower leg: No edema.     Left lower leg: No edema.  Lymphadenopathy:     Cervical: No cervical adenopathy.  Skin:    General: Skin is warm and dry.     Capillary Refill: Capillary refill takes less than 2 seconds.     Coloration: Skin is not pale.     Findings: No bruising.  Neurological:     General: No focal deficit present.     Mental Status: She is alert and oriented to person, place, and time.  Psychiatric:        Mood and Affect: Mood normal.        Behavior: Behavior normal. Behavior is cooperative.        Thought Content: Thought content normal.        Judgment: Judgment normal.     Results for orders placed or performed in visit on 03/18/18  WET PREP FOR [place], YEAST, CLUE  Result Value Ref Range   Trichomonas Exam Negative Negative   Yeast Exam Positive (A) Negative   Clue Cell Exam Positive (A) Negative     X-Ray: Chest: No acute findings. Preliminary x-ray reading by Monia Pouch, FNP-C, [facility].  EKG: Sinus rhythm without ectopy or ST abnormalities. No significant changes from 12/17/2017 EKG.  Pertinent labs & imaging results that were available during my care of the patient were  reviewed by me and considered in my medical decision making.  Assessment & Plan:  Jenna Ramos was seen today for tachycardia.  Diagnoses and all orders for this visit:  Irregular heart beat Noted irregularities upon auscultation of  heart. This was not captured on the EKG. Will order 24 Holter monitor. Report any new or worsening symptoms. Pt aware of signs and symptoms that warrant emergent evaluation and treatment. Referral to cardiology placed. Labs pending. Could consider changing to brand Synthroid if TSH is abnormal. Discussed possibly cutting down caffeine intake.  -     CMP14+EGFR -     CBC with Differential/Platelet -     TSH -     DG Chest 2 View; Future -     EKG 12-Lead -     Ambulatory referral to Cardiology -     24 hour holter monitor; Future -     24 hour holter monitor  Shortness of breath Chest xray unremarkable in office. Labs pending. Report any new or worsening symptoms.  -     CMP14+EGFR -     CBC with Differential/Platelet -     TSH -     DG Chest 2 View; Future -     EKG 12-Lead     Continue all other maintenance medications.  Follow up plan: Return in about 1 week (around 07/08/2018), or if symptoms worsen or fail to improve, for PCP for Irregular heart beat.  Educational handout given for palpitations.   The above assessment and management plan was discussed with the patient. The patient verbalized understanding of and has agreed to the management plan. Patient is aware to call the clinic if symptoms persist or worsen. Patient is aware when to return to the clinic for a follow-up visit. Patient educated on when it is appropriate to go to the emergency department.   Monia Pouch, FNP-C [facility] [phone]

## 2018-07-01 NOTE — Patient Instructions (Signed)
Palpitations  Palpitations are feelings that your heartbeat is not normal. Your heartbeat may feel like it is:   Uneven.   Faster than normal.   Fluttering.   Skipping a beat.  This is usually not a serious problem. In some cases, you may need tests to rule out any serious problems.  Follow these instructions at home:  Pay attention to any changes in your condition. Take these actions to help manage your symptoms:  Eating and drinking   Avoid:  ? Coffee, tea, soft drinks, and energy drinks.  ? Chocolate.  ? Alcohol.  ? Diet pills.  Lifestyle     Try to lower your stress. These things can help you relax:  ? Yoga.  ? Deep breathing and meditation.  ? Exercise.  ? Using words and images to create positive thoughts (guided imagery).  ? Using your mind to control things in your body (biofeedback).   Do not use drugs.   Get plenty of rest and sleep. Keep a regular bed time.  General instructions     Take over-the-counter and prescription medicines only as told by your doctor.   Do not use any products that contain nicotine or tobacco, such as cigarettes and e-cigarettes. If you need help quitting, ask your doctor.   Keep all follow-up visits as told by your doctor. This is important. You may need more tests if palpitations do not go away or get worse.  Contact a doctor if:   Your symptoms last more than 24 hours.   Your symptoms occur more often.  Get help right away if you:   Have chest pain.   Feel short of breath.   Have a very bad headache.   Feel dizzy.   Pass out (faint).  Summary   Palpitations are feelings that your heartbeat is uneven or faster than normal. It may feel like your heart is fluttering or skipping a beat.   Avoid food and drinks that may cause palpitations. These include caffeine, chocolate, and alcohol.   Try to lower your stress. Do not smoke or use drugs.   Get help right away if you faint or have chest pain, shortness of breath, a severe headache, or dizziness.  This  information is not intended to replace advice given to you by your health care provider. Make sure you discuss any questions you have with your health care provider.  Document Released: 11/30/2007 Document Revised: 04/04/2017 Document Reviewed: 04/04/2017  Elsevier Interactive Patient Education  2019 [employer]

## 2018-07-02 ENCOUNTER — Telehealth: Payer: Self-pay | Admitting: Family Medicine

## 2018-07-02 LAB — CBC WITH DIFFERENTIAL/PLATELET
Basophils Absolute: 0.1 10*3/uL (ref 0.0–0.2)
Basos: 1 %
EOS (ABSOLUTE): 0.5 10*3/uL — ABNORMAL HIGH (ref 0.0–0.4)
Eos: 5 %
Hematocrit: 35.6 % (ref 34.0–46.6)
Hemoglobin: 12 g/dL (ref 11.1–15.9)
Immature Grans (Abs): 0 10*3/uL (ref 0.0–0.1)
Immature Granulocytes: 0 %
Lymphocytes Absolute: 1.3 10*3/uL (ref 0.7–3.1)
Lymphs: 12 %
MCH: 29 pg (ref 26.6–33.0)
MCHC: 33.7 g/dL (ref 31.5–35.7)
MCV: 86 fL (ref 79–97)
Monocytes Absolute: 1.2 10*3/uL — ABNORMAL HIGH (ref 0.1–0.9)
Monocytes: 11 %
Neutrophils Absolute: 7.9 10*3/uL — ABNORMAL HIGH (ref 1.4–7.0)
Neutrophils: 71 %
Platelets: 336 10*3/uL (ref 150–450)
RBC: 4.14 x10E6/uL (ref 3.77–5.28)
RDW: 12.8 % (ref 11.7–15.4)
WBC: 10.9 10*3/uL — ABNORMAL HIGH (ref 3.4–10.8)

## 2018-07-02 LAB — CMP14+EGFR
ALT: 79 IU/L — ABNORMAL HIGH (ref 0–32)
AST: 30 IU/L (ref 0–40)
Albumin/Globulin Ratio: 1.8 (ref 1.2–2.2)
Albumin: 4.3 g/dL (ref 3.6–4.6)
Alkaline Phosphatase: 181 IU/L — ABNORMAL HIGH (ref 39–117)
BUN/Creatinine Ratio: 20 (ref 12–28)
BUN: 17 mg/dL (ref 8–27)
Bilirubin Total: 0.6 mg/dL (ref 0.0–1.2)
CO2: 22 mmol/L (ref 20–29)
Calcium: 9.7 mg/dL (ref 8.7–10.3)
Chloride: 103 mmol/L (ref 96–106)
Creatinine, Ser: 0.84 mg/dL (ref 0.57–1.00)
GFR calc Af Amer: 72 mL/min/{1.73_m2} (ref >59)
GFR calc non Af Amer: 62 mL/min/{1.73_m2} (ref >59)
Globulin, Total: 2.4 g/dL (ref 1.5–4.5)
Glucose: 193 mg/dL — ABNORMAL HIGH (ref 65–99)
Potassium: 4.5 mmol/L (ref 3.5–5.2)
Sodium: 142 mmol/L (ref 134–144)
Total Protein: 6.7 g/dL (ref 6.0–8.5)

## 2018-07-02 LAB — TSH: TSH: 1.68 u[IU]/mL (ref 0.450–4.500)

## 2018-07-02 NOTE — Addendum Note (Signed)
Addended by: Baruch Gouty on: 07/02/2018 08:11 AM   Modules accepted: Orders

## 2018-07-03 NOTE — Telephone Encounter (Signed)
Patient's phone does not allow a voice mail to be left.

## 2018-07-04 ENCOUNTER — Encounter (INDEPENDENT_AMBULATORY_CARE_PROVIDER_SITE_OTHER): Payer: Medicare Other | Admitting: Ophthalmology

## 2018-07-04 ENCOUNTER — Encounter: Payer: Self-pay | Admitting: Family Medicine

## 2018-07-04 ENCOUNTER — Telehealth: Payer: Self-pay | Admitting: Family Medicine

## 2018-07-04 DIAGNOSIS — I4891 Unspecified atrial fibrillation: Secondary | ICD-10-CM | POA: Insufficient documentation

## 2018-07-04 NOTE — Telephone Encounter (Signed)
Attempted to call pertaining to labs and Holter monitor results. No answer, unable to leave message.

## 2018-07-08 ENCOUNTER — Encounter: Payer: Self-pay | Admitting: Family Medicine

## 2018-07-08 ENCOUNTER — Ambulatory Visit (INDEPENDENT_AMBULATORY_CARE_PROVIDER_SITE_OTHER): Payer: Federal, State, Local not specified - PPO | Admitting: Family Medicine

## 2018-07-08 ENCOUNTER — Other Ambulatory Visit: Payer: Self-pay

## 2018-07-08 VITALS — BP 123/54 | HR 68 | Temp 98.2°F | Ht 63.0 in | Wt 130.4 lb

## 2018-07-08 DIAGNOSIS — Z7901 Long term (current) use of anticoagulants: Secondary | ICD-10-CM

## 2018-07-08 DIAGNOSIS — Z09 Encounter for follow-up examination after completed treatment for conditions other than malignant neoplasm: Secondary | ICD-10-CM

## 2018-07-08 DIAGNOSIS — I480 Paroxysmal atrial fibrillation: Secondary | ICD-10-CM | POA: Diagnosis not present

## 2018-07-08 NOTE — Progress Notes (Signed)
Subjective: CC: hospital discharge follow up PCP: [name], DO [id number]:[id number] N Slusher is a 85 y.o. female presenting to clinic today for:  1. Atrial fibrillation Patient here for 1 week follow-up of irregular heart rate but she is actually hospitalized at [facility] last week for 2 days.  She was noted to be in atrial fibrillation and was subsequently started on diltiazem 180 mg and Eliquis 2.5 mg.  She has a follow-up with outpatient cardiology in 2 weeks.  Additionally, she notes that she is doing well from a heart rate and rhythm standpoint.  Denies any heart palpitations, fluttering.  No bleeding episodes including melena, hematochezia, vaginal bleeding or hematuria.  She is overall feeling well.  She has no complaints except that the [employer] is not open yet locally and that she is worried it will not open.  ROS: Per HPI  Allergies  Allergen Reactions  . Aggrenox [Aspirin-Dipyridamole Er] Anaphylaxis    Severe headache  . Codeine Other (See Comments)    Feet burn and agitation  . Morphine And Related Other (See Comments)    Feet burn and agitation   Past Medical History:  Diagnosis Date  . Anemia    takes Iron pill daily  . Arthritis   . Carotid artery occlusion   . Fibromyalgia   . Fibromyalgia   . Glaucoma    uses Eye Drops daily  . HOH (hard of hearing)   . Hyperlipidemia    takes Atorvastatin daily  . Hypertension    takes Benazepril daily  . Hypothyroidism    takes Synthroid daily  . Macular degeneration    wet and gets injections in both eyes  . Nocturia   . Peripheral vascular disease ([place])   . Rectal incontinence   . Stroke ([place])   . 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 10/28/2013    Current Outpatient Medications:  .  Acetaminophen (TYLENOL PO), Take 1-2 tablets by mouth every 6 (six) hours as needed (pain/headache)., Disp: , Rfl:  .  amLODipine (NORVASC) 10 MG tablet, Take 1 tablet (10 mg total) by mouth  daily., Disp: 90 tablet, Rfl: 0 .  aspirin 325 MG EC tablet, Take 325 mg by mouth daily. , Disp: , Rfl:  .  atorvastatin (LIPITOR) 20 MG tablet, Take 1 tablet (20 mg total) by mouth daily at 6 PM. (Please make 6 mos fu), Disp: 90 tablet, Rfl: 0 .  benazepril (LOTENSIN) 20 MG tablet, Take 1 tablet (20 mg total) by mouth daily., Disp: 90 tablet, Rfl: 1 .  Besifloxacin HCl (BESIVANCE) 0.6 % SUSP, Place 1 drop into both eyes See admin instructions. Instill one drop into both eyes 4 times daily on the day of and the day after eye injections - once a month, Disp: , Rfl:  .  bimatoprost (LUMIGAN) 0.01 % SOLN, Place 1 drop into both eyes at bedtime., Disp: , Rfl:  .  Calcium Carbonate-Vitamin D (CALCIUM 600/VITAMIN D) 600-400 MG-UNIT per tablet, Take 1 tablet by mouth daily with lunch. , Disp: , Rfl:  .  diltiazem (TIAZAC) 180 MG 24 hr capsule, Take 180 mg by mouth daily., Disp: , Rfl:  .  ELIQUIS 2.5 MG TABS tablet, Take 2.5 mg by mouth 2 (two) times a day., Disp: , Rfl:  .  iron polysaccharides (FERREX 150) 150 MG capsule, TAKE 1 CAPSULE BY MOUTH TWICE A DAY BEFORE LUNCH AND SUPPER AS INSTRUCTED, Disp: 180 capsule, Rfl: 0 .  levothyroxine (SYNTHROID) 75  MCG tablet, TAKE 1 TABLET (75 MCG TOTAL) BY MOUTH DAILY BEFORE BREAKFAST., Disp: 90 tablet, Rfl: 1 .  Multiple Vitamin (MULTIVITAMIN WITH MINERALS) TABS tablet, Take 1 tablet by mouth daily., Disp: , Rfl:  .  Omega-3 Fatty Acids (FISH OIL) 1200 MG CAPS, Take 1,200 mg by mouth daily with supper. , Disp: , Rfl:  .  polyethylene glycol powder (GLYCOLAX/MIRALAX) powder, Mix 1 capful (17g) in 8 ounces of water and drink 1 to 2 times daily as needed for constipation., Disp: 225 g, Rfl: 1 .  triamcinolone cream (KENALOG) 0.1 %, Apply 1 application topically 2 (two) times daily as needed (scalp itching)., Disp: , Rfl:  Social History   Socioeconomic History  . Marital status: Widowed    Spouse name: Not on file  . Number of children: Not on file  . Years of  education: Not on file  . Highest education level: Not on file  Occupational History  . Not on file  Social Needs  . Financial resource strain: Not on file  . Food insecurity:    Worry: Not on file    Inability: Not on file  . Transportation needs:    Medical: Not on file    Non-medical: Not on file  Tobacco Use  . Smoking status: Former Research scientist (life sciences)  . Smokeless tobacco: Never Used  . Tobacco comment: quit smoking 35yrs ago  Substance and Sexual Activity  . Alcohol use: No    Alcohol/week: 0.0 standard drinks  . Drug use: No  . Sexual activity: Yes    Birth control/protection: Post-menopausal  Lifestyle  . Physical activity:    Days per week: Not on file    Minutes per session: Not on file  . Stress: Not on file  Relationships  . Social connections:    Talks on phone: Not on file    Gets together: Not on file    Attends religious service: Not on file    Active member of club or organization: Not on file    Attends meetings of clubs or organizations: Not on file    Relationship status: Not on file  . Intimate partner violence:    Fear of current or ex partner: Not on file    Emotionally abused: Not on file    Physically abused: Not on file    Forced sexual activity: Not on file  Other Topics Concern  . Not on file  Social History Narrative  . Not on file   Family History  Problem Relation Age of Onset  . Prostate cancer Father   . Cancer Father   . Lung disease Sister   . Cancer Brother     Objective: Office vital signs reviewed. BP (!) 123/54   Pulse 68   Temp 98.2 F (36.8 C) (Oral)   Ht 5\' 3"  (1.6 m)   Wt 130 lb 6.4 oz (59.1 kg)   BMI 23.10 kg/m   Physical Examination:  General: Awake, alert, well nourished, well appearing female. No acute distress HEENT: Normal, sclera white. MMM Cardio: regular rate and rhythm, S1S2 heard, 2/6 SEM appreciated at the RSB Pulm: clear to auscultation bilaterally, no wheezes, rhonchi or rales; normal work of breathing on  room air Extremities: warm, well perfused, No edema, cyanosis or clubbing; +2 pulses bilaterally  Assessment/ Plan: 85 y.o. female   1. Paroxysmal atrial fibrillation (HCC) She seemed to be in sinus rhythm today.  Continue diltiazem at prescribed dose.  Continue Eliquis.  We discussed risk for GI  bleed with concomitant use of high-dose aspirin.  No history of overt CVA or MI.  May be able to either pursue low-dose aspirin versus only use Eliquis.  Will defer this to her cardiology appointment in 2 weeks.  2. Anticoagulated No red flag signs or symptoms.  Continue Eliquis  3. Hospital discharge follow-up I have reached out to [facility] for patient's discharge summary and information.  I have encouraged the patient to have her cardiologist send me records for each visit.  She will follow-up with me in 3 months, sooner if needed.  Total time spent with patient 26 minutes.  Greater than 50% of encounter spent in coordination of care/counseling.  No orders of the defined types were placed in this encounter.  No orders of the defined types were placed in this encounter.    [name], DO [place] [phone]

## 2018-07-09 ENCOUNTER — Encounter (INDEPENDENT_AMBULATORY_CARE_PROVIDER_SITE_OTHER): Payer: Medicare Other | Admitting: Ophthalmology

## 2018-07-09 DIAGNOSIS — H35033 Hypertensive retinopathy, bilateral: Secondary | ICD-10-CM

## 2018-07-09 DIAGNOSIS — I10 Essential (primary) hypertension: Secondary | ICD-10-CM

## 2018-07-09 DIAGNOSIS — H43813 Vitreous degeneration, bilateral: Secondary | ICD-10-CM

## 2018-07-09 DIAGNOSIS — H353231 Exudative age-related macular degeneration, bilateral, with active choroidal neovascularization: Secondary | ICD-10-CM

## 2018-07-17 ENCOUNTER — Ambulatory Visit (INDEPENDENT_AMBULATORY_CARE_PROVIDER_SITE_OTHER): Payer: Federal, State, Local not specified - PPO

## 2018-07-17 ENCOUNTER — Other Ambulatory Visit: Payer: Self-pay

## 2018-07-17 DIAGNOSIS — I10 Essential (primary) hypertension: Secondary | ICD-10-CM

## 2018-07-17 DIAGNOSIS — M797 Fibromyalgia: Secondary | ICD-10-CM | POA: Diagnosis not present

## 2018-07-17 DIAGNOSIS — H409 Unspecified glaucoma: Secondary | ICD-10-CM

## 2018-07-17 DIAGNOSIS — I4891 Unspecified atrial fibrillation: Secondary | ICD-10-CM | POA: Diagnosis not present

## 2018-07-17 DIAGNOSIS — Z8673 Personal history of transient ischemic attack (TIA), and cerebral infarction without residual deficits: Secondary | ICD-10-CM

## 2018-07-17 DIAGNOSIS — D649 Anemia, unspecified: Secondary | ICD-10-CM

## 2018-07-17 DIAGNOSIS — Z8781 Personal history of (healed) traumatic fracture: Secondary | ICD-10-CM

## 2018-07-17 DIAGNOSIS — Z7901 Long term (current) use of anticoagulants: Secondary | ICD-10-CM

## 2018-07-17 DIAGNOSIS — Z7982 Long term (current) use of aspirin: Secondary | ICD-10-CM

## 2018-07-17 DIAGNOSIS — Z9181 History of falling: Secondary | ICD-10-CM

## 2018-07-17 DIAGNOSIS — E039 Hypothyroidism, unspecified: Secondary | ICD-10-CM | POA: Diagnosis not present

## 2018-07-17 DIAGNOSIS — Z87891 Personal history of nicotine dependence: Secondary | ICD-10-CM

## 2018-08-05 ENCOUNTER — Other Ambulatory Visit: Payer: Self-pay

## 2018-08-05 ENCOUNTER — Encounter (INDEPENDENT_AMBULATORY_CARE_PROVIDER_SITE_OTHER): Payer: Federal, State, Local not specified - PPO | Admitting: Ophthalmology

## 2018-08-05 ENCOUNTER — Other Ambulatory Visit: Payer: Self-pay | Admitting: Family Medicine

## 2018-08-05 DIAGNOSIS — H353231 Exudative age-related macular degeneration, bilateral, with active choroidal neovascularization: Secondary | ICD-10-CM | POA: Diagnosis not present

## 2018-08-05 DIAGNOSIS — I10 Essential (primary) hypertension: Secondary | ICD-10-CM

## 2018-08-05 DIAGNOSIS — H35033 Hypertensive retinopathy, bilateral: Secondary | ICD-10-CM

## 2018-08-05 DIAGNOSIS — H43813 Vitreous degeneration, bilateral: Secondary | ICD-10-CM | POA: Diagnosis not present

## 2018-08-10 ENCOUNTER — Other Ambulatory Visit: Payer: Self-pay | Admitting: Family Medicine

## 2018-08-15 ENCOUNTER — Other Ambulatory Visit: Payer: Self-pay | Admitting: Family Medicine

## 2018-09-02 ENCOUNTER — Other Ambulatory Visit: Payer: Self-pay

## 2018-09-02 ENCOUNTER — Encounter (INDEPENDENT_AMBULATORY_CARE_PROVIDER_SITE_OTHER): Payer: Federal, State, Local not specified - PPO | Admitting: Ophthalmology

## 2018-09-02 DIAGNOSIS — I10 Essential (primary) hypertension: Secondary | ICD-10-CM

## 2018-09-02 DIAGNOSIS — H43813 Vitreous degeneration, bilateral: Secondary | ICD-10-CM

## 2018-09-02 DIAGNOSIS — H353231 Exudative age-related macular degeneration, bilateral, with active choroidal neovascularization: Secondary | ICD-10-CM | POA: Diagnosis not present

## 2018-09-02 DIAGNOSIS — H35033 Hypertensive retinopathy, bilateral: Secondary | ICD-10-CM | POA: Diagnosis not present

## 2018-09-17 ENCOUNTER — Other Ambulatory Visit: Payer: Self-pay | Admitting: Family Medicine

## 2018-10-08 ENCOUNTER — Ambulatory Visit: Payer: Medicare Other | Admitting: Family Medicine

## 2018-10-08 ENCOUNTER — Encounter (INDEPENDENT_AMBULATORY_CARE_PROVIDER_SITE_OTHER): Payer: Federal, State, Local not specified - PPO | Admitting: Ophthalmology

## 2018-10-08 ENCOUNTER — Other Ambulatory Visit: Payer: Self-pay

## 2018-10-08 DIAGNOSIS — H353231 Exudative age-related macular degeneration, bilateral, with active choroidal neovascularization: Secondary | ICD-10-CM | POA: Diagnosis not present

## 2018-10-08 DIAGNOSIS — H35033 Hypertensive retinopathy, bilateral: Secondary | ICD-10-CM | POA: Diagnosis not present

## 2018-10-08 DIAGNOSIS — I10 Essential (primary) hypertension: Secondary | ICD-10-CM

## 2018-10-08 DIAGNOSIS — H43813 Vitreous degeneration, bilateral: Secondary | ICD-10-CM | POA: Diagnosis not present

## 2018-10-18 ENCOUNTER — Ambulatory Visit (INDEPENDENT_AMBULATORY_CARE_PROVIDER_SITE_OTHER): Payer: Federal, State, Local not specified - PPO | Admitting: Family Medicine

## 2018-10-18 ENCOUNTER — Encounter: Payer: Self-pay | Admitting: Family Medicine

## 2018-10-18 VITALS — BP 123/62 | HR 53

## 2018-10-18 DIAGNOSIS — M797 Fibromyalgia: Secondary | ICD-10-CM

## 2018-10-18 DIAGNOSIS — I10 Essential (primary) hypertension: Secondary | ICD-10-CM | POA: Diagnosis not present

## 2018-10-18 DIAGNOSIS — I480 Paroxysmal atrial fibrillation: Secondary | ICD-10-CM | POA: Diagnosis not present

## 2018-10-18 MED ORDER — ATORVASTATIN CALCIUM 20 MG PO TABS: 20.0000 mg | ORAL_TABLET | Freq: Every day | ORAL | 3 refills | Status: DC

## 2018-10-18 MED ORDER — BENAZEPRIL HCL 20 MG PO TABS: 20.0000 mg | ORAL_TABLET | Freq: Every day | ORAL | 3 refills | Status: DC

## 2018-10-18 MED ORDER — AMLODIPINE BESYLATE 10 MG PO TABS: 10.0000 mg | ORAL_TABLET | Freq: Every day | ORAL | 3 refills | Status: DC

## 2018-10-18 MED ORDER — GABAPENTIN 100 MG PO CAPS: ORAL_CAPSULE | ORAL | 0 refills | Status: DC

## 2018-10-18 NOTE — Progress Notes (Signed)
Telephone visit  Subjective: CC: f.u afib PCP: [name], DO [id number] N Olesky is a 85 y.o. female calls for telephone consult today. Patient provides verbal consent for consult held via phone.  Location of patient: home Location of provider: Working remotely from home Others present for call: none  1. Fibromyalgia  Patient reports generalized aches and pains that have been worsening over the last 1 month.  She used to take Aspirin but now cannot due to anticoagulation for Afib.  She has never been treated with Cymbalta or gabapentin in the past.  She was diagnosed in her 67s and this is when it was most severe.  She is tried Tylenol but did not find it especially helpful and in fact felt that her blood pressure was little transient with Tylenol use.  2.  Atrial fibrillation/hypertension Patient reports good control of blood pressure with exception of a transient low into the systolics of 32D during Tylenol use.  She has been compliant with Norvasc, diltiazem and Eliquis.  Denies any bleeding episodes.  No chest pain or shortness of breath.  She continues to be active at the [employer] every weekend.   ROS: Per HPI  Allergies  Allergen Reactions  . Aggrenox [Aspirin-Dipyridamole Er] Anaphylaxis    Severe headache  . Codeine Other (See Comments)    Feet burn and agitation  . Morphine And Related Other (See Comments)    Feet burn and agitation   Past Medical History:  Diagnosis Date  . Anemia    takes Iron pill daily  . Arthritis   . Carotid artery occlusion   . Fibromyalgia   . Fibromyalgia   . Glaucoma    uses Eye Drops daily  . HOH (hard of hearing)   . Hyperlipidemia    takes Atorvastatin daily  . Hypertension    takes Benazepril daily  . Hypothyroidism    takes Synthroid daily  . Macular degeneration    wet and gets injections in both eyes  . Nocturia   . Peripheral vascular disease ([place])   . Rectal incontinence   . Stroke ([place])   .  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 10/28/2013    Current Outpatient Medications:  .  Acetaminophen (TYLENOL PO), Take 1-2 tablets by mouth every 6 (six) hours as needed (pain/headache)., Disp: , Rfl:  .  amLODipine (NORVASC) 10 MG tablet, TAKE 1 TABLET BY MOUTH EVERY DAY, Disp: 90 tablet, Rfl: 0 .  aspirin 325 MG EC tablet, Take 325 mg by mouth daily. , Disp: , Rfl:  .  atorvastatin (LIPITOR) 20 MG tablet, TAKE 1 TABLET (20 MG TOTAL) BY MOUTH DAILY AT 6 PM. (PLEASE MAKE 6 MOS FU), Disp: 90 tablet, Rfl: 0 .  benazepril (LOTENSIN) 20 MG tablet, TAKE 1 TABLET BY MOUTH EVERY DAY, Disp: 90 tablet, Rfl: 0 .  Besifloxacin HCl (BESIVANCE) 0.6 % SUSP, Place 1 drop into both eyes See admin instructions. Instill one drop into both eyes 4 times daily on the day of and the day after eye injections - once a month, Disp: , Rfl:  .  bimatoprost (LUMIGAN) 0.01 % SOLN, Place 1 drop into both eyes at bedtime., Disp: , Rfl:  .  Calcium Carbonate-Vitamin D (CALCIUM 600/VITAMIN D) 600-400 MG-UNIT per tablet, Take 1 tablet by mouth daily with lunch. , Disp: , Rfl:  .  diltiazem (TIAZAC) 180 MG 24 hr capsule, Take 180 mg by mouth daily., Disp: , Rfl:  .  ELIQUIS 2.5  MG TABS tablet, Take 2.5 mg by mouth 2 (two) times a day., Disp: , Rfl:  .  iron polysaccharides (FERREX 150) 150 MG capsule, TAKE 1 CAPSULE BY MOUTH TWICE A DAY BEFORE LUNCH AND SUPPER AS INSTRUCTED, Disp: 180 capsule, Rfl: 0 .  levothyroxine (SYNTHROID) 75 MCG tablet, TAKE 1 TABLET (75 MCG TOTAL) BY MOUTH DAILY BEFORE BREAKFAST., Disp: 90 tablet, Rfl: 1 .  Multiple Vitamin (MULTIVITAMIN WITH MINERALS) TABS tablet, Take 1 tablet by mouth daily., Disp: , Rfl:  .  Omega-3 Fatty Acids (FISH OIL) 1200 MG CAPS, Take 1,200 mg by mouth daily with supper. , Disp: , Rfl:  .  polyethylene glycol powder (GLYCOLAX/MIRALAX) powder, Mix 1 capful (17g) in 8 ounces of water and drink 1 to 2 times daily as needed for constipation., Disp: 225 g, Rfl: 1 .   triamcinolone cream (KENALOG) 0.1 %, Apply 1 application topically 2 (two) times daily as needed (scalp itching)., Disp: , Rfl:   Blood pressure 123/62, pulse (!) 53. Gen: does not sound distressed  Assessment/ Plan: 85 y.o. female   1. Fibromyalgia muscle pain Trial of gabapentin.  Gradual taper advised given age.  Follow up in 1 month.  If ongoing symptoms, could consider adding Cymbalta. - gabapentin (NEURONTIN) 100 MG capsule; Take 1 cap at bedtime x1 week. Then increase to 2 caps x1 week. Then increase 3 capsules qhs.  Dispense: 90 capsule; Refill: 0  2. Paroxysmal atrial fibrillation (HCC) Rate controlled.   3. Essential hypertension Controlled.  Meds ordered this encounter  Medications  . gabapentin (NEURONTIN) 100 MG capsule    Sig: Take 1 cap at bedtime x1 week. Then increase to 2 caps x1 week. Then increase 3 capsules qhs.    Dispense:  90 capsule    Refill:  0  . amLODipine (NORVASC) 10 MG tablet    Sig: Take 1 tablet (10 mg total) by mouth daily.    Dispense:  90 tablet    Refill:  3  . benazepril (LOTENSIN) 20 MG tablet    Sig: Take 1 tablet (20 mg total) by mouth daily.    Dispense:  90 tablet    Refill:  3  . atorvastatin (LIPITOR) 20 MG tablet    Sig: Take 1 tablet (20 mg total) by mouth daily at 6 PM.    Dispense:  90 tablet    Refill:  3    Additionally, she would like to trial off of the iron supplementation.  We will plan to recheck a CBC at next visit in 1 month.  This is been arranged patient aware of date and time.  Start time: 1:27pm End time: 1:47pm  Total time spent on patient care (including telephone call/ virtual visit): 25 minutes  [place], [place] [phone]

## 2018-10-18 NOTE — Patient Instructions (Signed)
Myofascial Pain Syndrome and Fibromyalgia Myofascial pain syndrome and fibromyalgia are both pain disorders. This pain may be felt mainly in your muscles.  Myofascial pain syndrome: ? Always has tender points in the muscle that will cause pain when pressed (trigger points). The pain may come and go. ? Usually affects your neck, upper back, and shoulder areas. The pain often radiates into your arms and hands.  Fibromyalgia: ? Has muscle pains and tenderness that come and go. ? Is often associated with fatigue and sleep problems. ? Has trigger points. ? Tends to be long-lasting (chronic), but is not life-threatening. Fibromyalgia and myofascial pain syndrome are not the same. However, they often occur together. If you have both conditions, each can make the other worse. Both are common and can cause enough pain and fatigue to make day-to-day activities difficult. Both can be hard to diagnose because their symptoms are common in many other conditions. What are the causes? The exact causes of these conditions are not known. What increases the risk? You are more likely to develop this condition if:  You have a family history of the condition.  You have certain triggers, such as: ? Spine disorders. ? An injury (trauma) or other physical stressors. ? Being under a lot of stress. ? Medical conditions such as osteoarthritis, rheumatoid arthritis, or lupus. What are the signs or symptoms? Fibromyalgia The main symptom of fibromyalgia is widespread pain and tenderness in your muscles. Pain is sometimes described as stabbing, shooting, or burning. You may also have:  Tingling or numbness.  Sleep problems and fatigue.  Problems with attention and concentration (fibro fog). Other symptoms may include:  Bowel and bladder problems.  Headaches.  Visual problems.  Problems with odors and noises.  Depression or mood changes.  Painful menstrual periods (dysmenorrhea).  Dry skin or  eyes. These symptoms can vary over time. Myofascial pain syndrome Symptoms of myofascial pain syndrome include:  Tight, ropy bands of muscle.  Uncomfortable sensations in muscle areas. These may include aching, cramping, burning, numbness, tingling, and weakness.  Difficulty moving certain parts of the body freely (poor range of motion). How is this diagnosed? This condition may be diagnosed by your symptoms and medical history. You will also have a physical exam. In general:  Fibromyalgia is diagnosed if you have pain, fatigue, and other symptoms for more than 3 months, and symptoms cannot be explained by another condition.  Myofascial pain syndrome is diagnosed if you have trigger points in your muscles, and those trigger points are tender and cause pain elsewhere in your body (referred pain). How is this treated? Treatment for these conditions depends on the type that you have.  For fibromyalgia: ? Pain medicines, such as NSAIDs. ? Medicines for treating depression. ? Medicines for treating seizures. ? Medicines that relax the muscles.  For myofascial pain: ? Pain medicines, such as NSAIDs. ? Cooling and stretching of muscles. ? Trigger point injections. ? Sound wave (ultrasound) treatments to stimulate muscles. Treating these conditions often requires a team of health care providers. These may include:  Your primary care provider.  Physical therapist.  Complementary health care providers, such as massage therapists or acupuncturists.  Psychiatrist for cognitive behavioral therapy. Follow these instructions at home: Medicines  Take over-the-counter and prescription medicines only as told by your health care provider.  Do not drive or use heavy machinery while taking prescription pain medicine.  If you are taking prescription pain medicine, take actions to prevent or treat constipation. Your health care   provider may recommend that you: ? Drink enough fluid to keep  your urine pale yellow. ? Eat foods that are high in fiber, such as fresh fruits and vegetables, whole grains, and beans. ? Limit foods that are high in fat and processed sugars, such as fried or sweet foods. ? Take an over-the-counter or prescription medicine for constipation. Lifestyle   Exercise as directed by your health care provider or physical therapist.  Practice relaxation techniques to control your stress. You may want to try: ? Biofeedback. ? Visual imagery. ? Hypnosis. ? Muscle relaxation. ? Yoga. ? Meditation.  Maintain a healthy lifestyle. This includes eating a healthy diet and getting enough sleep.  Do not use any products that contain nicotine or tobacco, such as cigarettes and e-cigarettes. If you need help quitting, ask your health care provider. General instructions  Talk to your health care provider about complementary treatments, such as acupuncture or massage.  Consider joining a support group with others who are diagnosed with this condition.  Do not do activities that stress or strain your muscles. This includes repetitive motions and heavy lifting.  Keep all follow-up visits as told by your health care provider. This is important. Where to find more information  National Fibromyalgia Association: www.fmaware.org  Arthritis Foundation: www.arthritis.org  American Chronic Pain Association: www.theacpa.org Contact a health care provider if:  You have new symptoms.  Your symptoms get worse or your pain is severe.  You have side effects from your medicines.  You have trouble sleeping.  Your condition is causing depression or anxiety. Summary  Myofascial pain syndrome and fibromyalgia are pain disorders.  Myofascial pain syndrome has tender points in the muscle that will cause pain when pressed (trigger points). Fibromyalgia also has muscle pains and tenderness that come and go, but this condition is often associated with fatigue and sleep  disturbances.  Fibromyalgia and myofascial pain syndrome are not the same but often occur together, causing pain and fatigue that make day-to-day activities difficult.  Treatment for fibromyalgia includes taking medicines to relax the muscles and medicines for pain, depression, or seizures. Treatment for myofascial pain syndrome includes taking medicines for pain, cooling and stretching of muscles, and injecting medicines into trigger points.  Follow your health care provider's instructions for taking medicines and maintaining a healthy lifestyle. This information is not intended to replace advice given to you by your health care provider. Make sure you discuss any questions you have with your health care provider. Document Released: 02/20/2005 Document Revised: 06/14/2018 Document Reviewed: 03/07/2017 Elsevier Patient Education  2020 [employer]  

## 2018-10-25 ENCOUNTER — Telehealth: Payer: Self-pay | Admitting: Family Medicine

## 2018-10-25 NOTE — Telephone Encounter (Signed)
Patient aware.

## 2018-10-25 NOTE — Telephone Encounter (Signed)
Patient has been taking gabapentin 100mg   1 capsule at bedtime for 1 week.  Informed patient she can increase to 2 capsules at bedtime for a week and then 3 capsules at bedtime.  Per instructions on prescription. Patient states that medication seems to be helping

## 2018-10-25 NOTE — Telephone Encounter (Signed)
Yes, if the 100mg  is working she does not have to increase but if she needs more she can increase to 200mg  at bedtime and so on as per the rx.

## 2018-11-04 ENCOUNTER — Other Ambulatory Visit: Payer: Self-pay

## 2018-11-04 ENCOUNTER — Encounter (INDEPENDENT_AMBULATORY_CARE_PROVIDER_SITE_OTHER): Payer: Federal, State, Local not specified - PPO | Admitting: Ophthalmology

## 2018-11-04 DIAGNOSIS — I10 Essential (primary) hypertension: Secondary | ICD-10-CM | POA: Diagnosis not present

## 2018-11-04 DIAGNOSIS — H35033 Hypertensive retinopathy, bilateral: Secondary | ICD-10-CM

## 2018-11-04 DIAGNOSIS — H43813 Vitreous degeneration, bilateral: Secondary | ICD-10-CM | POA: Diagnosis not present

## 2018-11-04 DIAGNOSIS — H353231 Exudative age-related macular degeneration, bilateral, with active choroidal neovascularization: Secondary | ICD-10-CM

## 2018-11-07 ENCOUNTER — Telehealth: Payer: Self-pay | Admitting: Family Medicine

## 2018-11-07 NOTE — Telephone Encounter (Signed)
Patient states that she does not feel like the gabapentin is working. She states that she is in a lot of pain. She wants to know if she can just stop the gabapentin?

## 2018-11-08 ENCOUNTER — Telehealth: Payer: Self-pay | Admitting: Family Medicine

## 2018-11-08 NOTE — Telephone Encounter (Signed)
Patient aware and verbalizes understanding. 

## 2018-11-08 NOTE — Telephone Encounter (Signed)
I recommend reducing by 1 capsule every 3 days. Then take 1 capsule every other day x3 days then ok to stop.

## 2018-11-08 NOTE — Telephone Encounter (Signed)
[id number]

## 2018-11-08 NOTE — Telephone Encounter (Signed)
Refer top previous phone note.

## 2018-11-18 ENCOUNTER — Other Ambulatory Visit: Payer: Self-pay

## 2018-11-19 ENCOUNTER — Encounter: Payer: Self-pay | Admitting: Family Medicine

## 2018-11-19 ENCOUNTER — Ambulatory Visit: Payer: Federal, State, Local not specified - PPO | Admitting: Family Medicine

## 2018-11-19 VITALS — BP 142/57 | HR 61 | Temp 98.4°F | Resp 20 | Ht 63.0 in | Wt 126.6 lb

## 2018-11-19 DIAGNOSIS — E039 Hypothyroidism, unspecified: Secondary | ICD-10-CM | POA: Diagnosis not present

## 2018-11-19 DIAGNOSIS — R7303 Prediabetes: Secondary | ICD-10-CM

## 2018-11-19 DIAGNOSIS — D72829 Elevated white blood cell count, unspecified: Secondary | ICD-10-CM

## 2018-11-19 DIAGNOSIS — I480 Paroxysmal atrial fibrillation: Secondary | ICD-10-CM

## 2018-11-19 DIAGNOSIS — R748 Abnormal levels of other serum enzymes: Secondary | ICD-10-CM | POA: Diagnosis not present

## 2018-11-19 DIAGNOSIS — Z23 Encounter for immunization: Secondary | ICD-10-CM

## 2018-11-19 DIAGNOSIS — M797 Fibromyalgia: Secondary | ICD-10-CM

## 2018-11-19 LAB — BAYER DCA HB A1C WAIVED: HB A1C (BAYER DCA - WAIVED): 6.1 % (ref <7.0)

## 2018-11-19 NOTE — Patient Instructions (Signed)

## 2018-11-19 NOTE — Progress Notes (Signed)
Subjective: CC: thyroid PCP: [name], DO UG:[id number] N Barresi is a 85 y.o. female presenting to clinic today for:  1. Hypothyroidism Patient reports compliance with Synthroid 75 mcg daily.  Denies any change in bowel habits, weight or energy.  No heart racing or tremoring.  2.  Fibromyalgia Patient notes that she did not tolerate gabapentin.  She took a pill and the following day she had difficulty concentrating and focusing.  She discontinued the medication has been doing fine since.  She notes that overall fibromyalgia is stable.  She continues to have intermittent pains that she takes Tylenol with some relief for her.  She notes that symptoms are never as well-controlled as they were when she was able to take oral NSAIDs.  She is currently anticoagulated with Eliquis for atrial fibrillation  ROS: Per HPI  Allergies  Allergen Reactions  . Aggrenox [Aspirin-Dipyridamole Er] Anaphylaxis    Severe headache  . Codeine Other (See Comments)    Feet burn and agitation  . Morphine And Related Other (See Comments)    Feet burn and agitation   Past Medical History:  Diagnosis Date  . Anemia    takes Iron pill daily  . Arthritis   . Carotid artery occlusion   . Fibromyalgia   . Fibromyalgia   . Glaucoma    uses Eye Drops daily  . HOH (hard of hearing)   . Hyperlipidemia    takes Atorvastatin daily  . Hypertension    takes Benazepril daily  . Hypothyroidism    takes Synthroid daily  . Macular degeneration    wet and gets injections in both eyes  . Nocturia   . Peripheral vascular disease ([place])   . Rectal incontinence   . Stroke ([place])   . 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 10/28/2013    Current Outpatient Medications:  .  Acetaminophen (TYLENOL PO), Take 1-2 tablets by mouth every 6 (six) hours as needed (pain/headache)., Disp: , Rfl:  .  amLODipine (NORVASC) 10 MG tablet, Take 1 tablet (10 mg total) by mouth daily., Disp: 90 tablet,  Rfl: 3 .  aspirin 325 MG EC tablet, Take 325 mg by mouth daily. , Disp: , Rfl:  .  atorvastatin (LIPITOR) 20 MG tablet, Take 1 tablet (20 mg total) by mouth daily at 6 PM., Disp: 90 tablet, Rfl: 3 .  benazepril (LOTENSIN) 20 MG tablet, Take 1 tablet (20 mg total) by mouth daily., Disp: 90 tablet, Rfl: 3 .  Besifloxacin HCl (BESIVANCE) 0.6 % SUSP, Place 1 drop into both eyes See admin instructions. Instill one drop into both eyes 4 times daily on the day of and the day after eye injections - once a month, Disp: , Rfl:  .  bimatoprost (LUMIGAN) 0.01 % SOLN, Place 1 drop into both eyes at bedtime., Disp: , Rfl:  .  Calcium Carbonate-Vitamin D (CALCIUM 600/VITAMIN D) 600-400 MG-UNIT per tablet, Take 1 tablet by mouth daily with lunch. , Disp: , Rfl:  .  diltiazem (TIAZAC) 180 MG 24 hr capsule, Take 180 mg by mouth daily., Disp: , Rfl:  .  ELIQUIS 2.5 MG TABS tablet, Take 2.5 mg by mouth 2 (two) times a day., Disp: , Rfl:  .  gabapentin (NEURONTIN) 100 MG capsule, Take 1 cap at bedtime x1 week. Then increase to 2 caps x1 week. Then increase 3 capsules qhs., Disp: 90 capsule, Rfl: 0 .  iron polysaccharides (FERREX 150) 150 MG capsule, TAKE 1 CAPSULE  BY MOUTH TWICE A DAY BEFORE LUNCH AND SUPPER AS INSTRUCTED, Disp: 180 capsule, Rfl: 0 .  levothyroxine (SYNTHROID) 75 MCG tablet, TAKE 1 TABLET (75 MCG TOTAL) BY MOUTH DAILY BEFORE BREAKFAST., Disp: 90 tablet, Rfl: 1 .  Multiple Vitamin (MULTIVITAMIN WITH MINERALS) TABS tablet, Take 1 tablet by mouth daily., Disp: , Rfl:  .  Omega-3 Fatty Acids (FISH OIL) 1200 MG CAPS, Take 1,200 mg by mouth daily with supper. , Disp: , Rfl:  .  polyethylene glycol powder (GLYCOLAX/MIRALAX) powder, Mix 1 capful (17g) in 8 ounces of water and drink 1 to 2 times daily as needed for constipation., Disp: 225 g, Rfl: 1 .  triamcinolone cream (KENALOG) 0.1 %, Apply 1 application topically 2 (two) times daily as needed (scalp itching)., Disp: , Rfl:  Social History   Socioeconomic  History  . Marital status: Widowed    Spouse name: Not on file  . Number of children: Not on file  . Years of education: Not on file  . Highest education level: Not on file  Occupational History  . Not on file  Social Needs  . Financial resource strain: Not on file  . Food insecurity    Worry: Not on file    Inability: Not on file  . Transportation needs    Medical: Not on file    Non-medical: Not on file  Tobacco Use  . Smoking status: Former Research scientist (life sciences)  . Smokeless tobacco: Never Used  . Tobacco comment: quit smoking 71yrs ago  Substance and Sexual Activity  . Alcohol use: No    Alcohol/week: 0.0 standard drinks  . Drug use: No  . Sexual activity: Yes    Birth control/protection: Post-menopausal  Lifestyle  . Physical activity    Days per week: Not on file    Minutes per session: Not on file  . Stress: Not on file  Relationships  . Social Herbalist on phone: Not on file    Gets together: Not on file    Attends religious service: Not on file    Active member of club or organization: Not on file    Attends meetings of clubs or organizations: Not on file    Relationship status: Not on file  . Intimate partner violence    Fear of current or ex partner: Not on file    Emotionally abused: Not on file    Physically abused: Not on file    Forced sexual activity: Not on file  Other Topics Concern  . Not on file  Social History Narrative  . Not on file   Family History  Problem Relation Age of Onset  . Prostate cancer Father   . Cancer Father   . Lung disease Sister   . Cancer Brother     Objective: Office vital signs reviewed. BP (!) 142/57   Pulse 61   Temp 98.4 F (36.9 C) (Temporal)   Resp 20   Ht 5\' 3"  (1.6 m)   Wt 126 lb 9.6 oz (57.4 kg)   SpO2 98%   BMI 22.43 kg/m   Physical Examination:  General: Awake, alert, well nourished, No acute distress HEENT: Normal. No conjunctival pallor Cardio: seemingly regular rate and rhythm, S1S2 heard, no  murmurs appreciated Pulm: clear to auscultation bilaterally, no wheezes, rhonchi or rales; normal work of breathing on room air Extremities: warm, well perfused, No edema, cyanosis or clubbing; +2 pulses bilaterally Skin: Normal temperature Neuro: No tremor  Assessment/ Plan: 85 y.o. female  1. Hypothyroidism, unspecified type Asymptomatic.  Check thyroid panel - Thyroid Panel With TSH  2. Paroxysmal atrial fibrillation (HCC) Rate controlled.  No red flag - CBC with Differential  3. Elevated alkaline phosphatase level Uncertain etiology.  Gamma GT was ordered by a separate provider and this will be collected during today's lab draw - Gamma GT  4. Leukocytosis, unspecified type Uncertain etiology.  Check CBC - CBC with Differential  5. Pre-diabetes Check A1c - Bayer DCA Hb A1c Waived  6. Need for immunization against influenza Administered. - Flu Vaccine QUAD High Dose(Fluad)  7. Fibromyalgia muscle pain Stable.  Intolerant to gabapentin.   Orders Placed This Encounter  Procedures  . CBC with Differential  . Gamma GT  . Bayer DCA Hb A1c Waived  . Thyroid Panel With TSH   No orders of the defined types were placed in this encounter.    [name], DO [place] [phone]

## 2018-11-20 ENCOUNTER — Telehealth: Payer: Self-pay | Admitting: Family Medicine

## 2018-11-20 LAB — THYROID PANEL WITH TSH
Free Thyroxine Index: 2.6 (ref 1.2–4.9)
T3 Uptake Ratio: 29 % (ref 24–39)
T4, Total: 8.8 ug/dL (ref 4.5–12.0)
TSH: 1.37 u[IU]/mL (ref 0.450–4.500)

## 2018-11-20 LAB — CBC WITH DIFFERENTIAL/PLATELET
Basophils Absolute: 0.1 10*3/uL (ref 0.0–0.2)
Basos: 1 %
EOS (ABSOLUTE): 0.2 10*3/uL (ref 0.0–0.4)
Eos: 2 %
Hematocrit: 38.4 % (ref 34.0–46.6)
Hemoglobin: 12.5 g/dL (ref 11.1–15.9)
Immature Grans (Abs): 0 10*3/uL (ref 0.0–0.1)
Immature Granulocytes: 0 %
Lymphocytes Absolute: 2.4 10*3/uL (ref 0.7–3.1)
Lymphs: 31 %
MCH: 28.5 pg (ref 26.6–33.0)
MCHC: 32.6 g/dL (ref 31.5–35.7)
MCV: 88 fL (ref 79–97)
Monocytes Absolute: 0.7 10*3/uL (ref 0.1–0.9)
Monocytes: 9 %
Neutrophils Absolute: 4.5 10*3/uL (ref 1.4–7.0)
Neutrophils: 57 %
Platelets: 309 10*3/uL (ref 150–450)
RBC: 4.38 x10E6/uL (ref 3.77–5.28)
RDW: 13.3 % (ref 11.7–15.4)
WBC: 7.9 10*3/uL (ref 3.4–10.8)

## 2018-11-20 LAB — GAMMA GT: GGT: 19 IU/L (ref 0–60)

## 2018-11-20 NOTE — Telephone Encounter (Signed)
Diltiazem prescribed from historical provider 07/08/2018.  Advised to contact cardiologist to see if she needs to be taking this medication.

## 2018-11-21 ENCOUNTER — Other Ambulatory Visit: Payer: Self-pay | Admitting: Family Medicine

## 2018-11-21 MED ORDER — LEVOTHYROXINE SODIUM 75 MCG PO TABS: 75.0000 ug | ORAL_TABLET | Freq: Every day | ORAL | 1 refills | Status: DC

## 2018-11-26 ENCOUNTER — Encounter: Payer: Self-pay | Admitting: *Deleted

## 2018-12-03 ENCOUNTER — Other Ambulatory Visit: Payer: Self-pay

## 2018-12-03 ENCOUNTER — Encounter (INDEPENDENT_AMBULATORY_CARE_PROVIDER_SITE_OTHER): Payer: Federal, State, Local not specified - PPO | Admitting: Ophthalmology

## 2018-12-03 DIAGNOSIS — H43813 Vitreous degeneration, bilateral: Secondary | ICD-10-CM | POA: Diagnosis not present

## 2018-12-03 DIAGNOSIS — H35033 Hypertensive retinopathy, bilateral: Secondary | ICD-10-CM

## 2018-12-03 DIAGNOSIS — I10 Essential (primary) hypertension: Secondary | ICD-10-CM

## 2018-12-03 DIAGNOSIS — H353231 Exudative age-related macular degeneration, bilateral, with active choroidal neovascularization: Secondary | ICD-10-CM

## 2019-01-02 ENCOUNTER — Encounter (INDEPENDENT_AMBULATORY_CARE_PROVIDER_SITE_OTHER): Payer: Federal, State, Local not specified - PPO | Admitting: Ophthalmology

## 2019-01-02 ENCOUNTER — Other Ambulatory Visit: Payer: Self-pay

## 2019-01-02 DIAGNOSIS — H353231 Exudative age-related macular degeneration, bilateral, with active choroidal neovascularization: Secondary | ICD-10-CM

## 2019-01-02 DIAGNOSIS — H43813 Vitreous degeneration, bilateral: Secondary | ICD-10-CM | POA: Diagnosis not present

## 2019-01-02 DIAGNOSIS — I10 Essential (primary) hypertension: Secondary | ICD-10-CM

## 2019-01-02 DIAGNOSIS — H35033 Hypertensive retinopathy, bilateral: Secondary | ICD-10-CM

## 2019-01-11 ENCOUNTER — Other Ambulatory Visit: Payer: Self-pay | Admitting: Family Medicine

## 2019-01-13 ENCOUNTER — Other Ambulatory Visit: Payer: Self-pay | Admitting: Family Medicine

## 2019-01-13 MED ORDER — LEVOTHYROXINE SODIUM 75 MCG PO TABS: 75.0000 ug | ORAL_TABLET | Freq: Every day | ORAL | 1 refills | Status: DC

## 2019-01-13 NOTE — Telephone Encounter (Signed)
done

## 2019-01-13 NOTE — Telephone Encounter (Signed)
What is the name of the medication? Levothyroxine 75 MCG #90  Have you contacted your pharmacy to request a refill? YES  Which pharmacy would you like this sent to? [facility] in [place]   Patient notified that their request is being sent to the clinical staff for review and that they should receive a call once it is complete. If they do not receive a call within 24 hours they can check with their pharmacy or our office.

## 2019-01-13 NOTE — Telephone Encounter (Signed)
Patient is no longer on Diltiazem.  This was discontinued by her cardiologist.  She should be on metoprolol for afib.  Please make sure patient is not taking this med.

## 2019-01-29 ENCOUNTER — Encounter (INDEPENDENT_AMBULATORY_CARE_PROVIDER_SITE_OTHER): Payer: Federal, State, Local not specified - PPO | Admitting: Ophthalmology

## 2019-01-29 DIAGNOSIS — I10 Essential (primary) hypertension: Secondary | ICD-10-CM

## 2019-01-29 DIAGNOSIS — H35033 Hypertensive retinopathy, bilateral: Secondary | ICD-10-CM | POA: Diagnosis not present

## 2019-01-29 DIAGNOSIS — H353231 Exudative age-related macular degeneration, bilateral, with active choroidal neovascularization: Secondary | ICD-10-CM

## 2019-01-29 DIAGNOSIS — H43813 Vitreous degeneration, bilateral: Secondary | ICD-10-CM

## 2019-02-05 ENCOUNTER — Other Ambulatory Visit: Payer: Self-pay

## 2019-02-05 ENCOUNTER — Ambulatory Visit (INDEPENDENT_AMBULATORY_CARE_PROVIDER_SITE_OTHER): Payer: Federal, State, Local not specified - PPO | Admitting: Family Medicine

## 2019-02-05 ENCOUNTER — Encounter: Payer: Self-pay | Admitting: Family Medicine

## 2019-02-05 DIAGNOSIS — W57XXXA Bitten or stung by nonvenomous insect and other nonvenomous arthropods, initial encounter: Secondary | ICD-10-CM

## 2019-02-05 DIAGNOSIS — S80862A Insect bite (nonvenomous), left lower leg, initial encounter: Secondary | ICD-10-CM | POA: Diagnosis not present

## 2019-02-05 MED ORDER — DOXYCYCLINE HYCLATE 100 MG PO TABS: 100.0000 mg | ORAL_TABLET | Freq: Two times a day (BID) | ORAL | 0 refills | Status: AC

## 2019-02-05 NOTE — Progress Notes (Addendum)
Virtual Visit via Telephone Note  I connected with Jenna Ramos on 02/05/19 at 11:11 AM by telephone and verified that I am speaking with the correct person using two identifiers. Jenna Ramos is currently located at home and nobody is currently with her during this visit. The provider, Loman Brooklyn, FNP is located in their office at time of visit.  I discussed the limitations, risks, security and privacy concerns of performing an evaluation and management service by telephone and the availability of in person appointments. I also discussed with the patient that there may be a patient responsible charge related to this service. The patient expressed understanding and agreed to proceed.  Subjective: PCP: [name], DO  Chief Complaint  Patient presents with  . Tick Removal   Reports she was outside working in the yard over the weekend.  On Monday she found a tick on her left leg that she reports she removed.  Last night she noticed that her leg was starting to turn a little red.  This morning she reports there was a red ring with a white center in the area that she moved the tick   ROS: Per HPI  Current Outpatient Medications:  .  Acetaminophen (TYLENOL PO), Take 1-2 tablets by mouth every 6 (six) hours as needed (pain/headache)., Disp: , Rfl:  .  amLODipine (NORVASC) 10 MG tablet, Take 1 tablet (10 mg total) by mouth daily., Disp: 90 tablet, Rfl: 3 .  atorvastatin (LIPITOR) 20 MG tablet, Take 1 tablet (20 mg total) by mouth daily at 6 PM., Disp: 90 tablet, Rfl: 3 .  benazepril (LOTENSIN) 20 MG tablet, Take 1 tablet (20 mg total) by mouth daily., Disp: 90 tablet, Rfl: 3 .  Besifloxacin HCl (BESIVANCE) 0.6 % SUSP, Place 1 drop into both eyes See admin instructions. Instill one drop into both eyes 4 times daily on the day of and the day after eye injections - once a month, Disp: , Rfl:  .  bimatoprost (LUMIGAN) 0.01 % SOLN, Place 1 drop into both eyes at  bedtime., Disp: , Rfl:  .  Calcium Carbonate-Vitamin D (CALCIUM 600/VITAMIN D) 600-400 MG-UNIT per tablet, Take 1 tablet by mouth daily with lunch. , Disp: , Rfl:  .  diltiazem (TIAZAC) 180 MG 24 hr capsule, Take 180 mg by mouth daily., Disp: , Rfl:  .  ELIQUIS 2.5 MG TABS tablet, Take 2.5 mg by mouth 2 (two) times a day., Disp: , Rfl:  .  iron polysaccharides (FERREX 150) 150 MG capsule, TAKE 1 CAPSULE BY MOUTH TWICE A DAY BEFORE LUNCH AND SUPPER AS INSTRUCTED (Patient not taking: Reported on 11/19/2018), Disp: 180 capsule, Rfl: 0 .  levothyroxine (SYNTHROID) 75 MCG tablet, Take 1 tablet (75 mcg total) by mouth daily before breakfast., Disp: 90 tablet, Rfl: 1 .  metoprolol succinate (TOPROL-XL) 50 MG 24 hr tablet, Take 50 mg by mouth daily., Disp: , Rfl:  .  Multiple Vitamin (MULTIVITAMIN WITH MINERALS) TABS tablet, Take 1 tablet by mouth daily., Disp: , Rfl:  .  Omega-3 Fatty Acids (FISH OIL) 1200 MG CAPS, Take 1,200 mg by mouth daily with supper. , Disp: , Rfl:  .  polyethylene glycol powder (GLYCOLAX/MIRALAX) powder, Mix 1 capful (17g) in 8 ounces of water and drink 1 to 2 times daily as needed for constipation., Disp: 225 g, Rfl: 1 .  triamcinolone cream (KENALOG) 0.1 %, Apply 1 application topically 2 (two) times daily as needed (scalp itching)., Disp: , Rfl:  Allergies  Allergen Reactions  . Aggrenox [Aspirin-Dipyridamole Er] Anaphylaxis    Severe headache  . Codeine Other (See Comments)    Feet burn and agitation  . Morphine And Related Other (See Comments)    Feet burn and agitation  . Gabapentin     Difficulty concentrating   Past Medical History:  Diagnosis Date  . Anemia    takes Iron pill daily  . Arthritis   . Carotid artery occlusion   . Fibromyalgia   . Fibromyalgia   . Glaucoma    uses Eye Drops daily  . HOH (hard of hearing)   . Hyperlipidemia    takes Atorvastatin daily  . Hypertension    takes Benazepril daily  . Hypothyroidism    takes Synthroid daily  .  Macular degeneration    wet and gets injections in both eyes  . Nocturia   . Peripheral vascular disease ([place])   . Rectal incontinence   . Stroke ([place])   . 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 10/28/2013    Observations/Objective: A&O  No respiratory distress or wheezing audible over the phone Mood, judgement, and thought processes all WNL   Assessment and Plan: 1. Tick bite, initial encounter - Treating as Lyme disease due to bullseye appearance.  - doxycycline (VIBRA-TABS) 100 MG tablet; Take 1 tablet (100 mg total) by mouth 2 (two) times daily for 14 days. 1 po bid  Dispense: 28 tablet; Refill: 0   Follow Up Instructions:  I discussed the assessment and treatment plan with the patient. The patient was provided an opportunity to ask questions and all were answered. The patient agreed with the plan and demonstrated an understanding of the instructions.   The patient was advised to call back or seek an in-person evaluation if the symptoms worsen or if the condition fails to improve as anticipated.  The above assessment and management plan was discussed with the patient. The patient verbalized understanding of and has agreed to the management plan. Patient is aware to call the clinic if symptoms persist or worsen. Patient is aware when to return to the clinic for a follow-up visit. Patient educated on when it is appropriate to go to the emergency department.   Time call ended: 11:17 AM  I provided 8 minutes of non-face-to-face time during this encounter.  [name], MSN, APRN, FNP-C [facility] 02/05/19

## 2019-02-26 ENCOUNTER — Encounter (INDEPENDENT_AMBULATORY_CARE_PROVIDER_SITE_OTHER): Payer: Federal, State, Local not specified - PPO | Admitting: Ophthalmology

## 2019-02-26 DIAGNOSIS — H43813 Vitreous degeneration, bilateral: Secondary | ICD-10-CM

## 2019-02-26 DIAGNOSIS — H35033 Hypertensive retinopathy, bilateral: Secondary | ICD-10-CM | POA: Diagnosis not present

## 2019-02-26 DIAGNOSIS — I10 Essential (primary) hypertension: Secondary | ICD-10-CM

## 2019-02-26 DIAGNOSIS — H353231 Exudative age-related macular degeneration, bilateral, with active choroidal neovascularization: Secondary | ICD-10-CM

## 2019-03-26 ENCOUNTER — Encounter (INDEPENDENT_AMBULATORY_CARE_PROVIDER_SITE_OTHER): Payer: Medicare Other | Admitting: Ophthalmology

## 2019-03-26 DIAGNOSIS — H353231 Exudative age-related macular degeneration, bilateral, with active choroidal neovascularization: Secondary | ICD-10-CM | POA: Diagnosis not present

## 2019-03-26 DIAGNOSIS — I10 Essential (primary) hypertension: Secondary | ICD-10-CM | POA: Diagnosis not present

## 2019-03-26 DIAGNOSIS — H35033 Hypertensive retinopathy, bilateral: Secondary | ICD-10-CM

## 2019-03-26 DIAGNOSIS — H43813 Vitreous degeneration, bilateral: Secondary | ICD-10-CM | POA: Diagnosis not present

## 2019-04-23 ENCOUNTER — Encounter (INDEPENDENT_AMBULATORY_CARE_PROVIDER_SITE_OTHER): Payer: Federal, State, Local not specified - PPO | Admitting: Ophthalmology

## 2019-04-23 DIAGNOSIS — H43813 Vitreous degeneration, bilateral: Secondary | ICD-10-CM | POA: Diagnosis not present

## 2019-04-23 DIAGNOSIS — I10 Essential (primary) hypertension: Secondary | ICD-10-CM

## 2019-04-23 DIAGNOSIS — H35033 Hypertensive retinopathy, bilateral: Secondary | ICD-10-CM

## 2019-04-23 DIAGNOSIS — H353231 Exudative age-related macular degeneration, bilateral, with active choroidal neovascularization: Secondary | ICD-10-CM | POA: Diagnosis not present

## 2019-04-25 ENCOUNTER — Ambulatory Visit: Payer: Medicare Other | Admitting: Family Medicine

## 2019-05-19 ENCOUNTER — Other Ambulatory Visit: Payer: Self-pay

## 2019-05-19 ENCOUNTER — Ambulatory Visit (INDEPENDENT_AMBULATORY_CARE_PROVIDER_SITE_OTHER): Payer: Federal, State, Local not specified - PPO | Admitting: Family Medicine

## 2019-05-19 VITALS — BP 139/60 | HR 60 | Temp 97.5°F | Ht 63.0 in | Wt 129.2 lb

## 2019-05-19 DIAGNOSIS — R748 Abnormal levels of other serum enzymes: Secondary | ICD-10-CM | POA: Diagnosis not present

## 2019-05-19 DIAGNOSIS — R151 Fecal smearing: Secondary | ICD-10-CM

## 2019-05-19 DIAGNOSIS — I480 Paroxysmal atrial fibrillation: Secondary | ICD-10-CM

## 2019-05-19 DIAGNOSIS — I10 Essential (primary) hypertension: Secondary | ICD-10-CM

## 2019-05-19 DIAGNOSIS — E034 Atrophy of thyroid (acquired): Secondary | ICD-10-CM | POA: Diagnosis not present

## 2019-05-19 NOTE — Patient Instructions (Signed)

## 2019-05-19 NOTE — Progress Notes (Signed)
Subjective: CC: thyroid PCP: [name], DO GOV:PCHEKBT Jenna Ramos is a 86 y.o. female presenting to clinic today for:  1. Hypothyroidism Patient reports compliance with Synthroid 75 mcg daily.  No unplanned weight loss, change in bowel habits, difficulty swallowing, change in voice or tremor.  2.  Hypertension/atrial fibrillation Patient reports compliance with beta-blocker, diltiazem, Eliquis, benazepril.  No chest pain, shortness of breath.  Denies any change in exercise tolerance.  No lower extremity edema.  She does note that the Eliquis is about $300 per 33-monthmedication.  She would like some samples of this if we have some available.  No hematochezia, melena, hematuria or vaginal bleeding.  3.  Fecal incontinence Patient reports intermittent fecal incontinence has been ongoing for 8+ years.  She notes that some things like bending over will cause her to lose control of her bowels.  She will have small pellet-like stools as a result.  No abdominal pain.  No rectal pain.  No rectal bleeding.  ROS: Per HPI  Allergies  Allergen Reactions  . Aggrenox [Aspirin-Dipyridamole Er] Anaphylaxis    Severe headache  . Codeine Other (See Comments)    Feet burn and agitation  . Morphine And Related Other (See Comments)    Feet burn and agitation  . Gabapentin     Difficulty concentrating   Past Medical History:  Diagnosis Date  . Anemia    takes Iron pill daily  . Arthritis   . Carotid artery occlusion   . Fibromyalgia   . Fibromyalgia   . Glaucoma    uses Eye Drops daily  . HOH (hard of hearing)   . Hyperlipidemia    takes Atorvastatin daily  . Hypertension    takes Benazepril daily  . Hypothyroidism    takes Synthroid daily  . Macular degeneration    wet and gets injections in both eyes  . Nocturia   . [place] disease ([place]   . Rectal incontinence   . [place] ([place]   . 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 10/28/2013     Current Outpatient Medications:  .  Acetaminophen (TYLENOL PO), Take 1-2 tablets by mouth every 6 (six) hours as needed (pain/headache)., Disp: , Rfl:  .  amLODipine (NORVASC) 10 MG tablet, Take 1 tablet (10 mg total) by mouth daily., Disp: 90 tablet, Rfl: 3 .  atorvastatin (LIPITOR) 20 MG tablet, Take 1 tablet (20 mg total) by mouth daily at 6 PM., Disp: 90 tablet, Rfl: 3 .  benazepril (LOTENSIN) 20 MG tablet, Take 1 tablet (20 mg total) by mouth daily., Disp: 90 tablet, Rfl: 3 .  Besifloxacin HCl (BESIVANCE) 0.6 % SUSP, Place 1 drop into both eyes See admin instructions. Instill one drop into both eyes 4 times daily on the day of and the day after eye injections - once a month, Disp: , Rfl:  .  bimatoprost (LUMIGAN) 0.01 % SOLN, Place 1 drop into both eyes at bedtime., Disp: , Rfl:  .  Calcium Carbonate-Vitamin D (CALCIUM 600/VITAMIN D) 600-400 MG-UNIT per tablet, Take 1 tablet by mouth daily with lunch. , Disp: , Rfl:  .  diltiazem (TIAZAC) 180 MG 24 hr capsule, Take 180 mg by mouth daily., Disp: , Rfl:  .  ELIQUIS 2.5 MG TABS tablet, Take 2.5 mg by mouth 2 (two) times a day., Disp: , Rfl:  .  iron polysaccharides (FERREX 150) 150 MG capsule, TAKE 1 CAPSULE BY MOUTH TWICE A DAY BEFORE LUNCH AND SUPPER  AS INSTRUCTED (Patient not taking: Reported on 11/19/2018), Disp: 180 capsule, Rfl: 0 .  levothyroxine (SYNTHROID) 75 MCG tablet, Take 1 tablet (75 mcg total) by mouth daily before breakfast., Disp: 90 tablet, Rfl: 1 .  metoprolol succinate (TOPROL-XL) 50 MG 24 hr tablet, Take 50 mg by mouth daily., Disp: , Rfl:  .  Multiple Vitamin (MULTIVITAMIN WITH MINERALS) TABS tablet, Take 1 tablet by mouth daily., Disp: , Rfl:  .  Omega-3 Fatty Acids (FISH OIL) 1200 MG CAPS, Take 1,200 mg by mouth daily with supper. , Disp: , Rfl:  .  polyethylene glycol powder (GLYCOLAX/MIRALAX) powder, Mix 1 capful (17g) in 8 ounces of water and drink 1 to 2 times daily as needed for constipation., Disp: 225 g, Rfl: 1 .   triamcinolone cream (KENALOG) 0.1 %, Apply 1 application topically 2 (two) times daily as needed (scalp itching)., Disp: , Rfl:  Social History   Socioeconomic History  . Marital status: Widowed    Spouse name: Not on file  . Number of children: Not on file  . Years of education: Not on file  . Highest education level: Not on file  Occupational History  . Not on file  Tobacco Use  . Smoking status: Former Research scientist (life sciences)  . Smokeless tobacco: Never Used  . Tobacco comment: quit smoking 64yr ago  Substance and Sexual Activity  . Alcohol use: No    Alcohol/week: 0.0 standard drinks  . Drug use: No  . Sexual activity: Yes    Birth control/protection: Post-menopausal  Other Topics Concern  . Not on file  Social History Narrative  . Not on file   Social Determinants of Health   Financial Resource Strain:   . Difficulty of Paying Living Expenses:   Food Insecurity:   . Worried About RCharity fundraiserin the Last Year:   . RArboriculturistin the Last Year:   Transportation Needs:   . LFilm/video editor(Medical):   .Marland KitchenLack of Transportation (Non-Medical):   Physical Activity:   . Days of Exercise per Week:   . Minutes of Exercise per Session:   Stress:   . Feeling of Stress :   Social Connections:   . Frequency of Communication with Friends and Family:   . Frequency of Social Gatherings with Friends and Family:   . Attends Religious Services:   . Active Member of Clubs or Organizations:   . Attends CArchivistMeetings:   .Marland KitchenMarital Status:   Intimate Partner Violence:   . Fear of Current or Ex-Partner:   . Emotionally Abused:   .Marland KitchenPhysically Abused:   . Sexually Abused:    Family History  Problem Relation Age of Onset  . Prostate cancer Father   . Cancer Father   . Lung disease Sister   . Cancer Brother     Objective: Office vital signs reviewed. BP 139/60   Pulse 60   Temp (!) 97.5 F (36.4 C) (Temporal)   Ht _0  (1.6 m)   Wt 129 lb 3.2 oz (58.6  kg)   SpO2 99%   BMI 22.89 kg/m   Physical Examination:  General: Awake, alert, well nourished, No acute distress HEENT: Normal. No conjunctival pallor Cardio: seemingly regular rate and rhythm, S1S2 heard, no murmurs appreciated Pulm: clear to auscultation bilaterally, no wheezes, rhonchi or rales; normal work of breathing on room air Extremities: warm, well perfused, No edema, cyanosis or clubbing; +2 pulses bilaterally Skin: Normal temperature Neuro: No  tremor  Assessment/ Plan: 86 y.o. female   1. Essential hypertension Controlled.  Continue current regimen - CMP14+EGFR  2. Paroxysmal atrial fibrillation (HCC) Rate and rhythm controlled today.  No issues with anticoagulation - CBC  3. Hypothyroidism due to acquired atrophy of thyroid Asymptomatic - Thyroid Panel With TSH - CMP14+EGFR  4. Elevated serum alkaline phosphatase level - CMP14+EGFR  5. Fecal smearing I offered referral for further evaluation but she declined this today.  We discussed red flag signs and symptoms warranting further evaluation she voiced understanding.  Will follow-up as needed on this issue    No orders of the defined types were placed in this encounter.  No orders of the defined types were placed in this encounter.    [name], DO [place] [phone]

## 2019-05-20 LAB — CBC
Hematocrit: 37.7 % (ref 34.0–46.6)
Hemoglobin: 12.2 g/dL (ref 11.1–15.9)
MCH: 28.2 pg (ref 26.6–33.0)
MCHC: 32.4 g/dL (ref 31.5–35.7)
MCV: 87 fL (ref 79–97)
Platelets: 303 10*3/uL (ref 150–450)
RBC: 4.32 x10E6/uL (ref 3.77–5.28)
RDW: 13 % (ref 11.7–15.4)
WBC: 7.6 10*3/uL (ref 3.4–10.8)

## 2019-05-20 LAB — CMP14+EGFR
ALT: 15 IU/L (ref 0–32)
AST: 19 IU/L (ref 0–40)
Albumin/Globulin Ratio: 1.5 (ref 1.2–2.2)
Albumin: 4.5 g/dL (ref 3.6–4.6)
Alkaline Phosphatase: 80 IU/L (ref 39–117)
BUN/Creatinine Ratio: 20 (ref 12–28)
BUN: 20 mg/dL (ref 8–27)
Bilirubin Total: 0.5 mg/dL (ref 0.0–1.2)
CO2: 25 mmol/L (ref 20–29)
Calcium: 9.8 mg/dL (ref 8.7–10.3)
Chloride: 99 mmol/L (ref 96–106)
Creatinine, Ser: 0.99 mg/dL (ref 0.57–1.00)
GFR calc Af Amer: 58 mL/min/{1.73_m2} — ABNORMAL LOW (ref >59)
GFR calc non Af Amer: 51 mL/min/{1.73_m2} — ABNORMAL LOW (ref >59)
Globulin, Total: 3 g/dL (ref 1.5–4.5)
Glucose: 121 mg/dL — ABNORMAL HIGH (ref 65–99)
Potassium: 4.6 mmol/L (ref 3.5–5.2)
Sodium: 139 mmol/L (ref 134–144)
Total Protein: 7.5 g/dL (ref 6.0–8.5)

## 2019-05-20 LAB — THYROID PANEL WITH TSH
Free Thyroxine Index: 2.5 (ref 1.2–4.9)
T3 Uptake Ratio: 28 % (ref 24–39)
T4, Total: 9 ug/dL (ref 4.5–12.0)
TSH: 1.1 u[IU]/mL (ref 0.450–4.500)

## 2019-05-21 ENCOUNTER — Encounter (INDEPENDENT_AMBULATORY_CARE_PROVIDER_SITE_OTHER): Payer: Federal, State, Local not specified - PPO | Admitting: Ophthalmology

## 2019-05-21 DIAGNOSIS — H35033 Hypertensive retinopathy, bilateral: Secondary | ICD-10-CM

## 2019-05-21 DIAGNOSIS — I10 Essential (primary) hypertension: Secondary | ICD-10-CM

## 2019-05-21 DIAGNOSIS — H353231 Exudative age-related macular degeneration, bilateral, with active choroidal neovascularization: Secondary | ICD-10-CM

## 2019-05-21 DIAGNOSIS — H43813 Vitreous degeneration, bilateral: Secondary | ICD-10-CM | POA: Diagnosis not present

## 2019-06-18 ENCOUNTER — Encounter (INDEPENDENT_AMBULATORY_CARE_PROVIDER_SITE_OTHER): Payer: Federal, State, Local not specified - PPO | Admitting: Ophthalmology

## 2019-06-18 DIAGNOSIS — H353231 Exudative age-related macular degeneration, bilateral, with active choroidal neovascularization: Secondary | ICD-10-CM | POA: Diagnosis not present

## 2019-06-18 DIAGNOSIS — H43813 Vitreous degeneration, bilateral: Secondary | ICD-10-CM

## 2019-06-18 DIAGNOSIS — H35033 Hypertensive retinopathy, bilateral: Secondary | ICD-10-CM | POA: Diagnosis not present

## 2019-06-18 DIAGNOSIS — I10 Essential (primary) hypertension: Secondary | ICD-10-CM | POA: Diagnosis not present

## 2019-07-07 ENCOUNTER — Ambulatory Visit (INDEPENDENT_AMBULATORY_CARE_PROVIDER_SITE_OTHER): Payer: Federal, State, Local not specified - PPO

## 2019-07-07 ENCOUNTER — Encounter: Payer: Self-pay | Admitting: Nurse Practitioner

## 2019-07-07 ENCOUNTER — Other Ambulatory Visit: Payer: Self-pay

## 2019-07-07 ENCOUNTER — Ambulatory Visit: Payer: Federal, State, Local not specified - PPO | Admitting: Nurse Practitioner

## 2019-07-07 VITALS — BP 145/67 | HR 63 | Temp 97.9°F | Resp 20 | Ht 63.0 in | Wt 129.0 lb

## 2019-07-07 DIAGNOSIS — R0781 Pleurodynia: Secondary | ICD-10-CM | POA: Diagnosis not present

## 2019-07-07 DIAGNOSIS — W19XXXA Unspecified fall, initial encounter: Secondary | ICD-10-CM | POA: Diagnosis not present

## 2019-07-07 MED ORDER — PREDNISONE 10 MG (21) PO TBPK: ORAL_TABLET | ORAL | 0 refills | Status: DC

## 2019-07-07 NOTE — Assessment & Plan Note (Addendum)
Patient fell and is having new onset left rib pain. Pain has worsened over the last 24 hours and has not been well managed. due to patients allergies to codeine and Aspirin, education provided for prednisone taper and ice application to affected area with support or guarding when coughing and sneezing. Rx sent to pharmacy.

## 2019-07-07 NOTE — Progress Notes (Signed)
Acute Office Visit  Subjective:    Patient ID: Jenna Ramos, female    DOB: November 21, 1928, 86 y.o.   MRN: [id number]  Chief Complaint  Patient presents with  . left side rib pain    fall SUN 130 am     HPI Patient is in today for left rib pain, patient fell on Sunday while taking her dog outside, she fell to the ground and hit her left side. Patient describe her pain as throbbing and has gradually worsened in the last 24 hours. With increase difficulty taking deep breath due to pain of 7 out of 10. Her skin is not red or bruised from the fall. Tylenol has not helped with pain symptoms. Patient wants to make sure she has no broken ribs.   Past Medical History:  Diagnosis Date  . Anemia    takes Iron pill daily  . Arthritis   . Carotid artery occlusion   . Fibromyalgia   . Fibromyalgia   . Glaucoma    uses Eye Drops daily  . HOH (hard of hearing)   . Hyperlipidemia    takes Atorvastatin daily  . Hypertension    takes Benazepril daily  . Hypothyroidism    takes Synthroid daily  . Macular degeneration    wet and gets injections in both eyes  . Nocturia   . Peripheral vascular disease ([place])   . Rectal incontinence   . Stroke ([place])   . 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 APPENDECTOMY    . CAROTID ENDARTERECTOMY    . cataract surgery Bilateral   . COLONOSCOPY    . ENDARTERECTOMY Left 02/19/2014   Procedure: ENDARTERECTOMY CAROTID;  Surgeon: Conrad Beecher, MD;  Location: [facility];  Service: Vascular;  Laterality: Left;  . ESOPHAGOGASTRODUODENOSCOPY    . TONSILLECTOMY    . TOTAL HIP ARTHROPLASTY Left 10/2013  . TUBAL LIGATION    . WRIST SURGERY     pins and screws    Family History  Problem Relation Age of Onset  . Prostate cancer Father   . Cancer Father   . Lung disease Sister   . Cancer Brother     Social History   Socioeconomic History  . Marital status: Widowed   Spouse name: Not on file  . Number of children: Not on file  . Years of education: Not on file  . Highest education level: Not on file  Occupational History  . Not on file  Tobacco Use  . Smoking status: Former Research scientist (life sciences)  . Smokeless tobacco: Never Used  . Tobacco comment: quit smoking 36yrs ago  Substance and Sexual Activity  . Alcohol use: No    Alcohol/week: 0.0 standard drinks  . Drug use: No  . Sexual activity: Yes    Birth control/protection: Post-menopausal  Other Topics Concern  . Not on file  Social History Narrative  . Not on file   Social Determinants of Health   Financial Resource Strain:   . Difficulty of Paying Living Expenses:   Food Insecurity:   . Worried About Charity fundraiser in the Last Year:   . Arboriculturist in the Last Year:   Transportation Needs:   . Film/video editor (Medical):   Marland Kitchen Lack of Transportation (Non-Medical):   Physical Activity:   . Days of Exercise per Week:   . Minutes of Exercise per Session:   Stress:   .  Feeling of Stress :   Social Connections:   . Frequency of Communication with Friends and Family:   . Frequency of Social Gatherings with Friends and Family:   . Attends Religious Services:   . Active Member of Clubs or Organizations:   . Attends Archivist Meetings:   Marland Kitchen Marital Status:   Intimate Partner Violence:   . Fear of Current or Ex-Partner:   . Emotionally Abused:   Marland Kitchen Physically Abused:   . Sexually Abused:     Outpatient Medications Prior to Visit  Medication Sig Dispense Refill  . Acetaminophen (TYLENOL PO) Take 1-2 tablets by mouth every 6 (six) hours as needed (pain/headache).    Marland Kitchen amLODipine (NORVASC) 10 MG tablet Take 1 tablet (10 mg total) by mouth daily. 90 tablet 3  . atorvastatin (LIPITOR) 20 MG tablet Take 1 tablet (20 mg total) by mouth daily at 6 PM. 90 tablet 3  . benazepril (LOTENSIN) 20 MG tablet Take 1 tablet (20 mg total) by mouth daily. 90 tablet 3  . Besifloxacin HCl  (BESIVANCE) 0.6 % SUSP Place 1 drop into both eyes See admin instructions. Instill one drop into both eyes 4 times daily on the day of and the day after eye injections - once a month    . bimatoprost (LUMIGAN) 0.01 % SOLN Place 1 drop into both eyes at bedtime.    Marland Kitchen diltiazem (TIAZAC) 180 MG 24 hr capsule Take 180 mg by mouth daily.    Marland Kitchen ELIQUIS 2.5 MG TABS tablet Take 2.5 mg by mouth 2 (two) times a day.    . levothyroxine (SYNTHROID) 75 MCG tablet Take 1 tablet (75 mcg total) by mouth daily before breakfast. 90 tablet 1  . metoprolol succinate (TOPROL-XL) 50 MG 24 hr tablet Take 50 mg by mouth daily.    . Multiple Vitamin (MULTIVITAMIN WITH MINERALS) TABS tablet Take 1 tablet by mouth daily.    Marland Kitchen triamcinolone cream (KENALOG) 0.1 % Apply 1 application topically 2 (two) times daily as needed (scalp itching).    . Calcium Carbonate-Vitamin D (CALCIUM 600/VITAMIN D) 600-400 MG-UNIT per tablet Take 1 tablet by mouth daily with lunch.     . iron polysaccharides (FERREX 150) 150 MG capsule TAKE 1 CAPSULE BY MOUTH TWICE A DAY BEFORE LUNCH AND SUPPER AS INSTRUCTED 180 capsule 0  . Omega-3 Fatty Acids (FISH OIL) 1200 MG CAPS Take 1,200 mg by mouth daily with supper.     . polyethylene glycol powder (GLYCOLAX/MIRALAX) powder Mix 1 capful (17g) in 8 ounces of water and drink 1 to 2 times daily as needed for constipation. 225 g 1   No facility-administered medications prior to visit.    Allergies  Allergen Reactions  . Aggrenox [Aspirin-Dipyridamole Er] Anaphylaxis    Severe headache  . Codeine Other (See Comments)    Feet burn and agitation  . Morphine And Related Other (See Comments)    Feet burn and agitation  . Gabapentin     Difficulty concentrating    Review of Systems  Constitutional: Positive for activity change. Negative for appetite change.  Respiratory: Negative for shortness of breath.        Increase pain with breathing (ihalation)  Cardiovascular: Positive for chest pain. Negative  for palpitations.       External pain left rib area.  Genitourinary: Negative for difficulty urinating.  Musculoskeletal: Positive for myalgias.       Patient uses a wheel chair  Skin: Negative for color change.  Neurological:  Negative for dizziness.  Psychiatric/Behavioral: Negative.        Objective:    Physical Exam Constitutional:      Appearance: Normal appearance.  HENT:     Head: Normocephalic.  Eyes:     Conjunctiva/sclera: Conjunctivae normal.  Cardiovascular:     Rate and Rhythm: Normal rate and regular rhythm.     Pulses: Normal pulses.     Heart sounds: Normal heart sounds.  Pulmonary:     Effort: Pulmonary effort is normal.     Breath sounds: Normal breath sounds.  Abdominal:     General: Bowel sounds are normal.  Musculoskeletal:        General: Tenderness present.     Cervical back: Neck supple.  Skin:    General: Skin is warm.     Findings: No bruising or erythema.  Neurological:     Mental Status: She is alert and oriented to person, place, and time.  Psychiatric:        Mood and Affect: Mood normal.        Behavior: Behavior normal.     BP (!) 145/67   Pulse 63   Temp 97.9 F (36.6 C)   Resp 20   Ht 5\' 3"  (1.6 m)   Wt 129 lb (58.5 kg)   SpO2 99%   BMI 22.85 kg/m  Wt Readings from Last 3 Encounters:  07/07/19 129 lb (58.5 kg)  05/19/19 129 lb 3.2 oz (58.6 kg)  11/19/18 126 lb 9.6 oz (57.4 kg)    Health Maintenance Due  Topic Date Due  . DEXA SCAN  Never done    There are no preventive care reminders to display for this patient.   Lab Results  Component Value Date   TSH 1.100 05/19/2019   Lab Results  Component Value Date   WBC 7.6 05/19/2019   HGB 12.2 05/19/2019   HCT 37.7 05/19/2019   MCV 87 05/19/2019   PLT 303 05/19/2019   Lab Results  Component Value Date   NA 139 05/19/2019   K 4.6 05/19/2019   CO2 25 05/19/2019   GLUCOSE 121 (H) 05/19/2019   BUN 20 05/19/2019   CREATININE 0.99 05/19/2019   BILITOT 0.5  05/19/2019   ALKPHOS 80 05/19/2019   AST 19 05/19/2019   ALT 15 05/19/2019   PROT 7.5 05/19/2019   ALBUMIN 4.5 05/19/2019   CALCIUM 9.8 05/19/2019   ANIONGAP 10 12/17/2017   Lab Results  Component Value Date   CHOL 260 (H) 12/04/2017   Lab Results  Component Value Date   HDL 49 12/04/2017   Lab Results  Component Value Date   LDLCALC 140 (H) 12/04/2017   Lab Results  Component Value Date   TRIG 353 (H) 12/04/2017   Lab Results  Component Value Date   CHOLHDL 5.3 12/04/2017   Lab Results  Component Value Date   HGBA1C 6.1 11/19/2018       Assessment & Plan:   Problem List Items Addressed This Visit      Other   Fall - Primary    No injuries from fall, rib wall pain not well controlled and worsening in the last 24 hours, education provided, with imaging.       Relevant Orders   DG Ribs Unilateral W/Chest Left (Completed)   Rib pain on left side    Patient fell and is having new onset left rib pain. Pain has worsened over the last 24 hours and has not been well managed.  Due to patients allergies to codeine and Aspirin, education provided for prednisone taper and ice application to affected area with support or guarding when coughing and sneezing. Rx sent to pharmacy.      Relevant Medications   predniSONE (STERAPRED UNI-PAK 21 TAB) 10 MG (21) TBPK tablet   Other Relevant Orders   DG Ribs Unilateral W/Chest Left (Completed)       Meds ordered this encounter  Medications  . predniSONE (STERAPRED UNI-PAK 21 TAB) 10 MG (21) TBPK tablet    Sig: 6 tablet by mouth day 1, 5 tablet day 2, 4 tablet day 3, 3 tablet day 4, 2 tablet day 5, 1 tablet day 6    Dispense:  1 each    Refill:  0    Order Specific Question:   Supervising Provider    Answer:   Caryl Pina A [id number]     Ivy Lynn, NP

## 2019-07-07 NOTE — Patient Instructions (Signed)
Patient fell and is having new onset left rib pain. Pain has worsened over the last 24 hours and has not been well managed. due to patients allergies to codeine and Aspirin, education provided for prednisone taper and ice application to affected area with support or guarding when coughing and sneezing.      Chest Wall Pain Chest wall pain is pain in or around the bones and muscles of your chest. Chest wall pain may be caused by:  An injury.  Coughing a lot.  Using your chest and arm muscles too much. Sometimes, the cause may not be known. This pain may take a few weeks or longer to get better. Follow these instructions at home: Managing pain, stiffness, and swelling If told, put ice on the painful area:  Put ice in a plastic bag.  Place a towel between your skin and the bag.  Leave the ice on for 20 minutes, 2-3 times a day.  Activity  Rest as told by your doctor.  Avoid doing things that cause pain. This includes lifting heavy items.  Ask your doctor what activities are safe for you. General instructions   Take over-the-counter and prescription medicines only as told by your doctor.  Do not use any products that contain nicotine or tobacco, such as cigarettes, e-cigarettes, and chewing tobacco. If you need help quitting, ask your doctor.  Keep all follow-up visits as told by your doctor. This is important. Contact a doctor if:  You have a fever.  Your chest pain gets worse.  You have new symptoms. Get help right away if:  You feel sick to your stomach (nauseous) or you throw up (vomit).  You feel sweaty or light-headed.  You have a cough with mucus from your lungs (sputum) or you cough up blood.  You are short of breath. These symptoms may be an emergency. Do not wait to see if the symptoms will go away. Get medical help right away. Call your local emergency services (911 in the [place] Do not drive yourself to the hospital. Summary  Chest wall pain is pain  in or around the bones and muscles of your chest.  It may be treated with ice, rest, and medicines. Your condition may also get better if you avoid doing things that cause pain.  Contact a doctor if you have a fever, chest pain that gets worse, or new symptoms.  Get help right away if you feel light-headed or you get short of breath. These symptoms may be an emergency. This information is not intended to replace advice given to you by your health care provider. Make sure you discuss any questions you have with your health care provider. Document Revised: 08/23/2017 Document Reviewed: 08/23/2017 Elsevier Patient Education  2020 [employer].

## 2019-07-07 NOTE — Assessment & Plan Note (Signed)
No injuries from fall, rib wall pain not well controlled and worsening in the last 24 hours, education provided, with imaging.

## 2019-07-10 ENCOUNTER — Ambulatory Visit: Payer: Medicare Other

## 2019-07-16 ENCOUNTER — Encounter (INDEPENDENT_AMBULATORY_CARE_PROVIDER_SITE_OTHER): Payer: Medicare Other | Admitting: Ophthalmology

## 2019-07-18 ENCOUNTER — Encounter: Payer: Self-pay | Admitting: Family Medicine

## 2019-07-18 ENCOUNTER — Ambulatory Visit: Payer: Medicare Other | Admitting: Family Medicine

## 2019-07-23 ENCOUNTER — Other Ambulatory Visit: Payer: Self-pay

## 2019-07-23 ENCOUNTER — Encounter (INDEPENDENT_AMBULATORY_CARE_PROVIDER_SITE_OTHER): Payer: Federal, State, Local not specified - PPO | Admitting: Ophthalmology

## 2019-07-23 DIAGNOSIS — I10 Essential (primary) hypertension: Secondary | ICD-10-CM | POA: Diagnosis not present

## 2019-07-23 DIAGNOSIS — H353231 Exudative age-related macular degeneration, bilateral, with active choroidal neovascularization: Secondary | ICD-10-CM

## 2019-07-23 DIAGNOSIS — H35033 Hypertensive retinopathy, bilateral: Secondary | ICD-10-CM | POA: Diagnosis not present

## 2019-07-23 DIAGNOSIS — H43813 Vitreous degeneration, bilateral: Secondary | ICD-10-CM | POA: Diagnosis not present

## 2019-07-24 ENCOUNTER — Encounter: Payer: Self-pay | Admitting: Physician Assistant

## 2019-07-24 ENCOUNTER — Ambulatory Visit (INDEPENDENT_AMBULATORY_CARE_PROVIDER_SITE_OTHER): Payer: Federal, State, Local not specified - PPO | Admitting: Physician Assistant

## 2019-07-24 VITALS — BP 129/65 | HR 74 | Temp 97.5°F | Resp 20 | Ht 63.0 in | Wt 124.0 lb

## 2019-07-24 DIAGNOSIS — R0781 Pleurodynia: Secondary | ICD-10-CM

## 2019-07-24 NOTE — Progress Notes (Signed)
  Subjective:     Patient ID: Jenna Ramos, female   DOB: 12/07/1928, 86 y.o.   MRN: [id number]  HPI Pt here for f/u of L posterior rib pain Had fall on 5/3 She was seen here and xray neg Pt states started on Prednisone  Due to continued sx was seen at urgent care Sx are improving  Review of Systems  Constitutional: Negative.   Respiratory: Negative.   Cardiovascular: Negative.        Objective:   Physical Exam Constitutional:      General: She is not in acute distress.    Appearance: She is well-developed.  Cardiovascular:     Heart sounds: Normal heart sounds.  Pulmonary:     Effort: Pulmonary effort is normal. No tachypnea, accessory muscle usage or respiratory distress.     Breath sounds: Normal breath sounds. No stridor.  Neurological:     Mental Status: She is alert.    Still with TTP of the L upper post rib area    Assessment:     1. Rib pain on left side        Plan:     Nl course reviewed with pt today Heat/Ice Hold further Pred for now Deep breathing exercises reviewed F/U prn

## 2019-07-24 NOTE — Patient Instructions (Signed)

## 2019-07-28 ENCOUNTER — Other Ambulatory Visit: Payer: Self-pay | Admitting: Family Medicine

## 2019-07-28 MED ORDER — ELIQUIS 2.5 MG PO TABS: 2.5000 mg | ORAL_TABLET | Freq: Two times a day (BID) | ORAL | 0 refills | Status: DC

## 2019-07-28 NOTE — Telephone Encounter (Signed)
Pt aware refill sent to pharmacy  Handicap form filled out and placed on providers desk

## 2019-07-28 NOTE — Telephone Encounter (Signed)
  Prescription Request  07/28/2019  What is the name of the medication or equipment? Eliquis 2.5 mg  Have you contacted your pharmacy to request a refill? (if applicable) NO  Which pharmacy would you like this sent to? [facility]   Patient notified that their request is being sent to the clinical staff for review and that they should receive a response within 2 business days.

## 2019-08-07 ENCOUNTER — Encounter: Payer: Self-pay | Admitting: *Deleted

## 2019-08-11 ENCOUNTER — Encounter: Payer: Self-pay | Admitting: *Deleted

## 2019-08-20 ENCOUNTER — Other Ambulatory Visit: Payer: Self-pay

## 2019-08-20 ENCOUNTER — Encounter (INDEPENDENT_AMBULATORY_CARE_PROVIDER_SITE_OTHER): Payer: Federal, State, Local not specified - PPO | Admitting: Ophthalmology

## 2019-08-20 DIAGNOSIS — H43813 Vitreous degeneration, bilateral: Secondary | ICD-10-CM

## 2019-08-20 DIAGNOSIS — H35033 Hypertensive retinopathy, bilateral: Secondary | ICD-10-CM | POA: Diagnosis not present

## 2019-08-20 DIAGNOSIS — I10 Essential (primary) hypertension: Secondary | ICD-10-CM | POA: Diagnosis not present

## 2019-08-20 DIAGNOSIS — H353231 Exudative age-related macular degeneration, bilateral, with active choroidal neovascularization: Secondary | ICD-10-CM | POA: Diagnosis not present

## 2019-09-17 ENCOUNTER — Other Ambulatory Visit: Payer: Self-pay

## 2019-09-17 ENCOUNTER — Encounter (INDEPENDENT_AMBULATORY_CARE_PROVIDER_SITE_OTHER): Payer: Federal, State, Local not specified - PPO | Admitting: Ophthalmology

## 2019-09-17 DIAGNOSIS — H43813 Vitreous degeneration, bilateral: Secondary | ICD-10-CM | POA: Diagnosis not present

## 2019-09-17 DIAGNOSIS — H353231 Exudative age-related macular degeneration, bilateral, with active choroidal neovascularization: Secondary | ICD-10-CM

## 2019-09-17 DIAGNOSIS — I10 Essential (primary) hypertension: Secondary | ICD-10-CM | POA: Diagnosis not present

## 2019-09-17 DIAGNOSIS — H35033 Hypertensive retinopathy, bilateral: Secondary | ICD-10-CM

## 2019-10-07 ENCOUNTER — Ambulatory Visit (INDEPENDENT_AMBULATORY_CARE_PROVIDER_SITE_OTHER): Payer: Federal, State, Local not specified - PPO | Admitting: Family Medicine

## 2019-10-07 VITALS — BP 120/63

## 2019-10-07 DIAGNOSIS — M797 Fibromyalgia: Secondary | ICD-10-CM

## 2019-10-07 DIAGNOSIS — R3989 Other symptoms and signs involving the genitourinary system: Secondary | ICD-10-CM | POA: Diagnosis not present

## 2019-10-07 MED ORDER — BENAZEPRIL HCL 20 MG PO TABS: 20.0000 mg | ORAL_TABLET | Freq: Every day | ORAL | 3 refills | Status: DC

## 2019-10-07 MED ORDER — ELIQUIS 2.5 MG PO TABS: 2.5000 mg | ORAL_TABLET | Freq: Two times a day (BID) | ORAL | 3 refills | Status: DC

## 2019-10-07 MED ORDER — CEPHALEXIN 500 MG PO CAPS: 500.0000 mg | ORAL_CAPSULE | Freq: Two times a day (BID) | ORAL | 0 refills | Status: DC

## 2019-10-07 MED ORDER — AMLODIPINE BESYLATE 10 MG PO TABS: 10.0000 mg | ORAL_TABLET | Freq: Every day | ORAL | 3 refills | Status: DC

## 2019-10-07 NOTE — Progress Notes (Signed)
Telephone visit  Subjective: CC: UTI PCP: [name], DO [id number] N Jenna Ramos is a 86 y.o. female calls for telephone consult today. Patient provides verbal consent for consult held via phone.  Due to COVID-19 pandemic this visit was conducted virtually. This visit type was conducted due to national recommendations for restrictions regarding the COVID-19 Pandemic (e.g. social distancing, sheltering in place) in an effort to limit this patient's exposure and mitigate transmission in our community. All issues noted in this document were discussed and addressed.  A physical exam was not performed with this format.   Location of patient: home Location of provider: [facility] Others present for call: none  1. Urinary symptoms Patient reports a 4 day h/o urinary frequency, urgency, dysuria.  She reports some fatigue. No hematuria, fevers, chills, abdominal pain, nausea, vomiting, back pain, vaginal discharge.  Patient has been trying to drink more water for symptoms.  She took AZO on Saturday and it did help temporarily.  Patient denies a h/o frequent or recurrent UTIs.    2. Fibromyalgia She reports slight increase in pain since her fall in May. She uses tylenol prn.  Intolerant to gabapentin.  ROS: Per HPI  Allergies  Allergen Reactions   Aggrenox [Aspirin-Dipyridamole Er] Anaphylaxis    Severe headache   Codeine Other (See Comments)    Feet burn and agitation   Morphine And Related Other (See Comments)    Feet burn and agitation   Gabapentin     Difficulty concentrating   Past Medical History:  Diagnosis Date   Anemia    takes Iron pill daily   Arthritis    Carotid artery occlusion    Fibromyalgia    Fibromyalgia    Glaucoma    uses Eye Drops daily   HOH (hard of hearing)    Hyperlipidemia    takes Atorvastatin daily   Hypertension    takes Benazepril daily   Hypothyroidism    takes Synthroid daily   Macular degeneration    wet and gets injections  in both eyes   Nocturia    Peripheral vascular disease ([place])    Rectal incontinence    Stroke [facility])    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 10/28/2013    Current Outpatient Medications:    Acetaminophen (TYLENOL PO), Take 1-2 tablets by mouth every 6 (six) hours as needed (pain/headache)., Disp: , Rfl:    amLODipine (NORVASC) 10 MG tablet, Take 1 tablet (10 mg total) by mouth daily., Disp: 90 tablet, Rfl: 3   atorvastatin (LIPITOR) 20 MG tablet, Take 1 tablet (20 mg total) by mouth daily at 6 PM., Disp: 90 tablet, Rfl: 3   benazepril (LOTENSIN) 20 MG tablet, Take 1 tablet (20 mg total) by mouth daily., Disp: 90 tablet, Rfl: 3   Besifloxacin HCl (BESIVANCE) 0.6 % SUSP, Place 1 drop into both eyes See admin instructions. Instill one drop into both eyes 4 times daily on the day of and the day after eye injections - once a month, Disp: , Rfl:    bimatoprost (LUMIGAN) 0.01 % SOLN, Place 1 drop into both eyes at bedtime., Disp: , Rfl:    diltiazem (TIAZAC) 180 MG 24 hr capsule, Take 180 mg by mouth daily., Disp: , Rfl:    ELIQUIS 2.5 MG TABS tablet, Take 1 tablet (2.5 mg total) by mouth 2 (two) times daily., Disp: 180 tablet, Rfl: 0   levothyroxine (SYNTHROID) 75 MCG tablet, Take 1 tablet (75 mcg total) by mouth daily before  breakfast., Disp: 90 tablet, Rfl: 1   metoprolol succinate (TOPROL-XL) 50 MG 24 hr tablet, Take 50 mg by mouth daily., Disp: , Rfl:    Multiple Vitamin (MULTIVITAMIN WITH MINERALS) TABS tablet, Take 1 tablet by mouth daily., Disp: , Rfl:    triamcinolone cream (KENALOG) 0.1 %, Apply 1 application topically 2 (two) times daily as needed (scalp itching)., Disp: , Rfl:   Assessment/ Plan: 86 y.o. female   1. Suspected UTI Empiric treatment with keflex.  Return precautions reviewed. Follow up Thursday if no significant improvement.  Will collect UA/Cx at that point.  She voiced good understanding and will continue to drink plenty of water. -  cephALEXin (KEFLEX) 500 MG capsule; Take 1 capsule (500 mg total) by mouth 2 (two) times daily for 7 days.  Dispense: 14 capsule; Refill: 0  2. Fibromyalgia muscle pain Discussed Cymbalta vs Lyrica.  She wants to continue her Tylenol for now.  Start time: 11:18am (LVM); 11:24am End time: 11:37am  Total time spent on patient care (including telephone call/ virtual visit): 17 minutes  [place], [place] [phone]

## 2019-10-09 ENCOUNTER — Other Ambulatory Visit: Payer: Self-pay | Admitting: *Deleted

## 2019-10-09 ENCOUNTER — Other Ambulatory Visit: Payer: Medicare Other

## 2019-10-09 ENCOUNTER — Other Ambulatory Visit: Payer: Self-pay

## 2019-10-09 DIAGNOSIS — R300 Dysuria: Secondary | ICD-10-CM

## 2019-10-09 LAB — MICROSCOPIC EXAMINATION
Bacteria, UA: NONE SEEN
Epithelial Cells (non renal): NONE SEEN /hpf (ref 0–10)
RBC, Urine: NONE SEEN /hpf (ref 0–2)
WBC, UA: NONE SEEN /hpf (ref 0–5)

## 2019-10-09 LAB — URINALYSIS, ROUTINE W REFLEX MICROSCOPIC
Bilirubin, UA: NEGATIVE
Ketones, UA: NEGATIVE
Leukocytes,UA: NEGATIVE
Nitrite, UA: NEGATIVE
Protein,UA: NEGATIVE
RBC, UA: NEGATIVE
Specific Gravity, UA: 1.01 (ref 1.005–1.030)
Urobilinogen, Ur: 0.2 mg/dL (ref 0.2–1.0)
pH, UA: 5.5 (ref 5.0–7.5)

## 2019-10-10 ENCOUNTER — Telehealth: Payer: Self-pay | Admitting: Family Medicine

## 2019-10-10 NOTE — Telephone Encounter (Signed)
Pt states she was working in her garden and she only has had 29oz of water to drink today. Pt states she is feeling better now. Advised pt to hydrate and monitor and if she starts to feel worse or continues to feel "fuzzy" to go to [facility] or ED for evaluation and to keep appt with Dr Darnell Level 10/14/19.

## 2019-10-14 ENCOUNTER — Ambulatory Visit (INDEPENDENT_AMBULATORY_CARE_PROVIDER_SITE_OTHER): Payer: Federal, State, Local not specified - PPO | Admitting: Family Medicine

## 2019-10-14 VITALS — BP 134/58

## 2019-10-14 DIAGNOSIS — R151 Fecal smearing: Secondary | ICD-10-CM | POA: Diagnosis not present

## 2019-10-14 DIAGNOSIS — I959 Hypotension, unspecified: Secondary | ICD-10-CM | POA: Diagnosis not present

## 2019-10-14 DIAGNOSIS — M797 Fibromyalgia: Secondary | ICD-10-CM | POA: Diagnosis not present

## 2019-10-14 NOTE — Progress Notes (Signed)
Telephone visit  Subjective: CC: Fibromyalgia, fecal smearing PCP: [name], DO GHW:[id number] Jenna Ramos is a 86 y.o. female calls for telephone consult today. Patient provides verbal consent for consult held via phone.  Due to COVID-19 pandemic this visit was conducted virtually. This visit type was conducted due to national recommendations for restrictions regarding the COVID-19 Pandemic (e.g. social distancing, sheltering in place) in an effort to limit this patient's exposure and mitigate transmission in our community. All issues noted in this document were discussed and addressed.  A physical exam was not performed with this format.   Location of patient: Home Location of provider: [facility] Others present for call: None  1.  Fibromyalgia Patient reports her fibromyalgia was under excellent control when she was taking AZO defense during her urinary tract infection she wanted to get more information about the safety of ongoing use of this medicine.  Denies any bleeding episodes, GI irritation or nausea  2.  Fecal leakage Patient reports ongoing fecal leakage, she believes that this is the etiology of her most recent UTI.  She has been trying to do to Kegel exercises but is still having some leakage.  Again no hematochezia or melena.  3.  Hypertension Patient reports that when she was coming up from her garden recently she had an episode of hypotension.  She realized that she needed to check her blood pressure when she started feeling off.  Denies any falls or loss of consciousness.  Blood pressures were 90s over 50s.  After a bit of time it did come up into the 967E systolic.  Her blood pressures since have been running systolics of 938B to 017P.  She is compliant with her medications.  She hydrates very well.    ROS: Per HPI  Allergies  Allergen Reactions  . Aggrenox [Aspirin-Dipyridamole Er] Anaphylaxis    Severe headache  . Codeine Other (See Comments)    Feet burn and  agitation  . Morphine And Related Other (See Comments)    Feet burn and agitation  . Gabapentin     Difficulty concentrating   Past Medical History:  Diagnosis Date  . Anemia    takes Iron pill daily  . Arthritis   . Carotid artery occlusion   . Fibromyalgia   . Fibromyalgia   . Glaucoma    uses Eye Drops daily  . HOH (hard of hearing)   . Hyperlipidemia    takes Atorvastatin daily  . Hypertension    takes Benazepril daily  . Hypothyroidism    takes Synthroid daily  . Macular degeneration    wet and gets injections in both eyes  . Nocturia   . Peripheral vascular disease ([place])   . Rectal incontinence   . Stroke ([place])   . 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 10/28/2013    Current Outpatient Medications:  .  Acetaminophen (TYLENOL PO), Take 1-2 tablets by mouth every 6 (six) hours as needed (pain/headache)., Disp: , Rfl:  .  amLODipine (NORVASC) 10 MG tablet, Take 1 tablet (10 mg total) by mouth daily., Disp: 90 tablet, Rfl: 3 .  atorvastatin (LIPITOR) 20 MG tablet, Take 1 tablet (20 mg total) by mouth daily at 6 PM., Disp: 90 tablet, Rfl: 3 .  benazepril (LOTENSIN) 20 MG tablet, Take 1 tablet (20 mg total) by mouth daily., Disp: 90 tablet, Rfl: 3 .  Besifloxacin HCl (BESIVANCE) 0.6 % SUSP, Place 1 drop into both eyes See admin instructions. Instill one drop into both  eyes 4 times daily on the day of and the day after eye injections - once a month, Disp: , Rfl:  .  bimatoprost (LUMIGAN) 0.01 % SOLN, Place 1 drop into both eyes at bedtime., Disp: , Rfl:  .  diltiazem (TIAZAC) 180 MG 24 hr capsule, Take 180 mg by mouth daily., Disp: , Rfl:  .  ELIQUIS 2.5 MG TABS tablet, Take 1 tablet (2.5 mg total) by mouth 2 (two) times daily., Disp: 180 tablet, Rfl: 3 .  levothyroxine (SYNTHROID) 75 MCG tablet, Take 1 tablet (75 mcg total) by mouth daily before breakfast., Disp: 90 tablet, Rfl: 1 .  metoprolol succinate (TOPROL-XL) 50 MG 24 hr tablet, Take 50 mg by mouth  daily., Disp: , Rfl:  .  Multiple Vitamin (MULTIVITAMIN WITH MINERALS) TABS tablet, Take 1 tablet by mouth daily., Disp: , Rfl:  .  triamcinolone cream (KENALOG) 0.1 %, Apply 1 application topically 2 (two) times daily as needed (scalp itching)., Disp: , Rfl:   Blood pressure (!) 134/58.  Assessment/ Plan: 86 y.o. female   1. Fecal smearing Recommended probiotic like Culturelle or align.  Keagle exercises reinforced.  2. Transient hypotension I have recommended that she reduce her Norvasc to 5 mg daily.  Continue to monitor blood pressures.  Goal less than 140/90.  3. Fibromyalgia muscle pain Surprisingly had a great response to mesalamine.  Apparently this is a nonsteroidal anti-inflammatory drug which does cause some concern given anticoagulation for atrial fibrillation.  I am going to reach out to our pharmacist to determine how potent of an NSAID this is.  Perhaps she can use this on a very sparing occasion.   Start time: 10:45am End time: 10:58am  Total time spent on patient care (including telephone call/ virtual visit): 17 minutes  [place], [place] [phone]

## 2019-10-15 ENCOUNTER — Other Ambulatory Visit: Payer: Self-pay

## 2019-10-15 ENCOUNTER — Encounter (INDEPENDENT_AMBULATORY_CARE_PROVIDER_SITE_OTHER): Payer: Federal, State, Local not specified - PPO | Admitting: Ophthalmology

## 2019-10-15 DIAGNOSIS — H43813 Vitreous degeneration, bilateral: Secondary | ICD-10-CM | POA: Diagnosis not present

## 2019-10-15 DIAGNOSIS — H353231 Exudative age-related macular degeneration, bilateral, with active choroidal neovascularization: Secondary | ICD-10-CM

## 2019-10-15 DIAGNOSIS — I10 Essential (primary) hypertension: Secondary | ICD-10-CM

## 2019-10-15 DIAGNOSIS — H35033 Hypertensive retinopathy, bilateral: Secondary | ICD-10-CM | POA: Diagnosis not present

## 2019-10-17 NOTE — Progress Notes (Signed)
Pt called and aware - hasn't taken in 1 week.

## 2019-10-30 ENCOUNTER — Telehealth: Payer: Self-pay | Admitting: Family Medicine

## 2019-10-31 NOTE — Telephone Encounter (Signed)
Is she feeling well?  Is she staying hydrated?? If so, I'd like her to hold her Amlodipine for a day or so.  If BP <140/90, can stay off it.  If goes above 140/90, go back on 1/2 tablet of amlodipine.  RN recheck in 1 week

## 2019-10-31 NOTE — Telephone Encounter (Signed)
Patient feels fine and is staying hydrated. She will keep an eye on it and will come in next week for BP check and it is scheduled on 09/02.

## 2019-11-06 ENCOUNTER — Ambulatory Visit: Payer: Medicare Other | Admitting: *Deleted

## 2019-11-06 ENCOUNTER — Other Ambulatory Visit: Payer: Self-pay | Admitting: Family Medicine

## 2019-11-06 ENCOUNTER — Other Ambulatory Visit: Payer: Self-pay

## 2019-11-06 DIAGNOSIS — I10 Essential (primary) hypertension: Secondary | ICD-10-CM

## 2019-11-06 MED ORDER — ELIQUIS 2.5 MG PO TABS: 2.5000 mg | ORAL_TABLET | Freq: Two times a day (BID) | ORAL | 3 refills | Status: DC

## 2019-11-06 NOTE — Progress Notes (Signed)
Pt came in to triage to have BP rechecked. She brought her monitor. Her monitor read 190/85 in left arm, ours read 188/68 in left arm. Manual BP is 182/70. Dr Darnell Level is aware and ordered pt to go back on half of amlodipine daily and recheck in 2 weeks

## 2019-11-06 NOTE — Telephone Encounter (Signed)
  Prescription Request  11/06/2019  What is the name of the medication or equipment? ELIQUIS 2.5 MG TABS tablet  Have you contacted your pharmacy to request a refill? (if applicable) no  Which pharmacy would you like this sent to? [facility]   Patient notified that their request is being sent to the clinical staff for review and that they should receive a response within 2 business days.

## 2019-11-06 NOTE — Telephone Encounter (Signed)
Patient aware rx sent to pharmacy.  

## 2019-11-12 ENCOUNTER — Other Ambulatory Visit: Payer: Self-pay

## 2019-11-12 ENCOUNTER — Ambulatory Visit: Payer: Medicare Other | Admitting: Family Medicine

## 2019-11-12 ENCOUNTER — Encounter (INDEPENDENT_AMBULATORY_CARE_PROVIDER_SITE_OTHER): Payer: Federal, State, Local not specified - PPO | Admitting: Ophthalmology

## 2019-11-12 DIAGNOSIS — H35033 Hypertensive retinopathy, bilateral: Secondary | ICD-10-CM

## 2019-11-12 DIAGNOSIS — H43813 Vitreous degeneration, bilateral: Secondary | ICD-10-CM

## 2019-11-12 DIAGNOSIS — H353231 Exudative age-related macular degeneration, bilateral, with active choroidal neovascularization: Secondary | ICD-10-CM

## 2019-11-12 DIAGNOSIS — I10 Essential (primary) hypertension: Secondary | ICD-10-CM

## 2019-11-14 ENCOUNTER — Ambulatory Visit (INDEPENDENT_AMBULATORY_CARE_PROVIDER_SITE_OTHER): Payer: Federal, State, Local not specified - PPO | Admitting: Family Medicine

## 2019-11-14 ENCOUNTER — Other Ambulatory Visit: Payer: Self-pay

## 2019-11-14 ENCOUNTER — Encounter: Payer: Self-pay | Admitting: Family Medicine

## 2019-11-14 VITALS — BP 160/62 | HR 60 | Temp 98.1°F | Ht 63.0 in | Wt 127.6 lb

## 2019-11-14 DIAGNOSIS — I480 Paroxysmal atrial fibrillation: Secondary | ICD-10-CM

## 2019-11-14 DIAGNOSIS — I10 Essential (primary) hypertension: Secondary | ICD-10-CM

## 2019-11-14 DIAGNOSIS — E034 Atrophy of thyroid (acquired): Secondary | ICD-10-CM | POA: Diagnosis not present

## 2019-11-14 NOTE — Progress Notes (Signed)
Subjective: CC: thyroid PCP: [name], DO Jenna Ramos is a 86 y.o. female presenting to clinic today for:  1. Hypothyroidism Patient reports compliance with Synthroid 75 mcg daily.  No change in weight, bowel habits.  Has not had any runs of tachycardia.  2.  Hypertension/atrial fibrillation Patient reports compliance with beta-blocker, Eliquis, benazepril.  She has not taken diltiazem in some time now.  She is started amlodipine 5 mg a couple of weeks ago due to elevated blood pressures with systolics in the 562Z.  Her blood pressures have been below 150s consistently now.  She does actually have some dips into the systolics of 30Q though.  She does feel wheezy head at this time.  Denies any falls or loss of consciousness.  This is resolved by drinking water and resting for about 5 or 10 minutes.  No hematochezia or melena.  ROS: Per HPI  Allergies  Allergen Reactions  . Aggrenox [Aspirin-Dipyridamole Er] Anaphylaxis    Severe headache  . Codeine Other (See Comments)    Feet burn and agitation  . Morphine And Related Other (See Comments)    Feet burn and agitation  . Gabapentin     Difficulty concentrating   Past Medical History:  Diagnosis Date  . Anemia    takes Iron pill daily  . Arthritis   . Carotid artery occlusion   . Fibromyalgia   . Fibromyalgia   . Glaucoma    uses Eye Drops daily  . HOH (hard of hearing)   . Hyperlipidemia    takes Atorvastatin daily  . Hypertension    takes Benazepril daily  . Hypothyroidism    takes Synthroid daily  . Macular degeneration    wet and gets injections in both eyes  . Nocturia   . Peripheral vascular disease ([place])   . Rectal incontinence   . Stroke ([place])   . 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 10/28/2013    Current Outpatient Medications:  .  Acetaminophen (TYLENOL PO), Take 1-2 tablets by mouth every 6 (six) hours as needed (pain/headache)., Disp: , Rfl:  .  amLODipine  (NORVASC) 10 MG tablet, Take 1 tablet (10 mg total) by mouth daily., Disp: 90 tablet, Rfl: 3 .  atorvastatin (LIPITOR) 20 MG tablet, Take 1 tablet (20 mg total) by mouth daily at 6 PM., Disp: 90 tablet, Rfl: 3 .  benazepril (LOTENSIN) 20 MG tablet, Take 1 tablet (20 mg total) by mouth daily., Disp: 90 tablet, Rfl: 3 .  Besifloxacin HCl (BESIVANCE) 0.6 % SUSP, Place 1 drop into both eyes See admin instructions. Instill one drop into both eyes 4 times daily on the day of and the day after eye injections - once a month, Disp: , Rfl:  .  bimatoprost (LUMIGAN) 0.01 % SOLN, Place 1 drop into both eyes at bedtime., Disp: , Rfl:  .  diltiazem (TIAZAC) 180 MG 24 hr capsule, Take 180 mg by mouth daily., Disp: , Rfl:  .  ELIQUIS 2.5 MG TABS tablet, Take 1 tablet (2.5 mg total) by mouth 2 (two) times daily., Disp: 180 tablet, Rfl: 3 .  levothyroxine (SYNTHROID) 75 MCG tablet, Take 1 tablet (75 mcg total) by mouth daily before breakfast., Disp: 90 tablet, Rfl: 1 .  metoprolol succinate (TOPROL-XL) 50 MG 24 hr tablet, Take 50 mg by mouth daily., Disp: , Rfl:  .  Multiple Vitamin (MULTIVITAMIN WITH MINERALS) TABS tablet, Take 1 tablet by mouth daily., Disp: , Rfl:  .  triamcinolone cream (KENALOG) 0.1 %, Apply 1 application topically 2 (two) times daily as needed (scalp itching)., Disp: , Rfl:  Social History   Socioeconomic History  . Marital status: Widowed    Spouse name: Not on file  . Number of children: Not on file  . Years of education: Not on file  . Highest education level: Not on file  Occupational History  . Not on file  Tobacco Use  . Smoking status: Former Research scientist (life sciences)  . Smokeless tobacco: Never Used  . Tobacco comment: quit smoking 66yrs ago  Vaping Use  . Vaping Use: Never used  Substance and Sexual Activity  . Alcohol use: No    Alcohol/week: 0.0 standard drinks  . Drug use: No  . Sexual activity: Yes    Birth control/protection: Post-menopausal  Other Topics Concern  . Not on file    Social History Narrative  . Not on file   Social Determinants of Health   Financial Resource Strain:   . Difficulty of Paying Living Expenses: Not on file  Food Insecurity:   . Worried About Charity fundraiser in the Last Year: Not on file  . Ran Out of Food in the Last Year: Not on file  Transportation Needs:   . Lack of Transportation (Medical): Not on file  . Lack of Transportation (Non-Medical): Not on file  Physical Activity:   . Days of Exercise per Week: Not on file  . Minutes of Exercise per Session: Not on file  Stress:   . Feeling of Stress : Not on file  Social Connections:   . Frequency of Communication with Friends and Family: Not on file  . Frequency of Social Gatherings with Friends and Family: Not on file  . Attends Religious Services: Not on file  . Active Member of Clubs or Organizations: Not on file  . Attends Archivist Meetings: Not on file  . Marital Status: Not on file  Intimate Partner Violence:   . Fear of Current or Ex-Partner: Not on file  . Emotionally Abused: Not on file  . Physically Abused: Not on file  . Sexually Abused: Not on file   Family History  Problem Relation Age of Onset  . Prostate cancer Father   . Cancer Father   . Lung disease Sister   . Cancer Brother     Objective: Office vital signs reviewed. BP (!) 160/62   Pulse 60   Temp 98.1 F (36.7 C) (Temporal)   Ht 5\' 3"  (1.6 m)   Wt 127 lb 9.6 oz (57.9 kg)   BMI 22.60 kg/m   Physical Examination:  General: Awake, alert, well nourished, No acute distress HEENT: Normal. No conjunctival pallor; no exophthalmos.  No goiter Cardio: seemingly regular rate and rhythm, S1S2 heard, no murmurs appreciated Pulm: clear to auscultation bilaterally, no wheezes, rhonchi or rales; normal work of breathing on room air Extremities: warm, well perfused, No edema, cyanosis or clubbing; +2 pulses bilaterally Skin: Normal temperature Neuro: No tremor  Assessment/ Plan: 86 y.o.  female   1. Essential hypertension Blood pressures continue to be quite labile.  I hesitate to increase her blood pressure medication further given normal blood pressures at home.  She has had a couple of hypotensive episodes which does cause me pause.  I have considered sending her to the hypertensive clinic and we may need to consider doing that if she continues to have intermittent lows with intermittent highs.  We discussed red flag signs and symptoms.  Encouraged her to drink water regularly.  She will continue to keep a close eye on blood pressures and follow-up with me in 3 months, sooner if needed - Basic Metabolic Panel  2. Paroxysmal atrial fibrillation (HCC) Rate and rhythm controlled today  3. Hypothyroidism due to acquired atrophy of thyroid Asymptomatic.  Check thyroid panel - Thyroid Panel With TSH    No orders of the defined types were placed in this encounter.  No orders of the defined types were placed in this encounter.    [name], DO [place] [phone]

## 2019-11-14 NOTE — Patient Instructions (Signed)
Call me with blood pressure readings in 2 weeks  You had labs performed today.  You will be contacted with the results of the labs once they are available, usually in the next 3 business days for routine lab work.  If you have an active my chart account, they will be released to your MyChart.  If you prefer to have these labs released to you via telephone, please let us know.  If you had a pap smear or biopsy performed, expect to be contacted in about 7-10 days.

## 2019-11-15 LAB — BASIC METABOLIC PANEL
BUN/Creatinine Ratio: 24 (ref 12–28)
BUN: 19 mg/dL (ref 8–27)
CO2: 26 mmol/L (ref 20–29)
Calcium: 10.1 mg/dL (ref 8.7–10.3)
Chloride: 101 mmol/L (ref 96–106)
Creatinine, Ser: 0.8 mg/dL (ref 0.57–1.00)
GFR calc Af Amer: 76 mL/min/{1.73_m2} (ref >59)
GFR calc non Af Amer: 66 mL/min/{1.73_m2} (ref >59)
Glucose: 103 mg/dL — ABNORMAL HIGH (ref 65–99)
Potassium: 4.5 mmol/L (ref 3.5–5.2)
Sodium: 141 mmol/L (ref 134–144)

## 2019-11-15 LAB — THYROID PANEL WITH TSH
Free Thyroxine Index: 2.4 (ref 1.2–4.9)
T3 Uptake Ratio: 28 % (ref 24–39)
T4, Total: 8.7 ug/dL (ref 4.5–12.0)
TSH: 0.939 u[IU]/mL (ref 0.450–4.500)

## 2019-11-17 ENCOUNTER — Telehealth: Payer: Self-pay | Admitting: Family Medicine

## 2019-11-17 NOTE — Telephone Encounter (Signed)
Refer to labs  °

## 2019-11-17 NOTE — Telephone Encounter (Signed)
Called patient - she is not taking lisinopril and not on medication list. Please advise

## 2019-11-17 NOTE — Telephone Encounter (Signed)
Double up on lisinopril until Dr. Darnell Level comes back next week. Let DR. Darnell Level know how blood pressure is dong with med increase.

## 2019-11-17 NOTE — Telephone Encounter (Signed)
Patient aware and verbalizes understanding. 

## 2019-11-17 NOTE — Telephone Encounter (Signed)
Lisinopril and benazepril are the samething- increase benazepril to 40mg  daily

## 2019-11-19 ENCOUNTER — Ambulatory Visit: Payer: Medicare Other | Admitting: Family Medicine

## 2019-11-20 ENCOUNTER — Telehealth: Payer: Self-pay | Admitting: Family Medicine

## 2019-11-20 NOTE — Telephone Encounter (Signed)
On 09/13 BP was high and covering pcp told patient to increase benazepril to 40mg  daily. Now it is low and pulse is low scheduled Televisit

## 2019-11-21 ENCOUNTER — Ambulatory Visit (INDEPENDENT_AMBULATORY_CARE_PROVIDER_SITE_OTHER): Payer: Federal, State, Local not specified - PPO | Admitting: Family Medicine

## 2019-11-21 DIAGNOSIS — I10 Essential (primary) hypertension: Secondary | ICD-10-CM | POA: Diagnosis not present

## 2019-11-21 NOTE — Progress Notes (Signed)
Virtual Visit via Telephone Note  I connected with Jenna Ramos on 11/21/19 at 4:59 PM by telephone and verified that I am speaking with the correct person using two identifiers. Jenna Ramos is currently located at home and nobody is currently with her during this visit. The provider, Loman Brooklyn, FNP is located in their office at time of visit.  I discussed the limitations, risks, security and privacy concerns of performing an evaluation and management service by telephone and the availability of in person appointments. I also discussed with the patient that there may be a patient responsible charge related to this service. The patient expressed understanding and agreed to proceed.  Subjective: PCP: Jenna Norlander, DO  Chief Complaint  Patient presents with   Hypertension   Patient reports the following readings of her BP recently and wants to know why there is a fluctuation: 115/51 134/57 146/65   ROS: Per HPI  Current Outpatient Medications:    Acetaminophen (TYLENOL PO), Take 1-2 tablets by mouth every 6 (six) hours as needed (pain/headache)., Disp: , Rfl:    amLODipine (NORVASC) 10 MG tablet, Take 1 tablet (10 mg total) by mouth daily. (Patient taking differently: Take 5 mg by mouth daily. ), Disp: 90 tablet, Rfl: 3   atorvastatin (LIPITOR) 20 MG tablet, Take 1 tablet (20 mg total) by mouth daily at 6 PM., Disp: 90 tablet, Rfl: 3   benazepril (LOTENSIN) 20 MG tablet, Take 1 tablet (20 mg total) by mouth daily., Disp: 90 tablet, Rfl: 3   Besifloxacin HCl (BESIVANCE) 0.6 % SUSP, Place 1 drop into both eyes See admin instructions. Instill one drop into both eyes 4 times daily on the day of and the day after eye injections - once a month, Disp: , Rfl:    bimatoprost (LUMIGAN) 0.01 % SOLN, Place 1 drop into both eyes at bedtime., Disp: , Rfl:    ELIQUIS 2.5 MG TABS tablet, Take 1 tablet (2.5 mg total) by mouth 2 (two) times daily., Disp: 180 tablet,  Rfl: 3   levothyroxine (SYNTHROID) 75 MCG tablet, Take 1 tablet (75 mcg total) by mouth daily before breakfast., Disp: 90 tablet, Rfl: 1   metoprolol succinate (TOPROL-XL) 50 MG 24 hr tablet, Take 50 mg by mouth daily., Disp: , Rfl:    Multiple Vitamin (MULTIVITAMIN WITH MINERALS) TABS tablet, Take 1 tablet by mouth daily., Disp: , Rfl:    triamcinolone cream (KENALOG) 0.1 %, Apply 1 application topically 2 (two) times daily as needed (scalp itching)., Disp: , Rfl:   Allergies  Allergen Reactions   Aggrenox [Aspirin-Dipyridamole Er] Anaphylaxis    Severe headache   Codeine Other (See Comments)    Feet burn and agitation   Morphine And Related Other (See Comments)    Feet burn and agitation   Gabapentin     Difficulty concentrating   Past Medical History:  Diagnosis Date   Anemia    takes Iron pill daily   Arthritis    Carotid artery occlusion    Fibromyalgia    Fibromyalgia    Glaucoma    uses Eye Drops daily   HOH (hard of hearing)    Hyperlipidemia    takes Atorvastatin daily   Hypertension    takes Benazepril daily   Hypothyroidism    takes Synthroid daily   Macular degeneration    wet and gets injections in both eyes   Nocturia    Peripheral vascular disease ([place])    Rectal incontinence  Stroke [facility])    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 10/28/2013    Observations/Objective: A&O  No respiratory distress or wheezing audible over the phone Mood, judgement, and thought processes all WNL   Assessment and Plan: 1. Essential hypertension - Discussed with patient that her BP can fluctuate due to what she has eaten/drank, activity, stress, pain, hydration, illness, etc. Discussed normal BP range and when to seek medical care or hold medication. She will continue monitoring.    Follow Up Instructions:  I discussed the assessment and treatment plan with the patient. The patient was provided an opportunity to ask  questions and all were answered. The patient agreed with the plan and demonstrated an understanding of the instructions.   The patient was advised to call back or seek an in-person evaluation if the symptoms worsen or if the condition fails to improve as anticipated.  The above assessment and management plan was discussed with the patient. The patient verbalized understanding of and has agreed to the management plan. Patient is aware to call the clinic if symptoms persist or worsen. Patient is aware when to return to the clinic for a follow-up visit. Patient educated on when it is appropriate to go to the emergency department.   Time call ended: 5:10 PM  I provided 13 minutes of non-face-to-face time during this encounter.  [name], MSN, APRN, FNP-C [facility] 11/21/19

## 2019-11-23 ENCOUNTER — Encounter: Payer: Self-pay | Admitting: Family Medicine

## 2019-11-24 ENCOUNTER — Telehealth: Payer: Self-pay | Admitting: Family Medicine

## 2019-11-24 NOTE — Telephone Encounter (Signed)
Patient states she has been dizzy x 1 week but she has been fine the last two days. She is concerned she is taking too much bp medication.  Would like to clarify if she should be taking 1 OR 2 of her benazepril?  According to her medication list she is to take one daily but patient states she thought she was told to take 2 daily- Please advise.  Below are BP readings.  9/13- 180/? 9/14- 145/60 p-63, 120/58 p-55 9/15- 140/64 p-61, 115/52 p- 51 9/16- 132/60 p-61, 120/56 9/20- 128/59  Please advise

## 2019-11-24 NOTE — Telephone Encounter (Signed)
Sunday 9/10 163/66 Monday 9/13 183/75 Tuesday 9/14 181/74 Wed 9/15       176/77 Thurs 9/16      146/68 Fri 9/17           158/68 Sat 9/18          14 6/53  Pt says that the reading she gave nurse were incorrect, she was reading off the wrong paper.

## 2019-11-24 NOTE — Telephone Encounter (Signed)
Attempted to call. No answer.  Her BPs are still on the high side.  If anything we may need to increase her medication.  Please verify that she is taking only 5mg  of Amlodipine.  If so, plan to increase to full tablet with follow up with RN for BP recheck in 1 week.

## 2019-11-26 ENCOUNTER — Telehealth: Payer: Self-pay | Admitting: Family Medicine

## 2019-11-26 NOTE — Telephone Encounter (Signed)
Patient aware, follow up scheduled.

## 2019-11-26 NOTE — Telephone Encounter (Signed)
LMTCB

## 2019-11-26 NOTE — Telephone Encounter (Signed)
2nd time leaving message to call back

## 2019-12-03 ENCOUNTER — Other Ambulatory Visit: Payer: Self-pay | Admitting: Family Medicine

## 2019-12-04 ENCOUNTER — Ambulatory Visit: Payer: Medicare Other | Admitting: *Deleted

## 2019-12-04 ENCOUNTER — Other Ambulatory Visit: Payer: Self-pay

## 2019-12-04 DIAGNOSIS — I10 Essential (primary) hypertension: Secondary | ICD-10-CM

## 2019-12-04 NOTE — Progress Notes (Signed)
Pt blood pressure was 146/58 manually

## 2019-12-17 ENCOUNTER — Encounter (INDEPENDENT_AMBULATORY_CARE_PROVIDER_SITE_OTHER): Payer: Federal, State, Local not specified - PPO | Admitting: Ophthalmology

## 2019-12-17 ENCOUNTER — Other Ambulatory Visit: Payer: Self-pay

## 2019-12-17 DIAGNOSIS — I10 Essential (primary) hypertension: Secondary | ICD-10-CM

## 2019-12-17 DIAGNOSIS — H43813 Vitreous degeneration, bilateral: Secondary | ICD-10-CM | POA: Diagnosis not present

## 2019-12-17 DIAGNOSIS — H353231 Exudative age-related macular degeneration, bilateral, with active choroidal neovascularization: Secondary | ICD-10-CM | POA: Diagnosis not present

## 2019-12-17 DIAGNOSIS — H35033 Hypertensive retinopathy, bilateral: Secondary | ICD-10-CM | POA: Diagnosis not present

## 2019-12-18 ENCOUNTER — Telehealth: Payer: Self-pay

## 2019-12-18 ENCOUNTER — Other Ambulatory Visit: Payer: Self-pay | Admitting: Family Medicine

## 2019-12-18 NOTE — Telephone Encounter (Signed)
Already sent to Dr. Lajuana Ripple

## 2019-12-18 NOTE — Telephone Encounter (Signed)
  Prescription Request  12/18/2019  What is the name of the medication or equipment? metoprolol succinate (TOPROL-XL) 50 MG 24 hr tablet   Have you contacted your pharmacy to request a refill? (if applicable) yes  Which pharmacy would you like this sent to? [facility]    Patient notified that their request is being sent to the clinical staff for review and that they should receive a response within 2 business days.

## 2020-01-21 ENCOUNTER — Encounter (INDEPENDENT_AMBULATORY_CARE_PROVIDER_SITE_OTHER): Payer: Federal, State, Local not specified - PPO | Admitting: Ophthalmology

## 2020-01-21 ENCOUNTER — Other Ambulatory Visit: Payer: Self-pay

## 2020-01-21 DIAGNOSIS — H353231 Exudative age-related macular degeneration, bilateral, with active choroidal neovascularization: Secondary | ICD-10-CM

## 2020-01-21 DIAGNOSIS — H43813 Vitreous degeneration, bilateral: Secondary | ICD-10-CM | POA: Diagnosis not present

## 2020-01-21 DIAGNOSIS — H35033 Hypertensive retinopathy, bilateral: Secondary | ICD-10-CM | POA: Diagnosis not present

## 2020-01-21 DIAGNOSIS — I10 Essential (primary) hypertension: Secondary | ICD-10-CM

## 2020-02-16 ENCOUNTER — Other Ambulatory Visit: Payer: Self-pay

## 2020-02-16 ENCOUNTER — Ambulatory Visit (INDEPENDENT_AMBULATORY_CARE_PROVIDER_SITE_OTHER): Payer: Federal, State, Local not specified - PPO | Admitting: Family Medicine

## 2020-02-16 VITALS — BP 139/67 | HR 61 | Temp 97.0°F | Ht 63.0 in | Wt 132.0 lb

## 2020-02-16 DIAGNOSIS — E034 Atrophy of thyroid (acquired): Secondary | ICD-10-CM

## 2020-02-16 DIAGNOSIS — I480 Paroxysmal atrial fibrillation: Secondary | ICD-10-CM | POA: Diagnosis not present

## 2020-02-16 DIAGNOSIS — Z23 Encounter for immunization: Secondary | ICD-10-CM | POA: Diagnosis not present

## 2020-02-16 DIAGNOSIS — H3530 Unspecified macular degeneration: Secondary | ICD-10-CM | POA: Diagnosis not present

## 2020-02-16 DIAGNOSIS — I10 Essential (primary) hypertension: Secondary | ICD-10-CM | POA: Diagnosis not present

## 2020-02-16 MED ORDER — METOPROLOL SUCCINATE ER 50 MG PO TB24: 50.0000 mg | ORAL_TABLET | Freq: Every day | ORAL | 3 refills | Status: DC

## 2020-02-16 NOTE — Patient Instructions (Signed)
No labs needed today.  See me in 4 months and we will plan for labs that visit.

## 2020-02-16 NOTE — Progress Notes (Signed)
Subjective: CC: Follow-up A. fib, hypertension, hyperlipidemia PCP: [name], DO Jenna Ramos is a 86 y.o. female presenting to clinic today for:  1.  A. fib, hypertension, hyperlipidemia Patient reports compliance with Norvasc 10 mg daily, Lotensin 20 mg daily, metoprolol 50 mg daily and Lipitor 20 mg daily.  She is also treated with low-dose Eliquis.  No reports of chest pain, shortness of breath, hematochezia or melena  2.  Hypothyroidism Compliant with Synthroid 75 mcg daily.  Last thyroid panel was within normal range.  No reports of palpitations, tremor or change in bowel habit  3.  Macular degeneration Overall she remarks that she feels extremely well and has been doing well.  She has some shot scheduled next week for her known macular degeneration which is very closely monitored by her ophthalmologist   ROS: Per HPI  Allergies  Allergen Reactions  . Aggrenox [Aspirin-Dipyridamole Er] Anaphylaxis    Severe headache  . Codeine Other (See Comments)    Feet burn and agitation  . Morphine And Related Other (See Comments)    Feet burn and agitation  . Gabapentin     Difficulty concentrating   Past Medical History:  Diagnosis Date  . Anemia    takes Iron pill daily  . Arthritis   . Carotid artery occlusion   . Fibromyalgia   . Fibromyalgia   . Glaucoma    uses Eye Drops daily  . HOH (hard of hearing)   . Hyperlipidemia    takes Atorvastatin daily  . Hypertension    takes Benazepril daily  . Hypothyroidism    takes Synthroid daily  . Macular degeneration    wet and gets injections in both eyes  . Nocturia   . Peripheral vascular disease ([place])   . Rectal incontinence   . Stroke ([place])   . 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 10/28/2013    Current Outpatient Medications:  .  Acetaminophen (TYLENOL PO), Take 1-2 tablets by mouth every 6 (six) hours as needed (pain/headache)., Disp: , Rfl:  .  amLODipine (NORVASC) 10 MG  tablet, Take 1 tablet (10 mg total) by mouth daily. (Patient taking differently: Take 5 mg by mouth daily.), Disp: 90 tablet, Rfl: 3 .  atorvastatin (LIPITOR) 20 MG tablet, TAKE 1 TABLET (20 MG TOTAL) BY MOUTH DAILY AT 6 PM., Disp: 90 tablet, Rfl: 3 .  benazepril (LOTENSIN) 20 MG tablet, Take 1 tablet (20 mg total) by mouth daily., Disp: 90 tablet, Rfl: 3 .  Besifloxacin HCl 0.6 % SUSP, Place 1 drop into both eyes See admin instructions. Instill one drop into both eyes 4 times daily on the day of and the day after eye injections - once a month, Disp: , Rfl:  .  bimatoprost (LUMIGAN) 0.01 % SOLN, Place 1 drop into both eyes at bedtime., Disp: , Rfl:  .  ELIQUIS 2.5 MG TABS tablet, Take 1 tablet (2.5 mg total) by mouth 2 (two) times daily., Disp: 180 tablet, Rfl: 3 .  levothyroxine (SYNTHROID) 75 MCG tablet, TAKE 1 TABLET (75 MCG TOTAL) BY MOUTH DAILY BEFORE BREAKFAST., Disp: 90 tablet, Rfl: 3 .  metoprolol succinate (TOPROL-XL) 50 MG 24 hr tablet, Take 50 mg by mouth daily., Disp: , Rfl:  .  Multiple Vitamin (MULTIVITAMIN WITH MINERALS) TABS tablet, Take 1 tablet by mouth daily., Disp: , Rfl:  .  triamcinolone cream (KENALOG) 0.1 %, Apply 1 application topically 2 (two) times daily as needed (scalp itching)., Disp: , Rfl:  Social History   Socioeconomic History  . Marital status: Widowed    Spouse name: Not on file  . Number of children: Not on file  . Years of education: Not on file  . Highest education level: Not on file  Occupational History  . Not on file  Tobacco Use  . Smoking status: Former Research scientist (life sciences)  . Smokeless tobacco: Never Used  . Tobacco comment: quit smoking 28yrs ago  Vaping Use  . Vaping Use: Never used  Substance and Sexual Activity  . Alcohol use: No    Alcohol/week: 0.0 standard drinks  . Drug use: No  . Sexual activity: Yes    Birth control/protection: Post-menopausal  Other Topics Concern  . Not on file  Social History Narrative  . Not on file   Social  Determinants of Health   Financial Resource Strain: Not on file  Food Insecurity: Not on file  Transportation Needs: Not on file  Physical Activity: Not on file  Stress: Not on file  Social Connections: Not on file  Intimate Partner Violence: Not on file   Family History  Problem Relation Age of Onset  . Prostate cancer Father   . Cancer Father   . Lung disease Sister   . Cancer Brother     Objective: Office vital signs reviewed. BP 139/67   Pulse 61   Temp (!) 97 F (36.1 C) (Temporal)   Ht 5\' 3"  (1.6 m)   Wt 132 lb (59.9 kg)   SpO2 98%   BMI 23.38 kg/m   Physical Examination:  General: Awake, alert, No acute distress HEENT: Normal, good light reflex Cardio: regular rate and rhythm, S1S2 heard, no murmurs appreciated Pulm: clear to auscultation bilaterally, no wheezes, rhonchi or rales; normal work of breathing on room air Extremities: warm, well perfused, No edema, cyanosis or clubbing; +2 pulses bilaterally Neuro: No tremor; hard of hearing  Assessment/ Plan: 86 y.o. female   Paroxysmal atrial fibrillation (HCC)  Essential hypertension  Macular degeneration of both eyes, unspecified type  Hypothyroidism due to acquired atrophy of thyroid  Need for immunization against influenza - Plan: Flu Vaccine QUAD High Dose(Fluad)  She is rate controlled.  No red flags.  Blood pressure is good.  Metoprolol renewed  Ophthalmologic shot scheduled next week  Asymptomatic from a thyroid standpoint.  No need for repeat labs given stability  Her influenza vaccination was administered  Orders Placed This Encounter  Procedures  . Flu Vaccine QUAD High Dose(Fluad)   No orders of the defined types were placed in this encounter.    [name], DO [place] [phone]

## 2020-02-18 ENCOUNTER — Other Ambulatory Visit: Payer: Self-pay

## 2020-02-18 ENCOUNTER — Encounter (INDEPENDENT_AMBULATORY_CARE_PROVIDER_SITE_OTHER): Payer: Federal, State, Local not specified - PPO | Admitting: Ophthalmology

## 2020-02-18 DIAGNOSIS — H26492 Other secondary cataract, left eye: Secondary | ICD-10-CM | POA: Diagnosis not present

## 2020-02-25 ENCOUNTER — Other Ambulatory Visit: Payer: Self-pay

## 2020-02-25 ENCOUNTER — Encounter (INDEPENDENT_AMBULATORY_CARE_PROVIDER_SITE_OTHER): Payer: Federal, State, Local not specified - PPO | Admitting: Ophthalmology

## 2020-02-25 DIAGNOSIS — H353231 Exudative age-related macular degeneration, bilateral, with active choroidal neovascularization: Secondary | ICD-10-CM | POA: Diagnosis not present

## 2020-02-25 DIAGNOSIS — I10 Essential (primary) hypertension: Secondary | ICD-10-CM | POA: Diagnosis not present

## 2020-02-25 DIAGNOSIS — H35033 Hypertensive retinopathy, bilateral: Secondary | ICD-10-CM

## 2020-02-25 DIAGNOSIS — H43813 Vitreous degeneration, bilateral: Secondary | ICD-10-CM

## 2020-03-24 ENCOUNTER — Telehealth: Payer: Self-pay

## 2020-03-24 ENCOUNTER — Encounter (INDEPENDENT_AMBULATORY_CARE_PROVIDER_SITE_OTHER): Payer: Medicare Other | Admitting: Ophthalmology

## 2020-03-24 NOTE — Telephone Encounter (Signed)
Appointment scheduled.

## 2020-03-25 ENCOUNTER — Encounter: Payer: Self-pay | Admitting: Family Medicine

## 2020-03-25 ENCOUNTER — Ambulatory Visit (INDEPENDENT_AMBULATORY_CARE_PROVIDER_SITE_OTHER): Payer: Federal, State, Local not specified - PPO | Admitting: Family Medicine

## 2020-03-25 DIAGNOSIS — J069 Acute upper respiratory infection, unspecified: Secondary | ICD-10-CM

## 2020-03-25 LAB — VERITOR FLU A/B WAIVED
Influenza A: NEGATIVE
Influenza B: NEGATIVE

## 2020-03-25 NOTE — Progress Notes (Signed)
Subjective:    Patient ID: Jenna Ramos, female    DOB: Jan 26, 1929, 87 y.o.   MRN: [id number]   HPI: Jenna Ramos is a 87 y.o. female presenting for  Deep cough. Nose is not plugged up. Lot of rhinorrhea. Cough is dry. Onset was  3 days ago. Slight fever. Started feeling worse yesterday. Voice is hoarse. Denies dyspnea, but chest is tight.    Depression screen [facility] 2/9 02/16/2020 11/14/2019 07/24/2019 07/07/2019 05/19/2019  Decreased Interest 0 0 0 0 0  Down, Depressed, Hopeless 0 0 0 0 0  PHQ - 2 Score 0 0 0 0 0  Altered sleeping 0 - - - -  Tired, decreased energy 0 - - - -  Change in appetite 0 - - - -  Feeling bad or failure about yourself  0 - - - -  Trouble concentrating 0 - - - -  Moving slowly or fidgety/restless 0 - - - -  Suicidal thoughts 0 - - - -  PHQ-9 Score 0 - - - -  Difficult doing work/chores - - - - -  Some recent data might be hidden     Relevant past medical, surgical, family and social history reviewed and updated as indicated.  Interim medical history since our last visit reviewed. Allergies and medications reviewed and updated.  ROS:  Review of Systems  Constitutional: Negative for appetite change, chills, diaphoresis and fever.  HENT: Positive for congestion, rhinorrhea and voice change. Negative for ear pain, postnasal drip and trouble swallowing.   Respiratory: Positive for cough and chest tightness. Negative for shortness of breath.   Cardiovascular: Negative for chest pain.  Gastrointestinal: Negative for abdominal pain.  Musculoskeletal: Negative for arthralgias.  Skin: Negative for rash.     Social History   Tobacco Use  Smoking Status Former Smoker  Smokeless Tobacco Never Used  Tobacco Comment   quit smoking 69yrs ago       Objective:     Wt Readings from Last 3 Encounters:  02/16/20 132 lb (59.9 kg)  11/14/19 127 lb 9.6 oz (57.9 kg)  07/24/19 124 lb (56.2 kg)     Exam deferred. Pt. Harboring due to COVID 19.  Phone visit performed.   Assessment & Plan:   1. Viral URI     No orders of the defined types were placed in this encounter.   Orders Placed This Encounter  Procedures  . Novel Coronavirus, NAA ([facility])    Order Specific Question:   Is this test for diagnosis or screening    Answer:   Diagnosis of ill patient    Order Specific Question:   Symptomatic for COVID-19 as defined by CDC    Answer:   Yes    Order Specific Question:   Date of Symptom Onset    Answer:   03/22/2020    Order Specific Question:   Hospitalized for COVID-19    Answer:   No    Order Specific Question:   Admitted to ICU for COVID-19    Answer:   No    Order Specific Question:   Previously tested for COVID-19    Answer:   No    Order Specific Question:   Resident in a congregate (group) care setting    Answer:   No    Order Specific Question:   Is the patient student?    Answer:   No    Order Specific Question:   Employed in healthcare setting  Answer:   No    Order Specific Question:   Pregnant    Answer:   No    Order Specific Question:   Has patient completed COVID vaccination(s) (2 doses of Pfizer/Moderna 1 dose of [employer])    Answer:   Yes  . Veritor Flu A/B Waived    Order Specific Question:   Source    Answer:   nasal swab    Order Specific Question:   Release to patient    Answer:   Immediate      Diagnoses and all orders for this visit:  Viral URI -     Novel Coronavirus, NAA ([facility]) -     Veritor Flu A/B Waived   Reviewewd OTC meds. Stay hdrated. Monitor for dysnea Virtual Visit via telephone Note  I discussed the limitations, risks, security and privacy concerns of performing an evaluation and management service by telephone and the availability of in person appointments. The patient was identified with two identifiers. Pt.expressed understanding and agreed to proceed. Pt. Is at home. Dr. Livia Snellen is in his office.  Follow Up Instructions:   I discussed the assessment  and treatment plan with the patient. The patient was provided an opportunity to ask questions and all were answered. The patient agreed with the plan and demonstrated an understanding of the instructions.   The patient was advised to call back or seek an in-person evaluation if the symptoms worsen or if the condition fails to improve as anticipated.   Total minutes including chart review and phone contact time: 12   Follow up plan: Return if symptoms worsen or fail to improve.  Claretta Fraise, MD [place]

## 2020-03-26 ENCOUNTER — Encounter (INDEPENDENT_AMBULATORY_CARE_PROVIDER_SITE_OTHER): Payer: Medicare Other | Admitting: Ophthalmology

## 2020-03-27 LAB — SARS-COV-2, NAA 2 DAY TAT

## 2020-03-27 LAB — NOVEL CORONAVIRUS, NAA: SARS-CoV-2, NAA: DETECTED — AB

## 2020-03-31 ENCOUNTER — Encounter (INDEPENDENT_AMBULATORY_CARE_PROVIDER_SITE_OTHER): Payer: Medicare Other | Admitting: Ophthalmology

## 2020-04-07 ENCOUNTER — Encounter (INDEPENDENT_AMBULATORY_CARE_PROVIDER_SITE_OTHER): Payer: Federal, State, Local not specified - PPO | Admitting: Ophthalmology

## 2020-04-07 ENCOUNTER — Other Ambulatory Visit: Payer: Self-pay

## 2020-04-07 DIAGNOSIS — I10 Essential (primary) hypertension: Secondary | ICD-10-CM | POA: Diagnosis not present

## 2020-04-07 DIAGNOSIS — H43813 Vitreous degeneration, bilateral: Secondary | ICD-10-CM

## 2020-04-07 DIAGNOSIS — H35033 Hypertensive retinopathy, bilateral: Secondary | ICD-10-CM | POA: Diagnosis not present

## 2020-04-07 DIAGNOSIS — H353231 Exudative age-related macular degeneration, bilateral, with active choroidal neovascularization: Secondary | ICD-10-CM

## 2020-04-16 ENCOUNTER — Encounter: Payer: Self-pay | Admitting: *Deleted

## 2020-05-05 ENCOUNTER — Encounter (INDEPENDENT_AMBULATORY_CARE_PROVIDER_SITE_OTHER): Payer: Federal, State, Local not specified - PPO | Admitting: Ophthalmology

## 2020-05-05 ENCOUNTER — Other Ambulatory Visit: Payer: Self-pay

## 2020-05-05 DIAGNOSIS — H43813 Vitreous degeneration, bilateral: Secondary | ICD-10-CM | POA: Diagnosis not present

## 2020-05-05 DIAGNOSIS — I10 Essential (primary) hypertension: Secondary | ICD-10-CM

## 2020-05-05 DIAGNOSIS — H353231 Exudative age-related macular degeneration, bilateral, with active choroidal neovascularization: Secondary | ICD-10-CM

## 2020-05-05 DIAGNOSIS — H35033 Hypertensive retinopathy, bilateral: Secondary | ICD-10-CM

## 2020-05-14 ENCOUNTER — Other Ambulatory Visit: Payer: Self-pay | Admitting: *Deleted

## 2020-05-14 DIAGNOSIS — I6529 Occlusion and stenosis of unspecified carotid artery: Secondary | ICD-10-CM

## 2020-05-18 ENCOUNTER — Ambulatory Visit: Payer: Federal, State, Local not specified - PPO | Admitting: Vascular Surgery

## 2020-05-18 ENCOUNTER — Other Ambulatory Visit: Payer: Self-pay

## 2020-05-18 ENCOUNTER — Ambulatory Visit (HOSPITAL_COMMUNITY)
Admission: RE | Admit: 2020-05-18 | Discharge: 2020-05-18 | Disposition: A | Discharge status: Home / Self Care | Payer: Federal, State, Local not specified - PPO | Source: Ambulatory Visit | Attending: Vascular Surgery | Admitting: Vascular Surgery

## 2020-05-18 ENCOUNTER — Encounter: Payer: Self-pay | Admitting: Vascular Surgery

## 2020-05-18 VITALS — BP 150/65 | HR 57 | Temp 98.6°F | Resp 20 | Ht 63.0 in | Wt 131.0 lb

## 2020-05-18 DIAGNOSIS — I6529 Occlusion and stenosis of unspecified carotid artery: Secondary | ICD-10-CM | POA: Insufficient documentation

## 2020-05-18 DIAGNOSIS — I6523 Occlusion and stenosis of bilateral carotid arteries: Secondary | ICD-10-CM

## 2020-05-18 NOTE — Progress Notes (Signed)
Patient name: Jenna Ramos MRN: [id number] DOB: 1928-11-16 Sex: female  REASON FOR CONSULT: Carotid artery surveillance  HPI: Jenna Ramos is a 87 y.o. female, with multiple medical problems as noted below that presents for 2-year follow-up for surveillance of her carotid artery disease.  She previously underwent a left carotid endarterectomy in 2015 by Dr. Bridgett Larsson for symptomatic stenosis.  She reports no issues since last follow-up including no numbness or weakness in her arm or leg, no vision loss, no other neurologic symptoms.  Denies any recent history of TIA or CVA.  She states she cannot tolerate an aspirin.  She has had no other neck surgery aside from her left CCEA.  Still living independently and does not smoke.  No immediate concerns today.  Past Medical History:  Diagnosis Date  . Anemia    takes Iron pill daily  . Arthritis   . Carotid artery occlusion   . Fibromyalgia   . Fibromyalgia   . Glaucoma    uses Eye Drops daily  . HOH (hard of hearing)   . Hyperlipidemia    takes Atorvastatin daily  . Hypertension    takes Benazepril daily  . Hypothyroidism    takes Synthroid daily  . Macular degeneration    wet and gets injections in both eyes  . Nocturia   . Peripheral vascular disease ([place])   . Rectal incontinence   . Stroke ([place])   . 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 APPENDECTOMY    . CAROTID ENDARTERECTOMY    . cataract surgery Bilateral   . COLONOSCOPY    . ENDARTERECTOMY Left 02/19/2014   Procedure: ENDARTERECTOMY CAROTID;  Surgeon: Conrad Faulk, MD;  Location: [facility];  Service: Vascular;  Laterality: Left;  . ESOPHAGOGASTRODUODENOSCOPY    . TONSILLECTOMY    . TOTAL HIP ARTHROPLASTY Left 10/2013  . TUBAL LIGATION    . WRIST SURGERY     pins and screws    Family History  Problem Relation Age of Onset  . Prostate cancer Father   . Cancer Father   .  Lung disease Sister   . Cancer Brother     SOCIAL HISTORY: Social History   Socioeconomic History  . Marital status: Widowed    Spouse name: Not on file  . Number of children: Not on file  . Years of education: Not on file  . Highest education level: Not on file  Occupational History  . Not on file  Tobacco Use  . Smoking status: Former Research scientist (life sciences)  . Smokeless tobacco: Never Used  . Tobacco comment: quit smoking 82yrs ago  Vaping Use  . Vaping Use: Never used  Substance and Sexual Activity  . Alcohol use: No    Alcohol/week: 0.0 standard drinks  . Drug use: No  . Sexual activity: Yes    Birth control/protection: Post-menopausal  Other Topics Concern  . Not on file  Social History Narrative  . Not on file   Social Determinants of Health   Financial Resource Strain: Not on file  Food Insecurity: Not on file  Transportation Needs: Not on file  Physical Activity: Not on file  Stress: Not on file  Social Connections: Not on file  Intimate Partner Violence: Not on file    Allergies  Allergen Reactions  . Aggrenox [Aspirin-Dipyridamole Er] Anaphylaxis    Severe headache  . Codeine Other (See Comments)  Feet burn and agitation  . Morphine And Related Other (See Comments)    Feet burn and agitation  . Gabapentin     Difficulty concentrating    Current Outpatient Medications  Medication Sig Dispense Refill  . Acetaminophen (TYLENOL PO) Take 1-2 tablets by mouth every 6 (six) hours as needed (pain/headache).    Marland Kitchen amLODipine (NORVASC) 10 MG tablet Take 1 tablet (10 mg total) by mouth daily. (Patient taking differently: Take 5 mg by mouth daily.) 90 tablet 3  . atorvastatin (LIPITOR) 20 MG tablet TAKE 1 TABLET (20 MG TOTAL) BY MOUTH DAILY AT 6 PM. 90 tablet 3  . benazepril (LOTENSIN) 20 MG tablet Take 1 tablet (20 mg total) by mouth daily. 90 tablet 3  . Besifloxacin HCl 0.6 % SUSP Place 1 drop into both eyes See admin instructions. Instill one drop into both eyes 4  times daily on the day of and the day after eye injections - once a month    . bimatoprost (LUMIGAN) 0.01 % SOLN Place 1 drop into both eyes at bedtime.    Marland Kitchen ELIQUIS 2.5 MG TABS tablet Take 1 tablet (2.5 mg total) by mouth 2 (two) times daily. 180 tablet 3  . levothyroxine (SYNTHROID) 75 MCG tablet TAKE 1 TABLET (75 MCG TOTAL) BY MOUTH DAILY BEFORE BREAKFAST. 90 tablet 3  . metoprolol succinate (TOPROL-XL) 50 MG 24 hr tablet Take 1 tablet (50 mg total) by mouth daily. 90 tablet 3  . Multiple Vitamin (MULTIVITAMIN WITH MINERALS) TABS tablet Take 1 tablet by mouth daily.    Marland Kitchen triamcinolone cream (KENALOG) 0.1 % Apply 1 application topically 2 (two) times daily as needed (scalp itching).     No current facility-administered medications for this visit.    REVIEW OF SYSTEMS:  [X]  denotes positive finding, [ ]  denotes negative finding Cardiac  Comments:  Chest pain or chest pressure:    Shortness of breath upon exertion:    Short of breath when lying flat:    Irregular heart rhythm:        Vascular    Pain in calf, thigh, or hip brought on by ambulation:    Pain in feet at night that wakes you up from your sleep:     Blood clot in your veins:    Leg swelling:         Pulmonary    Oxygen at home:    Productive cough:     Wheezing:         Neurologic    Sudden weakness in arms or legs:     Sudden numbness in arms or legs:     Sudden onset of difficulty speaking or slurred speech:    Temporary loss of vision in one eye:     Problems with dizziness:         Gastrointestinal    Blood in stool:     Vomited blood:         Genitourinary    Burning when urinating:     Blood in urine:        Psychiatric    Major depression:         Hematologic    Bleeding problems:    Problems with blood clotting too easily:        Skin    Rashes or ulcers:        Constitutional    Fever or chills:      PHYSICAL EXAM: Vitals:   05/18/20 1202 05/18/20 1204  BP: (!) 151/66 (!) 150/65   Pulse: (!) 57   Resp: 20   Temp: 98.6 F (37 C)   TempSrc: Temporal   SpO2: 99%   Weight: 131 lb (59.4 kg)   Height: 5\' 3"  (1.6 m)     GENERAL: The patient is a well-nourished female, in no acute distress. The vital signs are documented above. CARDIAC: There is a regular rate and rhythm.  VASCULAR: Well healed left neck incision PULMONARY: No respiratory distress. ABDOMEN: Soft and non-tender. MUSCULOSKELETAL: There are no major deformities or cyanosis. NEUROLOGIC: No focal weakness or paresthesias are detected.  CN II-XII grossly intact SKIN: There are no ulcers or rashes noted. PSYCHIATRIC: The patient has a normal affect.  DATA:   Carotid artery duplex today shows minimal 1 to 39% stenosis bilaterally  Assessment/Plan:  87 year old female presents for 2-year follow-up for surveillance of her carotid artery disease.  As noted above she has previously undergone a left carotid endarterectomy in 2015 by Dr. Bridgett Larsson for symptomatic >70% stenosis.  She basically has had no issues over the last several years since we last saw her.  Her carotid duplex today shows minimal 1 to 39% stenosis bilaterally.  Discussed that in the setting of asymptomatic disease there is no indication for surgical intervention unless greater than 80% stenosis  We will plan to see her again in 2 years with carotid duplex here in the office for suerveillance.  Advised she call with questions or concerns.   Marty Heck, MD [facility] Office: [phone]

## 2020-06-02 ENCOUNTER — Encounter (INDEPENDENT_AMBULATORY_CARE_PROVIDER_SITE_OTHER): Payer: Federal, State, Local not specified - PPO | Admitting: Ophthalmology

## 2020-06-02 ENCOUNTER — Other Ambulatory Visit: Payer: Self-pay

## 2020-06-02 DIAGNOSIS — I10 Essential (primary) hypertension: Secondary | ICD-10-CM

## 2020-06-02 DIAGNOSIS — H353231 Exudative age-related macular degeneration, bilateral, with active choroidal neovascularization: Secondary | ICD-10-CM | POA: Diagnosis not present

## 2020-06-02 DIAGNOSIS — H35033 Hypertensive retinopathy, bilateral: Secondary | ICD-10-CM

## 2020-06-02 DIAGNOSIS — H43813 Vitreous degeneration, bilateral: Secondary | ICD-10-CM | POA: Diagnosis not present

## 2020-06-16 ENCOUNTER — Encounter: Payer: Self-pay | Admitting: Family Medicine

## 2020-06-16 ENCOUNTER — Ambulatory Visit: Payer: Federal, State, Local not specified - PPO | Admitting: Family Medicine

## 2020-06-16 ENCOUNTER — Other Ambulatory Visit: Payer: Self-pay

## 2020-06-16 ENCOUNTER — Telehealth: Payer: Self-pay | Admitting: Family Medicine

## 2020-06-16 ENCOUNTER — Ambulatory Visit (INDEPENDENT_AMBULATORY_CARE_PROVIDER_SITE_OTHER): Payer: Federal, State, Local not specified - PPO

## 2020-06-16 VITALS — BP 128/50 | HR 58 | Temp 97.6°F | Ht 63.0 in | Wt 131.4 lb

## 2020-06-16 DIAGNOSIS — E034 Atrophy of thyroid (acquired): Secondary | ICD-10-CM | POA: Diagnosis not present

## 2020-06-16 DIAGNOSIS — I480 Paroxysmal atrial fibrillation: Secondary | ICD-10-CM

## 2020-06-16 DIAGNOSIS — M533 Sacrococcygeal disorders, not elsewhere classified: Secondary | ICD-10-CM

## 2020-06-16 LAB — HEMOGLOBIN, FINGERSTICK: Hemoglobin: 11.6 g/dL (ref 11.1–15.9)

## 2020-06-16 MED ORDER — PREDNISONE 10 MG (21) PO TBPK: ORAL_TABLET | ORAL | 0 refills | Status: DC

## 2020-06-16 NOTE — Progress Notes (Signed)
Pt returning call

## 2020-06-16 NOTE — Progress Notes (Signed)
Subjective: CC: thyroid, afib PCP: [name], DO DUK:GURKYHC N Tawil is a 87 y.o. female presenting to clinic today for:  1. Hypothyroidism Patient is compliant with her Synthroid 75 mcg daily.  No reports of tremor, heart palpitation, change in voice or bowel habit.  2. Back pain She reports that she is had a week of low back/sacral pain.  It was more severe at onset and is slightly better now but has not resolved.  She is used an OTC analgesic intermittently.  Not sure if this was NSAID versus Tylenol.  The pain seems to wax and wane in severity.  No preceding injury.  She had been gardening a little bit more and she wonders if this perhaps caused the symptoms.  No reports of lower extremity weakness, sensory changes.  3.  Atrial fibrillation Patient is compliant with Eliquis, metoprolol.  No bleeding.  No heart palpitations.  She had a cut on her left forearm but that has healed   ROS: Per HPI  Allergies  Allergen Reactions  . Aggrenox [Aspirin-Dipyridamole Er] Anaphylaxis    Severe headache  . Codeine Other (See Comments)    Feet burn and agitation  . Morphine And Related Other (See Comments)    Feet burn and agitation  . Gabapentin     Difficulty concentrating   Past Medical History:  Diagnosis Date  . Anemia    takes Iron pill daily  . Arthritis   . Carotid artery occlusion   . Fibromyalgia   . Fibromyalgia   . Glaucoma    uses Eye Drops daily  . HOH (hard of hearing)   . Hyperlipidemia    takes Atorvastatin daily  . Hypertension    takes Benazepril daily  . Hypothyroidism    takes Synthroid daily  . Macular degeneration    wet and gets injections in both eyes  . Nocturia   . Peripheral vascular disease ([place])   . Rectal incontinence   . Stroke ([place])   . 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 10/28/2013    Current Outpatient Medications:  .  Acetaminophen (TYLENOL PO), Take 1-2 tablets by mouth every 6 (six) hours as  needed (pain/headache)., Disp: , Rfl:  .  amLODipine (NORVASC) 10 MG tablet, Take 1 tablet (10 mg total) by mouth daily. (Patient taking differently: Take 5 mg by mouth daily.), Disp: 90 tablet, Rfl: 3 .  atorvastatin (LIPITOR) 20 MG tablet, TAKE 1 TABLET (20 MG TOTAL) BY MOUTH DAILY AT 6 PM., Disp: 90 tablet, Rfl: 3 .  benazepril (LOTENSIN) 20 MG tablet, Take 1 tablet (20 mg total) by mouth daily., Disp: 90 tablet, Rfl: 3 .  Besifloxacin HCl 0.6 % SUSP, Place 1 drop into both eyes See admin instructions. Instill one drop into both eyes 4 times daily on the day of and the day after eye injections - once a month, Disp: , Rfl:  .  bimatoprost (LUMIGAN) 0.01 % SOLN, Place 1 drop into both eyes at bedtime., Disp: , Rfl:  .  ELIQUIS 2.5 MG TABS tablet, Take 1 tablet (2.5 mg total) by mouth 2 (two) times daily., Disp: 180 tablet, Rfl: 3 .  levothyroxine (SYNTHROID) 75 MCG tablet, TAKE 1 TABLET (75 MCG TOTAL) BY MOUTH DAILY BEFORE BREAKFAST., Disp: 90 tablet, Rfl: 3 .  metoprolol succinate (TOPROL-XL) 50 MG 24 hr tablet, Take 1 tablet (50 mg total) by mouth daily., Disp: 90 tablet, Rfl: 3 .  Multiple Vitamin (MULTIVITAMIN WITH MINERALS) TABS tablet, Take  1 tablet by mouth daily., Disp: , Rfl:  .  triamcinolone cream (KENALOG) 0.1 %, Apply 1 application topically 2 (two) times daily as needed (scalp itching)., Disp: , Rfl:  Social History   Socioeconomic History  . Marital status: Widowed    Spouse name: Not on file  . Number of children: Not on file  . Years of education: Not on file  . Highest education level: Not on file  Occupational History  . Not on file  Tobacco Use  . Smoking status: Former Research scientist (life sciences)  . Smokeless tobacco: Never Used  . Tobacco comment: quit smoking 32yr ago  Vaping Use  . Vaping Use: Never used  Substance and Sexual Activity  . Alcohol use: No    Alcohol/week: 0.0 standard drinks  . Drug use: No  . Sexual activity: Yes    Birth control/protection: Post-menopausal   Other Topics Concern  . Not on file  Social History Narrative  . Not on file   Social Determinants of Health   Financial Resource Strain: Not on file  Food Insecurity: Not on file  Transportation Needs: Not on file  Physical Activity: Not on file  Stress: Not on file  Social Connections: Not on file  Intimate Partner Violence: Not on file   Family History  Problem Relation Age of Onset  . Prostate cancer Father   . Cancer Father   . Lung disease Sister   . Cancer Brother     Objective: Office vital signs reviewed. BP (!) 128/50   Pulse (!) 58   Temp 97.6 F (36.4 C)   Ht 5' 3"  (1.6 m)   Wt 131 lb 6.4 oz (59.6 kg)   SpO2 98%   BMI 23.28 kg/m   Physical Examination:  General: Awake, alert, well nourished, chronically ill-appearing.  No acute distress Cardio: Irregularly irregular with regular rate, S1S2 heard, no murmurs appreciated Pulm: clear to auscultation bilaterally, no wheezes, rhonchi or rales; normal work of breathing on room air Extremities: warm, well perfused, No edema, cyanosis or clubbing; +2 pulses bilaterally MSK: Ambulating independently but exquisitely tender to palpation along the coccyx.  Her gait is antalgic and stiff Neuro : No tremor Assessment/ Plan: 87y.o. female   Paroxysmal atrial fibrillation (HCC) - Plan: Hemoglobin, fingerstick, CMP14+EGFR  Hypothyroidism due to acquired atrophy of thyroid - Plan: TSH, T4, Free  Sacral back pain - Plan: DG Pelvis 1-2 Views, predniSONE (STERAPRED UNI-PAK 21 TAB) 10 MG (21) TBPK tablet  Rate and rhythm controlled today.  Check hemoglobin, CMP  Asymptomatic from a thyroid standpoint.  Check TSH, T4  Uncertain etiology of sacral back pain.  Plain films were obtained given age and concern for potential atypical osteoporotic fracture.  She was quite tender to palpation on exam today.  I have sent her home with a corticosteroid Dosepak to use if needed.  Would use Tylenol if needed for pain.  Avoid NSAIDs  given chronic anticoagulation  No orders of the defined types were placed in this encounter.  No orders of the defined types were placed in this encounter.    AJanora Norlander DO [facility]([phone]

## 2020-06-16 NOTE — Patient Instructions (Signed)
You had labs performed today.  You will be contacted with the results of the labs once they are available, usually in the next 3 business days for routine lab work.  If you have an active my chart account, they will be released to your MyChart.  If you prefer to have these labs released to you via telephone, please let us know.  NO ibuprofen or Naproxen since you take eliquis  TYLENOL or ACETAMINOPHEN is OK to take up to 3 times daily if needed for pain. I've given you a written script for prednisone if pain is not controlled with Tylenol.

## 2020-06-17 LAB — CMP14+EGFR
ALT: 14 IU/L (ref 0–32)
AST: 16 IU/L (ref 0–40)
Albumin/Globulin Ratio: 2 (ref 1.2–2.2)
Albumin: 4.7 g/dL — ABNORMAL HIGH (ref 3.5–4.6)
Alkaline Phosphatase: 80 IU/L (ref 44–121)
BUN/Creatinine Ratio: 21 (ref 12–28)
BUN: 19 mg/dL (ref 10–36)
Bilirubin Total: 0.4 mg/dL (ref 0.0–1.2)
CO2: 22 mmol/L (ref 20–29)
Calcium: 9.9 mg/dL (ref 8.7–10.3)
Chloride: 98 mmol/L (ref 96–106)
Creatinine, Ser: 0.9 mg/dL (ref 0.57–1.00)
Globulin, Total: 2.4 g/dL (ref 1.5–4.5)
Glucose: 145 mg/dL — ABNORMAL HIGH (ref 65–99)
Potassium: 4.4 mmol/L (ref 3.5–5.2)
Sodium: 139 mmol/L (ref 134–144)
Total Protein: 7.1 g/dL (ref 6.0–8.5)
eGFR: 61 mL/min/{1.73_m2} (ref >59)

## 2020-06-17 LAB — T4, FREE: Free T4: 1.3 ng/dL (ref 0.82–1.77)

## 2020-06-17 LAB — TSH: TSH: 1.45 u[IU]/mL (ref 0.450–4.500)

## 2020-06-17 NOTE — Progress Notes (Signed)
Pt r/c about labs 

## 2020-06-18 ENCOUNTER — Ambulatory Visit: Payer: Medicare Other | Admitting: Family Medicine

## 2020-06-30 ENCOUNTER — Other Ambulatory Visit: Payer: Self-pay

## 2020-06-30 ENCOUNTER — Encounter (INDEPENDENT_AMBULATORY_CARE_PROVIDER_SITE_OTHER): Payer: Federal, State, Local not specified - PPO | Admitting: Ophthalmology

## 2020-06-30 DIAGNOSIS — H353231 Exudative age-related macular degeneration, bilateral, with active choroidal neovascularization: Secondary | ICD-10-CM | POA: Diagnosis not present

## 2020-06-30 DIAGNOSIS — H43813 Vitreous degeneration, bilateral: Secondary | ICD-10-CM

## 2020-06-30 DIAGNOSIS — I10 Essential (primary) hypertension: Secondary | ICD-10-CM | POA: Diagnosis not present

## 2020-06-30 DIAGNOSIS — H35033 Hypertensive retinopathy, bilateral: Secondary | ICD-10-CM

## 2020-07-28 ENCOUNTER — Encounter (INDEPENDENT_AMBULATORY_CARE_PROVIDER_SITE_OTHER): Payer: Federal, State, Local not specified - PPO | Admitting: Ophthalmology

## 2020-07-28 ENCOUNTER — Other Ambulatory Visit: Payer: Self-pay

## 2020-07-28 DIAGNOSIS — H35033 Hypertensive retinopathy, bilateral: Secondary | ICD-10-CM

## 2020-07-28 DIAGNOSIS — H43813 Vitreous degeneration, bilateral: Secondary | ICD-10-CM

## 2020-07-28 DIAGNOSIS — I10 Essential (primary) hypertension: Secondary | ICD-10-CM | POA: Diagnosis not present

## 2020-07-28 DIAGNOSIS — H353231 Exudative age-related macular degeneration, bilateral, with active choroidal neovascularization: Secondary | ICD-10-CM

## 2020-08-25 ENCOUNTER — Encounter (INDEPENDENT_AMBULATORY_CARE_PROVIDER_SITE_OTHER): Payer: Federal, State, Local not specified - PPO | Admitting: Ophthalmology

## 2020-08-25 ENCOUNTER — Other Ambulatory Visit: Payer: Self-pay

## 2020-08-25 DIAGNOSIS — I10 Essential (primary) hypertension: Secondary | ICD-10-CM | POA: Diagnosis not present

## 2020-08-25 DIAGNOSIS — H43813 Vitreous degeneration, bilateral: Secondary | ICD-10-CM

## 2020-08-25 DIAGNOSIS — H35033 Hypertensive retinopathy, bilateral: Secondary | ICD-10-CM

## 2020-08-25 DIAGNOSIS — H353231 Exudative age-related macular degeneration, bilateral, with active choroidal neovascularization: Secondary | ICD-10-CM | POA: Diagnosis not present

## 2020-09-22 ENCOUNTER — Encounter (INDEPENDENT_AMBULATORY_CARE_PROVIDER_SITE_OTHER): Payer: Federal, State, Local not specified - PPO | Admitting: Ophthalmology

## 2020-09-22 ENCOUNTER — Other Ambulatory Visit: Payer: Self-pay

## 2020-09-22 DIAGNOSIS — H353231 Exudative age-related macular degeneration, bilateral, with active choroidal neovascularization: Secondary | ICD-10-CM

## 2020-09-22 DIAGNOSIS — H43813 Vitreous degeneration, bilateral: Secondary | ICD-10-CM | POA: Diagnosis not present

## 2020-09-22 DIAGNOSIS — H35033 Hypertensive retinopathy, bilateral: Secondary | ICD-10-CM | POA: Diagnosis not present

## 2020-09-22 DIAGNOSIS — I10 Essential (primary) hypertension: Secondary | ICD-10-CM

## 2020-10-10 ENCOUNTER — Other Ambulatory Visit: Payer: Self-pay | Admitting: Family Medicine

## 2020-10-11 NOTE — Telephone Encounter (Signed)
[facility] Needs 6 mos October Appt. Mail order sent

## 2020-10-20 ENCOUNTER — Other Ambulatory Visit: Payer: Self-pay

## 2020-10-20 ENCOUNTER — Encounter (INDEPENDENT_AMBULATORY_CARE_PROVIDER_SITE_OTHER): Payer: Federal, State, Local not specified - PPO | Admitting: Ophthalmology

## 2020-10-20 DIAGNOSIS — H35033 Hypertensive retinopathy, bilateral: Secondary | ICD-10-CM | POA: Diagnosis not present

## 2020-10-20 DIAGNOSIS — H43813 Vitreous degeneration, bilateral: Secondary | ICD-10-CM

## 2020-10-20 DIAGNOSIS — H353231 Exudative age-related macular degeneration, bilateral, with active choroidal neovascularization: Secondary | ICD-10-CM

## 2020-10-20 DIAGNOSIS — I10 Essential (primary) hypertension: Secondary | ICD-10-CM | POA: Diagnosis not present

## 2020-10-24 ENCOUNTER — Other Ambulatory Visit: Payer: Self-pay | Admitting: Family Medicine

## 2020-11-24 ENCOUNTER — Encounter (INDEPENDENT_AMBULATORY_CARE_PROVIDER_SITE_OTHER): Payer: Medicare Other | Admitting: Ophthalmology

## 2020-11-25 ENCOUNTER — Encounter (INDEPENDENT_AMBULATORY_CARE_PROVIDER_SITE_OTHER): Payer: Federal, State, Local not specified - PPO | Admitting: Ophthalmology

## 2020-11-25 ENCOUNTER — Other Ambulatory Visit: Payer: Self-pay

## 2020-11-25 DIAGNOSIS — I10 Essential (primary) hypertension: Secondary | ICD-10-CM

## 2020-11-25 DIAGNOSIS — H35033 Hypertensive retinopathy, bilateral: Secondary | ICD-10-CM | POA: Diagnosis not present

## 2020-11-25 DIAGNOSIS — H43813 Vitreous degeneration, bilateral: Secondary | ICD-10-CM

## 2020-11-25 DIAGNOSIS — H353231 Exudative age-related macular degeneration, bilateral, with active choroidal neovascularization: Secondary | ICD-10-CM

## 2020-12-16 ENCOUNTER — Other Ambulatory Visit: Payer: Self-pay | Admitting: Family Medicine

## 2020-12-29 ENCOUNTER — Encounter (INDEPENDENT_AMBULATORY_CARE_PROVIDER_SITE_OTHER): Payer: Federal, State, Local not specified - PPO | Admitting: Ophthalmology

## 2020-12-29 ENCOUNTER — Other Ambulatory Visit: Payer: Self-pay

## 2020-12-29 DIAGNOSIS — I10 Essential (primary) hypertension: Secondary | ICD-10-CM

## 2020-12-29 DIAGNOSIS — H353231 Exudative age-related macular degeneration, bilateral, with active choroidal neovascularization: Secondary | ICD-10-CM

## 2020-12-29 DIAGNOSIS — H43813 Vitreous degeneration, bilateral: Secondary | ICD-10-CM

## 2020-12-29 DIAGNOSIS — H35033 Hypertensive retinopathy, bilateral: Secondary | ICD-10-CM

## 2021-02-02 ENCOUNTER — Other Ambulatory Visit: Payer: Self-pay

## 2021-02-02 ENCOUNTER — Encounter (INDEPENDENT_AMBULATORY_CARE_PROVIDER_SITE_OTHER): Payer: Federal, State, Local not specified - PPO | Admitting: Ophthalmology

## 2021-02-02 DIAGNOSIS — H353231 Exudative age-related macular degeneration, bilateral, with active choroidal neovascularization: Secondary | ICD-10-CM | POA: Diagnosis not present

## 2021-02-02 DIAGNOSIS — H43813 Vitreous degeneration, bilateral: Secondary | ICD-10-CM

## 2021-02-02 DIAGNOSIS — I10 Essential (primary) hypertension: Secondary | ICD-10-CM

## 2021-02-02 DIAGNOSIS — H35033 Hypertensive retinopathy, bilateral: Secondary | ICD-10-CM

## 2021-02-04 ENCOUNTER — Encounter: Payer: Self-pay | Admitting: Family Medicine

## 2021-02-04 ENCOUNTER — Ambulatory Visit (INDEPENDENT_AMBULATORY_CARE_PROVIDER_SITE_OTHER): Payer: Federal, State, Local not specified - PPO | Admitting: Family Medicine

## 2021-02-04 VITALS — BP 133/60 | HR 73 | Temp 97.1°F | Ht 63.0 in | Wt 133.2 lb

## 2021-02-04 DIAGNOSIS — E034 Atrophy of thyroid (acquired): Secondary | ICD-10-CM

## 2021-02-04 DIAGNOSIS — I10 Essential (primary) hypertension: Secondary | ICD-10-CM

## 2021-02-04 DIAGNOSIS — I700 Atherosclerosis of aorta: Secondary | ICD-10-CM | POA: Diagnosis not present

## 2021-02-04 DIAGNOSIS — Z23 Encounter for immunization: Secondary | ICD-10-CM

## 2021-02-04 DIAGNOSIS — I480 Paroxysmal atrial fibrillation: Secondary | ICD-10-CM | POA: Diagnosis not present

## 2021-02-04 NOTE — Progress Notes (Signed)
Subjective: CC: Chronic follow-up PCP: [name], DO VEH:MCNOBSJ Jenna Ramos is a 87 y.o. female presenting to clinic today for:  1.  Hypertension with hyperlipidemia/aortic atherosclerosis/carotid artery disease Patient is compliant with Norvasc, Lipitor and Lotensin.  No reports of chest pain, shortness of breath.  She reports that she feels relatively well except for occasional arthritis.  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  Atrial fibrillation, chronically anticoagulated Patient is compliant with metoprolol and Eliquis 2.5 mg.  She denies any GI bleeding, vaginal bleeding or hematuria.  No epistaxis reported.  No heart palpitations or tachycardia noted.  Would like samples of Eliquis if we have any  3.  Hypothyroidism Patient is compliant with Synthroid 75 mcg daily.  No difficulty swallowing, tremor or heart palpitations.  No changes in bowel habits   ROS: Per HPI  Allergies  Allergen Reactions   Aggrenox [Aspirin-Dipyridamole Er] Anaphylaxis    Severe headache   Codeine Other (See Comments)    Feet burn and agitation   Morphine And Related Other (See Comments)    Feet burn and agitation   Gabapentin     Difficulty concentrating   Past Medical History:  Diagnosis Date   Anemia    takes Iron pill daily   Arthritis    Carotid artery occlusion    Fibromyalgia    Fibromyalgia    Glaucoma    uses Eye Drops daily   HOH (hard of hearing)    Hyperlipidemia    takes Atorvastatin daily   Hypertension    takes Benazepril daily   Hypothyroidism    takes Synthroid daily   Macular degeneration    wet and gets injections in both eyes   Nocturia    Peripheral vascular disease (HCC)    Rectal incontinence    Stroke [facility])    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 10/28/2013    Current Outpatient Medications:    Acetaminophen (TYLENOL PO), Take 1-2 tablets by mouth  every 6 (six) hours as needed (pain/headache)., Disp: , Rfl:    amLODipine (NORVASC) 10 MG tablet, TAKE 1 TABLET BY MOUTH EVERY DAY, Disp: 90 tablet, Rfl: 0   atorvastatin (LIPITOR) 20 MG tablet, TAKE 1 TABLET BY MOUTH DAILY AT 6 PM., Disp: 90 tablet, Rfl: 0   benazepril (LOTENSIN) 20 MG tablet, TAKE 1 TABLET BY MOUTH EVERY DAY, Disp: 90 tablet, Rfl: 0   Besifloxacin HCl 0.6 % SUSP, Place 1 drop into both eyes See admin instructions. Instill one drop into both eyes 4 times daily on the day of and the day after eye injections - once a month, Disp: , Rfl:    bimatoprost (LUMIGAN) 0.01 % SOLN, Place 1 drop into both eyes at bedtime., Disp: , Rfl:    ELIQUIS 2.5 MG TABS tablet, TAKE 1 TABLET TWICE A DAY, Disp: 180 tablet, Rfl: 0   levothyroxine (SYNTHROID) 75 MCG tablet, TAKE 1 TABLET (75 MCG TOTAL) BY MOUTH DAILY BEFORE BREAKFAST., Disp: 90 tablet, Rfl: 1   metoprolol succinate (TOPROL-XL) 50 MG 24 hr tablet, TAKE 1 TABLET BY MOUTH EVERY DAY, Disp: 90 tablet, Rfl: 0   Multiple Vitamin (MULTIVITAMIN WITH MINERALS) TABS tablet, Take 1 tablet by mouth daily., Disp: , Rfl:  Social History   Socioeconomic History   Marital status: Widowed    Spouse name: Not on file   Number of children: Not on file   Years  of education: Not on file   Highest education level: Not on file  Occupational History   Not on file  Tobacco Use   Smoking status: Former   Smokeless tobacco: Never   Tobacco comments:    quit smoking 70yrs ago  Vaping Use   Vaping Use: Never used  Substance and Sexual Activity   Alcohol use: No    Alcohol/week: 0.0 standard drinks   Drug use: No   Sexual activity: Yes    Birth control/protection: Post-menopausal  Other Topics Concern   Not on file  Social History Narrative   Not on file   Social Determinants of Health   Financial Resource Strain: Not on file  Food Insecurity: Not on file  Transportation Needs: Not on file  Physical Activity: Not on file  Stress: Not on file   Social Connections: Not on file  Intimate Partner Violence: Not on file   Family History  Problem Relation Age of Onset   Prostate cancer Father    Cancer Father    Lung disease Sister    Cancer Brother     Objective: Office vital signs reviewed. BP 133/60   Pulse 73   Temp (!) 97.1 F (36.2 C)   Ht 5\' 3"  (1.6 m)   Wt 133 lb 3.2 oz (60.4 kg)   SpO2 97%   BMI 23.60 kg/m   Physical Examination:  General: Awake, alert, well nourished elderly female, No acute distress HEENT: Normal; no exophthalmos.  No goiter Cardio: Irregularly irregular with rate controlled, S1S2 heard, no murmurs appreciated Pulm: clear to auscultation bilaterally, no wheezes, rhonchi or rales; normal work of breathing on room air Neuro: No tremor Skin: Normal temperature MSK: Mild arthritic changes noted at the DIP and PIP joints of the hands bilaterally  Assessment/ Plan: 87 y.o. female   Paroxysmal atrial fibrillation ([place]) - Plan: CBC  Hypothyroidism due to acquired atrophy of thyroid - Plan: TSH, T4, Free  Need for immunization against influenza - Plan: Flu Vaccine QUAD High Dose(Fluad)  Essential hypertension  Aortic atherosclerosis (HCC)  A. fib is rate controlled.  Hemoglobin checked given chronic anticoagulation.  2 weeks worth of samples of Eliquis 2.5 mg provided to the patient today  She is asymptomatic from a thyroid standpoint.  Check TSH, free T4  Influenza vaccination administered  Blood pressure well controlled and sounds even better at home compared to our visit today.  Continue statin therapy given history of TIA, carotid artery disease and aortic atherosclerosis    No orders of the defined types were placed in this encounter.  No orders of the defined types were placed in this encounter.    [name], DO [place] [phone]

## 2021-02-05 LAB — CBC
Hematocrit: 38.7 % (ref 34.0–46.6)
Hemoglobin: 12.5 g/dL (ref 11.1–15.9)
MCH: 28.7 pg (ref 26.6–33.0)
MCHC: 32.3 g/dL (ref 31.5–35.7)
MCV: 89 fL (ref 79–97)
Platelets: 310 10*3/uL (ref 150–450)
RBC: 4.35 x10E6/uL (ref 3.77–5.28)
RDW: 13.5 % (ref 11.7–15.4)
WBC: 9.2 10*3/uL (ref 3.4–10.8)

## 2021-02-05 LAB — T4, FREE: Free T4: 1.32 ng/dL (ref 0.82–1.77)

## 2021-02-05 LAB — TSH: TSH: 1.46 u[IU]/mL (ref 0.450–4.500)

## 2021-02-06 ENCOUNTER — Other Ambulatory Visit: Payer: Self-pay | Admitting: Family Medicine

## 2021-02-07 ENCOUNTER — Other Ambulatory Visit: Payer: Self-pay | Admitting: Family Medicine

## 2021-02-07 MED ORDER — LEVOTHYROXINE SODIUM 75 MCG PO TABS: 75.0000 ug | ORAL_TABLET | Freq: Every day | ORAL | 1 refills | Status: DC

## 2021-02-10 ENCOUNTER — Other Ambulatory Visit: Payer: Self-pay | Admitting: Family Medicine

## 2021-03-09 ENCOUNTER — Encounter (INDEPENDENT_AMBULATORY_CARE_PROVIDER_SITE_OTHER): Payer: Federal, State, Local not specified - PPO | Admitting: Ophthalmology

## 2021-03-09 DIAGNOSIS — H353231 Exudative age-related macular degeneration, bilateral, with active choroidal neovascularization: Secondary | ICD-10-CM | POA: Diagnosis not present

## 2021-03-09 DIAGNOSIS — H43813 Vitreous degeneration, bilateral: Secondary | ICD-10-CM

## 2021-03-09 DIAGNOSIS — I10 Essential (primary) hypertension: Secondary | ICD-10-CM | POA: Diagnosis not present

## 2021-03-09 DIAGNOSIS — H35033 Hypertensive retinopathy, bilateral: Secondary | ICD-10-CM | POA: Diagnosis not present

## 2021-03-16 ENCOUNTER — Telehealth: Payer: Self-pay | Admitting: Family Medicine

## 2021-03-16 NOTE — Telephone Encounter (Signed)
Letter written and up front - pt aware - she will pick up tomorrow

## 2021-03-16 NOTE — Telephone Encounter (Signed)
Ok to write letter

## 2021-03-16 NOTE — Telephone Encounter (Signed)
Yes, I think that is fine.  She def could you any extra assistance due to mobility

## 2021-04-13 ENCOUNTER — Other Ambulatory Visit: Payer: Self-pay

## 2021-04-13 ENCOUNTER — Encounter (INDEPENDENT_AMBULATORY_CARE_PROVIDER_SITE_OTHER): Payer: Federal, State, Local not specified - PPO | Admitting: Ophthalmology

## 2021-04-13 DIAGNOSIS — H43813 Vitreous degeneration, bilateral: Secondary | ICD-10-CM

## 2021-04-13 DIAGNOSIS — I10 Essential (primary) hypertension: Secondary | ICD-10-CM | POA: Diagnosis not present

## 2021-04-13 DIAGNOSIS — H35033 Hypertensive retinopathy, bilateral: Secondary | ICD-10-CM | POA: Diagnosis not present

## 2021-04-13 DIAGNOSIS — H353231 Exudative age-related macular degeneration, bilateral, with active choroidal neovascularization: Secondary | ICD-10-CM

## 2021-04-16 ENCOUNTER — Other Ambulatory Visit: Payer: Self-pay | Admitting: Family Medicine

## 2021-05-16 ENCOUNTER — Other Ambulatory Visit: Payer: Self-pay | Admitting: Family Medicine

## 2021-05-17 ENCOUNTER — Other Ambulatory Visit: Payer: Self-pay

## 2021-05-17 ENCOUNTER — Encounter (INDEPENDENT_AMBULATORY_CARE_PROVIDER_SITE_OTHER): Payer: Federal, State, Local not specified - PPO | Admitting: Ophthalmology

## 2021-05-17 DIAGNOSIS — H353231 Exudative age-related macular degeneration, bilateral, with active choroidal neovascularization: Secondary | ICD-10-CM

## 2021-05-17 DIAGNOSIS — H35033 Hypertensive retinopathy, bilateral: Secondary | ICD-10-CM

## 2021-05-17 DIAGNOSIS — H43813 Vitreous degeneration, bilateral: Secondary | ICD-10-CM

## 2021-05-17 DIAGNOSIS — I10 Essential (primary) hypertension: Secondary | ICD-10-CM

## 2021-06-22 ENCOUNTER — Encounter (INDEPENDENT_AMBULATORY_CARE_PROVIDER_SITE_OTHER): Payer: Federal, State, Local not specified - PPO | Admitting: Ophthalmology

## 2021-06-22 DIAGNOSIS — H353231 Exudative age-related macular degeneration, bilateral, with active choroidal neovascularization: Secondary | ICD-10-CM | POA: Diagnosis not present

## 2021-06-22 DIAGNOSIS — H43813 Vitreous degeneration, bilateral: Secondary | ICD-10-CM

## 2021-06-22 DIAGNOSIS — H35033 Hypertensive retinopathy, bilateral: Secondary | ICD-10-CM

## 2021-06-22 DIAGNOSIS — I10 Essential (primary) hypertension: Secondary | ICD-10-CM | POA: Diagnosis not present

## 2021-07-27 ENCOUNTER — Encounter (INDEPENDENT_AMBULATORY_CARE_PROVIDER_SITE_OTHER): Payer: Federal, State, Local not specified - PPO | Admitting: Ophthalmology

## 2021-07-27 DIAGNOSIS — I10 Essential (primary) hypertension: Secondary | ICD-10-CM | POA: Diagnosis not present

## 2021-07-27 DIAGNOSIS — H43813 Vitreous degeneration, bilateral: Secondary | ICD-10-CM

## 2021-07-27 DIAGNOSIS — H353231 Exudative age-related macular degeneration, bilateral, with active choroidal neovascularization: Secondary | ICD-10-CM

## 2021-07-27 DIAGNOSIS — H35033 Hypertensive retinopathy, bilateral: Secondary | ICD-10-CM | POA: Diagnosis not present

## 2021-07-30 ENCOUNTER — Other Ambulatory Visit: Payer: Self-pay | Admitting: Family Medicine

## 2021-08-08 ENCOUNTER — Encounter: Payer: Self-pay | Admitting: Family Medicine

## 2021-08-08 ENCOUNTER — Ambulatory Visit: Payer: Federal, State, Local not specified - PPO | Admitting: Family Medicine

## 2021-08-08 VITALS — BP 134/74 | HR 66 | Temp 97.2°F | Ht 63.0 in | Wt 130.8 lb

## 2021-08-08 DIAGNOSIS — E034 Atrophy of thyroid (acquired): Secondary | ICD-10-CM

## 2021-08-08 DIAGNOSIS — Z0001 Encounter for general adult medical examination with abnormal findings: Secondary | ICD-10-CM

## 2021-08-08 DIAGNOSIS — I10 Essential (primary) hypertension: Secondary | ICD-10-CM | POA: Diagnosis not present

## 2021-08-08 DIAGNOSIS — I700 Atherosclerosis of aorta: Secondary | ICD-10-CM

## 2021-08-08 DIAGNOSIS — I480 Paroxysmal atrial fibrillation: Secondary | ICD-10-CM

## 2021-08-08 DIAGNOSIS — Z0000 Encounter for general adult medical examination without abnormal findings: Secondary | ICD-10-CM

## 2021-08-08 NOTE — Progress Notes (Signed)
Jenna Ramos is a 88 y.o. female presents to office today for annual physical exam examination.    Concerns today include: 1.  Hypothyroidism Patient is compliant with Synthroid 75 mcg daily.  No reports of tremor, heart palpitations, difficulty swallowing  2.  Hypertension with hyperlipidemia Patient is compliant with Norvasc, Lipitor.  She takes metoprolol for her atrial fibrillation along with her Eliquis and denies any bleeding.  No reports of change in exercise tolerance.  In fact, she is currently having a walkway placed outside so that she can navigate her yard a little more easily.  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  Occupation: retired, Marital status: widowed, Substance use: none Diet: balanced, Exercise: tries to walk daily with her dog Last eye exam: UTD Last dental exam: UTD Last colonoscopy: UTD Last mammogram: UTD Last pap smear: n/a Refills needed today: none Immunizations needed: Immunization History  Administered Date(s) Administered   Fluad Quad(high Dose 65+) 11/19/2018, 02/16/2020, 02/04/2021   Influenza, High Dose Seasonal PF 11/29/2015, 12/11/2017   Influenza,inj,Quad PF,6+ Mos 12/16/2014, 12/19/2016   Moderna Sars-Covid-2 Vaccination 04/15/2019, 05/13/2019   Pneumococcal Conjugate-13 03/18/2018   Zoster, Live 10/28/2012     Past Medical History:  Diagnosis Date   Anemia    takes Iron pill daily   Arthritis    Carotid artery occlusion    Fibromyalgia    Fibromyalgia    Glaucoma    uses Eye Drops daily   HOH (hard of hearing)    Hyperlipidemia    takes Atorvastatin daily   Hypertension    takes Benazepril daily   Hypothyroidism    takes Synthroid daily   Macular degeneration    wet and gets injections in both eyes   Nocturia    Peripheral vascular disease ([place])    Rectal incontinence    Stroke [facility])     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 10/28/2013   Social History   Socioeconomic History   Marital status: Widowed    Spouse name: Not on file   Number of children: Not on file   Years of education: Not on file   Highest education level: Not on file  Occupational History   Not on file  Tobacco Use   Smoking status: Former   Smokeless tobacco: Never   Tobacco comments:    quit smoking 44yr ago  Vaping Use   Vaping Use: Never used  Substance and Sexual Activity   Alcohol use: No    Alcohol/week: 0.0 standard drinks   Drug use: No   Sexual activity: Yes    Birth control/protection: Post-menopausal  Other Topics Concern   Not on file  Social History Narrative   Not on file   Social Determinants of Health   Financial Resource Strain: Not on file  Food Insecurity: Not on file  Transportation Needs: Not on file  Physical Activity: Not on file  Stress: Not on file  Social Connections: Not on file  Intimate Partner Violence: Not on file   Past Surgical History:  Procedure Laterality Date   APPENDECTOMY     CAROTID ENDARTERECTOMY     cataract surgery Bilateral    COLONOSCOPY     ENDARTERECTOMY Left 02/19/2014   Procedure: ENDARTERECTOMY CAROTID;  Surgeon: BConrad Custar MD;  [facility]  Service: Vascular;  Laterality: Left;   ESOPHAGOGASTRODUODENOSCOPY  TONSILLECTOMY     TOTAL HIP ARTHROPLASTY Left 10/2013   TUBAL LIGATION     WRIST SURGERY     pins and screws   Family History  Problem Relation Age of Onset   Prostate cancer Father    Cancer Father    Lung disease Sister    Cancer Brother     Current Outpatient Medications:    Acetaminophen (TYLENOL PO), Take 1-2 tablets by mouth every 6 (six) hours as needed (pain/headache)., Disp: , Rfl:    amLODipine (NORVASC) 10 MG tablet, TAKE 1 TABLET BY MOUTH EVERY DAY, Disp: 90 tablet, Rfl: 1   atorvastatin (LIPITOR) 20 MG tablet, TAKE 1 TABLET BY MOUTH DAILY AT 6 PM., Disp: 90 tablet, Rfl:  0   benazepril (LOTENSIN) 20 MG tablet, TAKE 1 TABLET BY MOUTH EVERY DAY, Disp: 90 tablet, Rfl: 1   Besifloxacin HCl 0.6 % SUSP, Place 1 drop into both eyes See admin instructions. Instill one drop into both eyes 4 times daily on the day of and the day after eye injections - once a month, Disp: , Rfl:    bimatoprost (LUMIGAN) 0.01 % SOLN, Place 1 drop into both eyes at bedtime., Disp: , Rfl:    ELIQUIS 2.5 MG TABS tablet, TAKE 1 TABLET TWICE A DAY, Disp: 60 tablet, Rfl: 0   levothyroxine (SYNTHROID) 75 MCG tablet, Take 1 tablet (75 mcg total) by mouth daily before breakfast., Disp: 90 tablet, Rfl: 1   metoprolol succinate (TOPROL-XL) 50 MG 24 hr tablet, TAKE 1 TABLET BY MOUTH EVERY DAY, Disp: 90 tablet, Rfl: 1   Multiple Vitamin (MULTIVITAMIN WITH MINERALS) TABS tablet, Take 1 tablet by mouth daily., Disp: , Rfl:    Netarsudil Dimesylate (RHOPRESSA) 0.02 % SOLN, Apply to eye., Disp: , Rfl:   Allergies  Allergen Reactions   Aggrenox [Aspirin-Dipyridamole Er] Anaphylaxis    Severe headache   Codeine Other (See Comments)    Feet burn and agitation   Morphine And Related Other (See Comments)    Feet burn and agitation   Gabapentin     Difficulty concentrating     ROS: Review of Systems Pertinent items noted in HPI and remainder of comprehensive ROS otherwise negative.    Physical exam BP (!) 153/67   Pulse 66   Temp (!) 97.2 F (36.2 C)   Ht 5' 3"  (1.6 m)   Wt 130 lb 12.8 oz (59.3 kg)   SpO2 97%   BMI 23.17 kg/m  General appearance: alert, cooperative, appears stated age, and no distress Head: Normocephalic, without obvious abnormality, atraumatic Eyes: negative findings: lids and lashes normal, conjunctivae and sclerae normal, corneas clear, and pupils equal, round, reactive to light and accomodation Ears: normal TM's and external ear canals both ears Nose: Nares normal. Septum midline. Mucosa normal. No drainage or sinus tenderness. Throat: lips, mucosa, and tongue normal;  teeth and gums normal Neck: no adenopathy, no carotid bruit, supple, symmetrical, trachea midline, and thyroid not enlarged, symmetric, no tenderness/mass/nodules Back: symmetric, no curvature. ROM normal. No CVA tenderness. Lungs: clear to auscultation bilaterally Heart:  Irregularly irregular with rate control and a questionable soft systolic murmur Abdomen: soft, non-tender; bowel sounds normal; no masses,  no organomegaly Extremities: extremities normal, atraumatic, no cyanosis or edema Pulses: 2+ and symmetric Skin:  Multiple pigmented nevi and seborrheic keratoses noted along the neck and trunk Lymph nodes: Cervical, supraclavicular, and axillary nodes normal. Neurologic: Hard of hearing.   [place] Office Visit from 08/08/2021 in [facility]  PHQ-2 Total Score 0      Assessment/ Plan: Cherylin Mylar here for annual physical exam.   Paroxysmal atrial fibrillation ([place]) - Plan: CMP14+EGFR, T4, Free, TSH, CBC, Magnesium  Annual physical exam  Hypothyroidism due to acquired atrophy of thyroid - Plan: T4, Free, TSH  Essential hypertension - Plan: CMP14+EGFR, Lipid Panel  Aortic atherosclerosis ([place]) - Plan: Lipid Panel  Right controlled.  No red flag signs or symptoms with use of Eliquis.  Check metabolic panel, TSH, free T4 and CBC  Asymptomatic from a thyroid standpoint.  Check thyroid levels.  Continue Synthroid  Blood pressure controlled upon recheck.  No changes.  Check lipid panel.  Continue statin.  May follow-up in 6 months, sooner if concerns arise  Tineka Uriegas M. Lajuana Ripple, DO

## 2021-08-08 NOTE — Patient Instructions (Signed)
You had labs performed today.  You will be contacted with the results of the labs once they are available, usually in the next 3 business days for routine lab work.  If you have an active my chart account, they will be released to your MyChart.  If you prefer to have these labs released to you via telephone, please let us know.   Fall Prevention in the Home, Adult Falls can cause injuries and can happen to people of all ages. There are many things you can do to make your home safe and to help prevent falls. Ask for help when making these changes. What actions can I take to prevent falls? General Instructions Use good lighting in all rooms. Replace any light bulbs that burn out. Turn on the lights in dark areas. Use night-lights. Keep items that you use often in easy-to-reach places. Lower the shelves around your home if needed. Set up your furniture so you have a clear path. Avoid moving your furniture around. Do not have throw rugs or other things on the floor that can make you trip. Avoid walking on wet floors. If any of your floors are uneven, fix them. Add color or contrast paint or tape to clearly mark and help you see: Grab bars or handrails. First and last steps of staircases. Where the edge of each step is. If you use a stepladder: Make sure that it is fully opened. Do not climb a closed stepladder. Make sure the sides of the stepladder are locked in place. Ask someone to hold the stepladder while you use it. Know where your pets are when moving through your home. What can I do in the bathroom?     Keep the floor dry. Clean up any water on the floor right away. Remove soap buildup in the tub or shower. Use nonskid mats or decals on the floor of the tub or shower. Attach bath mats securely with double-sided, nonslip rug tape. If you need to sit down in the shower, use a plastic, nonslip stool. Install grab bars by the toilet and in the tub and shower. Do not use towel bars as  grab bars. What can I do in the bedroom? Make sure that you have a light by your bed that is easy to reach. Do not use any sheets or blankets for your bed that hang to the floor. Have a firm chair with side arms that you can use for support when you get dressed. What can I do in the kitchen? Clean up any spills right away. If you need to reach something above you, use a step stool with a grab bar. Keep electrical cords out of the way. Do not use floor polish or wax that makes floors slippery. What can I do with my stairs? Do not leave any items on the stairs. Make sure that you have a light switch at the top and the bottom of the stairs. Make sure that there are handrails on both sides of the stairs. Fix handrails that are broken or loose. Install nonslip stair treads on all your stairs. Avoid having throw rugs at the top or bottom of the stairs. Choose a carpet that does not hide the edge of the steps on the stairs. Check carpeting to make sure that it is firmly attached to the stairs. Fix carpet that is loose or worn. What can I do on the outside of my home? Use bright outdoor lighting. Fix the edges of walkways and driveways and  fix any cracks. Remove anything that might make you trip as you walk through a door, such as a raised step or threshold. Trim any bushes or trees on paths to your home. Check to see if handrails are loose or broken and that both sides of all steps have handrails. Install guardrails along the edges of any raised decks and porches. Clear paths of anything that can make you trip, such as tools or rocks. Have leaves, snow, or ice cleared regularly. Use sand or salt on paths during winter. Clean up any spills in your garage right away. This includes grease or oil spills. What other actions can I take? Wear shoes that: Have a low heel. Do not wear high heels. Have rubber bottoms. Feel good on your feet and fit well. Are closed at the toe. Do not wear open-toe  sandals. Use tools that help you move around if needed. These include: Canes. Walkers. Scooters. Crutches. Review your medicines with your doctor. Some medicines can make you feel dizzy. This can increase your chance of falling. Ask your doctor what else you can do to help prevent falls. Where to find more information Centers for Disease Control and Prevention, [employer]: http://www.wolf.info/ National Institute on Aging: http://kim-miller.com/ Contact a doctor if: You are afraid of falling at home. You feel weak, drowsy, or dizzy at home. You fall at home. Summary There are many simple things that you can do to make your home safe and to help prevent falls. Ways to make your home safe include removing things that can make you trip and installing grab bars in the bathroom. Ask for help when making these changes in your home. This information is not intended to replace advice given to you by your health care provider. Make sure you discuss any questions you have with your health care provider. Document Revised: 11/22/2020 Document Reviewed: 09/24/2019 Elsevier Patient Education  [place].

## 2021-08-14 ENCOUNTER — Other Ambulatory Visit: Payer: Self-pay | Admitting: Family Medicine

## 2021-08-31 ENCOUNTER — Encounter (INDEPENDENT_AMBULATORY_CARE_PROVIDER_SITE_OTHER): Payer: Federal, State, Local not specified - PPO | Admitting: Ophthalmology

## 2021-08-31 DIAGNOSIS — H43813 Vitreous degeneration, bilateral: Secondary | ICD-10-CM | POA: Diagnosis not present

## 2021-08-31 DIAGNOSIS — H353231 Exudative age-related macular degeneration, bilateral, with active choroidal neovascularization: Secondary | ICD-10-CM | POA: Diagnosis not present

## 2021-08-31 DIAGNOSIS — H35033 Hypertensive retinopathy, bilateral: Secondary | ICD-10-CM

## 2021-08-31 DIAGNOSIS — I10 Essential (primary) hypertension: Secondary | ICD-10-CM

## 2021-09-01 ENCOUNTER — Telehealth: Payer: Self-pay | Admitting: Family Medicine

## 2021-09-01 MED ORDER — ELIQUIS 2.5 MG PO TABS: 2.5000 mg | ORAL_TABLET | Freq: Two times a day (BID) | ORAL | 1 refills | Status: DC

## 2021-09-01 NOTE — Telephone Encounter (Signed)
  Prescription Request  09/01/2021  Is this a "Controlled Substance" medicine? ELIQUIS 2.5 MG TABS tablet  Have you seen your PCP in the last 2 weeks? 08/08/2021  If YES, route message to pool  -  If NO, patient needs to be scheduled for appointment.  What is the name of the medication or equipment? ELIQUIS 2.5 MG TABS tablet for 90 day supply  Have you contacted your pharmacy to request a refill? no   Which pharmacy would you like this sent to? [facility] mail order   Patient notified that their request is being sent to the clinical staff for review and that they should receive a response within 2 business days.

## 2021-09-01 NOTE — Telephone Encounter (Signed)
LMOVM refill sent into mail order pharmacy

## 2021-09-13 ENCOUNTER — Other Ambulatory Visit: Payer: Self-pay | Admitting: Family Medicine

## 2021-09-28 ENCOUNTER — Encounter (INDEPENDENT_AMBULATORY_CARE_PROVIDER_SITE_OTHER): Payer: Federal, State, Local not specified - PPO | Admitting: Ophthalmology

## 2021-09-28 DIAGNOSIS — H35033 Hypertensive retinopathy, bilateral: Secondary | ICD-10-CM | POA: Diagnosis not present

## 2021-09-28 DIAGNOSIS — H43813 Vitreous degeneration, bilateral: Secondary | ICD-10-CM

## 2021-09-28 DIAGNOSIS — H353231 Exudative age-related macular degeneration, bilateral, with active choroidal neovascularization: Secondary | ICD-10-CM | POA: Diagnosis not present

## 2021-09-28 DIAGNOSIS — I10 Essential (primary) hypertension: Secondary | ICD-10-CM | POA: Diagnosis not present

## 2021-11-02 ENCOUNTER — Encounter (INDEPENDENT_AMBULATORY_CARE_PROVIDER_SITE_OTHER): Payer: Federal, State, Local not specified - PPO | Admitting: Ophthalmology

## 2021-11-02 DIAGNOSIS — H353231 Exudative age-related macular degeneration, bilateral, with active choroidal neovascularization: Secondary | ICD-10-CM

## 2021-11-02 DIAGNOSIS — H35033 Hypertensive retinopathy, bilateral: Secondary | ICD-10-CM

## 2021-11-02 DIAGNOSIS — I10 Essential (primary) hypertension: Secondary | ICD-10-CM

## 2021-11-02 DIAGNOSIS — H43813 Vitreous degeneration, bilateral: Secondary | ICD-10-CM

## 2021-12-06 ENCOUNTER — Encounter (INDEPENDENT_AMBULATORY_CARE_PROVIDER_SITE_OTHER): Payer: Federal, State, Local not specified - PPO | Admitting: Ophthalmology

## 2021-12-06 DIAGNOSIS — I10 Essential (primary) hypertension: Secondary | ICD-10-CM

## 2021-12-06 DIAGNOSIS — H353231 Exudative age-related macular degeneration, bilateral, with active choroidal neovascularization: Secondary | ICD-10-CM

## 2021-12-06 DIAGNOSIS — H35033 Hypertensive retinopathy, bilateral: Secondary | ICD-10-CM | POA: Diagnosis not present

## 2021-12-06 DIAGNOSIS — H43813 Vitreous degeneration, bilateral: Secondary | ICD-10-CM | POA: Diagnosis not present

## 2022-01-12 ENCOUNTER — Encounter (INDEPENDENT_AMBULATORY_CARE_PROVIDER_SITE_OTHER): Payer: Federal, State, Local not specified - PPO | Admitting: Ophthalmology

## 2022-01-12 DIAGNOSIS — I10 Essential (primary) hypertension: Secondary | ICD-10-CM | POA: Diagnosis not present

## 2022-01-12 DIAGNOSIS — H43813 Vitreous degeneration, bilateral: Secondary | ICD-10-CM | POA: Diagnosis not present

## 2022-01-12 DIAGNOSIS — H35033 Hypertensive retinopathy, bilateral: Secondary | ICD-10-CM

## 2022-01-12 DIAGNOSIS — H353231 Exudative age-related macular degeneration, bilateral, with active choroidal neovascularization: Secondary | ICD-10-CM | POA: Diagnosis not present

## 2022-02-01 ENCOUNTER — Other Ambulatory Visit: Payer: Self-pay | Admitting: Family Medicine

## 2022-02-06 ENCOUNTER — Ambulatory Visit (INDEPENDENT_AMBULATORY_CARE_PROVIDER_SITE_OTHER): Payer: Federal, State, Local not specified - PPO | Admitting: Family Medicine

## 2022-02-06 ENCOUNTER — Encounter: Payer: Self-pay | Admitting: Family Medicine

## 2022-02-06 VITALS — BP 138/61 | HR 71 | Temp 97.2°F | Ht 63.0 in | Wt 132.4 lb

## 2022-02-06 DIAGNOSIS — I10 Essential (primary) hypertension: Secondary | ICD-10-CM | POA: Diagnosis not present

## 2022-02-06 DIAGNOSIS — Z23 Encounter for immunization: Secondary | ICD-10-CM

## 2022-02-06 DIAGNOSIS — I480 Paroxysmal atrial fibrillation: Secondary | ICD-10-CM

## 2022-02-06 DIAGNOSIS — E034 Atrophy of thyroid (acquired): Secondary | ICD-10-CM | POA: Diagnosis not present

## 2022-02-06 DIAGNOSIS — N760 Acute vaginitis: Secondary | ICD-10-CM

## 2022-02-06 DIAGNOSIS — B9689 Other specified bacterial agents as the cause of diseases classified elsewhere: Secondary | ICD-10-CM

## 2022-02-06 MED ORDER — METRONIDAZOLE 0.75 % VA GEL: 1.0000 | Freq: Every day | VAGINAL | 1 refills | Status: AC

## 2022-02-06 NOTE — Progress Notes (Signed)
Subjective: CC: Follow-up hypothyroidism, atrial fibrillation PCP: [name], DO ACZ:[id number] Jenna Ramos is a 88 y.o. female presenting to clinic today for:  1.  Hypothyroidism Compliant with Synthroid 75 mcg daily.  No reports of tremor, heart palpitations.  Does occasionally have diarrheal stools.  2.  Atrial fibrillation Compliant with Eliquis, Toprol.  No reports of tachycardia, vaginal bleeding, rectal bleeding.  3.  Vaginal odor Patient reports a fishy vaginal odor without any significant discharge.  No itching or burning.  No dysuria or change in urinary frequency.  This odor waxes and wanes.   ROS: Per HPI  Allergies  Allergen Reactions   Aggrenox [Aspirin-Dipyridamole Er] Anaphylaxis    Severe headache   Codeine Other (See Comments)    Feet burn and agitation   Morphine And Related Other (See Comments)    Feet burn and agitation   Gabapentin     Difficulty concentrating   Past Medical History:  Diagnosis Date   Anemia    takes Iron pill daily   Arthritis    Carotid artery occlusion    Fibromyalgia    Fibromyalgia    Glaucoma    uses Eye Drops daily   HOH (hard of hearing)    Hyperlipidemia    takes Atorvastatin daily   Hypertension    takes Benazepril daily   Hypothyroidism    takes Synthroid daily   Macular degeneration    wet and gets injections in both eyes   Nocturia    Peripheral vascular disease ([place])    Rectal incontinence    Stroke [facility])    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 10/28/2013    Current Outpatient Medications:    Acetaminophen (TYLENOL PO), Take 1-2 tablets by mouth every 6 (six) hours as needed (pain/headache)., Disp: , Rfl:    amLODipine (NORVASC) 10 MG tablet, TAKE 1 TABLET BY MOUTH EVERY DAY, Disp: 90 tablet, Rfl: 0   atorvastatin (LIPITOR) 20 MG tablet, TAKE 1 TABLET BY MOUTH DAILY AT 6 PM., Disp: 90 tablet, Rfl: 0   benazepril (LOTENSIN) 20 MG tablet, TAKE 1 TABLET BY MOUTH EVERY DAY, Disp:  90 tablet, Rfl: 0   Besifloxacin HCl 0.6 % SUSP, Place 1 drop into both eyes See admin instructions. Instill one drop into both eyes 4 times daily on the day of and the day after eye injections - once a month, Disp: , Rfl:    bimatoprost (LUMIGAN) 0.01 % SOLN, Place 1 drop into both eyes at bedtime., Disp: , Rfl:    ELIQUIS 2.5 MG TABS tablet, TAKE 1 TABLET TWICE A DAY, Disp: 180 tablet, Rfl: 0   levothyroxine (SYNTHROID) 75 MCG tablet, TAKE 1 TABLET BY MOUTH DAILY BEFORE BREAKFAST., Disp: 90 tablet, Rfl: 1   metoprolol succinate (TOPROL-XL) 50 MG 24 hr tablet, TAKE 1 TABLET BY MOUTH EVERY DAY, Disp: 90 tablet, Rfl: 0   Multiple Vitamin (MULTIVITAMIN WITH MINERALS) TABS tablet, Take 1 tablet by mouth daily., Disp: , Rfl:    Netarsudil Dimesylate (RHOPRESSA) 0.02 % SOLN, Apply to eye., Disp: , Rfl:  Social History   Socioeconomic History   Marital status: Widowed    Spouse name: Not on file   Number of children: Not on file   Years of education: Not on file   Highest education level: Not on file  Occupational History   Not on file  Tobacco Use   Smoking status: Former   Smokeless tobacco: Never   Tobacco comments:    quit  smoking 38yr ago  Vaping Use   Vaping Use: Never used  Substance and Sexual Activity   Alcohol use: No    Alcohol/week: 0.0 standard drinks of alcohol   Drug use: No   Sexual activity: Yes    Birth control/protection: Post-menopausal  Other Topics Concern   Not on file  Social History Narrative   Not on file   Social Determinants of Health   Financial Resource Strain: Not on file  Food Insecurity: Not on file  Transportation Needs: Not on file  Physical Activity: Not on file  Stress: Not on file  Social Connections: Not on file  Intimate Partner Violence: Not on file   Family History  Problem Relation Age of Onset   Prostate cancer Father    Cancer Father    Lung disease Sister    Cancer Brother     Objective: Office vital signs reviewed. BP  138/61   Pulse 71   Temp (!) 97.2 F (36.2 C)   Ht _0  (1.6 m)   Wt 132 lb 6.4 oz (60.1 kg)   SpO2 98%   BMI 23.45 kg/m   Physical Examination:  General: Awake, alert, nontoxic elderly female, No acute distress HEENT: scler white, MMM.  No exophthalmos Cardio: regular rate and rhythm, S1S2 heard, no murmurs appreciated Pulm: clear to auscultation bilaterally, no wheezes, rhonchi or rales; normal work of breathing on room air Neuro: No tremor  Assessment/ Plan: 88y.o. female   [place] - Plan: Magnesium, CMP14+EGFR, CBC  Hypothyroidism due to acquired atrophy of thyroid - Plan: TSH, T4, Free  Essential hypertension - Plan: CMP14+EGFR  Need for shingles vaccine  Bacterial vaginosis - Plan: metroNIDAZOLE (METROGEL) 0.75 % vaginal gel  Need for immunization against influenza - Plan: Flu Vaccine QUAD High Dose(Fluad)  Rate and rhythm controlled.  Check magnesium, electrolytes, CBC given chronic anticoagulation  Asymptomatic from a thyroid standpoint.  Check thyroid levels  Blood pressure controlled upon recheck.  No changes  Vaginal MetroGel provided for presumed bacterial vaginosis.  Both flu and shingles #1 vaccination administered today  No orders of the defined types were placed in this encounter.  No orders of the defined types were placed in this encounter.    AJanora Norlander DO [place]([phone]

## 2022-02-06 NOTE — Patient Instructions (Signed)

## 2022-02-16 ENCOUNTER — Encounter (INDEPENDENT_AMBULATORY_CARE_PROVIDER_SITE_OTHER): Payer: Federal, State, Local not specified - PPO | Admitting: Ophthalmology

## 2022-02-16 DIAGNOSIS — I10 Essential (primary) hypertension: Secondary | ICD-10-CM | POA: Diagnosis not present

## 2022-02-16 DIAGNOSIS — H43813 Vitreous degeneration, bilateral: Secondary | ICD-10-CM | POA: Diagnosis not present

## 2022-02-16 DIAGNOSIS — H35033 Hypertensive retinopathy, bilateral: Secondary | ICD-10-CM

## 2022-02-16 DIAGNOSIS — H353231 Exudative age-related macular degeneration, bilateral, with active choroidal neovascularization: Secondary | ICD-10-CM | POA: Diagnosis not present

## 2022-03-30 ENCOUNTER — Encounter (INDEPENDENT_AMBULATORY_CARE_PROVIDER_SITE_OTHER): Payer: Federal, State, Local not specified - PPO | Admitting: Ophthalmology

## 2022-03-30 DIAGNOSIS — I10 Essential (primary) hypertension: Secondary | ICD-10-CM | POA: Diagnosis not present

## 2022-03-30 DIAGNOSIS — H35033 Hypertensive retinopathy, bilateral: Secondary | ICD-10-CM

## 2022-03-30 DIAGNOSIS — H353231 Exudative age-related macular degeneration, bilateral, with active choroidal neovascularization: Secondary | ICD-10-CM

## 2022-03-30 DIAGNOSIS — H43813 Vitreous degeneration, bilateral: Secondary | ICD-10-CM | POA: Diagnosis not present

## 2022-05-03 ENCOUNTER — Other Ambulatory Visit: Payer: Self-pay | Admitting: Family Medicine

## 2022-05-05 ENCOUNTER — Telehealth: Payer: Self-pay | Admitting: Family Medicine

## 2022-05-05 ENCOUNTER — Telehealth: Payer: Medicare Other | Admitting: Family Medicine

## 2022-05-05 NOTE — Telephone Encounter (Signed)
Called pt , she stated she reached out to someone else and got a rx

## 2022-05-05 NOTE — Telephone Encounter (Signed)
Jenna Ramos, ok to put her in as a televisit and she can come by and leave a urine.  I can't physically see her today but I'm glad to check her urine and call her with results/ recommendations this afternoon.

## 2022-05-09 ENCOUNTER — Telehealth (INDEPENDENT_AMBULATORY_CARE_PROVIDER_SITE_OTHER): Payer: Federal, State, Local not specified - PPO | Admitting: Family Medicine

## 2022-05-09 ENCOUNTER — Encounter: Payer: Self-pay | Admitting: Family Medicine

## 2022-05-09 VITALS — BP 156/61 | HR 65

## 2022-05-09 DIAGNOSIS — R197 Diarrhea, unspecified: Secondary | ICD-10-CM | POA: Diagnosis not present

## 2022-05-09 DIAGNOSIS — Z87440 Personal history of urinary (tract) infections: Secondary | ICD-10-CM | POA: Diagnosis not present

## 2022-05-09 DIAGNOSIS — R531 Weakness: Secondary | ICD-10-CM

## 2022-05-09 NOTE — Progress Notes (Signed)
Telephone visit  Subjective: CC:UTI PCP: [name], DO UG:[id number] N [place] is a 89 y.o. female calls for telephone consult today. Patient provides verbal consent for consult held via phone.  Due to COVID-19 pandemic this visit was conducted virtually. This visit type was conducted due to national recommendations for restrictions regarding the COVID-19 Pandemic (e.g. social distancing, sheltering in place) in an effort to limit this patient's exposure and mitigate transmission in our community. All issues noted in this document were discussed and addressed.  A physical exam was not performed with this format.   Location of patient: home Location of provider: [facility] Others present for call: none  1. UTI/ Diarrhea Patient reports that she has been having diarrhea for several days.  She had a UTI as well.  She had some old Keflex that resolved the UTI.  She reports loose but non-watery stools.  She denies blood in stool. She just had her first firm stool yesterday. This am stools were loose again.  She has imodium and the diarrhea is not severe.  She's only had 2 BMs today.  She is drinking water and beef broth.  She reports her head feel fuzzy and she feels weak.  No fevers, nausea, vomiting.  ROS: Per HPI  Allergies  Allergen Reactions   Aggrenox [Aspirin-Dipyridamole Er] Anaphylaxis    Severe headache   Codeine Other (See Comments)    Feet burn and agitation   Morphine And Related Other (See Comments)    Feet burn and agitation   Gabapentin     Difficulty concentrating   Past Medical History:  Diagnosis Date   Anemia    takes Iron pill daily   Arthritis    Carotid artery occlusion    Fibromyalgia    Fibromyalgia    Glaucoma    uses Eye Drops daily   HOH (hard of hearing)    Hyperlipidemia    takes Atorvastatin daily   Hypertension    takes Benazepril daily   Hypothyroidism    takes Synthroid daily   Macular degeneration    wet and gets injections in both  eyes   Nocturia    Peripheral vascular disease ([place])    Rectal incontinence    Stroke [facility])    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 10/28/2013    Current Outpatient Medications:    Acetaminophen (TYLENOL PO), Take 1-2 tablets by mouth every 6 (six) hours as needed (pain/headache)., Disp: , Rfl:    amLODipine (NORVASC) 10 MG tablet, TAKE 1 TABLET BY MOUTH EVERY DAY, Disp: 90 tablet, Rfl: 0   atorvastatin (LIPITOR) 20 MG tablet, TAKE 1 TABLET BY MOUTH DAILY AT 6 PM., Disp: 90 tablet, Rfl: 1   benazepril (LOTENSIN) 20 MG tablet, TAKE 1 TABLET BY MOUTH EVERY DAY, Disp: 90 tablet, Rfl: 0   Besifloxacin HCl 0.6 % SUSP, Place 1 drop into both eyes See admin instructions. Instill one drop into both eyes 4 times daily on the day of and the day after eye injections - once a month, Disp: , Rfl:    bimatoprost (LUMIGAN) 0.01 % SOLN, Place 1 drop into both eyes at bedtime., Disp: , Rfl:    ELIQUIS 2.5 MG TABS tablet, TAKE 1 TABLET TWICE A DAY, Disp: 180 tablet, Rfl: 0   levothyroxine (SYNTHROID) 75 MCG tablet, TAKE 1 TABLET BY MOUTH DAILY BEFORE BREAKFAST., Disp: 90 tablet, Rfl: 1   metoprolol succinate (TOPROL-XL) 50 MG 24 hr tablet, TAKE 1 TABLET BY MOUTH EVERY DAY,  Disp: 90 tablet, Rfl: 0   Multiple Vitamin (MULTIVITAMIN WITH MINERALS) TABS tablet, Take 1 tablet by mouth daily., Disp: , Rfl:    Netarsudil Dimesylate (RHOPRESSA) 0.02 % SOLN, Apply to eye., Disp: , Rfl:   Assessment/ Plan: 89 y.o. female   Recent urinary tract infection - Plan: Urinalysis, Routine w reflex microscopic  Diarrhea, unspecified type - Plan: CMP14+EGFR  Weakness - Plan: CMP14+EGFR, CBC, TSH, T4, free  Push fluids/ electrolytes.  Would like to have her come in for labs.  Imodium and probiotics.  Start time: 9:36am End time: 9:48am  Total time spent on patient care (including telephone call/ virtual visit): 12 minutes  [place], [place] [phone]

## 2022-05-11 ENCOUNTER — Encounter (INDEPENDENT_AMBULATORY_CARE_PROVIDER_SITE_OTHER): Payer: Federal, State, Local not specified - PPO | Admitting: Ophthalmology

## 2022-05-11 DIAGNOSIS — H43813 Vitreous degeneration, bilateral: Secondary | ICD-10-CM | POA: Diagnosis not present

## 2022-05-11 DIAGNOSIS — H353231 Exudative age-related macular degeneration, bilateral, with active choroidal neovascularization: Secondary | ICD-10-CM

## 2022-05-11 DIAGNOSIS — H35033 Hypertensive retinopathy, bilateral: Secondary | ICD-10-CM

## 2022-05-11 DIAGNOSIS — I10 Essential (primary) hypertension: Secondary | ICD-10-CM

## 2022-05-17 ENCOUNTER — Other Ambulatory Visit: Payer: Self-pay | Admitting: *Deleted

## 2022-05-17 DIAGNOSIS — I6523 Occlusion and stenosis of bilateral carotid arteries: Secondary | ICD-10-CM

## 2022-05-18 ENCOUNTER — Other Ambulatory Visit: Payer: Self-pay | Admitting: Family Medicine

## 2022-05-30 ENCOUNTER — Telehealth: Payer: Self-pay | Admitting: Family Medicine

## 2022-05-30 MED ORDER — ELIQUIS 2.5 MG PO TABS: 2.5000 mg | ORAL_TABLET | Freq: Two times a day (BID) | ORAL | 0 refills | Status: DC

## 2022-05-30 NOTE — Telephone Encounter (Signed)
Aware refill sent to mail order pharmacy ?

## 2022-05-30 NOTE — Telephone Encounter (Signed)
  Prescription Request  05/30/2022  Is this a "Controlled Substance" medicine? no  Have you seen your PCP in the last 2 weeks? Appt 6/7  If YES, route message to pool  -  If NO, patient needs to be scheduled for appointment.  What is the name of the medication or equipment? ELIQUIS 2.5 MG TABS tablet  Have you contacted your pharmacy to request a refill? yes   Which pharmacy would you like this sent to?  [facility], [place] to Registered [facility] Sites     Patient notified that their request is being sent to the clinical staff for review and that they should receive a response within 2 business days.

## 2022-06-06 ENCOUNTER — Ambulatory Visit (HOSPITAL_COMMUNITY)
Admission: RE | Admit: 2022-06-06 | Discharge: 2022-06-06 | Disposition: A | Discharge status: Home / Self Care | Payer: Federal, State, Local not specified - PPO | Source: Ambulatory Visit | Attending: Vascular Surgery | Admitting: Vascular Surgery

## 2022-06-06 ENCOUNTER — Encounter: Payer: Self-pay | Admitting: Vascular Surgery

## 2022-06-06 ENCOUNTER — Ambulatory Visit: Payer: Federal, State, Local not specified - PPO | Admitting: Vascular Surgery

## 2022-06-06 VITALS — BP 148/63 | HR 67 | Temp 97.6°F | Resp 14 | Ht 63.5 in | Wt 130.0 lb

## 2022-06-06 DIAGNOSIS — I6523 Occlusion and stenosis of bilateral carotid arteries: Secondary | ICD-10-CM

## 2022-06-06 NOTE — Progress Notes (Signed)
Patient name: Jenna Ramos MRN: [id number] DOB: Oct 27, 1928 Sex: female  REASON FOR CONSULT: 2 year follow-up, carotid artery surveillance  HPI: Jenna Ramos is a 89 y.o. female, with multiple medical problems as noted below that presents for 2-year follow-up for surveillance of her carotid artery disease.  She previously underwent a left carotid endarterectomy in 2015 by Dr. Bridgett Larsson for symptomatic stenosis.  She reports no issues since last follow-up including no numbness or weakness in her arm or leg, no vision loss, no other neurologic symptoms.  Denies any recent history of TIA or CVA.  No new concerns today.  Past Medical History:  Diagnosis Date   Anemia    takes Iron pill daily   Arthritis    Carotid artery occlusion    Fibromyalgia    Fibromyalgia    Glaucoma    uses Eye Drops daily   HOH (hard of hearing)    Hyperlipidemia    takes Atorvastatin daily   Hypertension    takes Benazepril daily   Hypothyroidism    takes Synthroid daily   Macular degeneration    wet and gets injections in both eyes   Nocturia    Peripheral vascular disease    Rectal incontinence    Stroke    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 10/28/2013    Past Surgical History:  Procedure Laterality Date   APPENDECTOMY     CAROTID ENDARTERECTOMY     cataract surgery Bilateral    COLONOSCOPY     ENDARTERECTOMY Left 02/19/2014   Procedure: ENDARTERECTOMY CAROTID;  Surgeon: Conrad Steele, MD;  Location[facility];  Service: Vascular;  Laterality: Left;   ESOPHAGOGASTRODUODENOSCOPY     TONSILLECTOMY     TOTAL HIP ARTHROPLASTY Left 10/2013   TUBAL LIGATION     WRIST SURGERY     pins and screws    Family History  Problem Relation Age of Onset   Prostate cancer Father    Cancer Father    Lung disease Sister    Cancer Brother     SOCIAL HISTORY: Social History   Socioeconomic History   Marital status: Widowed    Spouse name: Not on file   Number of children:  Not on file   Years of education: Not on file   Highest education level: Not on file  Occupational History   Not on file  Tobacco Use   Smoking status: Former   Smokeless tobacco: Never   Tobacco comments:    quit smoking 17yrs ago  Vaping Use   Vaping Use: Never used  Substance and Sexual Activity   Alcohol use: No    Alcohol/week: 0.0 standard drinks of alcohol   Drug use: No   Sexual activity: Yes    Birth control/protection: Post-menopausal  Other Topics Concern   Not on file  Social History Narrative   Not on file   Social Determinants of Health   Financial Resource Strain: Not on file  Food Insecurity: Not on file  Transportation Needs: Not on file  Physical Activity: Not on file  Stress: Not on file  Social Connections: Not on file  Intimate Partner Violence: Not on file    Allergies  Allergen Reactions   Aggrenox [Aspirin-Dipyridamole Er] Anaphylaxis    Severe headache   Codeine Other (See Comments)    Feet burn and agitation   Morphine And Related Other (See Comments)    Feet burn and agitation   Gabapentin  Difficulty concentrating    Current Outpatient Medications  Medication Sig Dispense Refill   Acetaminophen (TYLENOL PO) Take 1-2 tablets by mouth every 6 (six) hours as needed (pain/headache).     amLODipine (NORVASC) 10 MG tablet TAKE 1 TABLET BY MOUTH EVERY DAY 90 tablet 0   atorvastatin (LIPITOR) 20 MG tablet TAKE 1 TABLET BY MOUTH DAILY AT 6 PM. 90 tablet 1   benazepril (LOTENSIN) 20 MG tablet TAKE 1 TABLET BY MOUTH EVERY DAY 90 tablet 0   Besifloxacin HCl 0.6 % SUSP Place 1 drop into both eyes See admin instructions. Instill one drop into both eyes 4 times daily on the day of and the day after eye injections - once a month     bimatoprost (LUMIGAN) 0.01 % SOLN Place 1 drop into both eyes at bedtime.     ELIQUIS 2.5 MG TABS tablet Take 1 tablet (2.5 mg total) by mouth 2 (two) times daily. 180 tablet 0   levothyroxine (SYNTHROID) 75 MCG tablet  TAKE 1 TABLET BY MOUTH EVERY DAY BEFORE BREAKFAST 90 tablet 1   metoprolol succinate (TOPROL-XL) 50 MG 24 hr tablet TAKE 1 TABLET BY MOUTH EVERY DAY 90 tablet 0   Multiple Vitamin (MULTIVITAMIN WITH MINERALS) TABS tablet Take 1 tablet by mouth daily.     Netarsudil Dimesylate (RHOPRESSA) 0.02 % SOLN Apply to eye.     No current facility-administered medications for this visit.    REVIEW OF SYSTEMS:  [X]  denotes positive finding, [ ]  denotes negative finding Cardiac  Comments:  Chest pain or chest pressure:    Shortness of breath upon exertion:    Short of breath when lying flat:    Irregular heart rhythm:        Vascular    Pain in calf, thigh, or hip brought on by ambulation:    Pain in feet at night that wakes you up from your sleep:     Blood clot in your veins:    Leg swelling:         Pulmonary    Oxygen at home:    Productive cough:     Wheezing:         Neurologic    Sudden weakness in arms or legs:     Sudden numbness in arms or legs:     Sudden onset of difficulty speaking or slurred speech:    Temporary loss of vision in one eye:     Problems with dizziness:         Gastrointestinal    Blood in stool:     Vomited blood:         Genitourinary    Burning when urinating:     Blood in urine:        Psychiatric    Major depression:         Hematologic    Bleeding problems:    Problems with blood clotting too easily:        Skin    Rashes or ulcers:        Constitutional    Fever or chills:      PHYSICAL EXAM: Vitals:   06/06/22 1130 06/06/22 1133  BP: (!) 147/61 (!) 148/63  Pulse: 67 67  Resp: 14   Temp: 97.6 F (36.4 C)   TempSrc: Temporal   SpO2: 95%   Weight: 130 lb (59 kg)   Height: 5' 3.5" (1.613 m)     GENERAL: The patient is a well-nourished female, in no  acute distress. The vital signs are documented above. CARDIAC: There is a regular rate and rhythm.  VASCULAR: Well healed left neck incision PULMONARY: No respiratory  distress. ABDOMEN: Soft and non-tender. MUSCULOSKELETAL: There are no major deformities or cyanosis. NEUROLOGIC: No focal weakness or paresthesias are detected.  CN II-XII grossly intact SKIN: There are no ulcers or rashes noted. PSYCHIATRIC: The patient has a normal affect.  DATA:   Carotid artery duplex today shows minimal 1 to 39% stenosis bilaterally  Assessment/Plan:  89 year old female presents for 2-year follow-up for surveillance of her carotid artery disease.  As noted above she has previously undergone a left carotid endarterectomy in 2015 by Dr. Bridgett Larsson for symptomatic >70% stenosis.  Her carotid duplex today shows minimal 1 to 39% stenosis bilaterally.  Discussed that in the setting of asymptomatic disease there is no indication for surgical intervention unless greater than 80% stenosis  We will plan to see her again in 2 years with carotid duplex here in the office for suerveillance.  Discussed her carotid US results look great today and call with questions or concerns.   Marty Heck, MD [facility] Office: [phone]

## 2022-06-22 ENCOUNTER — Encounter (INDEPENDENT_AMBULATORY_CARE_PROVIDER_SITE_OTHER): Payer: Federal, State, Local not specified - PPO | Admitting: Ophthalmology

## 2022-06-22 DIAGNOSIS — H353231 Exudative age-related macular degeneration, bilateral, with active choroidal neovascularization: Secondary | ICD-10-CM

## 2022-06-22 DIAGNOSIS — H43813 Vitreous degeneration, bilateral: Secondary | ICD-10-CM | POA: Diagnosis not present

## 2022-06-22 DIAGNOSIS — I10 Essential (primary) hypertension: Secondary | ICD-10-CM

## 2022-06-22 DIAGNOSIS — H35033 Hypertensive retinopathy, bilateral: Secondary | ICD-10-CM | POA: Diagnosis not present

## 2022-08-03 ENCOUNTER — Encounter (INDEPENDENT_AMBULATORY_CARE_PROVIDER_SITE_OTHER): Payer: Federal, State, Local not specified - PPO | Admitting: Ophthalmology

## 2022-08-03 DIAGNOSIS — I10 Essential (primary) hypertension: Secondary | ICD-10-CM

## 2022-08-03 DIAGNOSIS — H353231 Exudative age-related macular degeneration, bilateral, with active choroidal neovascularization: Secondary | ICD-10-CM | POA: Diagnosis not present

## 2022-08-03 DIAGNOSIS — H43813 Vitreous degeneration, bilateral: Secondary | ICD-10-CM

## 2022-08-03 DIAGNOSIS — H35033 Hypertensive retinopathy, bilateral: Secondary | ICD-10-CM | POA: Diagnosis not present

## 2022-08-06 ENCOUNTER — Other Ambulatory Visit: Payer: Self-pay | Admitting: Family Medicine

## 2022-08-09 ENCOUNTER — Encounter: Payer: Medicare Other | Admitting: Family Medicine

## 2022-08-11 ENCOUNTER — Telehealth: Payer: Self-pay | Admitting: Family Medicine

## 2022-08-11 ENCOUNTER — Encounter: Payer: Self-pay | Admitting: Family Medicine

## 2022-08-11 ENCOUNTER — Ambulatory Visit (INDEPENDENT_AMBULATORY_CARE_PROVIDER_SITE_OTHER): Payer: Federal, State, Local not specified - PPO | Admitting: Family Medicine

## 2022-08-11 VITALS — BP 154/61 | HR 69 | Temp 98.9°F | Ht 63.0 in | Wt 130.0 lb

## 2022-08-11 DIAGNOSIS — Z0001 Encounter for general adult medical examination with abnormal findings: Secondary | ICD-10-CM | POA: Diagnosis not present

## 2022-08-11 DIAGNOSIS — I700 Atherosclerosis of aorta: Secondary | ICD-10-CM | POA: Diagnosis not present

## 2022-08-11 DIAGNOSIS — E119 Type 2 diabetes mellitus without complications: Secondary | ICD-10-CM

## 2022-08-11 DIAGNOSIS — R7303 Prediabetes: Secondary | ICD-10-CM

## 2022-08-11 DIAGNOSIS — Z0000 Encounter for general adult medical examination without abnormal findings: Secondary | ICD-10-CM

## 2022-08-11 DIAGNOSIS — I6523 Occlusion and stenosis of bilateral carotid arteries: Secondary | ICD-10-CM

## 2022-08-11 DIAGNOSIS — E1169 Type 2 diabetes mellitus with other specified complication: Secondary | ICD-10-CM

## 2022-08-11 DIAGNOSIS — E1159 Type 2 diabetes mellitus with other circulatory complications: Secondary | ICD-10-CM

## 2022-08-11 DIAGNOSIS — I480 Paroxysmal atrial fibrillation: Secondary | ICD-10-CM | POA: Diagnosis not present

## 2022-08-11 DIAGNOSIS — I152 Hypertension secondary to endocrine disorders: Secondary | ICD-10-CM

## 2022-08-11 DIAGNOSIS — E034 Atrophy of thyroid (acquired): Secondary | ICD-10-CM

## 2022-08-11 DIAGNOSIS — E785 Hyperlipidemia, unspecified: Secondary | ICD-10-CM

## 2022-08-11 LAB — BAYER DCA HB A1C WAIVED: HB A1C (BAYER DCA - WAIVED): 7.6 % — ABNORMAL HIGH (ref 4.8–5.6)

## 2022-08-11 NOTE — Telephone Encounter (Signed)
A1c added ?

## 2022-08-11 NOTE — Telephone Encounter (Signed)
Please add orders for labs - appt on 9/5

## 2022-08-11 NOTE — Telephone Encounter (Signed)
It will just be A1c, she does not have to fast for that.

## 2022-08-11 NOTE — Progress Notes (Signed)
Jenna Ramos is a 89 y.o. female presents to office today for annual physical exam examination.    Concerns today include: 1.  None.  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  Eats pretty much what she wants.  Enjoys sweets, Exercise: No structured Refills needed today: None Immunizations needed: Immunization History  Administered Date(s) Administered   Fluad Quad(high Dose 65+) 11/19/2018, 02/16/2020, 02/04/2021, 02/06/2022   Influenza, High Dose Seasonal PF 11/29/2015, 12/11/2017   Influenza,inj,Quad PF,6+ Mos 12/16/2014, 12/19/2016   Moderna Sars-Covid-2 Vaccination 04/15/2019, 05/13/2019   Pneumococcal Conjugate-13 03/18/2018   Zoster Recombinat (Shingrix) 02/06/2022   Zoster, Live 10/28/2012     Past Medical History:  Diagnosis Date   Anemia    takes Iron pill daily   Arthritis    Carotid artery occlusion    Fibromyalgia    Fibromyalgia    Glaucoma    uses Eye Drops daily   Hip fracture requiring operative repair (HCC) 10/17/2013   HOH (hard of hearing)    Hyperlipidemia    takes Atorvastatin daily   Hypertension    takes Benazepril daily   Hypothyroidism    takes Synthroid daily   Macular degeneration    wet and gets injections in both eyes   Nocturia    Peripheral vascular disease (HCC)    Rectal incontinence    Stroke (HCC)    TIA (transient ischemic attack) 01/23/2014   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 10/28/2013   Social History   Socioeconomic History   Marital status: Widowed    Spouse name: Not on file   Number of children: Not on file   Years of education: Not on file   Highest education level: Not on file  Occupational History   Not on file  Tobacco Use   Smoking status: Former   Smokeless tobacco: Never   Tobacco comments:    quit smoking 50yrs ago  Vaping Use   Vaping Use: Never used  Substance and Sexual  Activity   Alcohol use: No    Alcohol/week: 0.0 standard drinks of alcohol   Drug use: No   Sexual activity: Yes    Birth control/protection: Post-menopausal  Other Topics Concern   Not on file  Social History Narrative   Not on file   Social Determinants of Health   Financial Resource Strain: Not on file  Food Insecurity: Not on file  Transportation Needs: Not on file  Physical Activity: Not on file  Stress: Not on file  Social Connections: Not on file  Intimate Partner Violence: Not on file   Past Surgical History:  Procedure Laterality Date   APPENDECTOMY     CAROTID ENDARTERECTOMY     cataract surgery Bilateral    COLONOSCOPY     ENDARTERECTOMY Left 02/19/2014   Procedure: ENDARTERECTOMY CAROTID;  Surgeon: Fransisco Hertz, MD;  Location: [facility] OR;  Service: Vascular;  Laterality: Left;   ESOPHAGOGASTRODUODENOSCOPY     TONSILLECTOMY     TOTAL HIP ARTHROPLASTY Left 10/2013   TUBAL LIGATION     WRIST SURGERY     pins and screws   Family History  Problem Relation Age of Onset   Prostate cancer Father    Cancer Father    Lung disease Sister    Cancer Brother     Current Outpatient Medications:    Acetaminophen (TYLENOL PO), Take 1-2 tablets by mouth every  6 (six) hours as needed (pain/headache)., Disp: , Rfl:    amLODipine (NORVASC) 10 MG tablet, TAKE 1 TABLET BY MOUTH EVERY DAY, Disp: 90 tablet, Rfl: 0   atorvastatin (LIPITOR) 20 MG tablet, TAKE 1 TABLET BY MOUTH DAILY AT 6 PM., Disp: 90 tablet, Rfl: 1   benazepril (LOTENSIN) 20 MG tablet, TAKE 1 TABLET BY MOUTH EVERY DAY, Disp: 90 tablet, Rfl: 0   Besifloxacin HCl 0.6 % SUSP, Place 1 drop into both eyes See admin instructions. Instill one drop into both eyes 4 times daily on the day of and the day after eye injections - once a month, Disp: , Rfl:    bimatoprost (LUMIGAN) 0.01 % SOLN, Place 1 drop into both eyes at bedtime., Disp: , Rfl:    ELIQUIS 2.5 MG TABS tablet, Take 1 tablet (2.5 mg total) by mouth 2 (two) times  daily., Disp: 180 tablet, Rfl: 0   levothyroxine (SYNTHROID) 75 MCG tablet, TAKE 1 TABLET BY MOUTH EVERY DAY BEFORE BREAKFAST, Disp: 90 tablet, Rfl: 1   metoprolol succinate (TOPROL-XL) 50 MG 24 hr tablet, TAKE 1 TABLET BY MOUTH EVERY DAY, Disp: 90 tablet, Rfl: 0   Multiple Vitamin (MULTIVITAMIN WITH MINERALS) TABS tablet, Take 1 tablet by mouth daily., Disp: , Rfl:    Netarsudil Dimesylate (RHOPRESSA) 0.02 % SOLN, Apply to eye., Disp: , Rfl:   Allergies  Allergen Reactions   Aggrenox [Aspirin-Dipyridamole Er] Anaphylaxis    Severe headache   Codeine Other (See Comments)    Feet burn and agitation   Morphine And Codeine Other (See Comments)    Feet burn and agitation   Gabapentin     Difficulty concentrating     ROS: Review of Systems Pertinent items noted in HPI and remainder of comprehensive ROS otherwise negative.    Physical exam BP (!) 154/61   Pulse 69   Temp 98.9 F (37.2 C)   Ht 5\' 3"  (1.6 m)   Wt 130 lb (59 kg)   SpO2 97%   BMI 23.03 kg/m  General appearance: alert, cooperative, appears stated age, and no distress Head: Normocephalic, without obvious abnormality, atraumatic Eyes: negative findings: lids and lashes normal, conjunctivae and sclerae normal, corneas clear, and pupils equal, round, reactive to light and accomodation Ears: normal TM's and external ear canals both ears Nose: Nares normal. Septum midline. Mucosa normal. No drainage or sinus tenderness. Throat: abnormal findings: poor dentition Neck: no adenopathy, no carotid bruit, supple, symmetrical, trachea midline, and thyroid not enlarged, symmetric, no tenderness/mass/nodules Back: symmetric, no curvature. ROM normal. No CVA tenderness. Lungs: clear to auscultation bilaterally Heart: regular rate and rhythm, S1, S2 normal, no murmur, click, rub or gallop Abdomen: soft, non-tender; bowel sounds normal; no masses,  no organomegaly Extremities: extremities normal, atraumatic, no cyanosis or  edema Pulses: 2+ and symmetric Skin:  Senile purpura and multiple areas of solar lentigo on upper extremities Lymph nodes: Cervical, supraclavicular, and axillary nodes normal. Neurologic: Hard of hearing.  Wears hearing aids  Assessment/ Plan: Jenna Ramos here for annual physical exam.   Annual physical exam  Paroxysmal atrial fibrillation (HCC) - Plan: CMP14+EGFR, CBC, Magnesium  Aortic atherosclerosis (HCC) - Plan: CMP14+EGFR, Lipid Panel  Hyperlipidemia associated with type 2 diabetes mellitus (HCC) - Plan: CMP14+EGFR  Bilateral carotid artery stenosis - Plan: CMP14+EGFR, Lipid Panel  Hypertension associated with diabetes (HCC) - Plan: CMP14+EGFR  Hypothyroidism due to acquired atrophy of thyroid - Plan: CMP14+EGFR, TSH, T4, Free  New onset type 2 diabetes mellitus (HCC) -  Plan: CMP14+EGFR, Bayer DCA Hb A1c Waived  She had a rate and rhythm controlled A-fib.  Check electrolytes, magnesium  Nonfasting lipid collected today.  Found to have new onset type 2 diabetes with A1c of 7.6 today.  We discussed lifestyle modification and she would like to pursue this before starting any medications.  Will plan for diabetic foot exam, urine microalbumin at next visit and she will set up diabetic eye exam at some point  Will send cholesterol labs to her specialist, who is following her for carotid artery stenosis  Check thyroid labs.  Counseled on healthy lifestyle choices, including diet (rich in fruits, vegetables and lean meats and low in salt and simple carbohydrates) and exercise (at least 30 minutes of moderate physical activity daily).  Patient to follow up in 19m  Million Maharaj M. Nadine Counts, DO

## 2022-08-11 NOTE — Patient Instructions (Signed)

## 2022-08-12 LAB — LIPID PANEL
Chol/HDL Ratio: 3.3 ratio (ref 0.0–4.4)
Cholesterol, Total: 142 mg/dL (ref 100–199)
HDL: 43 mg/dL (ref >39)
LDL Chol Calc (NIH): 50 mg/dL (ref 0–99)
Triglycerides: 318 mg/dL — ABNORMAL HIGH (ref 0–149)
VLDL Cholesterol Cal: 49 mg/dL — ABNORMAL HIGH (ref 5–40)

## 2022-08-12 LAB — CBC
Hematocrit: 36.4 % (ref 34.0–46.6)
Hemoglobin: 11.6 g/dL (ref 11.1–15.9)
MCH: 27.4 pg (ref 26.6–33.0)
MCHC: 31.9 g/dL (ref 31.5–35.7)
MCV: 86 fL (ref 79–97)
Platelets: 277 10*3/uL (ref 150–450)
RBC: 4.23 x10E6/uL (ref 3.77–5.28)
RDW: 14.2 % (ref 11.7–15.4)
WBC: 7.4 10*3/uL (ref 3.4–10.8)

## 2022-08-12 LAB — CMP14+EGFR
ALT: 19 IU/L (ref 0–32)
AST: 19 IU/L (ref 0–40)
Albumin/Globulin Ratio: 1.6 (ref 1.2–2.2)
Albumin: 4.4 g/dL (ref 3.6–4.6)
Alkaline Phosphatase: 80 IU/L (ref 44–121)
BUN/Creatinine Ratio: 25 (ref 12–28)
BUN: 21 mg/dL (ref 10–36)
Bilirubin Total: 0.5 mg/dL (ref 0.0–1.2)
CO2: 25 mmol/L (ref 20–29)
Calcium: 9.6 mg/dL (ref 8.7–10.3)
Chloride: 100 mmol/L (ref 96–106)
Creatinine, Ser: 0.83 mg/dL (ref 0.57–1.00)
Globulin, Total: 2.7 g/dL (ref 1.5–4.5)
Glucose: 231 mg/dL — ABNORMAL HIGH (ref 70–99)
Potassium: 4.5 mmol/L (ref 3.5–5.2)
Sodium: 137 mmol/L (ref 134–144)
Total Protein: 7.1 g/dL (ref 6.0–8.5)
eGFR: 66 mL/min/{1.73_m2} (ref >59)

## 2022-08-12 LAB — T4, FREE: Free T4: 1.46 ng/dL (ref 0.82–1.77)

## 2022-08-12 LAB — TSH: TSH: 1.02 u[IU]/mL (ref 0.450–4.500)

## 2022-08-12 LAB — MAGNESIUM: Magnesium: 2 mg/dL (ref 1.6–2.3)

## 2022-08-14 ENCOUNTER — Telehealth: Payer: Self-pay

## 2022-08-14 ENCOUNTER — Telehealth: Payer: Self-pay | Admitting: Family Medicine

## 2022-08-14 NOTE — Telephone Encounter (Signed)
Duplicate note please see previous note

## 2022-08-14 NOTE — Telephone Encounter (Signed)
Spoke with pt, pt having nausea, chills, and overall feeling bad. Explained to pt that shingles shot can cause flu like symptoms for 3-5 days. Pt was not aware that shingles shot could cause this. Pt advised to call back if symptoms worsen or do not improve in 3-5 days.

## 2022-08-15 ENCOUNTER — Other Ambulatory Visit: Payer: Self-pay | Admitting: Family Medicine

## 2022-08-22 ENCOUNTER — Telehealth: Payer: Self-pay | Admitting: Family Medicine

## 2022-08-22 DIAGNOSIS — E119 Type 2 diabetes mellitus without complications: Secondary | ICD-10-CM

## 2022-08-22 NOTE — Telephone Encounter (Signed)
Per DPR, patient notified via voicemail that referral has been placed and she should receive a call to schedule an appointment.  Patient advised to call back if no reply in a week.

## 2022-08-22 NOTE — Telephone Encounter (Signed)
REFERRAL REQUEST Telephone Note  Have you been seen at our office for this problem? yes (Advise that they may need an appointment with their PCP before a referral can be done)  Reason for Referral: Pt is a diabetic and would like to see a dietitian  Referral discussed with patient: yes  Best contact number of patient for referral team: [phone]    Has patient been seen by a specialist for this issue before: no  Patient provider preference for referral: penny [facility]  Patient location preference for referral:    Patient notified that referrals can take up to a week or longer to process. If they haven't heard anything within a week they should call back and speak with the referral department.

## 2022-08-23 ENCOUNTER — Telehealth: Payer: Self-pay | Admitting: *Deleted

## 2022-08-23 DIAGNOSIS — E119 Type 2 diabetes mellitus without complications: Secondary | ICD-10-CM

## 2022-08-23 MED ORDER — ACCU-CHEK GUIDE VI STRP
ORAL_STRIP | 12 refills | Status: DC
Start: 2022-08-23 — End: 2022-10-16

## 2022-08-23 NOTE — Telephone Encounter (Signed)
Pt came in needing script for AccuChek test strips, do not see on med list or in history Please advise on testing frequency, finish pending script [facility]

## 2022-09-14 ENCOUNTER — Encounter (INDEPENDENT_AMBULATORY_CARE_PROVIDER_SITE_OTHER): Payer: Federal, State, Local not specified - PPO | Admitting: Ophthalmology

## 2022-09-14 DIAGNOSIS — I10 Essential (primary) hypertension: Secondary | ICD-10-CM

## 2022-09-14 DIAGNOSIS — H353231 Exudative age-related macular degeneration, bilateral, with active choroidal neovascularization: Secondary | ICD-10-CM | POA: Diagnosis not present

## 2022-09-14 DIAGNOSIS — H35033 Hypertensive retinopathy, bilateral: Secondary | ICD-10-CM | POA: Diagnosis not present

## 2022-09-14 DIAGNOSIS — H43813 Vitreous degeneration, bilateral: Secondary | ICD-10-CM

## 2022-10-02 ENCOUNTER — Telehealth: Payer: Self-pay | Admitting: Family Medicine

## 2022-10-02 MED ORDER — ACCU-CHEK SOFTCLIX LANCETS MISC: 3 refills | Status: DC

## 2022-10-02 NOTE — Telephone Encounter (Signed)
Aware refill sent to pharmacy ?

## 2022-10-02 NOTE — Telephone Encounter (Signed)
  Prescription Request  10/02/2022  Is this a "Controlled Substance" medicine? NO  Have you seen your PCP in the last 2 weeks? NO  If YES, route message to pool  -  If NO, patient needs to be scheduled for appointment.  What is the name of the medication or equipment? Lancets for ACCU-CHEK Guide  Have you contacted your pharmacy to request a refill? NO   Which [facility] [place]   Patient notified that their request is being sent to the clinical staff for review and that they should receive a response within 2 business days.

## 2022-10-10 ENCOUNTER — Telehealth: Payer: Self-pay | Admitting: Family Medicine

## 2022-10-10 NOTE — Telephone Encounter (Signed)
Ok to use claritin 10mg .  If has a rash, then ok to use topical cortisone cream to affected area up to twice daily as well.

## 2022-10-10 NOTE — Telephone Encounter (Signed)
Pt has been notified.

## 2022-10-10 NOTE — Telephone Encounter (Signed)
  Incoming Patient Call  10/10/2022  What symptoms do you have? Lower leg itching mostly on her right leg  How long have you been sick? Gradually two days ago  Have you been seen for this problem? No, pt wants to know what to do to stop itching?   If your provider decides to give you a prescription, which pharmacy would you like for it to be sent to? [facility]   Patient informed that this information will be sent to the clinical staff for review and that they should receive a follow up call.

## 2022-10-13 ENCOUNTER — Other Ambulatory Visit: Payer: Self-pay | Admitting: Family Medicine

## 2022-10-14 ENCOUNTER — Other Ambulatory Visit: Payer: Self-pay | Admitting: Family Medicine

## 2022-10-14 DIAGNOSIS — E119 Type 2 diabetes mellitus without complications: Secondary | ICD-10-CM

## 2022-10-25 ENCOUNTER — Encounter: Payer: Federal, State, Local not specified - PPO | Attending: Family Medicine | Admitting: Nutrition

## 2022-10-25 VITALS — Ht 63.0 in | Wt 119.0 lb

## 2022-10-25 DIAGNOSIS — I10 Essential (primary) hypertension: Secondary | ICD-10-CM | POA: Diagnosis present

## 2022-10-25 DIAGNOSIS — I779 Disorder of arteries and arterioles, unspecified: Secondary | ICD-10-CM | POA: Insufficient documentation

## 2022-10-25 DIAGNOSIS — E782 Mixed hyperlipidemia: Secondary | ICD-10-CM | POA: Diagnosis present

## 2022-10-25 DIAGNOSIS — E118 Type 2 diabetes mellitus with unspecified complications: Secondary | ICD-10-CM | POA: Insufficient documentation

## 2022-10-25 NOTE — Progress Notes (Signed)
Medical Nutrition Therapy  Appointment Start time:  1300  Appointment End time:  1400  Primary concerns today: Type 2 Dm  Referral diagnosis: E11.8 Preferred learning style: no preference  Learning readiness: Ready    NUTRITION ASSESSMENT  89 yr old wfemale referred by Dr. Nadine Counts for Type 2 DM. A1C 7.6%. BS 90-130's She reports liking her sweets.  She is willing to work on eating more whole plant based foods and making changes to improve and reverse her Type 2 DM.  Wants to control her DM with diet and lifestyle changes and not with medication.  Anthropometrics  Wt Readings from Last 3 Encounters:  11/13/22 119 lb (54 kg)  10/25/22 119 lb (54 kg)  08/11/22 130 lb (59 kg)   Ht Readings from Last 3 Encounters:  11/13/22 5\' 3"  (1.6 m)  10/25/22 5\' 3"  (1.6 m)  08/11/22 5\' 3"  (1.6 m)   Body mass index is 21.08 kg/m. @BMIFA @ Facility age limit for growth %iles is 20 years. Facility age limit for growth %iles is 20 years.    Clinical Medical Hx: CAD, HTN, Atherosclerosis, Hyperlipidemia, Aniemia, Macular Degeneration of both eyes, Afib Medications: See chart Labs:     Latest Ref Rng & Units 11/09/2022    9:27 AM 08/11/2022   11:09 AM 06/16/2020    1:37 PM  CMP  Glucose 70 - 99 mg/dL 308  657  846   BUN 10 - 36 mg/dL 27  21  19    Creatinine 0.57 - 1.00 mg/dL 9.62  9.52  8.41   Sodium 134 - 144 mmol/L 138  137  139   Potassium 3.5 - 5.2 mmol/L 4.7  4.5  4.4   Chloride 96 - 106 mmol/L 101  100  98   CO2 20 - 29 mmol/L 24  25  22    Calcium 8.7 - 10.3 mg/dL 32.4  9.6  9.9   Total Protein 6.0 - 8.5 g/dL 7.4  7.1  7.1   Total Bilirubin 0.0 - 1.2 mg/dL 0.6  0.5  0.4   Alkaline Phos 44 - 121 IU/L 84  80  80   AST 0 - 40 IU/L 23  19  16    ALT 0 - 32 IU/L 16  19  14     Lipid Panel     Component Value Date/Time   CHOL 142 08/11/2022 1109   TRIG 318 (H) 08/11/2022 1109   HDL 43 08/11/2022 1109   CHOLHDL 3.3 08/11/2022 1109   CHOLHDL 5.3 12/04/2017 0716   VLDL 71 (H)  12/04/2017 0716   LDLCALC 50 08/11/2022 1109   LABVLDL 49 (H) 08/11/2022 1109   Lab Results  Component Value Date   HGBA1C 6.1 (H) 11/09/2022    Notable Signs/Symptoms: None  Lifestyle & Dietary Hx Widow. Lives by [employer] out often due to not cooking for herself much  Estimated daily fluid intake: 30 oz Supplements:  Sleep: good Stress / self-care:  Current average weekly physical activity: ADL  24-Hr Dietary Recall Eats 2-3 meals per day  Estimated Energy Needs Calories: 1200-1500 Carbohydrate: 135g Protein: 90g Fat: 33g   NUTRITION DIAGNOSIS  NB-1.1 Food and nutrition-related knowledge deficit As related to Diabetes Type 2.  As evidenced by A1c 7.6%.   NUTRITION INTERVENTION  Nutrition education (E-1) on the following topics:  Nutrition and Diabetes education provided on My Plate, CHO counting, meal planning, portion sizes, timing of meals, avoiding snacks between meals unless having a low blood sugar, target ranges for A1C  and blood sugars, signs/symptoms and treatment of hyper/hypoglycemia, monitoring blood sugars, taking medications as prescribed, benefits of exercising 30 minutes per day and prevention of complications of DM.  Lifestyle Medicine  - Whole Food, Plant Predominant Nutrition is highly recommended: Eat Plenty of vegetables, Mushrooms, fruits, Legumes, Whole Grains, Nuts, seeds in lieu of processed meats, processed snacks/pastries red meat, poultry, eggs.    -It is better to avoid simple carbohydrates including: Cakes, Sweet Desserts, Ice Cream, Soda (diet and regular), Sweet Tea, Candies, Chips, Cookies, Store Bought Juices, Alcohol in Excess of  1-2 drinks a day, Lemonade,  Artificial Sweeteners, Doughnuts, Coffee Creamers, "Sugar-free" Products, etc, etc.  This is not a complete list.....  Exercise: If you are able: 30 -60 minutes a day ,4 days a week, or 150 minutes a week.  The longer the better.  Combine stretch, strength, and aerobic  activities.  If you were told in the past that you have high risk for cardiovascular diseases, you may seek evaluation by your heart doctor prior to initiating moderate to intense exercise programs.   Handouts Provided Include  Lifestyle Medicine handouts   BS log sheets Learning Style & Readiness for Change Teaching method utilized: Visual & Auditory  Demonstrated degree of understanding via: Teach Back  Barriers to learning/adherence to lifestyle change: None  Goals Established by Pt Goals  Eat three meals per day ; mostly plant based whole foods Drink 3-4 bottles of water per day Cut out sweets and processed foods Increase high fiber foods Walk 15-30 minutes  a day. Get A1C down to 6.5% or below Test blood sugars 4-5 times per week   MONITORING & EVALUATION Dietary intake, weekly physical activity, and blood sugars in 1-2 months.  Next Steps  Patient is to work on eating more whole plant based foods.Marland Kitchen

## 2022-10-26 ENCOUNTER — Encounter (INDEPENDENT_AMBULATORY_CARE_PROVIDER_SITE_OTHER): Payer: Federal, State, Local not specified - PPO | Admitting: Ophthalmology

## 2022-10-26 DIAGNOSIS — H43813 Vitreous degeneration, bilateral: Secondary | ICD-10-CM

## 2022-10-26 DIAGNOSIS — H35033 Hypertensive retinopathy, bilateral: Secondary | ICD-10-CM

## 2022-10-26 DIAGNOSIS — I10 Essential (primary) hypertension: Secondary | ICD-10-CM | POA: Diagnosis not present

## 2022-10-26 DIAGNOSIS — H353231 Exudative age-related macular degeneration, bilateral, with active choroidal neovascularization: Secondary | ICD-10-CM

## 2022-11-09 ENCOUNTER — Other Ambulatory Visit: Payer: Federal, State, Local not specified - PPO

## 2022-11-09 DIAGNOSIS — R531 Weakness: Secondary | ICD-10-CM

## 2022-11-09 DIAGNOSIS — Z87440 Personal history of urinary (tract) infections: Secondary | ICD-10-CM

## 2022-11-09 DIAGNOSIS — R7303 Prediabetes: Secondary | ICD-10-CM

## 2022-11-09 DIAGNOSIS — R197 Diarrhea, unspecified: Secondary | ICD-10-CM

## 2022-11-09 LAB — URINALYSIS, ROUTINE W REFLEX MICROSCOPIC
Bilirubin, UA: NEGATIVE
Glucose, UA: NEGATIVE
Ketones, UA: NEGATIVE
Leukocytes,UA: NEGATIVE
Nitrite, UA: NEGATIVE
Protein,UA: NEGATIVE
RBC, UA: NEGATIVE
Specific Gravity, UA: 1.01 (ref 1.005–1.030)
Urobilinogen, Ur: 0.2 mg/dL (ref 0.2–1.0)
pH, UA: 5.5 (ref 5.0–7.5)

## 2022-11-09 LAB — BAYER DCA HB A1C WAIVED: HB A1C (BAYER DCA - WAIVED): 6.1 % — ABNORMAL HIGH (ref 4.8–5.6)

## 2022-11-10 LAB — CMP14+EGFR
ALT: 16 IU/L (ref 0–32)
AST: 23 IU/L (ref 0–40)
Albumin: 4.4 g/dL (ref 3.6–4.6)
Alkaline Phosphatase: 84 IU/L (ref 44–121)
BUN/Creatinine Ratio: 28 (ref 12–28)
BUN: 27 mg/dL (ref 10–36)
Bilirubin Total: 0.6 mg/dL (ref 0.0–1.2)
CO2: 24 mmol/L (ref 20–29)
Calcium: 10.4 mg/dL — ABNORMAL HIGH (ref 8.7–10.3)
Chloride: 101 mmol/L (ref 96–106)
Creatinine, Ser: 0.96 mg/dL (ref 0.57–1.00)
Globulin, Total: 3 g/dL (ref 1.5–4.5)
Glucose: 135 mg/dL — ABNORMAL HIGH (ref 70–99)
Potassium: 4.7 mmol/L (ref 3.5–5.2)
Sodium: 138 mmol/L (ref 134–144)
Total Protein: 7.4 g/dL (ref 6.0–8.5)
eGFR: 56 mL/min/{1.73_m2} — ABNORMAL LOW (ref >59)

## 2022-11-10 LAB — CBC
Hematocrit: 41.7 % (ref 34.0–46.6)
Hemoglobin: 12.9 g/dL (ref 11.1–15.9)
MCH: 27.9 pg (ref 26.6–33.0)
MCHC: 30.9 g/dL — ABNORMAL LOW (ref 31.5–35.7)
MCV: 90 fL (ref 79–97)
Platelets: 327 10*3/uL (ref 150–450)
RBC: 4.63 x10E6/uL (ref 3.77–5.28)
RDW: 13.6 % (ref 11.7–15.4)
WBC: 7.9 10*3/uL (ref 3.4–10.8)

## 2022-11-10 LAB — T4, FREE: Free T4: 1.53 ng/dL (ref 0.82–1.77)

## 2022-11-10 LAB — TSH: TSH: 1.4 u[IU]/mL (ref 0.450–4.500)

## 2022-11-13 ENCOUNTER — Ambulatory Visit (INDEPENDENT_AMBULATORY_CARE_PROVIDER_SITE_OTHER): Payer: Federal, State, Local not specified - PPO | Admitting: Family Medicine

## 2022-11-13 ENCOUNTER — Other Ambulatory Visit: Payer: Self-pay | Admitting: Family Medicine

## 2022-11-13 ENCOUNTER — Encounter: Payer: Self-pay | Admitting: Family Medicine

## 2022-11-13 ENCOUNTER — Ambulatory Visit (INDEPENDENT_AMBULATORY_CARE_PROVIDER_SITE_OTHER): Payer: Medicare Other

## 2022-11-13 VITALS — BP 150/68 | HR 73 | Ht 63.0 in | Wt 119.0 lb

## 2022-11-13 DIAGNOSIS — R059 Cough, unspecified: Secondary | ICD-10-CM

## 2022-11-13 DIAGNOSIS — E034 Atrophy of thyroid (acquired): Secondary | ICD-10-CM

## 2022-11-13 DIAGNOSIS — R0781 Pleurodynia: Secondary | ICD-10-CM

## 2022-11-13 DIAGNOSIS — R7989 Other specified abnormal findings of blood chemistry: Secondary | ICD-10-CM

## 2022-11-13 DIAGNOSIS — R911 Solitary pulmonary nodule: Secondary | ICD-10-CM

## 2022-11-13 DIAGNOSIS — I10 Essential (primary) hypertension: Secondary | ICD-10-CM | POA: Diagnosis not present

## 2022-11-13 DIAGNOSIS — I480 Paroxysmal atrial fibrillation: Secondary | ICD-10-CM

## 2022-11-13 MED ORDER — BENAZEPRIL HCL 20 MG PO TABS: 20.0000 mg | ORAL_TABLET | Freq: Every day | ORAL | 3 refills | Status: DC

## 2022-11-13 MED ORDER — ELIQUIS 2.5 MG PO TABS: 2.5000 mg | ORAL_TABLET | Freq: Two times a day (BID) | ORAL | 3 refills | Status: DC

## 2022-11-13 MED ORDER — METOPROLOL SUCCINATE ER 50 MG PO TB24: 50.0000 mg | ORAL_TABLET | Freq: Every day | ORAL | 3 refills | Status: DC

## 2022-11-13 MED ORDER — METOPROLOL SUCCINATE ER 50 MG PO TB24
50.0000 mg | ORAL_TABLET | Freq: Every day | ORAL | 0 refills | Status: DC
Start: 2022-11-13 — End: 2022-11-30

## 2022-11-13 MED ORDER — IPRATROPIUM-ALBUTEROL 0.5-2.5 (3) MG/3ML IN SOLN
3.0000 mL | Freq: Once | RESPIRATORY_TRACT | Status: DC
Start: 2022-11-13 — End: 2023-11-13

## 2022-11-13 MED ORDER — AMLODIPINE BESYLATE 10 MG PO TABS: 10.0000 mg | ORAL_TABLET | Freq: Every day | ORAL | 3 refills | Status: DC

## 2022-11-13 NOTE — Patient Instructions (Signed)
Ok to stop the statin.

## 2022-11-13 NOTE — Progress Notes (Signed)
Subjective: CC: Chronic checkup PCP: Raliegh Ip, DO NUU:VOZDGUY N Bertoni is a 89 y.o. female presenting to clinic today for:  1.  Fall Patient is accompanied today by her son.  She notes that she fell last evening.  Notes that the right lower extremity just gave out on her.  She has had issues with this in the past.  She does have a walker but does not use it regularly.  She did hurt the left side of her ribs.  Does not feel as bad as the last time she fell.  She has had a cough since she had her blood work last week.  No reports of fevers, hemoptysis, night sweats or shortness of breath or wheezing.  Has not taken a COVID test at home and has no known exposure to COVID except for maybe here in the office.  She needs her metoprolol renewed.  Wondering if perhaps she can come off of her atorvastatin given her advanced age.  She had a conversation with a nutritionist and they felt that perhaps it was not necessary at this age.  She would like to minimize medication where able.   ROS: Per HPI  Allergies  Allergen Reactions   Aggrenox [Aspirin-Dipyridamole Er] Anaphylaxis    Severe headache   Codeine Other (See Comments)    Feet burn and agitation   Morphine And Codeine Other (See Comments)    Feet burn and agitation   Gabapentin     Difficulty concentrating   Past Medical History:  Diagnosis Date   Anemia    takes Iron pill daily   Arthritis    Carotid artery occlusion    Fibromyalgia    Fibromyalgia    Glaucoma    uses Eye Drops daily   Hip fracture requiring operative repair (HCC) 10/17/2013   HOH (hard of hearing)    Hyperlipidemia    takes Atorvastatin daily   Hypertension    takes Benazepril daily   Hypothyroidism    takes Synthroid daily   Macular degeneration    wet and gets injections in both eyes   Nocturia    Peripheral vascular disease (HCC)    Rectal incontinence    Stroke (HCC)    TIA (transient ischemic attack) 01/23/2014   UTI (urinary  tract infection) due to Morganella Morganii 10/28/2013    Current Outpatient Medications:    Accu-Chek Softclix Lancets lancets, Dx E11.9 Test BS daily, Disp: 100 each, Rfl: 3   Acetaminophen (TYLENOL PO), Take 1-2 tablets by mouth every 6 (six) hours as needed (pain/headache)., Disp: , Rfl:    amLODipine (NORVASC) 10 MG tablet, TAKE 1 TABLET BY MOUTH EVERY DAY, Disp: 90 tablet, Rfl: 0   atorvastatin (LIPITOR) 20 MG tablet, TAKE 1 TABLET BY MOUTH DAILY AT 6 PM., Disp: 90 tablet, Rfl: 0   benazepril (LOTENSIN) 20 MG tablet, TAKE 1 TABLET BY MOUTH EVERY DAY, Disp: 90 tablet, Rfl: 0   Besifloxacin HCl 0.6 % SUSP, Place 1 drop into both eyes See admin instructions. Instill one drop into both eyes 4 times daily on the day of and the day after eye injections - once a month, Disp: , Rfl:    bimatoprost (LUMIGAN) 0.01 % SOLN, Place 1 drop into both eyes at bedtime., Disp: , Rfl:    ELIQUIS 2.5 MG TABS tablet, TAKE 1 TABLET TWICE A DAY, Disp: 180 tablet, Rfl: 0   glucose blood (ACCU-CHEK GUIDE) test strip, USE TO TEST BLOOD SUGAR BID DX E11.9, Disp:  200 strip, Rfl: 3   levothyroxine (SYNTHROID) 75 MCG tablet, TAKE 1 TABLET BY MOUTH EVERY DAY BEFORE BREAKFAST, Disp: 90 tablet, Rfl: 2   metoprolol succinate (TOPROL-XL) 50 MG 24 hr tablet, TAKE 1 TABLET BY MOUTH EVERY DAY, Disp: 90 tablet, Rfl: 0   Multiple Vitamin (MULTIVITAMIN WITH MINERALS) TABS tablet, Take 1 tablet by mouth daily., Disp: , Rfl:    Netarsudil Dimesylate (RHOPRESSA) 0.02 % SOLN, Apply to eye., Disp: , Rfl:  Social History   Socioeconomic History   Marital status: Widowed    Spouse name: Not on file   Number of children: Not on file   Years of education: Not on file   Highest education level: Not on file  Occupational History   Not on file  Tobacco Use   Smoking status: Former   Smokeless tobacco: Never   Tobacco comments:    quit smoking 54yrs ago  Vaping Use   Vaping status: Never Used  Substance and Sexual Activity    Alcohol use: No    Alcohol/week: 0.0 standard drinks of alcohol   Drug use: No   Sexual activity: Yes    Birth control/protection: Post-menopausal  Other Topics Concern   Not on file  Social History Narrative   Not on file   Social Determinants of Health   Financial Resource Strain: Not on file  Food Insecurity: Not on file  Transportation Needs: Not on file  Physical Activity: Not on file  Stress: Not on file  Social Connections: Not on file  Intimate Partner Violence: Not on file   Family History  Problem Relation Age of Onset   Prostate cancer Father    Cancer Father    Lung disease Sister    Cancer Brother     Objective: Office vital signs reviewed. BP (!) 160/62   Pulse 73   Ht 5\' 3"  (1.6 m)   Wt 119 lb (54 kg)   SpO2 96%   BMI 21.08 kg/m   Physical Examination:  General: Awake, alert, chronically ill-appearing elderly female, No acute distress HEENT: Sclera white.  Moist mucous membranes Cardio: regular rate and rhythm, S1S2 heard, no murmurs appreciated Pulm: Coarse breath sounds noted in the right lower lung.  No wheezes, rhonchi or rails otherwise.  Normal work of breathing on room air MSK: Arrives in wheelchair today.  Mildly tender over the left lateral lower ribs.  There are no deformities or palpable ecchymosis in this area  DG Chest 2 View  Result Date: 11/13/2022 CLINICAL DATA:  Coarse breath sounds in the right lower lobe. EXAM: CHEST - 2 VIEW COMPARISON:  07/07/2019 FINDINGS: Interval 2 cm faint, oval nodular density overlying the left lower lung zone on the frontal view. The remainder of the lungs are clear. Stable mildly elevated left hemidiaphragm. Tortuous and partially calcified thoracic aorta. Borderline enlarged cardiac silhouette. Mild thoracic and upper lumbar spine degenerative changes. IMPRESSION: 1. Interval 2 cm faint nodular density overlying the left lower lung zone. Chest CT is recommended for further evaluation. 2. No acute abnormality.  3. These results will be called to the ordering clinician or representative by the Radiologist Assistant, and communication documented in the PACS or [employer]. Electronically Signed   By: Beckie Salts M.D.   On: 11/13/2022 15:14    Assessment/ Plan: 89 y.o. female   Essential hypertension  Hypothyroidism due to acquired atrophy of thyroid  Cough in adult - Plan: DG Chest 2 View, COVID-19, Flu A+B and RSV, ipratropium-albuterol (DUONEB) 0.5-2.5 (  3) MG/3ML nebulizer solution 3 mL  Rib pain on left side - Plan: DG Chest 2 View  Paroxysmal atrial fibrillation (HCC) - Plan: metoprolol succinate (TOPROL-XL) 50 MG 24 hr tablet  Elevated serum creatinine - Plan: Basic metabolic panel  Solitary pulmonary nodule - Plan: CT Chest Wo Contrast  Blood pressure not controlled on initial check.  We reviewed her thyroid labs and other labs in detail today.  She had a small rise in creatinine and I like her to have a repeat BMP in the next couple of weeks.  She will schedule  Plain films of the ribs collected and I did not appreciate any gross fracture upon personal review.  Radiology review actually demonstrates a nodule along the left, this is not where we heard the coarse breath sounds.  Will obtain CAT scan to further investigate  Metoprolol sent to mail order as well as local pharmacy for bridge supply   Agnieszka Newhouse Hulen Skains, DO [facility] [phone]

## 2022-11-14 LAB — COVID-19, FLU A+B AND RSV
Influenza A, NAA: NOT DETECTED
Influenza B, NAA: NOT DETECTED
RSV, NAA: NOT DETECTED
SARS-CoV-2, NAA: DETECTED — AB

## 2022-11-16 ENCOUNTER — Encounter: Payer: Self-pay | Admitting: Nutrition

## 2022-11-16 NOTE — Patient Instructions (Signed)
Goals  Eat three meals per day ; mostly plant based whole foods Drink 3-4 bottles of water per day Cut out sweets and processed foods Increase high fiber foods Walk 15-30 minutes  a day. Get A1C down to 6.5% or below Test blood sugars 4-5 times per week

## 2022-11-24 ENCOUNTER — Ambulatory Visit (INDEPENDENT_AMBULATORY_CARE_PROVIDER_SITE_OTHER): Payer: Federal, State, Local not specified - PPO | Admitting: Nurse Practitioner

## 2022-11-24 ENCOUNTER — Encounter: Payer: Self-pay | Admitting: Nurse Practitioner

## 2022-11-24 VITALS — BP 169/54 | HR 59 | Temp 97.3°F | Resp 20 | Ht 63.0 in | Wt 116.0 lb

## 2022-11-24 DIAGNOSIS — M546 Pain in thoracic spine: Secondary | ICD-10-CM | POA: Diagnosis not present

## 2022-11-24 MED ORDER — METHYLPREDNISOLONE ACETATE 80 MG/ML IJ SUSP
80.0000 mg | Freq: Once | INTRAMUSCULAR | Status: AC
Start: 2022-11-24 — End: 2022-11-24
  Administered 2022-11-24: 80 mg via INTRAMUSCULAR

## 2022-11-24 MED ORDER — PREDNISONE 20 MG PO TABS
ORAL_TABLET | ORAL | 0 refills | Status: DC
Start: 2022-11-24 — End: 2023-08-23

## 2022-11-24 NOTE — Progress Notes (Signed)
Subjective:    Patient ID: Jenna Ramos, female    DOB: [birth year], 89 y.o.   MRN: [id number]   Chief Complaint: Back Pain (Left side/)   Back Pain Pertinent negatives include no abdominal pain, chest pain, headaches or weakness.     Patient feel about 1 week ago. She hit the back of her head. Went to ED and everything was normal. Now she is c/o of back pain. Pain is intermittent. Rates pain 8-9/10 when it grabs her. Pain is in lower back. Worse with standing or walking.   Patient Active Problem List   Diagnosis Date Noted   Macular degeneration of both eyes 02/16/2020   Atrial fibrillation (HCC) 07/04/2018   Aortic atherosclerosis (HCC) 07/01/2018   Iron deficiency anemia 12/03/2017   Pre-diabetes 12/19/2016   Hypothyroidism    HLD (hyperlipidemia)    Essential hypertension 05/04/2014   Carotid artery disease (HCC) 01/14/2014   Fibromyalgia muscle pain 10/28/2013       Review of Systems  Constitutional:  Negative for diaphoresis.  Eyes:  Negative for pain.  Respiratory:  Negative for shortness of breath.   Cardiovascular:  Negative for chest pain, palpitations and leg swelling.  Gastrointestinal:  Negative for abdominal pain.  Endocrine: Negative for polydipsia.  Musculoskeletal:  Positive for back pain.  Skin:  Negative for rash.  Neurological:  Negative for dizziness, weakness and headaches.  Hematological:  Does not bruise/bleed easily.  All other systems reviewed and are negative.      Objective:   Physical Exam Constitutional:      Appearance: Normal appearance.  Cardiovascular:     Rate and Rhythm: Normal rate and regular rhythm.     Heart sounds: Normal heart sounds.  Pulmonary:     Effort: Pulmonary effort is normal.     Breath sounds: Normal breath sounds.  Musculoskeletal:     Comments: Pain in thoracic area on left. Pain when flexing or extending area. Sore to touch. (-) SLR Motor strength and sensation distally intact  Skin:     General: Skin is warm.  Neurological:     General: No focal deficit present.     Mental Status: She is alert and oriented to person, place, and time.  Psychiatric:        Mood and Affect: Mood normal.        Behavior: Behavior normal.    BP (!) 169/54   Pulse (!) 59   Temp (!) 97.3 F (36.3 C) (Temporal)   Resp 20   Ht 5\' 3"  (1.6 m)   Wt 116 lb (52.6 kg)   SpO2 99%   BMI 20.55 kg/m         Assessment & Plan:   Jenna Ramos in today with chief complaint of Back Pain (Left side/)   1. Acute left-sided thoracic back pain Moist heat Rest RTO prn Salpona patch - methylPREDNISolone acetate (DEPO-MEDROL) injection 80 mg - predniSONE (DELTASONE) 20 MG tablet; 2 po at sametime daily for 5 days-  Dispense: 10 tablet; Refill: 0    The above assessment and management plan was discussed with the patient. The patient verbalized understanding of and has agreed to the management plan. Patient is aware to call the clinic if symptoms persist or worsen. Patient is aware when to return to the clinic for a follow-up visit. Patient educated on when it is appropriate to go to the emergency department.   Mary-Margaret Daphine Deutscher, FNP

## 2022-11-24 NOTE — Patient Instructions (Signed)
Acute Back Pain, Adult Acute back pain is sudden and usually short-lived. It is often caused by an injury to the muscles and tissues in the back. The injury may result from: A muscle, tendon, or ligament getting overstretched or torn. Ligaments are tissues that connect bones to each other. Lifting something improperly can cause a back strain. Wear and tear (degeneration) of the spinal disks. Spinal disks are circular tissue that provide cushioning between the bones of the spine (vertebrae). Twisting motions, such as while playing sports or doing yard work. A hit to the back. Arthritis. You may have a physical exam, lab tests, and imaging tests to find the cause of your pain. Acute back pain usually goes away with rest and home care. Follow these instructions at home: Managing pain, stiffness, and swelling Take over-the-counter and prescription medicines only as told by your health care provider. Treatment may include medicines for pain and inflammation that are taken by mouth or applied to the skin, or muscle relaxants. Your health care provider may recommend applying ice during the first 24-48 hours after your pain starts. To do this: Put ice in a plastic bag. Place a towel between your skin and the bag. Leave the ice on for 20 minutes, 2-3 times a day. Remove the ice if your skin turns bright red. This is very important. If you cannot feel pain, heat, or cold, you have a greater risk of damage to the area. If directed, apply heat to the affected area as often as told by your health care provider. Use the heat source that your health care provider recommends, such as a moist heat pack or a heating pad. Place a towel between your skin and the heat source. Leave the heat on for 20-30 minutes. Remove the heat if your skin turns bright red. This is especially important if you are unable to feel pain, heat, or cold. You have a greater risk of getting burned. Activity  Do not stay in bed. Staying in  bed for more than 1-2 days can delay your recovery. Sit up and stand up straight. Avoid leaning forward when you sit or hunching over when you stand. If you work at a desk, sit close to it so you do not need to lean over. Keep your chin tucked in. Keep your neck drawn back, and keep your elbows bent at a 90-degree angle (right angle). Sit high and close to the steering wheel when you drive. Add lower back (lumbar) support to your car seat, if needed. Take short walks on even surfaces as soon as you are able. Try to increase the length of time you walk each day. Do not sit, drive, or stand in one place for more than 30 minutes at a time. Sitting or standing for long periods of time can put stress on your back. Do not drive or use heavy machinery while taking prescription pain medicine. Use proper lifting techniques. When you bend and lift, use positions that put less stress on your back: Bend your knees. Keep the load close to your body. Avoid twisting. Exercise regularly as told by your health care provider. Exercising helps your back heal faster and helps prevent back injuries by keeping muscles strong and flexible. Work with a physical therapist to make a safe exercise program, as recommended by your health care provider. Do any exercises as told by your physical therapist. Lifestyle Maintain a healthy weight. Extra weight puts stress on your back and makes it difficult to have good   posture. Avoid activities or situations that make you feel anxious or stressed. Stress and anxiety increase muscle tension and can make back pain worse. Learn ways to manage anxiety and stress, such as through exercise. General instructions Sleep on a firm mattress in a comfortable position. Try lying on your side with your knees slightly bent. If you lie on your back, put a pillow under your knees. Keep your head and neck in a straight line with your spine (neutral position) when using electronic equipment like  smartphones or pads. To do this: Raise your smartphone or pad to look at it instead of bending your head or neck to look down. Put the smartphone or pad at the level of your face while looking at the screen. Follow your treatment plan as told by your health care provider. This may include: Cognitive or behavioral therapy. Acupuncture or massage therapy. Meditation or yoga. Contact a health care provider if: You have pain that is not relieved with rest or medicine. You have increasing pain going down into your legs or buttocks. Your pain does not improve after 2 weeks. You have pain at night. You lose weight without trying. You have a fever or chills. You develop nausea or vomiting. You develop abdominal pain. Get help right away if: You develop new bowel or bladder control problems. You have unusual weakness or numbness in your arms or legs. You feel faint. These symptoms may represent a serious problem that is an emergency. Do not wait to see if the symptoms will go away. Get medical help right away. Call your local emergency services (911 in the [place] Do not drive yourself to the hospital. Summary Acute back pain is sudden and usually short-lived. Use proper lifting techniques. When you bend and lift, use positions that put less stress on your back. Take over-the-counter and prescription medicines only as told by your health care provider, and apply heat or ice as told. This information is not intended to replace advice given to you by your health care provider. Make sure you discuss any questions you have with your health care provider. Document Revised: 05/14/2020 Document Reviewed: 05/14/2020 Elsevier Patient Education  2024 [employer]  

## 2022-11-30 ENCOUNTER — Other Ambulatory Visit: Payer: Self-pay | Admitting: Family Medicine

## 2022-11-30 DIAGNOSIS — I480 Paroxysmal atrial fibrillation: Secondary | ICD-10-CM

## 2022-12-07 ENCOUNTER — Encounter (INDEPENDENT_AMBULATORY_CARE_PROVIDER_SITE_OTHER): Payer: Federal, State, Local not specified - PPO | Admitting: Ophthalmology

## 2022-12-07 DIAGNOSIS — H43813 Vitreous degeneration, bilateral: Secondary | ICD-10-CM | POA: Diagnosis not present

## 2022-12-07 DIAGNOSIS — I10 Essential (primary) hypertension: Secondary | ICD-10-CM

## 2022-12-07 DIAGNOSIS — H35033 Hypertensive retinopathy, bilateral: Secondary | ICD-10-CM

## 2022-12-07 DIAGNOSIS — H353231 Exudative age-related macular degeneration, bilateral, with active choroidal neovascularization: Secondary | ICD-10-CM | POA: Diagnosis not present

## 2022-12-15 ENCOUNTER — Ambulatory Visit (HOSPITAL_COMMUNITY): Payer: Federal, State, Local not specified - PPO

## 2023-01-18 ENCOUNTER — Encounter (INDEPENDENT_AMBULATORY_CARE_PROVIDER_SITE_OTHER): Payer: Federal, State, Local not specified - PPO | Admitting: Ophthalmology

## 2023-01-18 DIAGNOSIS — I10 Essential (primary) hypertension: Secondary | ICD-10-CM | POA: Diagnosis not present

## 2023-01-18 DIAGNOSIS — H43813 Vitreous degeneration, bilateral: Secondary | ICD-10-CM | POA: Diagnosis not present

## 2023-01-18 DIAGNOSIS — H353231 Exudative age-related macular degeneration, bilateral, with active choroidal neovascularization: Secondary | ICD-10-CM

## 2023-01-18 DIAGNOSIS — H35033 Hypertensive retinopathy, bilateral: Secondary | ICD-10-CM | POA: Diagnosis not present

## 2023-01-22 ENCOUNTER — Encounter (INDEPENDENT_AMBULATORY_CARE_PROVIDER_SITE_OTHER): Payer: Medicare Other | Admitting: Ophthalmology

## 2023-02-05 ENCOUNTER — Ambulatory Visit: Payer: Federal, State, Local not specified - PPO | Admitting: Nutrition

## 2023-02-19 ENCOUNTER — Telehealth: Payer: Self-pay | Admitting: Family Medicine

## 2023-02-19 NOTE — Telephone Encounter (Signed)
Copied from CRM [id number]. Topic: Clinical - Prescription Issue >> Feb 19, 2023 11:46 AM Fuller Mandril wrote: Reason for CRM: Pt called. Has 3 refills on Accu-Chek Softclix Lancets lancets but pharmacy says the are unable to fill until 03/31/2023. Pt would like to speak with clinician about this. Stated she did not want a new Rx she just wants to be able to get the Rx because she only has 2 left. [facility] is correct pharmacy. Thank You.

## 2023-02-20 NOTE — Telephone Encounter (Signed)
Spoke with pharmacy - left vm for cb for pt that she should be able to get rx now

## 2023-02-22 ENCOUNTER — Encounter (INDEPENDENT_AMBULATORY_CARE_PROVIDER_SITE_OTHER): Payer: Medicare Other | Admitting: Ophthalmology

## 2023-02-23 ENCOUNTER — Encounter (INDEPENDENT_AMBULATORY_CARE_PROVIDER_SITE_OTHER): Payer: Federal, State, Local not specified - PPO | Admitting: Ophthalmology

## 2023-02-23 DIAGNOSIS — H35033 Hypertensive retinopathy, bilateral: Secondary | ICD-10-CM | POA: Diagnosis not present

## 2023-02-23 DIAGNOSIS — I10 Essential (primary) hypertension: Secondary | ICD-10-CM | POA: Diagnosis not present

## 2023-02-23 DIAGNOSIS — H43813 Vitreous degeneration, bilateral: Secondary | ICD-10-CM | POA: Diagnosis not present

## 2023-02-23 DIAGNOSIS — H353231 Exudative age-related macular degeneration, bilateral, with active choroidal neovascularization: Secondary | ICD-10-CM | POA: Diagnosis not present

## 2023-03-23 ENCOUNTER — Other Ambulatory Visit: Payer: Self-pay | Admitting: Family Medicine

## 2023-03-23 DIAGNOSIS — I480 Paroxysmal atrial fibrillation: Secondary | ICD-10-CM

## 2023-04-05 ENCOUNTER — Encounter (INDEPENDENT_AMBULATORY_CARE_PROVIDER_SITE_OTHER): Payer: Federal, State, Local not specified - PPO | Admitting: Ophthalmology

## 2023-04-05 DIAGNOSIS — H35033 Hypertensive retinopathy, bilateral: Secondary | ICD-10-CM | POA: Diagnosis not present

## 2023-04-05 DIAGNOSIS — H353231 Exudative age-related macular degeneration, bilateral, with active choroidal neovascularization: Secondary | ICD-10-CM | POA: Diagnosis not present

## 2023-04-05 DIAGNOSIS — I10 Essential (primary) hypertension: Secondary | ICD-10-CM

## 2023-04-05 DIAGNOSIS — H43813 Vitreous degeneration, bilateral: Secondary | ICD-10-CM | POA: Diagnosis not present

## 2023-05-03 ENCOUNTER — Other Ambulatory Visit: Payer: Self-pay | Admitting: Family Medicine

## 2023-05-03 DIAGNOSIS — I480 Paroxysmal atrial fibrillation: Secondary | ICD-10-CM

## 2023-05-12 ENCOUNTER — Other Ambulatory Visit: Payer: Self-pay | Admitting: Family Medicine

## 2023-05-14 ENCOUNTER — Ambulatory Visit (INDEPENDENT_AMBULATORY_CARE_PROVIDER_SITE_OTHER): Payer: Medicare Other | Admitting: Family Medicine

## 2023-05-14 ENCOUNTER — Encounter: Payer: Self-pay | Admitting: Family Medicine

## 2023-05-14 VITALS — BP 116/69 | HR 70 | Temp 98.9°F | Ht 63.0 in | Wt 119.0 lb

## 2023-05-14 DIAGNOSIS — E1159 Type 2 diabetes mellitus with other circulatory complications: Secondary | ICD-10-CM | POA: Diagnosis not present

## 2023-05-14 DIAGNOSIS — E034 Atrophy of thyroid (acquired): Secondary | ICD-10-CM

## 2023-05-14 DIAGNOSIS — E785 Hyperlipidemia, unspecified: Secondary | ICD-10-CM

## 2023-05-14 DIAGNOSIS — I700 Atherosclerosis of aorta: Secondary | ICD-10-CM | POA: Diagnosis not present

## 2023-05-14 DIAGNOSIS — I480 Paroxysmal atrial fibrillation: Secondary | ICD-10-CM

## 2023-05-14 DIAGNOSIS — E1169 Type 2 diabetes mellitus with other specified complication: Secondary | ICD-10-CM | POA: Diagnosis not present

## 2023-05-14 DIAGNOSIS — I152 Hypertension secondary to endocrine disorders: Secondary | ICD-10-CM

## 2023-05-14 LAB — BAYER DCA HB A1C WAIVED: HB A1C (BAYER DCA - WAIVED): 6 % — ABNORMAL HIGH (ref 4.8–5.6)

## 2023-05-14 NOTE — Progress Notes (Signed)
 Subjective: CC:DM PCP: Raliegh Ip, DO WUJ:[id number] Jenna Ramos is a 89 y.o. female presenting to clinic today for:  1. Type 2 Diabetes with hypertension, hyperlipidemia, afib:  Sugar has been diet controlled.  She does not eat sugar most the time but occasionally does have a few chocolate chips.  Her snacks typically consist of things like nuts.  She is compliant with her medications for atrial fibrillation and denies any bleeding.  Not treated with a statin for cholesterol due to advanced age.    There are no preventive care reminders to display for this patient.  Last A1c:  Lab Results  Component Value Date   HGBA1C 6.1 (H) 11/09/2022    ROS: Denies any chest pain, shortness of breath, edema, heart palpitations or bleeding.  She does feel a little dizzy this morning but notes that she has not had breakfast at all.  2.  Hypothyroidism Compliant with Synthroid 75 mcg daily.  Reports no tremor, heart palpitations or changes in bowel habits   ROS: Per HPI  Allergies  Allergen Reactions   Aggrenox [Aspirin-Dipyridamole Er] Anaphylaxis    Severe headache   Codeine Other (See Comments)    Feet burn and agitation   Morphine And Codeine Other (See Comments)    Feet burn and agitation   Gabapentin     Difficulty concentrating   Past Medical History:  Diagnosis Date   Anemia    takes Iron pill daily   Arthritis    Carotid artery occlusion    Fibromyalgia    Fibromyalgia    Glaucoma    uses Eye Drops daily   Hip fracture requiring operative repair (HCC) 10/17/2013   HOH (hard of hearing)    Hyperlipidemia    takes Atorvastatin daily   Hypertension    takes Benazepril daily   Hypothyroidism    takes Synthroid daily   Macular degeneration    wet and gets injections in both eyes   Nocturia    Peripheral vascular disease (HCC)    Rectal incontinence    Stroke (HCC)    TIA (transient ischemic attack) 01/23/2014   UTI (urinary tract infection) due to  Morganella Morganii 10/28/2013    Current Outpatient Medications:    Accu-Chek Softclix Lancets lancets, Dx E11.9 Test BS daily, Disp: 100 each, Rfl: 3   Acetaminophen (TYLENOL PO), Take 1-2 tablets by mouth every 6 (six) hours as needed (pain/headache)., Disp: , Rfl:    amLODipine (NORVASC) 10 MG tablet, Take 1 tablet (10 mg total) by mouth daily., Disp: 90 tablet, Rfl: 3   benazepril (LOTENSIN) 20 MG tablet, Take 1 tablet (20 mg total) by mouth daily., Disp: 90 tablet, Rfl: 3   Besifloxacin HCl 0.6 % SUSP, Place 1 drop into both eyes See admin instructions. Instill one drop into both eyes 4 times daily on the day of and the day after eye injections - once a month, Disp: , Rfl:    bimatoprost (LUMIGAN) 0.01 % SOLN, Place 1 drop into both eyes at bedtime., Disp: , Rfl:    ELIQUIS 2.5 MG TABS tablet, Take 1 tablet (2.5 mg total) by mouth 2 (two) times daily., Disp: 180 tablet, Rfl: 3   glucose blood (ACCU-CHEK GUIDE) test strip, USE TO TEST BLOOD SUGAR BID DX E11.9, Disp: 200 strip, Rfl: 3   levothyroxine (SYNTHROID) 75 MCG tablet, TAKE 1 TABLET BY MOUTH EVERY DAY BEFORE BREAKFAST, Disp: 90 tablet, Rfl: 2   metoprolol succinate (TOPROL-XL) 50 MG 24 hr tablet, TAKE  1 TABLET (50 MG TOTAL) BY MOUTH DAILY. BRIDGE SUPPLY TAKE WITH OR IMMEDIATELY FOLLOWING A MEAL., Disp: 90 tablet, Rfl: 0   Multiple Vitamin (MULTIVITAMIN WITH MINERALS) TABS tablet, Take 1 tablet by mouth daily., Disp: , Rfl:    Netarsudil Dimesylate (RHOPRESSA) 0.02 % SOLN, Apply to eye., Disp: , Rfl:    predniSONE (DELTASONE) 20 MG tablet, 2 po at sametime daily for 5 days-, Disp: 10 tablet, Rfl: 0  Current Facility-Administered Medications:    ipratropium-albuterol (DUONEB) 0.5-2.5 (3) MG/3ML nebulizer solution 3 mL, 3 mL, Nebulization, Once,  Social History   Socioeconomic History   Marital status: Widowed    Spouse name: Not on file   Number of children: Not on file   Years of education: Not on file   Highest education level:  Not on file  Occupational History   Not on file  Tobacco Use   Smoking status: Former   Smokeless tobacco: Never   Tobacco comments:    quit smoking 75yrs ago  Vaping Use   Vaping status: Never Used  Substance and Sexual Activity   Alcohol use: No    Alcohol/week: 0.0 standard drinks of alcohol   Drug use: No   Sexual activity: Yes    Birth control/protection: Post-menopausal  Other Topics Concern   Not on file  Social History Narrative   Not on file   Social Drivers of Health   Financial Resource Strain: Not on file  Food Insecurity: Not on file  Transportation Needs: Not on file  Physical Activity: Not on file  Stress: Not on file  Social Connections: Not on file  Intimate Partner Violence: Not on file   Family History  Problem Relation Age of Onset   Prostate cancer Father    Cancer Father    Lung disease Sister    Cancer Brother     Objective: Office vital signs reviewed. BP 116/69   Pulse 70   Temp 98.9 F (37.2 C)   Ht 5\' 3"  (1.6 m)   Wt 119 lb (54 kg)   SpO2 99%   BMI 21.08 kg/m   Physical Examination:  General: Awake, alert, well nourished, No acute distress HEENT: sclera white, MMM Cardio: regular rate and rhythm, S1S2 heard, no murmurs appreciated Pulm: clear to auscultation bilaterally, no wheezes, rhonchi or rales; normal work of breathing on room air MSK: Ambulating independently with hunched station but normal gait Neuro: Hard of hearing.  No tremor.  Oriented.   Assessment/ Plan: 89 y.o. female   Hypothyroidism due to acquired atrophy of thyroid - Plan: TSH + free T4, CMP14+EGFR  Type 2 diabetes mellitus with other specified complication, without long-term current use of insulin (HCC) - Plan: Bayer DCA Hb A1c Waived, CMP14+EGFR  Hyperlipidemia associated with type 2 diabetes mellitus (HCC) - Plan: CMP14+EGFR  Hypertension associated with diabetes (HCC) - Plan: CMP14+EGFR  Aortic atherosclerosis (HCC) - Plan:  CMP14+EGFR  Paroxysmal atrial fibrillation (HCC) - Plan: CMP14+EGFR, CBC  Asymptomatic from a thyroid standpoint.  Thyroid levels collected  Sugar and blood pressure well-controlled, with A1c of 6.0.  No changes needed.  Check renal function, liver enzymes.  Plan for fasting lipid panel at next visit.  She is not currently treated with a cholesterol medication I presume this is secondary to advanced age and lack of data in her age group.  However she does have history of aortic atherosclerosis.  She was both rate and rhythm controlled today.  Not having any red flag signs or symptoms  with regards to her use of chronic anticoagulation  Raliegh Ip, DO [facility] [phone]

## 2023-05-15 ENCOUNTER — Encounter: Payer: Self-pay | Admitting: Family Medicine

## 2023-05-15 ENCOUNTER — Other Ambulatory Visit: Payer: Self-pay | Admitting: *Deleted

## 2023-05-15 LAB — CBC
Hematocrit: 39.6 % (ref 34.0–46.6)
Hemoglobin: 12.8 g/dL (ref 11.1–15.9)
MCH: 27.9 pg (ref 26.6–33.0)
MCHC: 32.3 g/dL (ref 31.5–35.7)
MCV: 86 fL (ref 79–97)
Platelets: 284 10*3/uL (ref 150–450)
RBC: 4.59 x10E6/uL (ref 3.77–5.28)
RDW: 13.3 % (ref 11.7–15.4)
WBC: 7 10*3/uL (ref 3.4–10.8)

## 2023-05-15 LAB — CMP14+EGFR
ALT: 14 IU/L (ref 0–32)
AST: 19 IU/L (ref 0–40)
Albumin: 4.6 g/dL (ref 3.6–4.6)
Alkaline Phosphatase: 86 IU/L (ref 44–121)
BUN/Creatinine Ratio: 26 (ref 12–28)
BUN: 20 mg/dL (ref 10–36)
Bilirubin Total: 0.4 mg/dL (ref 0.0–1.2)
CO2: 24 mmol/L (ref 20–29)
Calcium: 9.8 mg/dL (ref 8.7–10.3)
Chloride: 103 mmol/L (ref 96–106)
Creatinine, Ser: 0.76 mg/dL (ref 0.57–1.00)
Globulin, Total: 2.6 g/dL (ref 1.5–4.5)
Glucose: 158 mg/dL — ABNORMAL HIGH (ref 70–99)
Potassium: 4.3 mmol/L (ref 3.5–5.2)
Sodium: 141 mmol/L (ref 134–144)
Total Protein: 7.2 g/dL (ref 6.0–8.5)
eGFR: 73 mL/min/{1.73_m2} (ref >59)

## 2023-05-15 LAB — TSH+FREE T4
Free T4: 1.39 ng/dL (ref 0.82–1.77)
TSH: 1.15 u[IU]/mL (ref 0.450–4.500)

## 2023-05-15 MED ORDER — ACCU-CHEK SOFTCLIX LANCET DEV KIT: PACK | 1 refills | Status: AC

## 2023-05-15 MED ORDER — ACCU-CHEK SOFTCLIX LANCETS MISC: 3 refills | Status: AC

## 2023-05-15 NOTE — Addendum Note (Signed)
 Addended by: Julious Payer D on: 05/15/2023 08:11 AM   Modules accepted: Orders

## 2023-05-17 ENCOUNTER — Encounter (INDEPENDENT_AMBULATORY_CARE_PROVIDER_SITE_OTHER): Payer: Medicare Other | Admitting: Ophthalmology

## 2023-05-17 DIAGNOSIS — I10 Essential (primary) hypertension: Secondary | ICD-10-CM

## 2023-05-17 DIAGNOSIS — H43813 Vitreous degeneration, bilateral: Secondary | ICD-10-CM | POA: Diagnosis not present

## 2023-05-17 DIAGNOSIS — H35033 Hypertensive retinopathy, bilateral: Secondary | ICD-10-CM

## 2023-05-17 DIAGNOSIS — H353231 Exudative age-related macular degeneration, bilateral, with active choroidal neovascularization: Secondary | ICD-10-CM

## 2023-06-28 ENCOUNTER — Encounter (INDEPENDENT_AMBULATORY_CARE_PROVIDER_SITE_OTHER): Admitting: Ophthalmology

## 2023-06-28 DIAGNOSIS — I10 Essential (primary) hypertension: Secondary | ICD-10-CM

## 2023-06-28 DIAGNOSIS — H35033 Hypertensive retinopathy, bilateral: Secondary | ICD-10-CM

## 2023-06-28 DIAGNOSIS — H43813 Vitreous degeneration, bilateral: Secondary | ICD-10-CM

## 2023-06-28 DIAGNOSIS — H353231 Exudative age-related macular degeneration, bilateral, with active choroidal neovascularization: Secondary | ICD-10-CM | POA: Diagnosis not present

## 2023-08-08 ENCOUNTER — Other Ambulatory Visit: Payer: Self-pay | Admitting: Family Medicine

## 2023-08-09 ENCOUNTER — Encounter (INDEPENDENT_AMBULATORY_CARE_PROVIDER_SITE_OTHER): Admitting: Ophthalmology

## 2023-08-09 DIAGNOSIS — H35033 Hypertensive retinopathy, bilateral: Secondary | ICD-10-CM | POA: Diagnosis not present

## 2023-08-09 DIAGNOSIS — H43813 Vitreous degeneration, bilateral: Secondary | ICD-10-CM | POA: Diagnosis not present

## 2023-08-09 DIAGNOSIS — I10 Essential (primary) hypertension: Secondary | ICD-10-CM

## 2023-08-09 DIAGNOSIS — H353231 Exudative age-related macular degeneration, bilateral, with active choroidal neovascularization: Secondary | ICD-10-CM | POA: Diagnosis not present

## 2023-08-21 ENCOUNTER — Ambulatory Visit: Payer: Self-pay

## 2023-08-21 NOTE — Telephone Encounter (Addendum)
 FYI Only or Action Required?: FYI only for provider  Patient was last seen in primary care on 05/14/2023 by Eliodoro Guerin, DO. Called Nurse Triage reporting Abdominal Pain. Symptoms began several weeks ago. Interventions attempted: Nothing. Symptoms are: unchanged.  Triage Disposition: See PCP Within 2 Weeks  Patient/caregiver understands and will follow disposition?: Yes   Copied from CRM [id number]. Topic: Clinical - Red Word Triage >> Aug 21, 2023  3:22 PM Baldemar Lev wrote: Red Word that prompted transfer to Nurse Triage: Abdominal pain for 2 weeks, worsening. Reason for Disposition  Abdominal pain is a chronic symptom (recurrent or ongoing AND present > 4 weeks)  Answer Assessment - Initial Assessment Questions 1. LOCATION: Where does it hurt?      Upper abd in center of abd 2. RADIATION: Does the pain shoot anywhere else? (e.g., chest, back)     no 3. ONSET: When did the pain begin? (e.g., minutes, hours or days ago)      X  couple weeks 4. SUDDEN: Gradual or sudden onset?     N/a 5. PATTERN Does the pain come and go, or is it constant?    - If it comes and goes: How long does it last? Do you have pain now?     (Note: Comes and goes means the pain is intermittent. It goes away completely between bouts.)    - If constant: Is it getting better, staying the same, or getting worse?      (Note: Constant means the pain never goes away completely; most serious pain is constant and gets worse.)      Comes and goes 6. SEVERITY: How bad is the pain?  (e.g., Scale 1-10; mild, moderate, or severe)    - MILD (1-3): Doesn't interfere with normal activities, abdomen soft and not tender to touch.     - MODERATE (4-7): Interferes with normal activities or awakens from sleep, abdomen tender to touch.     - SEVERE (8-10): Excruciating pain, doubled over, unable to do any normal activities.       moderate 7. RECURRENT SYMPTOM: Have you ever had this type of stomach pain  before? If Yes, ask: When was the last time? and What happened that time?      N/a 8. CAUSE: What do you think is causing the stomach pain?     unknown 9. RELIEVING/AGGRAVATING FACTORS: What makes it better or worse? (e.g., antacids, bending or twisting motion, bowel movement)     N/a 10. OTHER SYMPTOMS: Do you have any other symptoms? (e.g., back pain, diarrhea, fever, urination pain, vomiting)      nausea 11. PREGNANCY: Is there any chance you are pregnant? When was your last menstrual period?       N/a  Protocols used: Abdominal Pain - Female-A-AH

## 2023-08-23 ENCOUNTER — Ambulatory Visit (INDEPENDENT_AMBULATORY_CARE_PROVIDER_SITE_OTHER): Admitting: Family Medicine

## 2023-08-23 VITALS — BP 120/55 | HR 59 | Temp 98.0°F | Ht 63.0 in | Wt 116.6 lb

## 2023-08-23 DIAGNOSIS — I480 Paroxysmal atrial fibrillation: Secondary | ICD-10-CM

## 2023-08-23 DIAGNOSIS — E034 Atrophy of thyroid (acquired): Secondary | ICD-10-CM

## 2023-08-23 DIAGNOSIS — R7303 Prediabetes: Secondary | ICD-10-CM

## 2023-08-23 DIAGNOSIS — R42 Dizziness and giddiness: Secondary | ICD-10-CM | POA: Diagnosis not present

## 2023-08-23 DIAGNOSIS — I10 Essential (primary) hypertension: Secondary | ICD-10-CM

## 2023-08-23 LAB — BAYER DCA HB A1C WAIVED: HB A1C (BAYER DCA - WAIVED): 6.1 % — ABNORMAL HIGH (ref 4.8–5.6)

## 2023-08-23 NOTE — Progress Notes (Unsigned)
   Acute Office Visit  Subjective:     Patient ID: Jenna Ramos, female    DOB: 13-Mar-1928, 89 y.o.   MRN: [id number]  Chief Complaint  Patient presents with   Neck Pain    Dizziness   Patient is in today for neck pain. Appt was originally scheduled for abdominal pain but this has not resolved.   She has also had a mild headache. Had abdominal pain earlier this week with nausea that has now resolved.   Review of Systems  Neurological:  Positive for dizziness.        Objective:    BP (!) 120/55 Comment: at home reading per pt  Pulse (!) 59   Temp 98 F (36.7 C) (Temporal)   Ht 5' 3 (1.6 m)   Wt 116 lb 9.6 oz (52.9 kg)   SpO2 98%   BMI 20.65 kg/m  {Vitals History (Optional):23777}  Physical Exam  No results found for any visits on 08/23/23.      Assessment & Plan:   Problem List Items Addressed This Visit   None   No orders of the defined types were placed in this encounter.   No follow-ups on file.  Jenna CHRISTELLA Search, FNP

## 2023-08-24 ENCOUNTER — Ambulatory Visit: Payer: Self-pay | Admitting: Family Medicine

## 2023-08-24 LAB — ANEMIA PROFILE B
Basophils Absolute: 0 10*3/uL (ref 0.0–0.2)
Basos: 0 %
EOS (ABSOLUTE): 0.1 10*3/uL (ref 0.0–0.4)
Eos: 2 %
Ferritin: 76 ng/mL (ref 15–150)
Folate: >20 ng/mL (ref >3.0)
Hematocrit: 38.3 % (ref 34.0–46.6)
Hemoglobin: 12.3 g/dL (ref 11.1–15.9)
Immature Grans (Abs): 0 10*3/uL (ref 0.0–0.1)
Immature Granulocytes: 0 %
Iron Saturation: 14 % — ABNORMAL LOW (ref 15–55)
Iron: 40 ug/dL (ref 27–139)
Lymphocytes Absolute: 1.6 10*3/uL (ref 0.7–3.1)
Lymphs: 22 %
MCH: 28.5 pg (ref 26.6–33.0)
MCHC: 32.1 g/dL (ref 31.5–35.7)
MCV: 89 fL (ref 79–97)
Monocytes Absolute: 0.6 10*3/uL (ref 0.1–0.9)
Monocytes: 9 %
Neutrophils Absolute: 4.6 10*3/uL (ref 1.4–7.0)
Neutrophils: 67 %
Platelets: 275 10*3/uL (ref 150–450)
RBC: 4.31 x10E6/uL (ref 3.77–5.28)
RDW: 13.4 % (ref 11.7–15.4)
Retic Ct Pct: 1 % (ref 0.6–2.6)
Total Iron Binding Capacity: 288 ug/dL (ref 250–450)
UIBC: 248 ug/dL (ref 118–369)
Vitamin B-12: 600 pg/mL (ref 232–1245)
WBC: 7 10*3/uL (ref 3.4–10.8)

## 2023-08-24 LAB — CMP14+EGFR
ALT: 14 IU/L (ref 0–32)
AST: 17 IU/L (ref 0–40)
Albumin: 4.4 g/dL (ref 3.6–4.6)
Alkaline Phosphatase: 78 IU/L (ref 44–121)
BUN/Creatinine Ratio: 24 (ref 12–28)
BUN: 18 mg/dL (ref 10–36)
Bilirubin Total: 0.3 mg/dL (ref 0.0–1.2)
CO2: 25 mmol/L (ref 20–29)
Calcium: 9.4 mg/dL (ref 8.7–10.3)
Chloride: 101 mmol/L (ref 96–106)
Creatinine, Ser: 0.75 mg/dL (ref 0.57–1.00)
Globulin, Total: 2.5 g/dL (ref 1.5–4.5)
Glucose: 138 mg/dL — ABNORMAL HIGH (ref 70–99)
Potassium: 4.6 mmol/L (ref 3.5–5.2)
Sodium: 140 mmol/L (ref 134–144)
Total Protein: 6.9 g/dL (ref 6.0–8.5)
eGFR: 74 mL/min/{1.73_m2} (ref >59)

## 2023-08-24 LAB — TSH: TSH: 0.767 u[IU]/mL (ref 0.450–4.500)

## 2023-08-27 ENCOUNTER — Telehealth: Payer: Self-pay

## 2023-08-27 NOTE — Telephone Encounter (Signed)
 Copied from CRM [id number]. Topic: Clinical - Lab/Test Results >> Aug 27, 2023  9:24 AM Graeme ORN wrote: Reason for CRM: Patient returned missed call for labs. Read note as written by provider. Patient understood. No Further questions at this time. Patient states she is doing better - think she may be getting over stomach problems. Was only mild Yesterday. Thank You.

## 2023-08-27 NOTE — Telephone Encounter (Signed)
 E2C2 read lab results.

## 2023-08-28 ENCOUNTER — Encounter: Payer: Self-pay | Admitting: Family Medicine

## 2023-08-30 ENCOUNTER — Ambulatory Visit: Admitting: Nurse Practitioner

## 2023-08-30 ENCOUNTER — Other Ambulatory Visit (INDEPENDENT_AMBULATORY_CARE_PROVIDER_SITE_OTHER)

## 2023-08-30 ENCOUNTER — Encounter: Payer: Self-pay | Admitting: Nurse Practitioner

## 2023-08-30 ENCOUNTER — Ambulatory Visit: Payer: Self-pay

## 2023-08-30 VITALS — BP 177/60 | HR 65 | Temp 97.6°F | Ht 63.0 in | Wt 113.8 lb

## 2023-08-30 DIAGNOSIS — R1013 Epigastric pain: Secondary | ICD-10-CM | POA: Diagnosis not present

## 2023-08-30 DIAGNOSIS — R42 Dizziness and giddiness: Secondary | ICD-10-CM | POA: Diagnosis not present

## 2023-08-30 DIAGNOSIS — I480 Paroxysmal atrial fibrillation: Secondary | ICD-10-CM | POA: Diagnosis not present

## 2023-08-30 DIAGNOSIS — I6523 Occlusion and stenosis of bilateral carotid arteries: Secondary | ICD-10-CM

## 2023-08-30 DIAGNOSIS — I700 Atherosclerosis of aorta: Secondary | ICD-10-CM

## 2023-08-30 DIAGNOSIS — R0789 Other chest pain: Secondary | ICD-10-CM | POA: Insufficient documentation

## 2023-08-30 DIAGNOSIS — R1084 Generalized abdominal pain: Secondary | ICD-10-CM | POA: Insufficient documentation

## 2023-08-30 MED ORDER — OMEPRAZOLE 20 MG PO CPDR: 20.0000 mg | DELAYED_RELEASE_CAPSULE | Freq: Every day | ORAL | 0 refills | Status: DC

## 2023-08-30 NOTE — Telephone Encounter (Signed)
 FYI Only or Action Required?: FYI only for provider.  Patient was last seen in primary care on 08/23/2023 by Joesph Annabella HERO, FNP. Called Nurse Triage reporting Abdominal Pain. Symptoms began several months ago. Interventions attempted: Other: seen in office. Symptoms are: Abdominal pain, nausea, no appetite, Dizzinessgradually worsening.  Triage Disposition: See HCP Within 4 Hours (Or PCP Triage)  Patient/caregiver understands and will follow disposition?: Yes              Copied from CRM [id number]. Topic: Clinical - Red Word Triage >> Aug 30, 2023  9:37 AM Powell HERO wrote: Red Word that prompted transfer to Nurse Triage: Seen a week ago for stomach pain, states severe pain in her abdomen about 30 mins after she eats. Every morning it is very uncomfortable when she wakes up. Reason for Disposition  [1] MILD-MODERATE pain AND [2] constant AND [3] present > 2 hours  Answer Assessment - Initial Assessment Questions 1. LOCATION: Where does it hurt?      All over abdomen - under breast bone - Hurts when presses on that area. Also back of her head and dizziness 2. RADIATION: Does the pain shoot anywhere else? (e.g., chest, back)     no 3. ONSET: When did the pain begin? (e.g., minutes, hours or days ago)      3 weeks 4. SUDDEN: Gradual or sudden onset?     Gradual 5. PATTERN Does the pain come and go, or is it constant?    - If it comes and goes: How long does it last? Do you have pain now?     (Note: Comes and goes means the pain is intermittent. It goes away completely between bouts.)    - If constant: Is it getting better, staying the same, or getting worse?      (Note: Constant means the pain never goes away completely; most serious pain is constant and gets worse.)      Comes and goes 6. SEVERITY: How bad is the pain?  (e.g., Scale 1-10; mild, moderate, or severe)    - MILD (1-3): Doesn't interfere with normal activities, abdomen soft and not tender to  touch.     - MODERATE (4-7): Interferes with normal activities or awakens from sleep, abdomen tender to touch.     - SEVERE (8-10): Excruciating pain, doubled over, unable to do any normal activities.       moderate 7. RECURRENT SYMPTOM: Have you ever had this type of stomach pain before? If Yes, ask: When was the last time? and What happened that time?      No 8. CAUSE: What do you think is causing the stomach pain?     unsure 9. RELIEVING/AGGRAVATING FACTORS: What makes it better or worse? (e.g., antacids, bending or twisting motion, bowel movement)     Laying down 10. OTHER SYMPTOMS: Do you have any other symptoms? (e.g., back pain, diarrhea, fever, urination pain, vomiting)       Nausea - no appetite. Pain is worst after eating. Dizziness  Protocols used: Abdominal Pain - Female-A-AH

## 2023-08-30 NOTE — Progress Notes (Signed)
 Acute Office Visit  Subjective:     Patient ID: Jenna Ramos, female    DOB: May 20, 1928, 89 y.o.   MRN: [id number]  Chief Complaint  Patient presents with   Abdominal Pain    Symptoms for a month   Dizziness    HPI Jenna Ramos. Jenna Ramos is a 89 year old female who presents with her son for evaluation of acute symptoms including dizziness, chest pressure, and abdominal pain. She was previously seen on 08/23/2023 by Joesph, FNP, and had labs drawn at that time; however, she reports that her symptoms have not resolved. The abdominal pain typically occurs after eating, most recently after consuming an egg salad sandwich for lunch. She experienced burping, nausea, and the need to lie down following the meal. She denies diarrhea but reports right upper quadrant pain accompanied by sharp neck and shoulder pressure. She describes the abdominal pain as sharp that becomes dull at times, rating it as 7 out of 10 in intensity, with occasional improvement. Additionally, she reports a sensation of chest heaviness and is actively complaining of dizziness during today's visit. Her past medical history includes atherosclerosis, atrial fibrillation, coronary artery disease, hypertension, and hypothyroidism. Her hypertension is currently managed with amlodipine  10 mg daily, benazepril  20 mg daily, and metoprolol  XL 50 mg daily. Her blood pressure in the office today is elevated at 177/60 mmHg. Labs from last visit were normal  Recent Results (from the past 2160 hours)  Bayer DCA Hb A1c Waived     Status: Abnormal   Collection Time: 08/23/23 12:12 PM  Result Value Ref Range   HB A1C (BAYER DCA - WAIVED) 6.1 (H) 4.8 - 5.6 %    Comment:          Prediabetes: 5.7 - 6.4          Diabetes: >6.4          Glycemic control for adults with diabetes: <7.0   CMP14+EGFR     Status: Abnormal   Collection Time: 08/23/23 12:18 PM  Result Value Ref Range   Glucose 138 (H) 70 - 99 mg/dL   BUN 18 10 - 36 mg/dL    Creatinine, Ser 9.24 0.57 - 1.00 mg/dL   eGFR 74 >40 fO/fpw/8.26   BUN/Creatinine Ratio 24 12 - 28   Sodium 140 134 - 144 mmol/L   Potassium 4.6 3.5 - 5.2 mmol/L   Chloride 101 96 - 106 mmol/L   CO2 25 20 - 29 mmol/L   Calcium  9.4 8.7 - 10.3 mg/dL   Total Protein 6.9 6.0 - 8.5 g/dL   Albumin 4.4 3.6 - 4.6 g/dL   Globulin, Total 2.5 1.5 - 4.5 g/dL   Bilirubin Total 0.3 0.0 - 1.2 mg/dL   Alkaline Phosphatase 78 44 - 121 IU/L   AST 17 0 - 40 IU/L   ALT 14 0 - 32 IU/L  TSH     Status: None   Collection Time: 08/23/23 12:18 PM  Result Value Ref Range   TSH 0.767 0.450 - 4.500 uIU/mL  Anemia Profile B     Status: Abnormal   Collection Time: 08/23/23 12:18 PM  Result Value Ref Range   Total Iron  Binding Capacity 288 250 - 450 ug/dL   UIBC 751 881 - 630 ug/dL   Iron  40 27 - 139 ug/dL   Iron  Saturation 14 (L) 15 - 55 %   Ferritin 76 15 - 150 ng/mL   Vitamin B-12 600 232 - 1,245 pg/mL   Folate >20.0 >  3.0 ng/mL    Comment: A serum folate concentration of less than 3.1 ng/mL is considered to represent clinical deficiency.    WBC 7.0 3.4 - 10.8 x10E3/uL   RBC 4.31 3.77 - 5.28 x10E6/uL   Hemoglobin 12.3 11.1 - 15.9 g/dL   Hematocrit 61.6 65.9 - 46.6 %   MCV 89 79 - 97 fL   MCH 28.5 26.6 - 33.0 pg   MCHC 32.1 31.5 - 35.7 g/dL   RDW 86.5 88.2 - 84.5 %   Platelets 275 150 - 450 x10E3/uL   Neutrophils 67 Not Estab. %   Lymphs 22 Not Estab. %   Monocytes 9 Not Estab. %   Eos 2 Not Estab. %   Basos 0 Not Estab. %   Neutrophils Absolute 4.6 1.4 - 7.0 x10E3/uL   Lymphocytes Absolute 1.6 0.7 - 3.1 x10E3/uL   Monocytes Absolute 0.6 0.1 - 0.9 x10E3/uL   EOS (ABSOLUTE) 0.1 0.0 - 0.4 x10E3/uL   Basophils Absolute 0.0 0.0 - 0.2 x10E3/uL   Immature Granulocytes 0 Not Estab. %   Immature Grans (Abs) 0.0 0.0 - 0.1 x10E3/uL   Retic Ct Pct 1.0 0.6 - 2.6 %     Active Ambulatory Problems    Diagnosis Date Noted   Fibromyalgia muscle pain 10/28/2013   Carotid artery disease (HCC) 01/14/2014    Essential hypertension 05/04/2014   Hypothyroidism    HLD (hyperlipidemia)    Pre-diabetes 12/19/2016   Iron  deficiency anemia 12/03/2017   Aortic atherosclerosis (HCC) 07/01/2018   Atrial fibrillation (HCC) 07/04/2018   Macular degeneration of both eyes 02/16/2020   Epigastric pain 08/30/2023   Dizziness 08/30/2023   Pressure in left side of chest 08/30/2023   Resolved Ambulatory Problems    Diagnosis Date Noted   Hip fracture requiring operative repair (HCC) 10/17/2013   Anemia    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 10/28/2013   Acute blood loss anemia 10/28/2013   Carotid stenosis 01/14/2014   TIA (transient ischemic attack) 01/23/2014   Hip pain 03/31/2014   Newly recognized heart murmur 11/09/2017   Numbness and tingling in left hand 12/03/2017   Chest heaviness 12/03/2017   Fall 07/07/2019   Rib pain on left side 07/07/2019   Generalized abdominal pain 08/30/2023   Past Medical History:  Diagnosis Date   Arthritis    Carotid artery occlusion    Fibromyalgia    Fibromyalgia    Glaucoma    HOH (hard of hearing)    Hyperlipidemia    Hypertension    Macular degeneration    Nocturia    Peripheral vascular disease (HCC)    Rectal incontinence    Stroke (HCC)     Review of Systems  Constitutional:  Negative for chills and fever.  Cardiovascular:        Chest pressure  Gastrointestinal:  Positive for abdominal pain and nausea.       Epigastric, burping  Skin:  Negative for itching and rash.  Neurological:  Positive for dizziness and weakness. Negative for headaches.   Negative unless indicated in HPI    Objective:    BP (!) 177/60   Pulse 65   Temp 97.6 F (36.4 C) (Temporal)   Ht 5' 3 (1.6 m)   Wt 113 lb 12.8 oz (51.6 kg)   SpO2 96%   BMI 20.16 kg/m  BP Readings from Last 3 Encounters:  08/30/23 (!) 177/60  08/23/23 (!) 120/55  05/14/23 116/69   Wt Readings from Last 3  Encounters:  08/30/23 113 lb 12.8 oz (51.6 kg)   08/23/23 116 lb 9.6 oz (52.9 kg)  05/14/23 119 lb (54 kg)      Physical Exam Vitals and nursing note reviewed.  Constitutional:      General: She is not in acute distress. HENT:     Head: Normocephalic and atraumatic.     Nose: Nose normal.     Mouth/Throat:     Mouth: Mucous membranes are moist.   Eyes:     General: No scleral icterus.    Extraocular Movements: Extraocular movements intact.     Conjunctiva/sclera: Conjunctivae normal.     Pupils: Pupils are equal, round, and reactive to light.    Cardiovascular:     Rate and Rhythm: Normal rate and regular rhythm.  Pulmonary:     Effort: Pulmonary effort is normal.     Breath sounds: Normal breath sounds. No wheezing.  Abdominal:     Palpations: Abdomen is soft.     Tenderness: There is abdominal tenderness in the epigastric area. Negative signs include Murphy's sign.   Musculoskeletal:        General: Normal range of motion.     Right lower leg: No edema.     Left lower leg: No edema.   Skin:    General: Skin is warm and dry.     Findings: No rash.   Neurological:     Mental Status: She is alert and oriented to person, place, and time.   Psychiatric:        Mood and Affect: Mood normal.        Behavior: Behavior normal.        Thought Content: Thought content normal.        Judgment: Judgment normal.    EKG: Rate: 61 BPM PR: 204 msec QT: 430 msec QTcH 431 msec msec QRSD: 92 msec P-QRS-T: 68/40/67 degree Interpretation:Sinus Rhythm -frequent ectopic ventricular beats # VECs = 2 -Negative precordial T-waves.  No results found for any visits on 08/30/23.      Assessment & Plan:  Dizziness -     EKG 12-Lead -     Ambulatory referral to Cardiology  Pressure in left side of chest -     EKG 12-Lead -     Ambulatory referral to Cardiology  Epigastric pain -     Hepatic function panel -     Omeprazole; Take 1 capsule (20 mg total) by mouth daily.  Dispense: 30 capsule; Refill: 0  Bilateral  carotid artery stenosis -     Ambulatory referral to Cardiology  Paroxysmal atrial fibrillation [facility]) -     Ambulatory referral to Cardiology  Aortic atherosclerosis [facility]) -     Ambulatory referral to Cardiology  Jenna Ramos is a 89 year old Caucasian female presented for chest pressure/dizziness for 2 weeks EKG normal sinus with ectopic beat: Long term heart monitor for 2-weeks, education provided; refer to cardiology Abdominal pain: LFTs results pending Omeprazole 20 mg dialy Future may need US  to r/u possible gallbladder issues  The above assessment and management plan was discussed with the patient. The patient verbalized understanding of and has agreed to the management plan. Patient is aware to call the clinic if they develop any new symptoms or if symptoms persist or worsen. Patient is aware when to return to the clinic for a follow-up visit. Patient educated on when it is appropriate to go to the emergency department.  Return if symptoms worsen or fail to improve.  Jenna Ramos  Jenna Louis Thompson, DNP [facility] [address] [place], [place] [address] [phone]  Note: This document was prepared by [employer] voice dictation technology and any errors that results from this process are unintentional.

## 2023-08-30 NOTE — Addendum Note (Signed)
 Addended by: MAR CHIQUITA HERO on: 08/30/2023 02:19 PM   Modules accepted: Orders

## 2023-08-31 ENCOUNTER — Ambulatory Visit

## 2023-08-31 LAB — HEPATIC FUNCTION PANEL
ALT: 11 IU/L (ref 0–32)
AST: 17 IU/L (ref 0–40)
Albumin: 4.8 g/dL — ABNORMAL HIGH (ref 3.6–4.6)
Alkaline Phosphatase: 81 IU/L (ref 44–121)
Bilirubin Total: 0.4 mg/dL (ref 0.0–1.2)
Bilirubin, Direct: 0.14 mg/dL (ref 0.00–0.40)
Total Protein: 7.5 g/dL (ref 6.0–8.5)

## 2023-09-03 ENCOUNTER — Ambulatory Visit: Payer: Self-pay | Admitting: Nurse Practitioner

## 2023-09-14 ENCOUNTER — Other Ambulatory Visit: Payer: Self-pay | Admitting: Family Medicine

## 2023-09-14 DIAGNOSIS — I480 Paroxysmal atrial fibrillation: Secondary | ICD-10-CM

## 2023-09-20 ENCOUNTER — Encounter (INDEPENDENT_AMBULATORY_CARE_PROVIDER_SITE_OTHER): Admitting: Ophthalmology

## 2023-09-20 DIAGNOSIS — H35033 Hypertensive retinopathy, bilateral: Secondary | ICD-10-CM

## 2023-09-20 DIAGNOSIS — H43813 Vitreous degeneration, bilateral: Secondary | ICD-10-CM | POA: Diagnosis not present

## 2023-09-20 DIAGNOSIS — H353231 Exudative age-related macular degeneration, bilateral, with active choroidal neovascularization: Secondary | ICD-10-CM

## 2023-09-20 DIAGNOSIS — I10 Essential (primary) hypertension: Secondary | ICD-10-CM

## 2023-09-22 ENCOUNTER — Other Ambulatory Visit: Payer: Self-pay | Admitting: Nurse Practitioner

## 2023-09-22 DIAGNOSIS — R1013 Epigastric pain: Secondary | ICD-10-CM

## 2023-10-09 ENCOUNTER — Encounter: Payer: Self-pay | Admitting: Family Medicine

## 2023-10-09 ENCOUNTER — Ambulatory Visit: Admitting: Family Medicine

## 2023-10-09 VITALS — BP 134/68 | HR 64 | Temp 97.8°F | Ht 63.0 in | Wt 115.0 lb

## 2023-10-09 DIAGNOSIS — I10 Essential (primary) hypertension: Secondary | ICD-10-CM | POA: Diagnosis not present

## 2023-10-09 DIAGNOSIS — R1013 Epigastric pain: Secondary | ICD-10-CM | POA: Diagnosis not present

## 2023-10-09 DIAGNOSIS — R4189 Other symptoms and signs involving cognitive functions and awareness: Secondary | ICD-10-CM

## 2023-10-09 NOTE — Progress Notes (Signed)
 Subjective: CC: Foggy headed, abdominal discomfort PCP: Jolinda Norene HERO, DO Jenna Ramos is a 89 y.o. female presenting to clinic today for:  1.  Foggy headed Patient is accompanied today by her son.  This is the third time that she has been seen for same issues.  She has been seen a couple of times for dizziness but she actually describes this more as a foggy headedness with occasional instability.  This is been ongoing for months intermittently.  She denies any neurologic changes including unilateral numbness, tingling, difficulty with speech or swallowing.  She reports no worsening of her hearing.  At baseline she has hearing loss.  She denies any visual disturbance.  She denies true vertigo.  No lightheadedness with position changes.  2.  Abdominal pain She reports this occurs roughly a couple of hours following meals.  She has been trying to limit various foods because she wondered if maybe she had some type of allergy .  She has had tick bite previously but never had any substantial symptomology from that.  She does not consume a large amount of meat.  She denies any changes in bowel habits and has chronic constipation that is stable.  No blood in stool.  No nausea, vomiting.  She took the PPI that was prescribed in June for about 2 weeks before discontinuing because it made no difference.   ROS: Per HPI  Allergies  Allergen Reactions   Aggrenox [Aspirin -Dipyridamole Er] Anaphylaxis    Severe headache   Codeine Other (See Comments)    Feet burn and agitation   Morphine  And Codeine Other (See Comments)    Feet burn and agitation   Gabapentin      Difficulty concentrating   Past Medical History:  Diagnosis Date   Anemia    takes Iron  pill daily   Arthritis    Carotid artery occlusion    Fibromyalgia    Fibromyalgia    Glaucoma    uses Eye Drops daily   Hip fracture requiring operative repair (HCC) 10/17/2013   HOH (hard of hearing)    Hyperlipidemia     takes Atorvastatin  daily   Hypertension    takes Benazepril  daily   Hypothyroidism    takes Synthroid  daily   Macular degeneration    wet and gets injections in both eyes   Nocturia    Peripheral vascular disease (HCC)    Rectal incontinence    Stroke (HCC)    TIA (transient ischemic attack) 01/23/2014   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 10/28/2013    Current Outpatient Medications:    Accu-Chek Softclix Lancets lancets, Dx E11.9 Test BS daily, Disp: 100 each, Rfl: 3   Acetaminophen  (TYLENOL  PO), Take 1-2 tablets by mouth every 6 (six) hours as needed (pain/headache)., Disp: , Rfl:    amLODipine  (NORVASC ) 10 MG tablet, TAKE 1 TABLET BY MOUTH EVERY DAY, Disp: 90 tablet, Rfl: 1   benazepril  (LOTENSIN ) 20 MG tablet, TAKE 1 TABLET BY MOUTH EVERY DAY, Disp: 90 tablet, Rfl: 1   Besifloxacin HCl 0.6 % SUSP, Place 1 drop into both eyes See admin instructions. Instill one drop into both eyes 4 times daily on the day of and the day after eye injections - once a month, Disp: , Rfl:    bimatoprost (LUMIGAN) 0.01 % SOLN, Place 1 drop into both eyes at bedtime., Disp: , Rfl:    ELIQUIS  2.5 MG TABS tablet, Take 1 tablet (2.5 mg total) by mouth 2 (two) times daily.,  Disp: 180 tablet, Rfl: 3   glucose blood (ACCU-CHEK GUIDE) test strip, USE TO TEST BLOOD SUGAR BID DX E11.9, Disp: 200 strip, Rfl: 3   Lancets Misc. (ACCU-CHEK SOFTCLIX LANCET DEV) KIT, Dx E11.9 Test BS daily, Disp: 1 kit, Rfl: 1   levothyroxine  (SYNTHROID ) 75 MCG tablet, TAKE 1 TABLET BY MOUTH EVERY DAY BEFORE BREAKFAST, Disp: 90 tablet, Rfl: 3   metoprolol  succinate (TOPROL -XL) 50 MG 24 hr tablet, TAKE 1 TABLET (50 MG TOTAL) BY MOUTH DAILY. BRIDGE SUPPLY TAKE WITH OR IMMEDIATELY FOLLOWING A MEAL., Disp: 90 tablet, Rfl: 1   Multiple Vitamin (MULTIVITAMIN WITH MINERALS) TABS tablet, Take 1 tablet by mouth daily., Disp: , Rfl:    Netarsudil Dimesylate (RHOPRESSA) 0.02 % SOLN, Apply to eye., Disp: , Rfl:    omeprazole   (PRILOSEC) 20 MG capsule, TAKE 1 CAPSULE BY MOUTH EVERY DAY, Disp: 90 capsule, Rfl: 1  Current Facility-Administered Medications:    ipratropium-albuterol  (DUONEB) 0.5-2.5 (3) MG/3ML nebulizer solution 3 mL, 3 mL, Nebulization, Once,  Social History   Socioeconomic History   Marital status: Widowed    Spouse name: Not on file   Number of children: Not on file   Years of education: Not on file   Highest education level: Not on file  Occupational History   Not on file  Tobacco Use   Smoking status: Former   Smokeless tobacco: Never   Tobacco comments:    quit smoking 27yrs ago  Vaping Use   Vaping status: Never Used  Substance and Sexual Activity   Alcohol use: No    Alcohol/week: 0.0 standard drinks of alcohol   Drug use: No   Sexual activity: Yes    Birth control/protection: Post-menopausal  Other Topics Concern   Not on file  Social History Narrative   Not on file   Social Drivers of Health   Financial Resource Strain: Not on file  Food Insecurity: Not on file  Transportation Needs: Not on file  Physical Activity: Not on file  Stress: Not on file  Social Connections: Not on file  Intimate Partner Violence: Not on file   Family History  Problem Relation Age of Onset   Prostate cancer Father    Cancer Father    Lung disease Sister    Cancer Brother     Objective: Office vital signs reviewed. BP 134/68   Pulse 64   Temp 97.8 F (36.6 C)   Ht 5' 3 (1.6 m)   Wt 115 lb (52.2 kg)   SpO2 99%   BMI 20.37 kg/m   Physical Examination:  General: Awake, alert, nontoxic-appearing elderly female, No acute distress HEENT: Sclera white.  Moist mucous membranes.  Hearing aids intact bilaterally.  EOMI.  PERRLA. Cardio: regular rate and rhythm, S1S2 heard, no murmurs appreciated Pulm: clear to auscultation bilaterally, no wheezes, rhonchi or rales; normal work of breathing on room air GI: soft, + epigastric tenderness with deep palpation. No guarding or rebound,  non-distended, bowel sounds present x4, no hepatomegaly, no splenomegaly, no masses Skin: dry; intact; no rashes or lesions Neuro: 5/5 UE and LE Strength and light touch sensation grossly intact, cranial nerves II through XII grossly intact except for hearing  Assessment/ Plan: 89 y.o. female   Epigastric pain - Plan: Vitamin B12, TSH + free T4, Alpha-Gal Panel, Food Allergy  Profile, H. pylori antigen, stool  Brain fog - Plan: Vitamin B12, TSH + free T4  Essential hypertension  Concerning for duodenitis given location of discomfort and timing of discomfort  following meals.  Differentials include mesenteric ischemia and given her history of vascular disease it may be something that we have to rule out if she has persistent symptoms despite compliance with PPI.  I did encourage her to go ahead and take the PPI consistently for 4 to 6 weeks.  I am going to do some allergy  testing on her including alpha gal panel and H. pylori testing.  I have added B12 and thyroid  panel to look for issues for brain fog.  Her blood pressure was controlled upon manual recheck today   Norene CHRISTELLA Fielding, DO [facility] [phone]

## 2023-10-09 NOTE — Patient Instructions (Addendum)
 My suspicion is that you have duodenitis/ possible duodenal ulcer.  I want you to resume that pill Nena gave you (omeprazole ) and continue for 6 weeks.  I've ordered blood testing and a stool test to look for an infection called H Pylori that can cause ulcers/ stomach issues.  We will check for food allergy  and further instructions pending results.  Duodenitis  Duodenitis is when the lining of the first part of your small intestine (duodenum) becomes inflamed. It is often caused by a bacterial infection. You may also have open sores in your intestine called ulcers. Duodenitis may start all of a sudden and last for a short time (acute). It may also develop over time and last for months or years (chronic). What are the causes? The most common cause of this condition is an infection from a type of bacteria called Helicobacter pylori (H. pylori). Other causes include: Long-term use of NSAIDs, such as ibuprofen. Using too much alcohol. An infection of the small intestine caused by the Giardia parasite (giardiasis). Crohn's disease. Certain diseases of the body's defense system (immune system). Certain treatments for cancer. What increases the risk? You may be more likely to develop this condition if: You smoke cigarettes. You drink alcohol. You have a family history of duodenitis. You take NSAIDs. You eat a high-fat diet. What are the signs or symptoms? Symptoms of this condition may include: Epigastric pain. This is a gnawing or burning pain in the upper center of your abdomen. It may get worse when your stomach is empty and may get better after you eat. Cramps in your abdomen. Nausea and vomiting. Bloody vomit. Stools that are bloody, dark, or look like tar. Diarrhea. Weight loss. Feeling tired (fatigue). How is this diagnosed? This condition may be diagnosed based on your medical history and a physical exam. You may also have tests, such as: Blood tests. Stool tests. A test that  checks the gases in your breath. An X-ray that is done after you swallow a liquid called barium. The barium makes your digestive tract easier to see. Endoscopy. This is an exam that is done by putting a thin tube with a camera on the end (endoscope) down your throat. A biopsy may be taken. This is when a sample of tissue from your duodenum is removed with the scope and looked at under a microscope to check if the tissue is infected or inflamed. How is this treated? Treatment depends on the cause of your condition. Treatment may include: Antibiotics to treat H. pylori infection. Stopping your intake of NSAIDs. Medicine to reduce the amount of acid your stomach makes. Medicine to treat other conditions, such as Crohn's disease or giardiasis. Surgery to treat severe inflammation that causes scarring or severe bleeding. Follow these instructions at home: Medicines Take over-the-counter and prescription medicines only as told by your health care provider. If you were prescribed antibiotics, take them as told by your health care provider. Do not stop using the antibiotic even if you start to feel better. Eating and drinking  Eat small, frequent meals. Do not drink alcohol. Drink enough water to keep your urine pale yellow. Follow instructions from your health care provider about what you may eat and drink. You may be asked to avoid: Drinks with caffeine. Chocolate. Peppermint or mint-flavored food or drinks. Garlic or onions. Spicy foods. Citrus fruits. Tomato-based foods. Fatty or fried foods. General instructions Do not use any products that contain nicotine or tobacco. These products include cigarettes, chewing tobacco, and  vaping devices, such as e-cigarettes. If you need help quitting, ask your health care provider. Contact a health care provider if: You have a fever. Your symptoms come back, get worse, or do not get better with treatment. You feel dizzy or light-headed. You vomit  blood. You have a lot of blood in your stool. You have severe pain in your abdomen. Your abdomen swells and is painful. This information is not intended to replace advice given to you by your health care provider. Make sure you discuss any questions you have with your health care provider. Document Revised: 09/05/2021 Document Reviewed: 09/05/2021 Elsevier Patient Education  2024 [employer].

## 2023-10-10 ENCOUNTER — Ambulatory Visit: Payer: Self-pay | Admitting: Family Medicine

## 2023-10-10 ENCOUNTER — Telehealth: Payer: Self-pay

## 2023-10-10 DIAGNOSIS — R1013 Epigastric pain: Secondary | ICD-10-CM

## 2023-10-10 NOTE — Telephone Encounter (Signed)
 Copied from CRM #[id number]. Topic: Clinical - Lab/Test Results >> Oct 10, 2023 12:40 PM Avram MATSU wrote: Reason for CRM: I relayed test results to pt

## 2023-10-12 ENCOUNTER — Other Ambulatory Visit

## 2023-10-12 LAB — ALPHA-GAL PANEL
Allergen Lamb IgE: <0.1 kU/L
Beef IgE: 0.21 kU/L — AB
IgE (Immunoglobulin E), Serum: 175 [IU]/mL (ref 6–495)
O215-IgE Alpha-Gal: 0.68 kU/L — AB
Pork IgE: 0.11 kU/L — AB

## 2023-10-12 LAB — FOOD ALLERGY PROFILE
Allergen Corn, IgE: 0.75 kU/L — AB
Clam IgE: <0.1 kU/L
Codfish IgE: <0.1 kU/L
Egg White IgE: <0.1 kU/L
Milk IgE: <0.1 kU/L
Peanut IgE: <0.1 kU/L
Scallop IgE: 0.83 kU/L — AB
Sesame Seed IgE: 0.18 kU/L — AB
Shrimp IgE: <0.1 kU/L
Soybean IgE: <0.1 kU/L
Walnut IgE: <0.1 kU/L
Wheat IgE: <0.1 kU/L

## 2023-10-12 LAB — TSH+FREE T4
Free T4: 1.34 ng/dL (ref 0.82–1.77)
TSH: 0.971 u[IU]/mL (ref 0.450–4.500)

## 2023-10-12 LAB — VITAMIN B12: Vitamin B-12: 616 pg/mL (ref 232–1245)

## 2023-10-14 LAB — H. PYLORI ANTIGEN, STOOL: H pylori Ag, Stl: NEGATIVE

## 2023-10-15 NOTE — Addendum Note (Signed)
 Addended by: JOLINDA NORENE HERO on: 10/15/2023 02:24 PM   Modules accepted: Orders

## 2023-10-16 ENCOUNTER — Ambulatory Visit (HOSPITAL_COMMUNITY)
Admission: RE | Admit: 2023-10-16 | Discharge: 2023-10-16 | Disposition: A | Discharge status: Home / Self Care | Source: Ambulatory Visit | Attending: Family Medicine | Admitting: Family Medicine

## 2023-10-16 ENCOUNTER — Encounter: Payer: Self-pay | Admitting: Gastroenterology

## 2023-10-16 ENCOUNTER — Ambulatory Visit: Payer: Self-pay | Admitting: Family Medicine

## 2023-10-16 ENCOUNTER — Telehealth: Payer: Self-pay

## 2023-10-16 DIAGNOSIS — R1013 Epigastric pain: Secondary | ICD-10-CM | POA: Insufficient documentation

## 2023-10-16 DIAGNOSIS — K551 Chronic vascular disorders of intestine: Secondary | ICD-10-CM

## 2023-10-16 MED ORDER — IOHEXOL 350 MG/ML SOLN
100.0000 mL | Freq: Once | INTRAVENOUS | Status: AC | PRN
  Administered 2023-10-16 (×2): 100 mL via INTRAVENOUS

## 2023-10-16 NOTE — Telephone Encounter (Signed)
 Spoke to patient, patient aware of results

## 2023-10-16 NOTE — Telephone Encounter (Signed)
 Copied from CRM [id number]. Topic: General - Other >> Oct 16, 2023  3:03 PM Sophia H wrote: Reason for CRM: Returning Jenna Ramos's phone call regarding results. Called [facility] - but not available. Please return patients phone call. TY! # [phone]

## 2023-10-22 ENCOUNTER — Other Ambulatory Visit: Payer: Self-pay

## 2023-10-22 DIAGNOSIS — K551 Chronic vascular disorders of intestine: Secondary | ICD-10-CM

## 2023-10-23 ENCOUNTER — Encounter: Payer: Self-pay | Admitting: Vascular Surgery

## 2023-10-23 ENCOUNTER — Ambulatory Visit: Attending: Vascular Surgery | Admitting: Vascular Surgery

## 2023-10-23 ENCOUNTER — Other Ambulatory Visit: Payer: Self-pay

## 2023-10-23 ENCOUNTER — Ambulatory Visit (HOSPITAL_COMMUNITY)
Admission: RE | Admit: 2023-10-23 | Discharge: 2023-10-23 | Disposition: A | Discharge status: Home / Self Care | Source: Ambulatory Visit | Attending: Vascular Surgery | Admitting: Vascular Surgery

## 2023-10-23 VITALS — BP 178/81 | HR 60 | Temp 98.0°F | Resp 18 | Ht 63.0 in | Wt 112.9 lb

## 2023-10-23 DIAGNOSIS — K551 Chronic vascular disorders of intestine: Secondary | ICD-10-CM | POA: Insufficient documentation

## 2023-10-23 NOTE — Progress Notes (Signed)
 Patient name: RESHMA HOEY MRN: [id number] DOB: [birth year] Sex: female  REASON FOR VISIT: Severe SMA stenosis  HPI: ALEDA MADL is a 89 y.o. female with history of hypertension, hyperlipidemia, carotid disease that presents for evaluation of severe SMA stenosis.  Patient is here with her son who provides some of the history.  States since the early summer she has been developing symptoms when she gets pain in her abdomen after eating usually around lunchtime (states she feels ok in the morning).  She then gets lightheaded and dizzy feeling.  States her abdominal pain is all over and generalized..  Usually starts to feel better again in the evening.  Has lost about 4 pounds.  Did get a CTA abdomen pelvis on 10/16/2023 with severe narrowing of the SMA with calcified plaque.  Family states no other etiology for her abdominal pain.  Denies any previous GI workup.   Previously had a left carotid endarterectomy by Dr. Laurence in 2015.  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 10/28/2013    Past Surgical History:  Procedure Laterality Date   APPENDECTOMY     CAROTID ENDARTERECTOMY     cataract surgery Bilateral    COLONOSCOPY     ENDARTERECTOMY Left 02/19/2014   Procedure: ENDARTERECTOMY CAROTID;  Surgeon: Redell LITTIE Laurence, MD;  Location: [facility] OR;  Service: Vascular;   Laterality: Left;   ESOPHAGOGASTRODUODENOSCOPY     TONSILLECTOMY     TOTAL HIP ARTHROPLASTY Left 10/2013   TUBAL LIGATION     WRIST SURGERY     pins and screws    Family History  Problem Relation Age of Onset   Prostate cancer Father    Cancer Father    Lung disease Sister    Cancer Brother     SOCIAL HISTORY: Social History   Tobacco Use   Smoking status: Former   Smokeless tobacco: Never   Tobacco comments:    quit smoking 7yrs ago  Substance Use Topics   Alcohol use: No    Alcohol/week: 0.0 standard drinks of alcohol    Allergies  Allergen Reactions   Aggrenox [Aspirin -Dipyridamole Er] Anaphylaxis    Severe headache   Codeine Other (See Comments)    Feet burn and agitation   Morphine  And Codeine Other (See Comments)    Feet burn and agitation   Gabapentin      Difficulty concentrating    Current Outpatient Medications  Medication Sig Dispense Refill   Accu-Chek Softclix Lancets lancets Dx E11.9 Test BS daily 100 each 3   Acetaminophen  (TYLENOL  PO) Take 1-2 tablets by mouth every 6 (six) hours as needed (pain/headache).     amLODipine  (NORVASC ) 10 MG tablet TAKE 1 TABLET BY MOUTH EVERY DAY 90 tablet  1   benazepril  (LOTENSIN ) 20 MG tablet TAKE 1 TABLET BY MOUTH EVERY DAY 90 tablet 1   Besifloxacin HCl 0.6 % SUSP Place 1 drop into both eyes See admin instructions. Instill one drop into both eyes 4 times daily on the day of and the day after eye injections - once a month     bimatoprost (LUMIGAN) 0.01 % SOLN Place 1 drop into both eyes at bedtime.     ELIQUIS  2.5 MG TABS tablet Take 1 tablet (2.5 mg total) by mouth 2 (two) times daily. 180 tablet 3   glucose blood (ACCU-CHEK GUIDE) test strip USE TO TEST BLOOD SUGAR BID DX E11.9 200 strip 3   Lancets Misc. (ACCU-CHEK SOFTCLIX LANCET DEV) KIT Dx E11.9 Test BS daily 1 kit 1   levothyroxine  (SYNTHROID ) 75 MCG tablet TAKE 1 TABLET BY MOUTH EVERY DAY BEFORE BREAKFAST 90 tablet 3   metoprolol  succinate (TOPROL -XL) 50 MG  24 hr tablet TAKE 1 TABLET (50 MG TOTAL) BY MOUTH DAILY. BRIDGE SUPPLY TAKE WITH OR IMMEDIATELY FOLLOWING A MEAL. 90 tablet 1   Multiple Vitamin (MULTIVITAMIN WITH MINERALS) TABS tablet Take 1 tablet by mouth daily.     Netarsudil Dimesylate (RHOPRESSA) 0.02 % SOLN Apply to eye.     Current Facility-Administered Medications  Medication Dose Route Frequency Provider Last Rate Last Admin   ipratropium-albuterol  (DUONEB) 0.5-2.5 (3) MG/3ML nebulizer solution 3 mL  3 mL Nebulization Once         REVIEW OF SYSTEMS:  [X]  denotes positive finding, [ ]  denotes negative finding Cardiac  Comments:  Chest pain or chest pressure:    Shortness of breath upon exertion:    Short of breath when lying flat:    Irregular heart rhythm:        Vascular    Pain in calf, thigh, or hip brought on by ambulation:    Pain in feet at night that wakes you up from your sleep:     Blood clot in your veins:    Leg swelling:         Pulmonary    Oxygen at home:    Productive cough:     Wheezing:         Neurologic    Sudden weakness in arms or legs:     Sudden numbness in arms or legs:     Sudden onset of difficulty speaking or slurred speech:    Temporary loss of vision in one eye:     Problems with dizziness:         Gastrointestinal    Blood in stool:     Vomited blood:         Genitourinary    Burning when urinating:     Blood in urine:        Psychiatric    Major depression:         Hematologic    Bleeding problems:    Problems with blood clotting too easily:        Skin    Rashes or ulcers:        Constitutional    Fever or chills:      PHYSICAL EXAM: Vitals:   10/23/23 1107  BP: (!) 178/81  Pulse: 60  Resp: 18  Temp: 98 F (36.7 C)  TempSrc: Temporal  SpO2: 100%  Weight: 112 lb 14.4 oz (51.2 kg)  Height: 5' 3 (1.6 m)    GENERAL: The patient is a well-nourished female, in no acute distress.  The vital signs are documented above. CARDIAC: There is a regular rate and  rhythm.  VASCULAR:  Bilateral femoral pulses palpable PULMONARY: No respiratory distress ABDOMEN: Soft and mild generalized tenderness MUSCULOSKELETAL: There are no major deformities or cyanosis. NEUROLOGIC: No focal weakness or paresthesias are detected. SKIN: There are no ulcers or rashes noted. PSYCHIATRIC: The patient has a normal affect.  DATA:   ABDOMINAL VISCERAL  Patient Name:  DONNELL BEAUCHAMP  Date of Exam:   10/23/2023 Medical Rec #: [id number]             Accession #:    [id number] Date of Birth: [birth year]             Patient Gender: F Patient Age:   48 years Exam Location:  [address] Procedure:      VAS US  MESENTERIC Referring Phys: [id number] LONNI PARAS Lenae Wherley   --------------------------------------------------------------------------- -----  Indications: Mesenteric insufficiency. Patient reports daily abdominal              discomfort post meals.  High Risk Factors: Hypertension, hyperlipidemia, past history of smoking.  Limitations: Air/bowel gas and patient discomfort. Comparison Study: NA  Performing Technologist: Nanetta Shad RVT    Examination Guidelines: A complete evaluation includes B-mode imaging, spectral Doppler, color Doppler, and power Doppler as needed of all accessible portions of each vessel. Bilateral testing is considered an integral part of a complete examination. Limited examinations for reoccurring indications may be performed as noted.    Duplex Findings: +----------------------+--------+--------+------+-------------------------- ----+ Mesenteric            PSV cm/sEDV cm/sPlaque           Comments            +----------------------+--------+--------+------+-------------------------- ----+ Aorta Prox               76                     2.7 cm AP x 2.7 cm TRV     +----------------------+--------+--------+------+-------------------------- ----+ Celiac Artery Origin    193      22                                          +----------------------+--------+--------+------+-------------------------- ----+ Celiac Artery Proximal  218      26                                         +----------------------+--------+--------+------+-------------------------- ----+ SMA Origin              422      48                                         +----------------------+--------+--------+------+-------------------------- ----+ SMA Proximal            181      35                                         +----------------------+--------+--------+------+-------------------------- ----+ SMA Mid  51      15                                         +----------------------+--------+--------+------+-------------------------- ----+ SMA Distal               59      5                                          +----------------------+--------+--------+------+-------------------------- ----+ CHA                     199      16          appears prominent, measuring                                              .91 cm and tapers to .46 cm at                                                   area of the liver        +----------------------+--------+--------+------+-------------------------- ----+ Splenic                 259      21          appears prominent, measuring                                                          .65 cm              +----------------------+--------+--------+------+-------------------------- ----+ IMA                     352      16                                         +----------------------+--------+--------+------+-------------------------- ----+          Summary: Largest Aortic Diameter: 2.7 cm   Mesenteric: 70 to 99% stenosis in the celiac artery and superior mesenteric artery. The Inferior Mesenteric artery appears stenotic. Elevated velocities in the splenic and hepatic  arteries.    Assessment/Plan:   89 y.o. female with history of hypertension, hyperlipidemia, carotid disease that presents for evaluation of severe SMA stenosis with ongoing abdominal pain.  Ultimately I reviewed her CTA and this does show some calcified plaque in the proximal SMA at the ositum.  Duplex confirms a high-grade SMA stenosis with a velocity of 422/48.  Also concern for IMA stenosis.  I have recommended mesenteric duplex with possible intervention including angioplasty and stent with initial focus on the SMA.  Risk benefits discussed.  I discussed this is really to help with her postprandial pain and weight loss  by improving mesenteric flow.  All of her symptoms are not classic for mesenteric ischemia as we discussed and not sure what to make of the lightheadedness and dizzy feeling that she gets which we discussed in detail.  She tells me today that this usually starts after the abdominal pain and the abdominal pain comes first.  Will get scheduled in the Cath Lab.  Transfemoral approach.  I discussed if she sees no improvement after mesenteric intervention would need GI workup   Lonni DOROTHA Gaskins, MD [facility] Office: [phone]

## 2023-10-30 ENCOUNTER — Encounter (INDEPENDENT_AMBULATORY_CARE_PROVIDER_SITE_OTHER): Admitting: Ophthalmology

## 2023-10-30 DIAGNOSIS — H353231 Exudative age-related macular degeneration, bilateral, with active choroidal neovascularization: Secondary | ICD-10-CM

## 2023-10-30 DIAGNOSIS — H43813 Vitreous degeneration, bilateral: Secondary | ICD-10-CM | POA: Diagnosis not present

## 2023-10-30 DIAGNOSIS — I10 Essential (primary) hypertension: Secondary | ICD-10-CM | POA: Diagnosis not present

## 2023-10-30 DIAGNOSIS — H35033 Hypertensive retinopathy, bilateral: Secondary | ICD-10-CM

## 2023-11-01 ENCOUNTER — Encounter (INDEPENDENT_AMBULATORY_CARE_PROVIDER_SITE_OTHER): Admitting: Ophthalmology

## 2023-11-01 ENCOUNTER — Other Ambulatory Visit: Payer: Self-pay

## 2023-11-01 ENCOUNTER — Encounter (HOSPITAL_COMMUNITY)
Admission: RE | Disposition: A | Discharge status: Home / Self Care | Payer: Self-pay | Source: Home / Self Care | Attending: Vascular Surgery

## 2023-11-01 ENCOUNTER — Ambulatory Visit (HOSPITAL_COMMUNITY)
Admission: RE | Admit: 2023-11-01 | Discharge: 2023-11-01 | Disposition: A | Discharge status: Home / Self Care | Attending: Vascular Surgery | Admitting: Vascular Surgery

## 2023-11-01 DIAGNOSIS — I10 Essential (primary) hypertension: Secondary | ICD-10-CM | POA: Insufficient documentation

## 2023-11-01 DIAGNOSIS — Z87891 Personal history of nicotine dependence: Secondary | ICD-10-CM | POA: Diagnosis not present

## 2023-11-01 DIAGNOSIS — Z7901 Long term (current) use of anticoagulants: Secondary | ICD-10-CM | POA: Insufficient documentation

## 2023-11-01 DIAGNOSIS — I6529 Occlusion and stenosis of unspecified carotid artery: Secondary | ICD-10-CM | POA: Insufficient documentation

## 2023-11-01 DIAGNOSIS — Z7902 Long term (current) use of antithrombotics/antiplatelets: Secondary | ICD-10-CM | POA: Diagnosis not present

## 2023-11-01 DIAGNOSIS — Z79899 Other long term (current) drug therapy: Secondary | ICD-10-CM | POA: Diagnosis not present

## 2023-11-01 DIAGNOSIS — E785 Hyperlipidemia, unspecified: Secondary | ICD-10-CM | POA: Insufficient documentation

## 2023-11-01 DIAGNOSIS — K551 Chronic vascular disorders of intestine: Secondary | ICD-10-CM | POA: Diagnosis present

## 2023-11-01 HISTORY — PX: VISCERAL ANGIOGRAPHY: CATH118276

## 2023-11-01 HISTORY — PX: ABDOMINAL AORTOGRAM: CATH118222

## 2023-11-01 HISTORY — PX: VISCERAL ARTERY INTERVENTION: CATH118277

## 2023-11-01 LAB — POCT I-STAT, CHEM 8
BUN: 18 mg/dL (ref 8–23)
Calcium, Ion: 1.29 mmol/L (ref 1.15–1.40)
Chloride: 101 mmol/L (ref 98–111)
Creatinine, Ser: 0.8 mg/dL (ref 0.44–1.00)
Glucose, Bld: 175 mg/dL — ABNORMAL HIGH (ref 70–99)
HCT: 37 % (ref 36.0–46.0)
Hemoglobin: 12.6 g/dL (ref 12.0–15.0)
Potassium: 3.9 mmol/L (ref 3.5–5.1)
Sodium: 141 mmol/L (ref 135–145)
TCO2: 28 mmol/L (ref 22–32)

## 2023-11-01 SURGERY — ABDOMINAL AORTOGRAM
Anesthesia: LOCAL

## 2023-11-01 MED ORDER — ONDANSETRON HCL 4 MG/2ML IJ SOLN: 4.0000 mg | Freq: Four times a day (QID) | INTRAMUSCULAR | Status: DC | PRN

## 2023-11-01 MED ORDER — CLOPIDOGREL BISULFATE 75 MG PO TABS: 300.0000 mg | ORAL_TABLET | Freq: Once | ORAL | Status: DC

## 2023-11-01 MED ORDER — HEPARIN SODIUM (PORCINE) 1000 UNIT/ML IJ SOLN
INTRAMUSCULAR | Status: AC
  Filled 2023-11-01: qty 10

## 2023-11-01 MED ORDER — CLOPIDOGREL BISULFATE 300 MG PO TABS
ORAL_TABLET | ORAL | Status: AC
  Filled 2023-11-01: qty 1

## 2023-11-01 MED ORDER — MIDAZOLAM HCL 2 MG/2ML IJ SOLN
INTRAMUSCULAR | Status: AC
  Filled 2023-11-01: qty 2

## 2023-11-01 MED ORDER — ATORVASTATIN CALCIUM 10 MG PO TABS
20.0000 mg | ORAL_TABLET | Freq: Every day | ORAL | Status: DC
  Administered 2023-11-01: 20 mg via ORAL
  Filled 2023-11-01: qty 2

## 2023-11-01 MED ORDER — CLOPIDOGREL BISULFATE 75 MG PO TABS
75.0000 mg | ORAL_TABLET | Freq: Every day | ORAL | 11 refills | Status: DC
Start: 2023-11-01 — End: 2023-11-13

## 2023-11-01 MED ORDER — FENTANYL CITRATE (PF) 100 MCG/2ML IJ SOLN
INTRAMUSCULAR | Status: DC | PRN
  Administered 2023-11-01: 25 ug via INTRAVENOUS

## 2023-11-01 MED ORDER — CLOPIDOGREL BISULFATE 75 MG PO TABS: 75.0000 mg | ORAL_TABLET | Freq: Every day | ORAL | Status: DC

## 2023-11-01 MED ORDER — SODIUM CHLORIDE 0.9% FLUSH: 3.0000 mL | INTRAVENOUS | Status: DC | PRN

## 2023-11-01 MED ORDER — SODIUM CHLORIDE 0.9 % IV SOLN
250.0000 mL | INTRAVENOUS | Status: DC | PRN
Start: 2023-11-01 — End: 2023-11-01

## 2023-11-01 MED ORDER — LIDOCAINE HCL (PF) 1 % IJ SOLN
INTRAMUSCULAR | Status: DC | PRN
  Administered 2023-11-01: 5 mL via INTRADERMAL

## 2023-11-01 MED ORDER — SODIUM CHLORIDE 0.9 % IV SOLN: INTRAVENOUS | Status: DC

## 2023-11-01 MED ORDER — ACETAMINOPHEN 325 MG PO TABS
650.0000 mg | ORAL_TABLET | ORAL | Status: DC | PRN
Start: 2023-11-01 — End: 2023-11-01

## 2023-11-01 MED ORDER — FENTANYL CITRATE (PF) 100 MCG/2ML IJ SOLN
INTRAMUSCULAR | Status: AC
  Filled 2023-11-01: qty 2

## 2023-11-01 MED ORDER — IODIXANOL 320 MG/ML IV SOLN
INTRAVENOUS | Status: DC | PRN
  Administered 2023-11-01: 45 mL via INTRA_ARTERIAL

## 2023-11-01 MED ORDER — ATORVASTATIN CALCIUM 20 MG PO TABS: 20.0000 mg | ORAL_TABLET | Freq: Every day | ORAL | 11 refills | Status: DC

## 2023-11-01 MED ORDER — HEPARIN SODIUM (PORCINE) 1000 UNIT/ML IJ SOLN
INTRAMUSCULAR | Status: DC | PRN
Start: 2023-11-01 — End: 2023-11-01
  Administered 2023-11-01: 6000 [IU] via INTRAVENOUS

## 2023-11-01 MED ORDER — LABETALOL HCL 5 MG/ML IV SOLN: 10.0000 mg | INTRAVENOUS | Status: DC | PRN

## 2023-11-01 MED ORDER — SODIUM CHLORIDE 0.9% FLUSH: 3.0000 mL | Freq: Two times a day (BID) | INTRAVENOUS | Status: DC

## 2023-11-01 MED ORDER — LIDOCAINE HCL (PF) 1 % IJ SOLN
INTRAMUSCULAR | Status: AC
  Filled 2023-11-01: qty 30

## 2023-11-01 MED ORDER — CLOPIDOGREL BISULFATE 300 MG PO TABS
ORAL_TABLET | ORAL | Status: DC | PRN
  Administered 2023-11-01: 300 mg via ORAL

## 2023-11-01 MED ORDER — HYDRALAZINE HCL 20 MG/ML IJ SOLN: 5.0000 mg | INTRAMUSCULAR | Status: DC | PRN

## 2023-11-01 MED ORDER — MIDAZOLAM HCL 2 MG/2ML IJ SOLN
INTRAMUSCULAR | Status: DC | PRN
  Administered 2023-11-01: 1 mg via INTRAVENOUS

## 2023-11-01 MED ORDER — HEPARIN (PORCINE) IN NACL 1000-0.9 UT/500ML-% IV SOLN
INTRAVENOUS | Status: DC | PRN
  Administered 2023-11-01 (×2): 500 mL

## 2023-11-01 SURGICAL SUPPLY — 17 items
CATH ANGIO 5F PIGTAIL 100CM (CATHETERS) IMPLANT
CATH OMNI FLUSH 5F 65CM (CATHETERS) IMPLANT
COVER DOME SNAP 22 D (MISCELLANEOUS) IMPLANT
DEVICE CLOSURE MYNXGRIP 6/7F (Vascular Products) IMPLANT
GLIDEWIRE ADV .035X260CM (WIRE) IMPLANT
KIT ENCORE 26 ADVANTAGE (KITS) IMPLANT
KIT MICROPUNCTURE NIT STIFF (SHEATH) IMPLANT
KIT SINGLE USE MANIFOLD (KITS) IMPLANT
KIT SYRINGE INJ CVI SPIKEX1 (MISCELLANEOUS) IMPLANT
SET ATX-X65L (MISCELLANEOUS) IMPLANT
SHEATH INTROD PASSPORT 45 6.5F (SHEATH) IMPLANT
SHEATH PINNACLE 5F 10CM (SHEATH) IMPLANT
SHEATH PINNACLE 7F 10CM (SHEATH) IMPLANT
SHEATH PROBE COVER 6X72 (BAG) IMPLANT
STENT VIABAHN 6X19 6FR 135 (Permanent Stent) IMPLANT
TRAY PV CATH (CUSTOM PROCEDURE TRAY) ×1 IMPLANT
WIRE BENTSON .035X145CM (WIRE) IMPLANT

## 2023-11-01 NOTE — H&P (Signed)
 History and Physical Interval Note:  11/01/2023 7:47 AM  Jenna Ramos  has presented today for surgery, with the diagnosis of chronic mesenteric ischemia.  The various methods of treatment have been discussed with the patient and family. After consideration of risks, benefits and other options for treatment, the patient has consented to  Procedure(s): ABDOMINAL AORTOGRAM (N/A) Lower Extremity Angiography (N/A) VISCERAL ARTERY INTERVENTION (N/A) as a surgical intervention.  The patient's history has been reviewed, patient examined, no change in status, stable for surgery.  I have reviewed the patient's chart and labs.  Questions were answered to the patient's satisfaction.     Lonni JINNY Gaskins     Patient name: Jenna Ramos MRN: [id number]        DOB: 1928-08-18          Sex: female   REASON FOR VISIT: Severe SMA stenosis   HPI: Jenna Ramos is a 89 y.o. female with history of hypertension, hyperlipidemia, carotid disease that presents for evaluation of severe SMA stenosis.  Patient is here with her son who provides some of the history.  States since the early summer she has been developing symptoms when she gets pain in her abdomen after eating usually around lunchtime (states she feels ok in the morning).  She then gets lightheaded and dizzy feeling.  States her abdominal pain is all over and generalized..  Usually starts to feel better again in the evening.  Has lost about 4 pounds.  Did get a CTA abdomen pelvis on 10/16/2023 with severe narrowing of the SMA with calcified plaque.  Family states no other etiology for her abdominal pain.  Denies any previous GI workup.     Previously had a left carotid endarterectomy by Dr. Laurence in 2015.       Past Medical History:  Diagnosis Date   Anemia      takes Iron  pill daily   Arthritis     Carotid artery occlusion     Fibromyalgia     Fibromyalgia     Glaucoma      uses Eye Drops daily   Hip fracture requiring  operative repair (HCC) 10/17/2013   HOH (hard of hearing)     Hyperlipidemia      takes Atorvastatin  daily   Hypertension      takes Benazepril  daily   Hypothyroidism      takes Synthroid  daily   Macular degeneration      wet and gets injections in both eyes   Nocturia     Peripheral vascular disease (HCC)     Rectal incontinence     Stroke (HCC)     TIA (transient ischemic attack) 01/23/2014   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 10/28/2013               Past Surgical History:  Procedure Laterality Date   APPENDECTOMY       CAROTID ENDARTERECTOMY       cataract surgery Bilateral     COLONOSCOPY       ENDARTERECTOMY Left 02/19/2014    Procedure: ENDARTERECTOMY CAROTID;  Surgeon: Redell LITTIE Laurence, MD;  Location: [facility] OR;  Service: Vascular;  Laterality: Left;   ESOPHAGOGASTRODUODENOSCOPY       TONSILLECTOMY       TOTAL HIP ARTHROPLASTY Left 10/2013   TUBAL LIGATION       WRIST SURGERY        pins and screws  Family History  Problem Relation Age of Onset   Prostate cancer Father     Cancer Father     Lung disease Sister     Cancer Brother            SOCIAL HISTORY: Social History         Tobacco Use   Smoking status: Former   Smokeless tobacco: Never   Tobacco comments:      quit smoking 40yrs ago  Substance Use Topics   Alcohol use: No      Alcohol/week: 0.0 standard drinks of alcohol      Allergies       Allergies  Allergen Reactions   Aggrenox [Aspirin -Dipyridamole Er] Anaphylaxis      Severe headache   Codeine Other (See Comments)      Feet burn and agitation   Morphine  And Codeine Other (See Comments)      Feet burn and agitation   Gabapentin         Difficulty concentrating              Current Outpatient Medications  Medication Sig Dispense Refill   Accu-Chek Softclix Lancets lancets Dx E11.9 Test BS daily 100 each 3   Acetaminophen  (TYLENOL  PO) Take 1-2 tablets by mouth every 6 (six) hours as needed  (pain/headache).       amLODipine  (NORVASC ) 10 MG tablet TAKE 1 TABLET BY MOUTH EVERY DAY 90 tablet 1   benazepril  (LOTENSIN ) 20 MG tablet TAKE 1 TABLET BY MOUTH EVERY DAY 90 tablet 1   Besifloxacin HCl 0.6 % SUSP Place 1 drop into both eyes See admin instructions. Instill one drop into both eyes 4 times daily on the day of and the day after eye injections - once a month       bimatoprost (LUMIGAN) 0.01 % SOLN Place 1 drop into both eyes at bedtime.       ELIQUIS  2.5 MG TABS tablet Take 1 tablet (2.5 mg total) by mouth 2 (two) times daily. 180 tablet 3   glucose blood (ACCU-CHEK GUIDE) test strip USE TO TEST BLOOD SUGAR BID DX E11.9 200 strip 3   Lancets Misc. (ACCU-CHEK SOFTCLIX LANCET DEV) KIT Dx E11.9 Test BS daily 1 kit 1   levothyroxine  (SYNTHROID ) 75 MCG tablet TAKE 1 TABLET BY MOUTH EVERY DAY BEFORE BREAKFAST 90 tablet 3   metoprolol  succinate (TOPROL -XL) 50 MG 24 hr tablet TAKE 1 TABLET (50 MG TOTAL) BY MOUTH DAILY. BRIDGE SUPPLY TAKE WITH OR IMMEDIATELY FOLLOWING A MEAL. 90 tablet 1   Multiple Vitamin (MULTIVITAMIN WITH MINERALS) TABS tablet Take 1 tablet by mouth daily.       Netarsudil Dimesylate (RHOPRESSA) 0.02 % SOLN Apply to eye.                   Current Facility-Administered Medications  Medication Dose Route Frequency Provider Last Rate Last Admin   ipratropium-albuterol  (DUONEB) 0.5-2.5 (3) MG/3ML nebulizer solution 3 mL  3 mL Nebulization Once              REVIEW OF SYSTEMS:  [X]  denotes positive finding, [ ]  denotes negative finding Cardiac   Comments:  Chest pain or chest pressure:      Shortness of breath upon exertion:      Short of breath when lying flat:      Irregular heart rhythm:             Vascular      Pain in calf, thigh, or hip  brought on by ambulation:      Pain in feet at night that wakes you up from your sleep:       Blood clot in your veins:      Leg swelling:              Pulmonary      Oxygen at home:      Productive cough:       Wheezing:               Neurologic      Sudden weakness in arms or legs:       Sudden numbness in arms or legs:       Sudden onset of difficulty speaking or slurred speech:      Temporary loss of vision in one eye:       Problems with dizziness:              Gastrointestinal      Blood in stool:       Vomited blood:              Genitourinary      Burning when urinating:       Blood in urine:             Psychiatric      Major depression:              Hematologic      Bleeding problems:      Problems with blood clotting too easily:             Skin      Rashes or ulcers:             Constitutional      Fever or chills:          PHYSICAL EXAM:    Vitals:    10/23/23 1107  BP: (!) 178/81  Pulse: 60  Resp: 18  Temp: 98 F (36.7 C)  TempSrc: Temporal  SpO2: 100%  Weight: 112 lb 14.4 oz (51.2 kg)  Height: 5' 3 (1.6 m)      GENERAL: The patient is a well-nourished female, in no acute distress. The vital signs are documented above. CARDIAC: There is a regular rate and rhythm.  VASCULAR:  Bilateral femoral pulses palpable PULMONARY: No respiratory distress ABDOMEN: Soft and mild generalized tenderness MUSCULOSKELETAL: There are no major deformities or cyanosis. NEUROLOGIC: No focal weakness or paresthesias are detected. SKIN: There are no ulcers or rashes noted. PSYCHIATRIC: The patient has a normal affect.   DATA:    ABDOMINAL VISCERAL  Patient Name:  Jenna Ramos  Date of Exam:   10/23/2023 Medical Rec #: [id number]             Accession #:    [id number] Date of Birth: 1928-04-01             Patient Gender: F Patient Age:   31 years Exam Location:  [address] Procedure:      VAS US  MESENTERIC Referring Phys: [id number] LONNI PARAS Yoshi Mancillas   --------------------------------------------------------------------------- -----  Indications: Mesenteric insufficiency. Patient reports daily abdominal              discomfort post meals.  High Risk Factors:  Hypertension, hyperlipidemia, past history of smoking.  Limitations: Air/bowel gas and patient discomfort. Comparison Study: NA  Performing Technologist: Nanetta Shad RVT    Examination Guidelines: A complete evaluation includes B-mode imaging, spectral Doppler, color Doppler, and power Doppler as  needed of all accessible portions of each vessel. Bilateral testing is considered an integral part of a complete examination. Limited examinations for reoccurring indications may be performed as noted.    Duplex Findings: +----------------------+--------+--------+------+-------------------------- ----+ Mesenteric            PSV cm/sEDV cm/sPlaque           Comments            +----------------------+--------+--------+------+-------------------------- ----+ Aorta Prox               76                     2.7 cm AP x 2.7 cm TRV     +----------------------+--------+--------+------+-------------------------- ----+ Celiac Artery Origin    193      22                                         +----------------------+--------+--------+------+-------------------------- ----+ Celiac Artery Proximal  218      26                                         +----------------------+--------+--------+------+-------------------------- ----+ SMA Origin              422      48                                         +----------------------+--------+--------+------+-------------------------- ----+ SMA Proximal            181      35                                         +----------------------+--------+--------+------+-------------------------- ----+ SMA Mid                  51      15                                         +----------------------+--------+--------+------+-------------------------- ----+ SMA Distal               59      5                                           +----------------------+--------+--------+------+-------------------------- ----+ CHA                     199      16          appears prominent, measuring                                              .91 cm and tapers to .46 cm at  area of the liver        +----------------------+--------+--------+------+-------------------------- ----+ Splenic                 259      21          appears prominent, measuring                                                          .65 cm              +----------------------+--------+--------+------+-------------------------- ----+ IMA                     352      16                                         +----------------------+--------+--------+------+-------------------------- ----+          Summary: Largest Aortic Diameter: 2.7 cm   Mesenteric: 70 to 99% stenosis in the celiac artery and superior mesenteric artery. The Inferior Mesenteric artery appears stenotic. Elevated velocities in the splenic and hepatic arteries.      Assessment/Plan:    89 y.o. female with history of hypertension, hyperlipidemia, carotid disease that presents for evaluation of severe SMA stenosis with ongoing abdominal pain.  Ultimately I reviewed her CTA and this does show some calcified plaque in the proximal SMA at the ositum.  Duplex confirms a high-grade SMA stenosis with a velocity of 422/48.  Also concern for IMA stenosis.  I have recommended mesenteric duplex with possible intervention including angioplasty and stent with initial focus on the SMA.  Risk benefits discussed.  I discussed this is really to help with her postprandial pain and weight loss by improving mesenteric flow.  All of her symptoms are not classic for mesenteric ischemia as we discussed and not sure what to make of the lightheadedness and dizzy feeling that she gets which we discussed in detail.  She  tells me today that this usually starts after the abdominal pain and the abdominal pain comes first.  Will get scheduled in the Cath Lab.  Transfemoral approach.  I discussed if she sees no improvement after mesenteric intervention would need GI workup     Lonni DOROTHA Gaskins, MD [facility] Office: [phone]

## 2023-11-01 NOTE — Op Note (Signed)
    Patient name: Jenna Ramos MRN: [id number] DOB: 10-13-1928 Sex: female  11/01/2023 Pre-operative Diagnosis: Chronic mesenteric ischemia Post-operative diagnosis:  Same Surgeon:  Lonni DOROTHA Gaskins, MD Procedure Performed: 1.  Ultrasound-guided access right common femoral artery 2.  Aortogram with catheter selection of aorta including selection of the SMA 3.  SMA angioplasty with stent placement (6 mm x 19 mm VBX) 4.  Mynx closure of the right common femoral artery 5.  32 minutes of monitored moderate conscious sedation time  Indications: Patient is a 89 year old female seen with symptoms consistent with chronic mesenteric ischemia.  CTA and duplex showed a high-grade SMA stenosis.  She presents for angiogram with SMA intervention after risk benefits discussed.  Findings:   Ultrasound-guided access of right common femoral artery.  Aortogram showed high-grade calcified stenosis in the proximal SMA at the ostium over 80%.  This was then selected with a Glidewire advantage using a steerable sheath.  It was stented with a 6 mm x 19 mm VBX.  Widely patent stent at completion.  Good filling of the SMA distally.  No residual stenosis.     Procedure:  The patient was identified in the holding area and taken to room 8.  The patient was then placed supine on the table and prepped and draped in the usual sterile fashion.  A time out was called.  Patient received Versed  and fentanyl  for conscious moderate sedation.  Vital signs are monitored including heart rate, respiratory rate, oxygenation and blood pressure.  I was present for all of moderate sedation.  Ultrasound was used to evaluate the right common femoral artery.  It was patent .  A digital ultrasound image was acquired.  A micropuncture needle was used to access the right common femoral artery under ultrasound guidance.  An 018 wire was advanced without resistance and a micropuncture sheath was placed.  The 018 wire was removed and a  benson wire was placed.  The micropuncture sheath was exchanged for a 5 french sheath.  An omniflush catheter was advanced over the wire to the level of L-1.  An abdominal angiogram was obtained.  This confirmed high-grade proximal SMA stenosis as noted on duplex and CTA.  I then used a Glidewire advantage and exchanged for a 6.5 [place] steerable sheath.  The patient was given 100 units/kg IV heparin .  I then used the Glidewire advantage with the steerable sheath to cannulate the SMA.  The proximal SMA was then stented with a 6 mm x 19 mm VBX.  We deployed the stent at nominal pressure.  Final imaging showed widely patent stent with filling of the SMA.  No significant residual stenosis.  Wires and catheters were removed.  I placed a short 7 French sheath in the right groin.  A right groin shot was obtained.  Mynx closure was deployed.  Taken to holding in stable condition.  Plan: Excellent results after SMA stenting.  Plan Plavix  plus already on Eliquis .  She does have aspirin  allergy .  Statin therapy ordered as well.  Will arrange follow-up in 1 month with mesenteric duplex    Lonni DOROTHA Gaskins, MD [facility]: [phone]

## 2023-11-01 NOTE — Progress Notes (Signed)
 Discharge instructions reviewed with patient and son at bedside Denies questions or concerns. PT ambulated with staff. Incison site remain c/d/I. PT was escorted from unit via wheel chair to personal vehicle.

## 2023-11-01 NOTE — Discharge Instructions (Addendum)
 Excellent results after SMA stenting today.  New prescription for Plavix  and statin daily.  Resume Eliquis  postprocedure day 1.  1 month follow-up with our office will be arranged.  Femoral Site Care This sheet gives you information about how to care for yourself after your procedure. Your health care provider may also give you more specific instructions. If you have problems or questions, contact your health care provider. What can I expect after the procedure?  After the procedure, it is common to have: Bruising that usually fades within 1-2 weeks. Tenderness at the site. Follow these instructions at home: Wound care Follow instructions from your health care provider about how to take care of your insertion site. Make sure you: Wash your hands with soap and water before you change your bandage (dressing). If soap and water are not available, use hand sanitizer. Remove your dressing as told by your health care provider. In 24 hours Do not take baths, swim, or use a hot tub until your health care provider approves. You may shower 24-48 hours after the procedure or as told by your health care provider. Gently wash the site with plain soap and water. Pat the area dry with a clean towel. Do not rub the site. This may cause bleeding. Do not apply powder or lotion to the site. Keep the site clean and dry. Check your femoral site every day for signs of infection. Check for: Redness, swelling, or pain. Fluid or blood. Warmth. Pus or a bad smell. Activity For the first 2-3 days after your procedure, or as long as directed: Avoid climbing stairs as much as possible. Do not squat. Do not lift anything that is heavier than 10 lb (4.5 kg), or the limit that you are told, until your health care provider says that it is safe. For 5 days Rest as directed. Avoid sitting for a long time without moving. Get up to take short walks every 1-2 hours. Do not drive for 24 hours if you were given a medicine  to help you relax (sedative). General instructions Take over-the-counter and prescription medicines only as told by your health care provider. Keep all follow-up visits as told by your health care provider. This is important. Contact a health care provider if you have: A fever or chills. You have redness, swelling, or pain around your insertion site. Get help right away if: The catheter insertion area swells very fast. You pass out. You suddenly start to sweat or your skin gets clammy. The catheter insertion area is bleeding, and the bleeding does not stop when you hold steady pressure on the area. The area near or just beyond the catheter insertion site becomes pale, cool, tingly, or numb. These symptoms may represent a serious problem that is an emergency. Do not wait to see if the symptoms will go away. Get medical help right away. Call your local emergency services (911 in the [place] Do not drive yourself to the hospital. Summary After the procedure, it is common to have bruising that usually fades within 1-2 weeks. Check your femoral site every day for signs of infection. Do not lift anything that is heavier than 10 lb (4.5 kg), or the limit that you are told, until your health care provider says that it is safe. This information is not intended to replace advice given to you by your health care provider. Make sure you discuss any questions you have with your health care provider. Document Revised: 03/05/2017 Document Reviewed: 03/05/2017 Elsevier Patient Education  2020 [employer]

## 2023-11-02 ENCOUNTER — Encounter (HOSPITAL_COMMUNITY): Payer: Self-pay | Admitting: Vascular Surgery

## 2023-11-08 ENCOUNTER — Telehealth: Payer: Self-pay

## 2023-11-08 ENCOUNTER — Ambulatory Visit: Payer: Self-pay

## 2023-11-08 NOTE — Telephone Encounter (Signed)
 Copied from CRM [id number]. Topic: Appointments - Appointment Scheduling >> Nov 08, 2023  9:58 AM Emylou G wrote: since procedure last tuesday hasn't been feeling well.. she has pains in her back.. she is unsure what is going on..would like to see her PCP if possible ( she is aware of her procedure )

## 2023-11-08 NOTE — Telephone Encounter (Signed)
 FYI Only or Action Required?: FYI only for provider.  Patient was last seen in primary care on 10/09/2023 by Jolinda Norene HERO, DO.  Called Nurse Triage reporting Post-op Problem.  Symptoms began several days ago.  Interventions attempted: Rest, hydration, or home remedies.  Symptoms are: gradually worsening.  Triage Disposition: Go to ED Now (or PCP Triage)  Patient/caregiver understands and will follow disposition?: no  Copied from CRM #[id number]. Topic: Clinical - Red Word Triage >> Nov 08, 2023  9:23 AM Zane F wrote: Red Word that prompted transfer to Nurse Triage:   Patient had a stint put in for a blocked artery (in abdomen);Patient states she has been having back pain off and on (acute back pain presently); dizziness (half an hour) Reason for Disposition  Sounds like a serious complication to the triager  Answer Assessment - Initial Assessment Questions 1. SYMPTOM: What's the main symptom you're concerned about? (e.g., pain, fever, vomiting)     Back pain-like I did to much sweeping 2. ONSET: When did back pain  start?     About 5 days ago 3. SURGERY: What surgery did you have?     Stent placed in artery 4. DATE of SURGERY: When was the surgery?      08/28 7. PAIN: Is there any pain? If Yes, ask: How bad is it?  (Scale 0-10; or none, mild, moderate, severe)     Pain in back relieved by being prone 11. OTHER SYMPTOMS: Do you have any other symptoms? (e.g., drainage from wound, painful urination, constipation)       Constipation, loss of bowel control, urine increase output, loss of appetite, dizzy when standing  Pt advised to call specialists office that performed procedure, pt states that she is not sure she has the number. Pt began walking to fine number in her home, states I'm to dizzy to be up and moving around. Pt advised to go to call 911 for transport to ED. Pt refused at time of call.  Protocols used: Post-Op Symptoms and Questions-A-AH

## 2023-11-08 NOTE — Telephone Encounter (Signed)
 Duplicate message

## 2023-11-09 NOTE — Telephone Encounter (Signed)
 Can the pt wait until the 9/9 appt with Dr. KANDICE for eval?

## 2023-11-09 NOTE — Telephone Encounter (Signed)
 Pt made aware. States that she is feeling 75% better and will wait until 9/9 appt. Pt aware to go to ED if she becomes worse. She states that it is hard to get in touch with surgeons office and will wait to see Dr. KANDICE.

## 2023-11-09 NOTE — Telephone Encounter (Signed)
 Needs to contact surgeon

## 2023-11-13 ENCOUNTER — Ambulatory Visit (INDEPENDENT_AMBULATORY_CARE_PROVIDER_SITE_OTHER): Admitting: Family Medicine

## 2023-11-13 ENCOUNTER — Encounter: Payer: Self-pay | Admitting: Family Medicine

## 2023-11-13 VITALS — BP 102/48 | HR 56 | Temp 97.9°F | Ht 63.0 in | Wt 115.4 lb

## 2023-11-13 DIAGNOSIS — I480 Paroxysmal atrial fibrillation: Secondary | ICD-10-CM

## 2023-11-13 DIAGNOSIS — E034 Atrophy of thyroid (acquired): Secondary | ICD-10-CM | POA: Diagnosis not present

## 2023-11-13 DIAGNOSIS — K551 Chronic vascular disorders of intestine: Secondary | ICD-10-CM | POA: Diagnosis not present

## 2023-11-13 DIAGNOSIS — I152 Hypertension secondary to endocrine disorders: Secondary | ICD-10-CM

## 2023-11-13 DIAGNOSIS — E1159 Type 2 diabetes mellitus with other circulatory complications: Secondary | ICD-10-CM | POA: Diagnosis not present

## 2023-11-13 DIAGNOSIS — Z23 Encounter for immunization: Secondary | ICD-10-CM | POA: Diagnosis not present

## 2023-11-13 MED ORDER — LEVOTHYROXINE SODIUM 75 MCG PO TABS: 75.0000 ug | ORAL_TABLET | Freq: Every day | ORAL | 3 refills | Status: AC

## 2023-11-13 MED ORDER — METOPROLOL SUCCINATE ER 50 MG PO TB24
50.0000 mg | ORAL_TABLET | Freq: Every day | ORAL | 3 refills | Status: AC
Start: 2023-11-13 — End: ?

## 2023-11-13 MED ORDER — AMLODIPINE BESYLATE 10 MG PO TABS: 10.0000 mg | ORAL_TABLET | Freq: Every day | ORAL | 3 refills | Status: AC

## 2023-11-13 MED ORDER — BENAZEPRIL HCL 20 MG PO TABS: 20.0000 mg | ORAL_TABLET | Freq: Every day | ORAL | 3 refills | Status: AC

## 2023-11-13 MED ORDER — CLOPIDOGREL BISULFATE 75 MG PO TABS
75.0000 mg | ORAL_TABLET | Freq: Every day | ORAL | 3 refills | Status: AC
Start: 2023-11-13 — End: ?

## 2023-11-13 MED ORDER — ATORVASTATIN CALCIUM 20 MG PO TABS: 20.0000 mg | ORAL_TABLET | Freq: Every day | ORAL | 3 refills | Status: AC

## 2023-11-13 NOTE — Progress Notes (Signed)
 Subjective: [name] pain follow up PCP: Jenna Norene HERO, DO YEP:[name] is a 89 y.o. female presenting to clinic today for:  Discussed the use of AI scribe software for clinical note transcription with the patient, who gave verbal consent to proceed.  History of Present Illness   Jenna Ramos is a 89 year old female with chronic mesenteric ischemia.  Last month, she experienced severe epigastric pain and underwent a CT angiography of the abdomen and pelvis, which revealed severe mesenteric ischemia. She was urgently referred to a vascular surgeon, who told her she had chronic mesenteric ischemia. Subsequently, she underwent a vascular procedure and she notes today that her symptoms have resolved. Following the procedure, she experienced back pain lasting about six days, which has since improved. Despite the back pain, she reports significant improvement in her overall condition, feeling 'like a new person' and resuming activities such as going to the market and working on a Geneticist, molecular.  She no longer experiences abdominal pain and reports normal eating, drinking, and bowel movements, although she continues to have occasional constipation, a lifelong issue. She also reports no more dizzy spells, which she previously experienced when bending over and standing up.  Her current medications include Eliquis , clopidogrel , and atorvastatin .         ROS: Per HPI  Allergies  Allergen Reactions   Aggrenox [Aspirin -Dipyridamole Er] Anaphylaxis    Severe headache   Codeine Itching    Feet burn and agitation   Morphine  And Codeine Itching    Feet burn and agitation   Gabapentin  Other (See Comments)    Difficulty concentrating   Past Medical History:  Diagnosis Date   Anemia    takes Iron  pill daily   Arthritis    Carotid artery occlusion    Fibromyalgia    Fibromyalgia    Glaucoma    uses Eye Drops daily   Hip fracture requiring operative repair  (HCC) 10/17/2013   HOH (hard of hearing)    Hyperlipidemia    takes Atorvastatin  daily   Hypertension    takes Benazepril  daily   Hypothyroidism    takes Synthroid  daily   Macular degeneration    wet and gets injections in both eyes   Nocturia    Peripheral vascular disease (HCC)    Rectal incontinence    Stroke (HCC)    TIA (transient ischemic attack) 01/23/2014   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 10/28/2013    Current Outpatient Medications:    Accu-Chek Softclix Lancets lancets, Dx E11.9 Test BS daily, Disp: 100 each, Rfl: 3   Acetaminophen  325 MG CAPS, Take 325-600 mg by mouth every 6 (six) hours as needed (pain/headache)., Disp: , Rfl:    amLODipine  (NORVASC ) 10 MG tablet, Take 1 tablet (10 mg total) by mouth daily., Disp: 90 tablet, Rfl: 3   atorvastatin  (LIPITOR) 20 MG tablet, Take 1 tablet (20 mg total) by mouth daily., Disp: 90 tablet, Rfl: 3   benazepril  (LOTENSIN ) 20 MG tablet, Take 1 tablet (20 mg total) by mouth daily., Disp: 90 tablet, Rfl: 3   bimatoprost (LUMIGAN) 0.01 % SOLN, Place 1 drop into both eyes at bedtime., Disp: , Rfl:    clopidogrel  (PLAVIX ) 75 MG tablet, Take 1 tablet (75 mg total) by mouth daily., Disp: 90 tablet, Rfl: 3   ELIQUIS  2.5 MG TABS tablet, Take 1 tablet (2.5 mg total) by mouth 2 (two) times daily., Disp: 180 tablet, Rfl: 3   glucose blood (ACCU-CHEK  GUIDE) test strip, USE TO TEST BLOOD SUGAR BID DX E11.9, Disp: 200 strip, Rfl: 3   Lancets Misc. (ACCU-CHEK SOFTCLIX LANCET DEV) KIT, Dx E11.9 Test BS daily, Disp: 1 kit, Rfl: 1   levothyroxine  (SYNTHROID ) 75 MCG tablet, Take 1 tablet (75 mcg total) by mouth daily before breakfast., Disp: 90 tablet, Rfl: 3   metoprolol  succinate (TOPROL -XL) 50 MG 24 hr tablet, Take 1 tablet (50 mg total) by mouth daily. Bridge supply Take with or immediately following a meal., Disp: 90 tablet, Rfl: 3   Multiple Vitamin (MULTIVITAMIN WITH MINERALS) TABS tablet, Take 1 tablet by mouth daily.,  Disp: , Rfl:    Netarsudil Dimesylate (RHOPRESSA) 0.02 % SOLN, Place 1 drop into the left eye daily., Disp: , Rfl:    ZYLET 0.5-0.3 % SUSP, Place 1 drop into both eyes 3 (three) times daily., Disp: , Rfl:  Social History   Socioeconomic History   Marital status: Widowed    Spouse name: Not on file   Number of children: Not on file   Years of education: Not on file   Highest education level: Not on file  Occupational History   Not on file  Tobacco Use   Smoking status: Former   Smokeless tobacco: Never   Tobacco comments:    quit smoking 44yrs ago  Vaping Use   Vaping status: Never Used  Substance and Sexual Activity   Alcohol use: No    Alcohol/week: 0.0 standard drinks of alcohol   Drug use: No   Sexual activity: Yes    Birth control/protection: Post-menopausal  Other Topics Concern   Not on file  Social History Narrative   Not on file   Social Drivers of Health   Financial Resource Strain: Not on file  Food Insecurity: Not on file  Transportation Needs: Not on file  Physical Activity: Not on file  Stress: Not on file  Social Connections: Not on file  Intimate Partner Violence: Not on file   Family History  Problem Relation Age of Onset   Prostate cancer Father    Cancer Father    Lung disease Sister    Cancer Brother     Objective: Office vital signs reviewed. BP (!) 102/48   Pulse (!) 56   Temp 97.9 F (36.6 C)   Ht 5' 3 (1.6 m)   Wt 115 lb 6 oz (52.3 kg)   SpO2 99%   BMI 20.44 kg/m   Physical Examination:  General: Awake, alert, well nourished, No acute distress HEENT: MMM Cardio: irregularly irregular with rate control, S1S2 heard, no murmurs appreciated Pulm:  normal work of breathing on room air Neuro: hard of hearing but ambulating independently without difficulty  Assessment/ Plan: 89 y.o. female   Chronic mesenteric ischemia (HCC) - Plan: atorvastatin  (LIPITOR) 20 MG tablet, clopidogrel  (PLAVIX ) 75 MG tablet  Paroxysmal atrial  fibrillation (HCC) - Plan: metoprolol  succinate (TOPROL -XL) 50 MG 24 hr tablet  Hypothyroidism due to acquired atrophy of thyroid  - Plan: levothyroxine  (SYNTHROID ) 75 MCG tablet  Hypertension associated with diabetes (HCC) - Plan: amLODipine  (NORVASC ) 10 MG tablet, benazepril  (LOTENSIN ) 20 MG tablet  Assessment and Plan    Chronic mesenteric ischemia, post-vascular procedure Chronic mesenteric ischemia improved post-vascular stenting with resolution of symptoms. No longer requires omeprazole . - Discontinue omeprazole . - Continue clopidogrel  and atorvastatin . - Follow up with gastroenterology on September 16th. - Follow up with vascular surgery on October 21st.  Afib w. HTN Blood pressure stable at 124/50 mmHg.  Continue all meds  as prescribed.  Hypothyroidism not discussed but needed refills. TSH/FT4 in 9m     Flu shot administered.  Norene CHRISTELLA Fielding, DO [facility] [phone]

## 2023-11-20 ENCOUNTER — Ambulatory Visit: Admitting: Gastroenterology

## 2023-11-22 ENCOUNTER — Other Ambulatory Visit: Payer: Self-pay

## 2023-11-22 DIAGNOSIS — K551 Chronic vascular disorders of intestine: Secondary | ICD-10-CM

## 2023-12-08 ENCOUNTER — Other Ambulatory Visit: Payer: Self-pay | Admitting: Family Medicine

## 2023-12-11 ENCOUNTER — Encounter (INDEPENDENT_AMBULATORY_CARE_PROVIDER_SITE_OTHER): Admitting: Ophthalmology

## 2023-12-11 DIAGNOSIS — H353231 Exudative age-related macular degeneration, bilateral, with active choroidal neovascularization: Secondary | ICD-10-CM | POA: Diagnosis not present

## 2023-12-11 DIAGNOSIS — H35033 Hypertensive retinopathy, bilateral: Secondary | ICD-10-CM

## 2023-12-11 DIAGNOSIS — I10 Essential (primary) hypertension: Secondary | ICD-10-CM

## 2023-12-11 DIAGNOSIS — H43813 Vitreous degeneration, bilateral: Secondary | ICD-10-CM | POA: Diagnosis not present

## 2023-12-25 ENCOUNTER — Ambulatory Visit: Attending: Vascular Surgery | Admitting: Physician Assistant

## 2023-12-25 ENCOUNTER — Ambulatory Visit (HOSPITAL_COMMUNITY)
Admission: RE | Admit: 2023-12-25 | Discharge: 2023-12-25 | Disposition: A | Discharge status: Home / Self Care | Source: Ambulatory Visit | Attending: Vascular Surgery | Admitting: Vascular Surgery

## 2023-12-25 VITALS — BP 179/78 | Temp 97.8°F | Resp 18 | Ht 63.0 in | Wt 117.1 lb

## 2023-12-25 DIAGNOSIS — K551 Chronic vascular disorders of intestine: Secondary | ICD-10-CM

## 2023-12-25 NOTE — Progress Notes (Signed)
 Office Note   History of Present Illness   Jenna Ramos is a 89 y.o. (1928-05-29) female who presents for follow up.  She recently underwent mesenteric angiogram with SMA angioplasty and stent placement on 11/01/2023 by Dr. Gretta.  This was done for chronic mesenteric ischemia with high-grade SMA stenosis.  Prior to intervention, her symptoms included postprandial abdominal pain and accompanying lightheadedness and dizziness.  She did have weight loss of about 4 pounds over the summer.  She returns today for follow-up.  She says that she feels great without any issues.  She says that all of her previous symptoms, including abdominal pain, lightheadedness, and dizziness has completely gone away.  She is able to eat her normal diet without any pain.  Current Outpatient Medications  Medication Sig Dispense Refill   Accu-Chek Softclix Lancets lancets Dx E11.9 Test BS daily 100 each 3   Acetaminophen  325 MG CAPS Take 325-600 mg by mouth every 6 (six) hours as needed (pain/headache).     amLODipine  (NORVASC ) 10 MG tablet Take 1 tablet (10 mg total) by mouth daily. 90 tablet 3   atorvastatin  (LIPITOR) 20 MG tablet Take 1 tablet (20 mg total) by mouth daily. 90 tablet 3   benazepril  (LOTENSIN ) 20 MG tablet Take 1 tablet (20 mg total) by mouth daily. 90 tablet 3   bimatoprost (LUMIGAN) 0.01 % SOLN Place 1 drop into both eyes at bedtime.     clopidogrel  (PLAVIX ) 75 MG tablet Take 1 tablet (75 mg total) by mouth daily. 90 tablet 3   ELIQUIS  2.5 MG TABS tablet TAKE 1 TABLET TWICE A DAY 180 tablet 1   glucose blood (ACCU-CHEK GUIDE) test strip USE TO TEST BLOOD SUGAR BID DX E11.9 200 strip 3   Lancets Misc. (ACCU-CHEK SOFTCLIX LANCET DEV) KIT Dx E11.9 Test BS daily 1 kit 1   levothyroxine  (SYNTHROID ) 75 MCG tablet Take 1 tablet (75 mcg total) by mouth daily before breakfast. 90 tablet 3   metoprolol  succinate (TOPROL -XL) 50 MG 24 hr tablet Take 1 tablet (50 mg total) by mouth daily. Bridge  supply Take with or immediately following a meal. 90 tablet 3   Multiple Vitamin (MULTIVITAMIN WITH MINERALS) TABS tablet Take 1 tablet by mouth daily.     Netarsudil Dimesylate (RHOPRESSA) 0.02 % SOLN Place 1 drop into the left eye daily.     ZYLET 0.5-0.3 % SUSP Place 1 drop into both eyes 3 (three) times daily.     No current facility-administered medications for this visit.    REVIEW OF SYSTEMS (negative unless checked):   Cardiac:  []  Chest pain or chest pressure? []  Shortness of breath upon activity? []  Shortness of breath when lying flat? []  Irregular heart rhythm?  Vascular:  []  Pain in calf, thigh, or hip brought on by walking? []  Pain in feet at night that wakes you up from your sleep? []  Blood clot in your veins? []  Leg swelling?  Pulmonary:  []  Oxygen at home? []  Productive cough? []  Wheezing?  Neurologic:  []  Sudden weakness in arms or legs? []  Sudden numbness in arms or legs? []  Sudden onset of difficult speaking or slurred speech? []  Temporary loss of vision in one eye? []  Problems with dizziness?  Gastrointestinal:  []  Blood in stool? []  Vomited blood?  Genitourinary:  []  Burning when urinating? []  Blood in urine?  Psychiatric:  []  Major depression  Hematologic:  []  Bleeding problems? []  Problems with blood clotting?  Dermatologic:  []  Rashes or ulcers?  Constitutional:  []  Fever or chills?  Ear/Nose/Throat:  []  Change in hearing? []  Nose bleeds? []  Sore throat?  Musculoskeletal:  []  Back pain? []  Joint pain? []  Muscle pain?   Physical Examination   Vitals:   12/25/23 1009  BP: (!) 179/78  Resp: 18  Temp: 97.8 F (36.6 C)  TempSrc: Temporal  Weight: 117 lb 1.6 oz (53.1 kg)  Height: 5' 3 (1.6 m)   Body mass index is 20.74 kg/m.  General:  WDWN in NAD; vital signs documented above Gait: Not observed HENT: WNL, normocephalic Pulmonary: normal non-labored breathing , without rales, rhonchi,  wheezing Cardiac:  Regular Abdomen: soft, NT, no masses Skin: without rashes Vascular Exam/Pulses: BLE warm and well-perfused Extremities: without ischemic changes, without gangrene , without cellulitis; without open wounds;  Musculoskeletal: no muscle wasting or atrophy  Neurologic: A&O X 3;  No focal weakness or paresthesias are detected Psychiatric:  The pt has Normal affect.   Non-Invasive Vascular Imaging   Mesenteric Duplex (12/25/2023) +----------------------+--------+--------+------+------------------+  Mesenteric           PSV cm/sEDV cm/sPlaque     Comments       +----------------------+--------+--------+------+------------------+  Aorta Prox               90                                     +----------------------+--------+--------+------+------------------+  Aorta Distal             98                                     +----------------------+--------+--------+------+------------------+  Celiac Artery Origin    184                                     +----------------------+--------+--------+------+------------------+  Celiac Artery Proximal  231                                     +----------------------+--------+--------+------+------------------+  SMA Origin              291      24                             +----------------------+--------+--------+------+------------------+  SMA Proximal            427      34         plaque visualized.  +----------------------+--------+--------+------+------------------+  SMA Mid                 217      21                             +----------------------+--------+--------+------+------------------+  SMA Distal              213      10                             +----------------------+--------+--------+------+------------------+  [facility]  117      9                              +----------------------+--------+--------+------+------------------+   SMA stent could  not be visualized  Medical Decision Making   Jenna Ramos is a 89 y.o. (07/27/1928) female who presents post angio follow-up  The patient recently underwent SMA angioplasty and stenting on 11/01/2023 by Dr. Gretta for chronic mesenteric ischemia The patient's SMA stent could not be visualized today on duplex.  Her mesenteric stenosis appears unchanged compared to her ultrasound prior to stenting.  There appears to be high-grade stenosis of the proximal SFA, with a PSV of 427 cm/s She says that she feels 100% better after her intervention.  She is tolerating a normal diet without any nausea or abdominal pain.  She says that her lightheadedness and dizziness has also gone away.  Overall she has no complaints. On exam her abdomen is soft and nontender.  Her right groin access site is well-healed. I have explained to the patient and her son that the duplex does not show any significant change in her SMA stenosis.  Unfortunately her stent could not be visualized today.  Potentially this could be due to restenosis versus poor visualization on ultrasound.  I have discussed the results with Dr. Gretta.  Given that the patient remains symptom-free, we will follow this closely rather than pursue CTA. She can follow-up with Dr. Gretta in 3 months with a repeat mesenteric duplex.  She will call our office if any of her symptoms return.  She is aware that if her repeat mesenteric duplex still shows high-grade SMA stenosis, she will likely require repeat CTA   Ahmed Holster PA-C [facility] Office: [phone]  Clinic MD: Gretta

## 2023-12-26 ENCOUNTER — Other Ambulatory Visit: Payer: Self-pay

## 2023-12-26 DIAGNOSIS — K551 Chronic vascular disorders of intestine: Secondary | ICD-10-CM

## 2024-01-01 ENCOUNTER — Encounter: Admitting: Family Medicine

## 2024-01-09 ENCOUNTER — Ambulatory Visit: Admitting: Cardiology

## 2024-01-24 ENCOUNTER — Encounter (INDEPENDENT_AMBULATORY_CARE_PROVIDER_SITE_OTHER): Admitting: Ophthalmology

## 2024-01-24 DIAGNOSIS — H353231 Exudative age-related macular degeneration, bilateral, with active choroidal neovascularization: Secondary | ICD-10-CM | POA: Diagnosis not present

## 2024-01-24 DIAGNOSIS — I10 Essential (primary) hypertension: Secondary | ICD-10-CM

## 2024-01-24 DIAGNOSIS — H43813 Vitreous degeneration, bilateral: Secondary | ICD-10-CM

## 2024-01-24 DIAGNOSIS — H35033 Hypertensive retinopathy, bilateral: Secondary | ICD-10-CM

## 2024-03-11 ENCOUNTER — Encounter (INDEPENDENT_AMBULATORY_CARE_PROVIDER_SITE_OTHER): Admitting: Ophthalmology

## 2024-03-11 DIAGNOSIS — I10 Essential (primary) hypertension: Secondary | ICD-10-CM

## 2024-03-11 DIAGNOSIS — H35033 Hypertensive retinopathy, bilateral: Secondary | ICD-10-CM | POA: Diagnosis not present

## 2024-03-11 DIAGNOSIS — H43813 Vitreous degeneration, bilateral: Secondary | ICD-10-CM

## 2024-03-11 DIAGNOSIS — H353231 Exudative age-related macular degeneration, bilateral, with active choroidal neovascularization: Secondary | ICD-10-CM

## 2024-03-31 ENCOUNTER — Telehealth: Payer: Self-pay | Admitting: Vascular Surgery

## 2024-04-01 ENCOUNTER — Ambulatory Visit (HOSPITAL_COMMUNITY)

## 2024-04-01 ENCOUNTER — Ambulatory Visit: Admitting: Vascular Surgery

## 2024-04-09 ENCOUNTER — Other Ambulatory Visit: Payer: Self-pay | Admitting: Family Medicine

## 2024-04-09 DIAGNOSIS — I495 Sick sinus syndrome: Secondary | ICD-10-CM

## 2024-04-09 NOTE — Progress Notes (Signed)
 Tachy brady syndrome on Zio, will refer to EP

## 2024-04-10 ENCOUNTER — Telehealth: Payer: Self-pay | Admitting: Family Medicine

## 2024-04-10 NOTE — Telephone Encounter (Unsigned)
 Copied from CRM #[id number]. Topic: Appointments - Scheduling Inquiry for Clinic >> Apr 10, 2024 11:04 AM Tobias CROME wrote: Reason for CRM: Patient requesting clarification on appointment with Dr. Nancey at [facility] at [facility]. Patient would like to know why appointment was scheduled for her.   Best call back number: [phone]

## 2024-04-10 NOTE — Telephone Encounter (Signed)
 Called and spoke with patient advised with what provider said.

## 2024-04-22 ENCOUNTER — Encounter (INDEPENDENT_AMBULATORY_CARE_PROVIDER_SITE_OTHER): Admitting: Ophthalmology

## 2024-04-24 ENCOUNTER — Ambulatory Visit (HOSPITAL_COMMUNITY)

## 2024-04-25 ENCOUNTER — Ambulatory Visit: Admitting: Cardiovascular Disease

## 2024-04-29 ENCOUNTER — Ambulatory Visit: Admitting: Vascular Surgery

## 2024-05-20 ENCOUNTER — Ambulatory Visit (HOSPITAL_COMMUNITY)

## 2024-05-20 ENCOUNTER — Ambulatory Visit: Admitting: Vascular Surgery

## 2024-05-21 ENCOUNTER — Encounter: Payer: Self-pay | Admitting: Family Medicine
